# Patient Record
Sex: Male | Born: 1945 | Race: White | Hispanic: No | Marital: Married | Smoking: Former smoker
Health system: Southern US, Community
[De-identification: ages and names within clinical notes are randomized; demographics above are authoritative.]

## PROBLEM LIST (undated history)

## (undated) DIAGNOSIS — C61 Malignant neoplasm of prostate: Secondary | ICD-10-CM

## (undated) DIAGNOSIS — E119 Type 2 diabetes mellitus without complications: Secondary | ICD-10-CM

## (undated) DIAGNOSIS — L03115 Cellulitis of right lower limb: Secondary | ICD-10-CM

## (undated) DIAGNOSIS — N189 Chronic kidney disease, unspecified: Secondary | ICD-10-CM

## (undated) DIAGNOSIS — I10 Essential (primary) hypertension: Secondary | ICD-10-CM

## (undated) DIAGNOSIS — C801 Malignant (primary) neoplasm, unspecified: Secondary | ICD-10-CM

## (undated) DIAGNOSIS — I700 Atherosclerosis of aorta: Secondary | ICD-10-CM

## (undated) DIAGNOSIS — E785 Hyperlipidemia, unspecified: Secondary | ICD-10-CM

## (undated) DIAGNOSIS — I712 Thoracic aortic aneurysm, without rupture: Secondary | ICD-10-CM

## (undated) HISTORY — DX: Cellulitis of right lower limb: L03.115

## (undated) HISTORY — DX: Chronic kidney disease, unspecified: N18.9

## (undated) HISTORY — DX: Essential (primary) hypertension: I10

## (undated) HISTORY — DX: Hyperlipidemia, unspecified: E78.5

## (undated) HISTORY — DX: Type 2 diabetes mellitus without complications: E11.9

---

## 1998-03-02 ENCOUNTER — Emergency Department (HOSPITAL_COMMUNITY): Admission: EM | Admit: 1998-03-02 | Discharge: 1998-03-02 | Payer: Self-pay | Admitting: Emergency Medicine

## 2003-09-09 ENCOUNTER — Encounter: Admission: RE | Admit: 2003-09-09 | Discharge: 2003-09-09 | Payer: Self-pay | Admitting: Family Medicine

## 2003-10-11 ENCOUNTER — Ambulatory Visit (HOSPITAL_COMMUNITY): Admission: RE | Admit: 2003-10-11 | Discharge: 2003-10-11 | Payer: Self-pay | Admitting: Orthopedic Surgery

## 2003-10-11 ENCOUNTER — Ambulatory Visit (HOSPITAL_BASED_OUTPATIENT_CLINIC_OR_DEPARTMENT_OTHER): Admission: RE | Admit: 2003-10-11 | Discharge: 2003-10-11 | Payer: Self-pay | Admitting: Orthopedic Surgery

## 2005-08-30 ENCOUNTER — Encounter: Admission: RE | Admit: 2005-08-30 | Discharge: 2005-08-30 | Payer: Self-pay | Admitting: Family Medicine

## 2006-03-07 ENCOUNTER — Ambulatory Visit: Payer: Self-pay | Admitting: Family Medicine

## 2006-06-06 ENCOUNTER — Ambulatory Visit: Payer: Self-pay | Admitting: Family Medicine

## 2006-06-06 LAB — CONVERTED CEMR LAB
ALT: 29 units/L (ref 0–40)
AST: 29 units/L (ref 0–37)
BUN: 13 mg/dL (ref 6–23)
CO2: 27 meq/L (ref 19–32)
Calcium: 9.3 mg/dL (ref 8.4–10.5)
Chloride: 108 meq/L (ref 96–112)
Chol/HDL Ratio, serum: 4.9
Cholesterol: 184 mg/dL (ref 0–200)
Creatinine, Ser: 0.9 mg/dL (ref 0.4–1.5)
Creatinine,U: 148.2 mg/dL
GFR calc non Af Amer: 92 mL/min
Glomerular Filtration Rate, Af Am: 112 mL/min/{1.73_m2}
Glucose, Bld: 102 mg/dL — ABNORMAL HIGH (ref 70–99)
HDL: 37.9 mg/dL — ABNORMAL LOW (ref >39.0)
Hgb A1c MFr Bld: 7 % — ABNORMAL HIGH (ref 4.6–6.0)
LDL Cholesterol: 107 mg/dL — ABNORMAL HIGH (ref 0–99)
Microalb Creat Ratio: 18.2 mg/g (ref 0.0–30.0)
Microalb, Ur: 2.7 mg/dL — ABNORMAL HIGH (ref 0.0–1.9)
PSA: 1.61 ng/mL (ref 0.10–4.00)
Potassium: 3.9 meq/L (ref 3.5–5.1)
Sodium: 141 meq/L (ref 135–145)
Testosterone, total: 2.6969 ng/mL — ABNORMAL LOW
Triglyceride fasting, serum: 197 mg/dL — ABNORMAL HIGH (ref 0–149)
VLDL: 39 mg/dL (ref 0–40)

## 2006-06-27 ENCOUNTER — Ambulatory Visit: Payer: Self-pay | Admitting: Family Medicine

## 2006-07-08 ENCOUNTER — Ambulatory Visit: Payer: Self-pay | Admitting: Family Medicine

## 2006-10-06 DIAGNOSIS — E119 Type 2 diabetes mellitus without complications: Secondary | ICD-10-CM | POA: Insufficient documentation

## 2006-10-06 DIAGNOSIS — E785 Hyperlipidemia, unspecified: Secondary | ICD-10-CM | POA: Insufficient documentation

## 2006-10-06 DIAGNOSIS — I10 Essential (primary) hypertension: Secondary | ICD-10-CM | POA: Insufficient documentation

## 2006-10-06 DIAGNOSIS — D239 Other benign neoplasm of skin, unspecified: Secondary | ICD-10-CM | POA: Insufficient documentation

## 2006-10-06 DIAGNOSIS — Z9089 Acquired absence of other organs: Secondary | ICD-10-CM | POA: Insufficient documentation

## 2006-10-06 DIAGNOSIS — S83289A Other tear of lateral meniscus, current injury, unspecified knee, initial encounter: Secondary | ICD-10-CM | POA: Insufficient documentation

## 2006-12-16 ENCOUNTER — Encounter (INDEPENDENT_AMBULATORY_CARE_PROVIDER_SITE_OTHER): Payer: Self-pay | Admitting: Family Medicine

## 2006-12-20 ENCOUNTER — Telehealth (INDEPENDENT_AMBULATORY_CARE_PROVIDER_SITE_OTHER): Payer: Self-pay | Admitting: Family Medicine

## 2006-12-20 LAB — CONVERTED CEMR LAB
Sex Hormone Binding: 27 nmol/L (ref 13–71)
Testosterone Free: 58.2 pg/mL (ref 47.0–244.0)
Testosterone-% Free: 2.2 % (ref 1.6–2.9)
Testosterone: 267.16 ng/dL — ABNORMAL LOW (ref 350–890)

## 2008-09-10 ENCOUNTER — Ambulatory Visit: Admission: RE | Admit: 2008-09-10 | Discharge: 2008-10-24 | Payer: Self-pay | Admitting: Radiation Oncology

## 2008-10-28 ENCOUNTER — Encounter (INDEPENDENT_AMBULATORY_CARE_PROVIDER_SITE_OTHER): Payer: Self-pay | Admitting: Urology

## 2008-10-28 ENCOUNTER — Inpatient Hospital Stay (HOSPITAL_COMMUNITY): Admission: RE | Admit: 2008-10-28 | Discharge: 2008-10-29 | Payer: Self-pay | Admitting: Urology

## 2010-01-17 ENCOUNTER — Emergency Department (HOSPITAL_BASED_OUTPATIENT_CLINIC_OR_DEPARTMENT_OTHER): Admission: EM | Admit: 2010-01-17 | Discharge: 2010-01-17 | Payer: Self-pay | Admitting: Emergency Medicine

## 2010-01-17 ENCOUNTER — Ambulatory Visit: Payer: Self-pay | Admitting: Diagnostic Radiology

## 2010-08-11 NOTE — Progress Notes (Signed)
  Phone Note Outgoing Call   Call placed by: Carley Hammed,  December 20, 2006 8:53 AM Call placed to: Patient Summary of Call: [facility] PHONE,.  Follow-up for Phone Call        SPOKE WITH PT. AND IS AWARE.  ALSO, PT. KNOWS THAT DR. Lashala Laser IS OUT OF OFFICE AND WILL TAKE A LOOK AT THE LABS AND IF ANY THING ELSE NEEDS TO BE DONE I WILL GIVE HIM A CALL. Follow-up by: Carley Hammed,  December 20, 2006 8:56 AM  Additional Follow-up for Phone Call Additional follow up Details #1::        No change regarding testosterone labs Additional Follow-up by: Leone Haven MD,  December 25, 2006 6:42 PM   Additional Follow-up for Phone Call Additional follow up Details #2::    Pt. aware. ...................................................................Carley Hammed  December 26, 2006 9:02 AM  Follow-up by: Carley Hammed,  December 26, 2006 9:02 AM

## 2010-10-21 LAB — GLUCOSE, CAPILLARY
Glucose-Capillary: 125 mg/dL — ABNORMAL HIGH (ref 70–99)
Glucose-Capillary: 140 mg/dL — ABNORMAL HIGH (ref 70–99)
Glucose-Capillary: 141 mg/dL — ABNORMAL HIGH (ref 70–99)
Glucose-Capillary: 150 mg/dL — ABNORMAL HIGH (ref 70–99)
Glucose-Capillary: 157 mg/dL — ABNORMAL HIGH (ref 70–99)
Glucose-Capillary: 163 mg/dL — ABNORMAL HIGH (ref 70–99)
Glucose-Capillary: 164 mg/dL — ABNORMAL HIGH (ref 70–99)
Glucose-Capillary: 176 mg/dL — ABNORMAL HIGH (ref 70–99)
Glucose-Capillary: 183 mg/dL — ABNORMAL HIGH (ref 70–99)
Glucose-Capillary: 185 mg/dL — ABNORMAL HIGH (ref 70–99)
Glucose-Capillary: 191 mg/dL — ABNORMAL HIGH (ref 70–99)
Glucose-Capillary: 202 mg/dL — ABNORMAL HIGH (ref 70–99)
Glucose-Capillary: 258 mg/dL — ABNORMAL HIGH (ref 70–99)

## 2010-10-21 LAB — CBC
HCT: 43.8 % (ref 39.0–52.0)
Hemoglobin: 15.2 g/dL (ref 13.0–17.0)
MCHC: 34.6 g/dL (ref 30.0–36.0)
MCV: 96.3 fL (ref 78.0–100.0)
Platelets: 172 10*3/uL (ref 150–400)
RBC: 4.55 MIL/uL (ref 4.22–5.81)
RDW: 13.1 % (ref 11.5–15.5)
WBC: 6.2 10*3/uL (ref 4.0–10.5)

## 2010-10-21 LAB — HEMOGLOBIN AND HEMATOCRIT, BLOOD
HCT: 38.6 % — ABNORMAL LOW (ref 39.0–52.0)
HCT: 40.7 % (ref 39.0–52.0)
Hemoglobin: 13.5 g/dL (ref 13.0–17.0)
Hemoglobin: 14.1 g/dL (ref 13.0–17.0)

## 2010-10-21 LAB — BASIC METABOLIC PANEL
BUN: 19 mg/dL (ref 6–23)
CO2: 31 mEq/L (ref 19–32)
Calcium: 9.4 mg/dL (ref 8.4–10.5)
Chloride: 96 mEq/L (ref 96–112)
Creatinine, Ser: 0.85 mg/dL (ref 0.4–1.5)
GFR calc Af Amer: >60 mL/min (ref >60)
GFR calc non Af Amer: >60 mL/min (ref >60)
Glucose, Bld: 181 mg/dL — ABNORMAL HIGH (ref 70–99)
Potassium: 3.8 mEq/L (ref 3.5–5.1)
Sodium: 137 mEq/L (ref 135–145)

## 2010-10-21 LAB — TYPE AND SCREEN
ABO/RH(D): B POS
Antibody Screen: NEGATIVE

## 2010-10-21 LAB — ABO/RH: ABO/RH(D): B POS

## 2010-11-24 NOTE — Op Note (Signed)
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   PROCEDURE:  1. Robotic-assisted laparoscopic radical prostatectomy (left-nerve      sparing).  2. Bilateral laparoscopic pelvic lymphadenectomy.   SURGEON:  Raynelle Bring, MD.   ASSISTANT:  Franco Collet, NP.   ANESTHESIA:  General.   COMPLICATIONS:  None.   ESTIMATED BLOOD LOSS:  175 mL.   INTRAVENOUS FLUIDS:  2000 mL of lactated Ringer's.   SPECIMENS:  1. Prostate and seminal vesicles.  2. Right pelvic lymph nodes.  3. Left pelvic lymph nodes.   DISPOSITION OF SPECIMENS:  To pathology.   DRAINS:  1. A 20-French Coude catheter.  2. A #19-Blake pelvic drain.   INDICATION:  Darrell [name] is a 65 year old gentleman with clinically  localized adenocarcinoma of the prostate.  After discussion regarding  management options for treatment, he elected to proceed with surgical  therapy and the above procedures.  The potential risks, complications,  and alternative treatment options were discussed in detail and informed  consent was obtained.   DESCRIPTION OF PROCEDURE:  The patient was taken to the operating room  and a general anesthetic was administered.  He was given preoperative  antibiotics, placed in the dorsal lithotomy position, and prepped and  draped in the usual sterile fashion.  Next, a preoperative time-out was  performed.  A Foley catheter was then inserted into the bladder.  A site  was selected just to the left of  the umbilicus for placement of the  camera port.  This was placed using a standard open-Hassan technique  which allowed entry into the peritoneal cavity under direct vision and  without difficulty.  A 12-mm port was then placed and a pneumoperitoneum  established.  The 0-degree lens was used to inspect the abdomen and  there were noted to be some adhesions between the omentum and the right  abdominal wall.  The left-sided ports were then placed with two 8-mm  ports placed in the left lower quadrant.  A 5-mm port was then placed in  the right upper quadrant and the previously mentioned adhesions were  taken down with laparoscopic scissors.  This exposed the right abdominal  wall and an additional 8-mm robotic port, as well as a 12-mm port were  placed in the right lower quadrant.  All ports were placed under direct  vision and without difficulty.  The surgical cart was then docked.  With  the aid of the cautery scissors, the bladder was reflected posteriorly  allowing entry into the space of Retzius and identification of the  endopelvic fascia  and prostate.  The endopelvic fascia was then incised  from the apex back to the base of the prostate bilaterally and the  underlying levator muscle fibers were swept laterally off the prostate,  thereby isolating the dorsal venous complex.  The dorsal vein was then  stapled and divided with a 45-mm flex ETS stapler.  The bladder neck was  identified with the aid of Foley catheter manipulation and was divided  anteriorly.  This exposed the Foley catheter.  The catheter balloon was  deflated and the catheter was brought into the operative field and used  to retract the prostate anteriorly.  On inspection of the posterior  bladder neck, there was noted to be a small median lobe.  This was  excised and dissection proceeded between the bladder and prostate until  the vasa deferentia and seminal vesicles were identified.  The seminal  vesicles were  identified and dissected down to their tips with care to  control the seminal vesicle arterial blood supply.  The vasa deferentia  were also isolated and divided and all of the structures were then  lifted anteriorly.  The space between the Denonvilliers fascia and the  anterior rectum was then bluntly developed, thereby isolating the  vascular pedicles of the prostate.  The lateral prostatic fascia on the  left side of prostate was then incised allowing the neurovascular bundle  to be released.  A nerve-sparing procedure was, therefore, performed on  this side with Hem-o-lok clips used to ligate the vascular pedicles of  the prostate and the vascular pedicles were then divided with sharp cold  scissor dissection above the level of the neurovascular bundles.  The  neurovascular bundle was swept off the apex of the prostate and urethra  on the left side.  On the right side, a wide non-nerve-sparing procedure  was performed with Hem-o-lok clips placed widely on the vascular pedicle  of the prostate.  The urethra was then identified and sharply divided  allowing the specimen to be disarticulated.  The pelvis was copiously  irrigated and hemostasis was ensured.  There was no evidence of a rectal  injury.  Attention then turned to the right pelvic sidewall.  The  fibrofatty tissue between the external iliac vein, confluence of the  iliac vessels, hypogastric artery, and Cooper's ligament was dissected  free from the pelvic sidewall with care to preserve the obturator nerve.  Hem-o-lok clips were used for lymphostasis and hemostasis.  The specimen  was then passed off for permanent pathologic analysis.  An identical  procedure was then performed on the contralateral side.  Attention then  returned to the pelvis and the urethral anastomosis.  A 2-0 Vicryl  slipknot was placed at the 6 o'clock position between Denonvilliers  fascia, the posterior bladder neck, and the posterior urethra to   reapproximate these structures.  A double-armed 3-0 Monocryl suture was  then used to perform a 360-degree running tension-free anastomosis  between the bladder neck and urethra.  A new 20-French Coude catheter  was then inserted into the bladder and irrigated.  There were no blood  clots within the bladder and the anastomosis appeared to be watertight.  A #19-Blake drain was then brought through the left robotic port and  appropriately positioned in the pelvis.  It was secured to the skin with  a nylon suture.  The surgical cart was then undocked.  The right lateral  12-mm port site was closed with a 0-Vicryl suture, placed with the aid  of the suture passer device.  All remaining ports were removed under  direct vision.  The prostate specimen was removed intact within the  Endopouch retrieval bag.  The fascial opening at the periumbilical port  site was then  closed with a running 0-Vicryl suture.  All port sites were injected  with 0.25 % Marcaine and reapproximated at the skin level with staples.  Sterile dressings were applied.  The patient appeared to tolerate the  procedure well and without complications.  He was able to be extubated  and transferred to the recovery unit in satisfactory condition.      Raynelle Bring, MD  Electronically Signed     LB/MEDQ  D:  10/28/2008  T:  10/28/2008  Job:  [id number]

## 2010-11-27 NOTE — Op Note (Signed)
NAME:  Darrell [name], Darrell [name]                          ACCOUNT NO.:  [id number]   MEDICAL RECORD NO.:  [id number]                   PATIENT TYPE:  AMB   LOCATION:  DSC                                  FACILITY:  [facility]   PHYSICIAN:  Yvette Rack., M.D.             DATE OF BIRTH:  1945-11-23   DATE OF PROCEDURE:  10/11/2003  DATE OF DISCHARGE:                                 OPERATIVE REPORT   INDICATIONS FOR PROCEDURE:  65 year old male with MRI proven symptomatic  right medial meniscal tear thought to be amenable to outpatient surgery.   PREOPERATIVE DIAGNOSIS:  1. Grade 3 chondromalacia of the patella.  2. Medial meniscus tear.  3. Early grade 3 changes medial compartment.   POSTOPERATIVE DIAGNOSIS:  1. Grade 3 chondromalacia of the patella.  2. Medial meniscus tear.  3. Early grade 3 changes medial compartment.   OPERATION:  1. Partial medial meniscectomy (posterior 30-40%).  2. Debridement chondroplasty of patellofemoral joint, medial compartment.   SURGEON:  Lockie Pares, M.D.   ANESTHESIA:  MAC/general.   DESCRIPTION OF PROCEDURE:  Arthroscope through an inferomedial and  inferolateral portal.  The patient had mild to moderate grade 3 changes of  the patella which was debrided separate from the medial compartment, lateral  compartment, lateral meniscus, ACL and PCL normal.  Complex tear of the  posterior horn of the medial meniscus requiring resection of 30-40% of the  meniscus substance.  Mild degenerative change of the compartment, probably  early grade 3 changes on the tibia and femur.  These were very early grade 3  changes, these were debrided, as well.  A medial meniscectomy was carried  out, basket forceps, motorized shaving instruments. The knee drained clear  of fluid.  The portals were closed with nylon.  The knee was infiltrated  with Marcaine and morphine with addition of 40 mg Depo-Medrol.  Taken to the  recovery room in stable condition.                                 Yvette Rack., M.D.    WDC/MEDQ  D:  10/11/2003  T:  10/11/2003  Job:  [id number]

## 2016-12-01 ENCOUNTER — Ambulatory Visit: Payer: Self-pay | Admitting: Physical Therapy

## 2018-02-01 ENCOUNTER — Encounter: Payer: Self-pay | Admitting: *Deleted

## 2018-02-01 ENCOUNTER — Telehealth: Payer: Self-pay | Admitting: *Deleted

## 2018-02-01 DIAGNOSIS — R918 Other nonspecific abnormal finding of lung field: Secondary | ICD-10-CM

## 2018-02-01 NOTE — Telephone Encounter (Signed)
Oncology Nurse Navigator Documentation  Oncology Nurse Navigator Flowsheets 02/01/2018  Navigator Location [facility]  Navigator Encounter Type Telephone/I called Darrell [name].  I updated him on appt to see Dr. [name] on 7/30 labs at 1:00 and see doctor at 1:30.  He verbalized understanding of appt time and place.   Treatment Phase Abnormal Scans  Barriers/Navigation Needs Education;Coordination of Care  Education Other  Interventions Coordination of Care;Education  Coordination of Care Appts  Education Method Verbal  Acuity Level 2  Time Spent with Patient 30

## 2018-02-01 NOTE — Progress Notes (Signed)
Oncology Nurse Navigator Documentation  Oncology Nurse Navigator Flowsheets 02/01/2018  Navigator Location [facility]  Referral date to RadOnc/MedOnc 02/01/2018  Navigator Encounter Type Telephone/Dr. [name] received a referral on Darrell [name] today from [facility].  Dr. [name] would like to see patient on 02/07/18 at 1:30.  I called today but was unable to reach. I did leave vm message for him to call me with my name and phone number.   Treatment Phase Abnormal Scans  Barriers/Navigation Needs Coordination of Care  Interventions Coordination of Care  Coordination of Care Other  Acuity Level 2  Time Spent with Patient 30

## 2018-02-07 ENCOUNTER — Encounter: Payer: Self-pay | Admitting: Internal Medicine

## 2018-02-07 ENCOUNTER — Inpatient Hospital Stay (HOSPITAL_BASED_OUTPATIENT_CLINIC_OR_DEPARTMENT_OTHER): Payer: Medicare Other | Admitting: Internal Medicine

## 2018-02-07 ENCOUNTER — Inpatient Hospital Stay: Payer: Medicare Other | Attending: Internal Medicine

## 2018-02-07 ENCOUNTER — Telehealth: Payer: Self-pay | Admitting: Internal Medicine

## 2018-02-07 DIAGNOSIS — I129 Hypertensive chronic kidney disease with stage 1 through stage 4 chronic kidney disease, or unspecified chronic kidney disease: Secondary | ICD-10-CM | POA: Diagnosis not present

## 2018-02-07 DIAGNOSIS — E1122 Type 2 diabetes mellitus with diabetic chronic kidney disease: Secondary | ICD-10-CM | POA: Insufficient documentation

## 2018-02-07 DIAGNOSIS — C3411 Malignant neoplasm of upper lobe, right bronchus or lung: Secondary | ICD-10-CM | POA: Diagnosis not present

## 2018-02-07 DIAGNOSIS — N189 Chronic kidney disease, unspecified: Secondary | ICD-10-CM

## 2018-02-07 DIAGNOSIS — E785 Hyperlipidemia, unspecified: Secondary | ICD-10-CM | POA: Insufficient documentation

## 2018-02-07 DIAGNOSIS — Z87891 Personal history of nicotine dependence: Secondary | ICD-10-CM | POA: Insufficient documentation

## 2018-02-07 DIAGNOSIS — C3491 Malignant neoplasm of unspecified part of right bronchus or lung: Secondary | ICD-10-CM

## 2018-02-07 DIAGNOSIS — R918 Other nonspecific abnormal finding of lung field: Secondary | ICD-10-CM

## 2018-02-07 DIAGNOSIS — Z7189 Other specified counseling: Secondary | ICD-10-CM | POA: Insufficient documentation

## 2018-02-07 DIAGNOSIS — C3490 Malignant neoplasm of unspecified part of unspecified bronchus or lung: Secondary | ICD-10-CM

## 2018-02-07 LAB — CBC WITH DIFFERENTIAL (CANCER CENTER ONLY)
Basophils Absolute: 0.1 10*3/uL (ref 0.0–0.1)
Basophils Relative: 2 %
Eosinophils Absolute: 0.5 10*3/uL (ref 0.0–0.5)
Eosinophils Relative: 9 %
HCT: 32.1 % — ABNORMAL LOW (ref 38.4–49.9)
Hemoglobin: 10.6 g/dL — ABNORMAL LOW (ref 13.0–17.1)
Lymphocytes Relative: 22 %
Lymphs Abs: 1.3 10*3/uL (ref 0.9–3.3)
MCH: 30.6 pg (ref 27.2–33.4)
MCHC: 33 g/dL (ref 32.0–36.0)
MCV: 92.8 fL (ref 79.3–98.0)
Monocytes Absolute: 0.5 10*3/uL (ref 0.1–0.9)
Monocytes Relative: 8 %
Neutro Abs: 3.5 10*3/uL (ref 1.5–6.5)
Neutrophils Relative %: 59 %
Platelet Count: 249 10*3/uL (ref 140–400)
RBC: 3.46 MIL/uL — ABNORMAL LOW (ref 4.20–5.82)
RDW: 15.1 % — ABNORMAL HIGH (ref 11.0–14.6)
WBC Count: 6 10*3/uL (ref 4.0–10.3)

## 2018-02-07 LAB — CMP (CANCER CENTER ONLY)
ALT: 10 U/L (ref 0–44)
AST: 16 U/L (ref 15–41)
Albumin: 3.7 g/dL (ref 3.5–5.0)
Alkaline Phosphatase: 41 U/L (ref 38–126)
Anion gap: 9 (ref 5–15)
BUN: 32 mg/dL — ABNORMAL HIGH (ref 8–23)
CO2: 28 mmol/L (ref 22–32)
Calcium: 9.9 mg/dL (ref 8.9–10.3)
Chloride: 100 mmol/L (ref 98–111)
Creatinine: 1.57 mg/dL — ABNORMAL HIGH (ref 0.61–1.24)
GFR, Est AFR Am: 50 mL/min — ABNORMAL LOW (ref >60)
GFR, Estimated: 43 mL/min — ABNORMAL LOW (ref >60)
Glucose, Bld: 96 mg/dL (ref 70–99)
Potassium: 4.2 mmol/L (ref 3.5–5.1)
Sodium: 137 mmol/L (ref 135–145)
Total Bilirubin: 0.4 mg/dL (ref 0.3–1.2)
Total Protein: 7 g/dL (ref 6.5–8.1)

## 2018-02-07 NOTE — Progress Notes (Signed)
Darrell [name] Telephone:([phone]   Fax:([phone]  CONSULT NOTE  REFERRING PHYSICIAN: Dr. Stanford Breed, [place]:  72 years old white male recently diagnosed with lung cancer.  HPI Darrell [name] is a 72 y.o. male with past medical history significant for hypertension, diabetes mellitus, chronic kidney disease, dyslipidemia, right leg cellulitis as well as long history for smoking but quit in 1988.  The patient is originally from [place] but he was working in [place] when he started having several episodes of coughing blood.  He went to the emergency department and had chest x-ray performed on 01/31/2018 and that showed right hilar fullness and nodularity suspicious for adenopathy or lung mass.  This was followed by CT scan of the chest on the same day and it showed a large right hilar mass measuring at least 6.0 cm in size.  The mass appears to be occluding the right upper lobe bronchus with associated right upper lobe volume loss and atelectasis highly suspicious for malignant neoplasm.  There was minimal patchy airspace density in the left lower lobe which is nonspecific but some early infectious etiology was not excluded. The patient was seen by Dr. Lyman Speller and on January 31, 2018 he underwent bronchoscopy with biopsy of the right hilar mass. The final pathology [id number]) from [facility] pathology, [place], [place] showed nonkeratinizing squamous cell carcinoma, moderately differentiated.  The immunohistochemical stains showed the cells were positive for CK 5 and p63 but negative for P 40, TTF-1 and CK7. The patient requested to return back to [facility] for evaluation and recommendation regarding his condition. When seen today he is feeling better with no concerning complaints except for sore throat from the bronchoscopy.  He also has cough productive of clear sputum.  He has no chest  pain, shortness of breath or any current hemoptysis.  He denied having any nausea, vomiting, diarrhea or constipation.  He has no headache or visual changes.  He denied having any weight loss or night sweats. Family history significant for father with coronary artery disease, mother still alive and had breast cancer. The patient is married and has 2 biological children, one adopted and 1 step child.  The patient works in [employer], [employer].  He has a history for smoking up to 1.5 pack/day for around 38 years and quit in 1988.  He also drinks a couple of glasses of wine every day.  He has no history of drug abuse. HPI  Past Medical History:  Diagnosis Date  . Cellulitis of right lower extremity   . Chronic kidney disease   . Diabetes mellitus without complication ([place])   . Dyslipidemia   . Hypertension     History reviewed. No pertinent surgical history.  Family History  Problem Relation Age of Onset  . Breast cancer Mother   . CAD Father     Social History Social History   Tobacco Use  . Smoking status: Former Smoker    Packs/day: 1.50    Years: 20.00    Pack years: 30.00    Types: Cigarettes    Last attempt to quit: 1988    Years since quitting: 31.5  . Smokeless tobacco: Never Used  Substance Use Topics  . Alcohol use: Yes    Comment: occasionally  . Drug use: Never    Allergies  Allergen Reactions  . Lisinopril Cough    Cough     No current outpatient medications  on file.   No current facility-administered medications for this visit.     Review of Systems  Constitutional: negative Eyes: negative Ears, nose, mouth, throat, and face: negative Respiratory: positive for cough Cardiovascular: negative Gastrointestinal: negative Genitourinary:negative Integument/breast: negative Hematologic/lymphatic: negative Musculoskeletal:negative Neurological: negative Behavioral/Psych: negative Endocrine: negative Allergic/Immunologic:  negative  Physical Exam  FFM:BWGYK, healthy, no distress, well nourished and well developed SKIN: skin color, texture, turgor are normal, no rashes or significant lesions HEAD: Normocephalic, No masses, lesions, tenderness or abnormalities EYES: normal, PERRLA, Conjunctiva are pink and non-injected EARS: External ears normal, Canals clear OROPHARYNX:no exudate, no erythema and lips, buccal mucosa, and tongue normal  NECK: supple, no adenopathy, no JVD LYMPH:  no palpable lymphadenopathy, no hepatosplenomegaly LUNGS: clear to auscultation , and palpation HEART: regular rate & rhythm, no murmurs and no gallops ABDOMEN:abdomen soft, non-tender, normal bowel sounds and no masses or organomegaly BACK: Back symmetric, no curvature., No CVA tenderness EXTREMITIES:no joint deformities, effusion, or inflammation, no edema  NEURO: alert & oriented x 3 with fluent speech, no focal motor/sensory deficits  PERFORMANCE STATUS: ECOG 0  LABORATORY DATA: Lab Results  Component Value Date   WBC 6.0 02/07/2018   HGB 10.6 (L) 02/07/2018   HCT 32.1 (L) 02/07/2018   MCV 92.8 02/07/2018   PLT 249 02/07/2018      Chemistry      Component Value Date/Time   NA 137 10/21/2008 1325   K 3.8 10/21/2008 1325   CL 96 10/21/2008 1325   CO2 31 10/21/2008 1325   BUN 19 10/21/2008 1325   CREATININE 0.85 10/21/2008 1325      Component Value Date/Time   CALCIUM 9.4 10/21/2008 1325   AST 29 06/06/2006 0946   ALT 29 06/06/2006 0946       RADIOGRAPHIC STUDIES: No results found.  ASSESSMENT: This is a very pleasant 72 years old white male recently diagnosed with at least stage IIb/IIIa (T2b, N0/N2, M0), non-small cell lung cancer, squamous cell carcinoma diagnosed in July 2019 and presented with large right hilar mass with questionable mediastinal invasion, pending further staging work-up.   PLAN: I had a lengthy discussion with the patient and his wife today about his current disease stage, prognosis  and treatment options. I personally and independently reviewed the scan images. I recommended for the patient to complete the staging work-up by ordering a PET scan as well as MRI of the brain to rule out metastatic disease. I will also order pulmonary function test. His tumor centrally located and he may require right pneumonectomy if surgical resection is considered. I will arrange for the patient to come to the multidisciplinary thoracic oncology clinic next week for evaluation by cardiothoracic surgery, radiation oncology as well as myself after completing the staging work-up for further recommendation regarding treatment of his condition. He likely would benefit from a course of concurrent chemoradiation if the surgical resection would be a pneumonectomy. The patient is currently asymptomatic and he has no more hemoptysis but he was advised to go immediately to the emergency department or call my office if he has any concerning symptoms in the interval. The patient and his wife agreed to the current plan. The patient voices understanding of current disease status and treatment options and is in agreement with the current care plan.  All questions were answered. The patient knows to call the clinic with any problems, questions or concerns. We can certainly see the patient much sooner if necessary.  Thank you so much for allowing me  to participate in the care of Darrell [name]. I will continue to follow up the patient with you and assist in his care.  I spent 40 minutes counseling the patient face to face. The total time spent in the appointment was 60 minutes.  Disclaimer: This note was dictated with voice recognition software. Similar sounding words can inadvertently be transcribed and may not be corrected upon review.   Eilleen Kempf February 07, 2018, 1:36 PM

## 2018-02-07 NOTE — Telephone Encounter (Signed)
No appts scheduled- pt will be scheduled by [place] -

## 2018-02-08 ENCOUNTER — Telehealth: Payer: Self-pay | Admitting: *Deleted

## 2018-02-08 ENCOUNTER — Encounter: Payer: Self-pay | Admitting: *Deleted

## 2018-02-08 NOTE — Telephone Encounter (Signed)
Oncology Nurse Navigator Documentation  Oncology Nurse Navigator Flowsheets 02/08/2018  Navigator Location [facility]  Navigator Encounter Type Telephone;Other/I checked on insurance authorization for PET and MRI brain.  MRI Brain has been authorized but not PET.  I called central scheduling and was given an appt time for MRI Brain.  I then called resp care and was given an appt time for PFT's.  I called patient to update on appt.  I was unable to reach but did leave a vm message for him to call with my name and phone number.  I will check with authorization team regarding PET scan.   Telephone Outgoing Call  Treatment Phase Abnormal Scans  Barriers/Navigation Needs Education;Coordination of Care  Education Other  Interventions Coordination of Care;Education  Coordination of Care Appts;Other  Education Method Verbal  Acuity Level 3  Time Spent with Patient 71

## 2018-02-08 NOTE — Progress Notes (Signed)
Mr. Yackley called back and I updated him on appts and pre-procedure instructions.  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 I was told ok as long greater than 6 months.  Patient verified it was been longer than 6 months.

## 2018-02-09 ENCOUNTER — Other Ambulatory Visit: Payer: Self-pay | Admitting: Internal Medicine

## 2018-02-09 ENCOUNTER — Inpatient Hospital Stay
Admission: RE | Admit: 2018-02-09 | Discharge: 2018-02-09 | Disposition: A | Discharge status: Home / Self Care | Payer: Self-pay | Source: Ambulatory Visit | Attending: Internal Medicine | Admitting: Internal Medicine

## 2018-02-09 ENCOUNTER — Ambulatory Visit
Admission: RE | Admit: 2018-02-09 | Discharge: 2018-02-09 | Disposition: A | Discharge status: Home / Self Care | Payer: Self-pay | Source: Ambulatory Visit | Attending: Internal Medicine | Admitting: Internal Medicine

## 2018-02-09 DIAGNOSIS — C801 Malignant (primary) neoplasm, unspecified: Secondary | ICD-10-CM

## 2018-02-13 ENCOUNTER — Telehealth: Payer: Self-pay | Admitting: *Deleted

## 2018-02-13 ENCOUNTER — Ambulatory Visit (HOSPITAL_COMMUNITY)
Admission: RE | Admit: 2018-02-13 | Discharge: 2018-02-13 | Disposition: A | Discharge status: Home / Self Care | Payer: Medicare Other | Source: Ambulatory Visit | Attending: Internal Medicine | Admitting: Internal Medicine

## 2018-02-13 DIAGNOSIS — C3401 Malignant neoplasm of right main bronchus: Secondary | ICD-10-CM | POA: Diagnosis present

## 2018-02-13 DIAGNOSIS — C3490 Malignant neoplasm of unspecified part of unspecified bronchus or lung: Secondary | ICD-10-CM

## 2018-02-13 LAB — PULMONARY FUNCTION TEST
DL/VA % pred: 97 %
DL/VA: 4.43 ml/min/mmHg/L
DLCO cor % pred: 65 %
DLCO cor: 20.34 ml/min/mmHg
DLCO unc % pred: 56 %
DLCO unc: 17.61 ml/min/mmHg
FEF 25-75 Post: 2.12 L/sec
FEF 25-75 Pre: 2.17 L/sec
FEF2575-%Change-Post: -2 %
FEF2575-%Pred-Post: 88 %
FEF2575-%Pred-Pre: 90 %
FEV1-%Change-Post: -1 %
FEV1-%Pred-Post: 74 %
FEV1-%Pred-Pre: 75 %
FEV1-Post: 2.34 L
FEV1-Pre: 2.37 L
FEV1FVC-%Change-Post: 1 %
FEV1FVC-%Pred-Pre: 106 %
FEV6-%Change-Post: -2 %
FEV6-%Pred-Post: 72 %
FEV6-%Pred-Pre: 74 %
FEV6-Post: 2.91 L
FEV6-Pre: 2.98 L
FEV6FVC-%Change-Post: 0 %
FEV6FVC-%Pred-Post: 106 %
FEV6FVC-%Pred-Pre: 106 %
FVC-%Change-Post: -2 %
FVC-%Pred-Post: 68 %
FVC-%Pred-Pre: 70 %
FVC-Post: 2.91 L
FVC-Pre: 2.99 L
Post FEV1/FVC ratio: 80 %
Post FEV6/FVC ratio: 100 %
Pre FEV1/FVC ratio: 79 %
Pre FEV6/FVC Ratio: 100 %
RV % pred: 126 %
RV: 3.02 L
TLC % pred: 88 %
TLC: 6.02 L

## 2018-02-13 MED ORDER — ALBUTEROL SULFATE (2.5 MG/3ML) 0.083% IN NEBU
2.5000 mg | INHALATION_SOLUTION | Freq: Once | RESPIRATORY_TRACT | Status: AC
  Administered 2018-02-13: 2.5 mg via RESPIRATORY_TRACT

## 2018-02-13 NOTE — Telephone Encounter (Signed)
Oncology Nurse Navigator Documentation  Oncology Nurse Navigator Flowsheets 02/13/2018  Navigator Location [facility]  Navigator Encounter Type Telephone/I followed up on Darrell [name] PET scan authorization.  I called central scheduling and was given an appt time and pre-procedure instructions. I called patient and updated him on appt time, place and pre-procedure instructions. He verbalized understanding of appt   Telephone Incoming Call;Outgoing Call  Treatment Phase Abnormal Scans  Barriers/Navigation Needs Education;Coordination of Care  Education Other  Interventions Coordination of Care;Education  Coordination of Care Appts  Education Method Verbal  Acuity Level 2  Time Spent with Patient 30

## 2018-02-15 ENCOUNTER — Other Ambulatory Visit: Payer: Self-pay | Admitting: Medical Oncology

## 2018-02-15 ENCOUNTER — Ambulatory Visit (HOSPITAL_COMMUNITY)
Admission: RE | Admit: 2018-02-15 | Discharge: 2018-02-15 | Disposition: A | Discharge status: Home / Self Care | Payer: Medicare Other | Source: Ambulatory Visit | Attending: Internal Medicine | Admitting: Internal Medicine

## 2018-02-15 DIAGNOSIS — C3490 Malignant neoplasm of unspecified part of unspecified bronchus or lung: Secondary | ICD-10-CM

## 2018-02-15 MED ORDER — GADOBENATE DIMEGLUMINE 529 MG/ML IV SOLN
20.0000 mL | Freq: Once | INTRAVENOUS | Status: AC | PRN
  Administered 2018-02-15: 20 mL via INTRAVENOUS

## 2018-02-16 ENCOUNTER — Telehealth: Payer: Self-pay | Admitting: Internal Medicine

## 2018-02-16 ENCOUNTER — Encounter: Payer: Self-pay | Admitting: Rehabilitation

## 2018-02-16 ENCOUNTER — Ambulatory Visit: Payer: Medicare Other | Attending: Internal Medicine | Admitting: Rehabilitation

## 2018-02-16 ENCOUNTER — Other Ambulatory Visit: Payer: Self-pay | Admitting: Internal Medicine

## 2018-02-16 ENCOUNTER — Encounter
Admission: RE | Admit: 2018-02-16 | Discharge: 2018-02-16 | Disposition: A | Discharge status: Home / Self Care | Payer: Medicare Other | Source: Ambulatory Visit | Attending: Internal Medicine | Admitting: Internal Medicine

## 2018-02-16 ENCOUNTER — Other Ambulatory Visit: Payer: Self-pay | Admitting: *Deleted

## 2018-02-16 ENCOUNTER — Other Ambulatory Visit: Payer: Self-pay

## 2018-02-16 ENCOUNTER — Encounter: Payer: Self-pay | Admitting: *Deleted

## 2018-02-16 ENCOUNTER — Institutional Professional Consult (permissible substitution) (INDEPENDENT_AMBULATORY_CARE_PROVIDER_SITE_OTHER): Payer: Medicare Other | Admitting: Cardiothoracic Surgery

## 2018-02-16 ENCOUNTER — Inpatient Hospital Stay: Payer: Medicare Other | Attending: Internal Medicine

## 2018-02-16 ENCOUNTER — Encounter: Payer: Self-pay | Admitting: Internal Medicine

## 2018-02-16 ENCOUNTER — Ambulatory Visit
Admission: RE | Admit: 2018-02-16 | Discharge: 2018-02-16 | Disposition: A | Discharge status: Home / Self Care | Payer: Medicare Other | Source: Ambulatory Visit | Attending: Radiation Oncology | Admitting: Radiation Oncology

## 2018-02-16 ENCOUNTER — Inpatient Hospital Stay (HOSPITAL_BASED_OUTPATIENT_CLINIC_OR_DEPARTMENT_OTHER): Payer: Medicare Other | Admitting: Internal Medicine

## 2018-02-16 VITALS — BP 145/68 | HR 67 | Temp 97.7°F | Resp 18 | Wt 261.0 lb

## 2018-02-16 VITALS — BP 145/68 | HR 67 | Temp 97.7°F | Resp 18 | Ht 68.0 in | Wt 261.0 lb

## 2018-02-16 DIAGNOSIS — Z5111 Encounter for antineoplastic chemotherapy: Secondary | ICD-10-CM | POA: Insufficient documentation

## 2018-02-16 DIAGNOSIS — C3490 Malignant neoplasm of unspecified part of unspecified bronchus or lung: Secondary | ICD-10-CM | POA: Diagnosis present

## 2018-02-16 DIAGNOSIS — E1122 Type 2 diabetes mellitus with diabetic chronic kidney disease: Secondary | ICD-10-CM | POA: Diagnosis not present

## 2018-02-16 DIAGNOSIS — C3401 Malignant neoplasm of right main bronchus: Secondary | ICD-10-CM | POA: Diagnosis present

## 2018-02-16 DIAGNOSIS — Z7982 Long term (current) use of aspirin: Secondary | ICD-10-CM | POA: Insufficient documentation

## 2018-02-16 DIAGNOSIS — Z7409 Other reduced mobility: Secondary | ICD-10-CM | POA: Diagnosis present

## 2018-02-16 DIAGNOSIS — Z794 Long term (current) use of insulin: Secondary | ICD-10-CM

## 2018-02-16 DIAGNOSIS — K1379 Other lesions of oral mucosa: Secondary | ICD-10-CM | POA: Diagnosis not present

## 2018-02-16 DIAGNOSIS — E785 Hyperlipidemia, unspecified: Secondary | ICD-10-CM | POA: Diagnosis not present

## 2018-02-16 DIAGNOSIS — C3491 Malignant neoplasm of unspecified part of right bronchus or lung: Secondary | ICD-10-CM

## 2018-02-16 DIAGNOSIS — Z87891 Personal history of nicotine dependence: Secondary | ICD-10-CM | POA: Insufficient documentation

## 2018-02-16 DIAGNOSIS — Z79899 Other long term (current) drug therapy: Secondary | ICD-10-CM

## 2018-02-16 DIAGNOSIS — Z803 Family history of malignant neoplasm of breast: Secondary | ICD-10-CM | POA: Diagnosis not present

## 2018-02-16 DIAGNOSIS — N189 Chronic kidney disease, unspecified: Secondary | ICD-10-CM | POA: Diagnosis not present

## 2018-02-16 DIAGNOSIS — I700 Atherosclerosis of aorta: Secondary | ICD-10-CM | POA: Diagnosis not present

## 2018-02-16 DIAGNOSIS — Z7189 Other specified counseling: Secondary | ICD-10-CM

## 2018-02-16 DIAGNOSIS — C3411 Malignant neoplasm of upper lobe, right bronchus or lung: Secondary | ICD-10-CM

## 2018-02-16 DIAGNOSIS — I10 Essential (primary) hypertension: Secondary | ICD-10-CM

## 2018-02-16 DIAGNOSIS — I129 Hypertensive chronic kidney disease with stage 1 through stage 4 chronic kidney disease, or unspecified chronic kidney disease: Secondary | ICD-10-CM | POA: Diagnosis not present

## 2018-02-16 LAB — CBC WITH DIFFERENTIAL (CANCER CENTER ONLY)
Basophils Absolute: 0.1 10*3/uL (ref 0.0–0.1)
Basophils Relative: 2 %
Eosinophils Absolute: 0.5 10*3/uL (ref 0.0–0.5)
Eosinophils Relative: 8 %
HCT: 33.2 % — ABNORMAL LOW (ref 38.4–49.9)
Hemoglobin: 10.9 g/dL — ABNORMAL LOW (ref 13.0–17.1)
Lymphocytes Relative: 19 %
Lymphs Abs: 1.2 10*3/uL (ref 0.9–3.3)
MCH: 30.5 pg (ref 27.2–33.4)
MCHC: 32.8 g/dL (ref 32.0–36.0)
MCV: 93 fL (ref 79.3–98.0)
Monocytes Absolute: 0.2 10*3/uL (ref 0.1–0.9)
Monocytes Relative: 4 %
Neutro Abs: 4 10*3/uL (ref 1.5–6.5)
Neutrophils Relative %: 67 %
Platelet Count: 220 10*3/uL (ref 140–400)
RBC: 3.57 MIL/uL — ABNORMAL LOW (ref 4.20–5.82)
RDW: 15.2 % — ABNORMAL HIGH (ref 11.0–14.6)
WBC Count: 6 10*3/uL (ref 4.0–10.3)

## 2018-02-16 LAB — CMP (CANCER CENTER ONLY)
ALT: 9 U/L (ref 0–44)
AST: 15 U/L (ref 15–41)
Albumin: 3.6 g/dL (ref 3.5–5.0)
Alkaline Phosphatase: 53 U/L (ref 38–126)
Anion gap: 10 (ref 5–15)
BUN: 36 mg/dL — ABNORMAL HIGH (ref 8–23)
CO2: 25 mmol/L (ref 22–32)
Calcium: 9.6 mg/dL (ref 8.9–10.3)
Chloride: 103 mmol/L (ref 98–111)
Creatinine: 1.77 mg/dL — ABNORMAL HIGH (ref 0.61–1.24)
GFR, Est AFR Am: 43 mL/min — ABNORMAL LOW (ref >60)
GFR, Estimated: 37 mL/min — ABNORMAL LOW (ref >60)
Glucose, Bld: 149 mg/dL — ABNORMAL HIGH (ref 70–99)
Potassium: 3.5 mmol/L (ref 3.5–5.1)
Sodium: 138 mmol/L (ref 135–145)
Total Bilirubin: 0.5 mg/dL (ref 0.3–1.2)
Total Protein: 7 g/dL (ref 6.5–8.1)

## 2018-02-16 LAB — GLUCOSE, CAPILLARY: Glucose-Capillary: 121 mg/dL — ABNORMAL HIGH (ref 70–99)

## 2018-02-16 MED ORDER — LIDOCAINE-PRILOCAINE 2.5-2.5 % EX CREA: 1.0000 "application " | TOPICAL_CREAM | CUTANEOUS | 0 refills | Status: AC | PRN

## 2018-02-16 MED ORDER — FLUDEOXYGLUCOSE F - 18 (FDG) INJECTION
14.5000 | Freq: Once | INTRAVENOUS | Status: AC | PRN
  Administered 2018-02-16: 14.5 via INTRAVENOUS

## 2018-02-16 MED ORDER — PROCHLORPERAZINE MALEATE 10 MG PO TABS: 10.0000 mg | ORAL_TABLET | Freq: Four times a day (QID) | ORAL | 0 refills | Status: DC | PRN

## 2018-02-16 NOTE — Patient Instructions (Signed)
Handouts on pursed lip breathing, why exercise during treatment, activity modification, and to continue current walking program

## 2018-02-16 NOTE — Therapy (Signed)
[place], [place], [address] Phone: [phone]   Fax:  [phone]  Physical Therapy Evaluation  Patient Details  Name: Darrell [name] MRN: [id number] Date of Birth: Aug 15, 1945 Referring Provider: Earlie Server   Encounter Date: 02/16/2018  PT End of Session - 02/16/18 1333    Visit Number  1    Number of Visits  1    PT Start Time  8315    PT Stop Time  1323    PT Time Calculation (min)  18 min    Activity Tolerance  Patient tolerated treatment well    Behavior During Therapy  [facility] for tasks assessed/performed       Past Medical History:  Diagnosis Date  . Cellulitis of right lower extremity   . Chronic kidney disease   . Diabetes mellitus without complication ([place])   . Dyslipidemia   . Hypertension     History reviewed. No pertinent surgical history.  There were no vitals filed for this visit.   Subjective Assessment - 02/16/18 1326    Subjective  Pt presents to [place] to get information from all providers    Patient is accompained by:  Family member    Pertinent History  HTN, DM, kidney disease, smoker until 1988, stage IIb/IIIa squamous cell carcinoma NSCLC    Currently in Pain?  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   No    Is the patient reluctant to leave their home because of a fear of falling?   No      Home Film/video editor residence    Living Arrangements  Spouse/significant other     Available Help at Discharge  Family    Type of [place]      Prior Function   Level of [place] Requirements  works at [employer]  walks 1/31mile 2x per day      Cognition   Overall Cognitive Status  Within Functional Limits for tasks assessed      Coordination   Gross Motor Movements are Fluid and Coordinated  Yes      Functional Tests   Functional tests  Sit to Stand      Sit to Stand   Comments  x 10 in 30sec      Posture/Postural Control   Posture/Postural Control  Postural limitations    Postural Limitations  Rounded Shoulders;Forward head;Increased thoracic kyphosis      ROM / Strength   AROM / PROM / Strength  Strength      Strength   Overall Strength Comments  seated strength 4+/5 globally bilLEs                Objective measurements completed on  examination: See above findings.              PT Education - 02/16/18 1333    Education Details  Handouts on pursed lip breathing, why exercise during treatment, activity modification, and to continue current walking program     Person(s) Educated  Patient;Spouse;Parent(s)    Methods  Explanation;Demonstration;Verbal cues;Handout    Comprehension  Verbalized understanding;Returned demonstration            Lung Clinic Goals - 02/16/18 1339      Patient will be able to verbalize understanding of the benefit of exercise to decrease fatigue.   Status  Achieved      Patient will be able to verbalize the importance of posture.   Status  Achieved      Patient will be able to demonstrate diaphragmatic breathing for improved lung function.   Status  Achieved      Patient will be able to verbalize understanding of the role of physical therapy to prevent functional decline and who to contact if physical therapy is needed.   Status  Achieved           Plan - 02/16/18 1333    Clinical Impression Statement  Pt presents to [facility] today to learn about  treatment planning for his Rt sided squamous cell lung cancer.  He currently walks 2x per day for a total of 1/2 a mile with his wife. He has good to excellent strength in the LEs but did have a poor to fair sit to stand of 10x in 30 seconds with SOB of 3/10 rating. He was educated on proper pursed lip breathing, continuing his walking program, and how to utilize rehab services if needed    History and Personal Factors relevant to plan of care:  HTN, DM, kidney dHTN, DM, kidney disease, smoker until 1988, stage IIb/IIIa squamous cell carcinoma NSCLCisease, smoker until 1988, stage IIb/IIIa squamous cell carcinoma NSCLC    Clinical Presentation  Evolving    Clinical Presentation due to:  new cancer status    Clinical Decision Making  Low    PT Frequency  One time visit    PT Treatment/Interventions  Patient/family education    Consulted and Agree with Plan of Care  Patient       Patient will benefit from skilled therapeutic intervention in order to improve the following deficits and impairments:     Visit Diagnosis: Decreased functional mobility and endurance  Stage III squamous cell carcinoma of right lung [facility])     Problem List Patient Active Problem List   Diagnosis Date Noted  . Encounter for antineoplastic chemotherapy 02/16/2018  . Stage III squamous cell carcinoma of right lung ([place]) 02/07/2018  . Goals of care, counseling/discussion 02/07/2018  . ANGIOKERATOMA, BLEEDING 10/06/2006  . DM, UNCOMPLICATED, TYPE II, UNCONTROLLED 10/06/2006  . HYPERLIPIDEMIA 10/06/2006  . HYPERTENSION, BENIGN 10/06/2006  . LATERAL MENISCUS TEAR, RIGHT 10/06/2006  . CHOLECYSTECTOMY, HX OF 10/06/2006    Shan Levans, PT 02/16/2018, 1:40 PM  [place], [place], [address] Phone: [phone]   Fax:  [phone]  Name: DOMINGOS RIGGI MRN: [id number] Date of Birth: 08/12/1945

## 2018-02-16 NOTE — Progress Notes (Signed)
[address]       [place] [address]             [phone]                    Darrell [name] [place] Medical Record #[id number] Date of Birth: 12/14/1945  Referring: Curt Bears, MD Primary Care: Robyne Peers, MD Primary Cardiologist: No primary care provider on file.  Chief Complaint:   Carcinoma of the right lung with hemoptysis  History of Present Illness:    Darrell [name] 72 y.o. male is seen in the office  today for evaluation of squamous cell carcinoma lung involving the right hilum right mainstem bronchus and takeoff of the right upper lobe.  Patient presented with new onset of hemoptysis while working in [place].  While there he underwent bronchoscopy, repeat bronchoscopy and coring of obstructing mass in his right mainstem bronchus by Dr. Karalee Height in [place].  He now presents to the multidisciplinary thoracic oncology clinic to discuss treatment options    The patient was seen by Dr. Lyman Speller and on January 31, 2018 he underwent bronchoscopy with biopsy of the right hilar mass. The final pathology [id number]) from [facility], [place], [place] showed nonkeratinizing squamous cell carcinoma, moderately differentiated.  The immunohistochemical stains showed the cells were positive for CK 5 and p63 but negative for P 40, TTF-1 and CK7.    Current Activity/ Functional Status:  Patient is independent with mobility/ambulation, transfers, ADL's, IADL's.   Zubrod Score: At the time of surgery this patient's most appropriate activity status/level should be described as: []     0    Normal activity, no symptoms []     1    Restricted in physical strenuous activity but ambulatory, able to do out light work []     2    Ambulatory and capable of self care, unable to do work activities, up and about               >50 % of waking hours                              []     3    Only limited self care, in bed greater  than 50% of waking hours []     4    Completely disabled, no self care, confined to bed or chair []     5    Moribund   Past Medical History:  Diagnosis Date  . Cellulitis of right lower extremity   . Chronic kidney disease   . Diabetes mellitus without complication ([place])   . Dyslipidemia   . Hypertension     No past surgical history on file.  Family History  Problem Relation Age of Onset  . Breast cancer Mother   . CAD Father      Social History   Tobacco Use  Smoking Status Former Smoker  . Packs/day: 1.50  . Years: 20.00  . Pack years: 30.00  . Types: Cigarettes  . Last attempt to quit: 1988  . Years since quitting: 31.6  Smokeless Tobacco Never Used    Social History   Substance and Sexual Activity  Alcohol Use Yes   Comment: occasionally     Allergies  Allergen Reactions  . Lisinopril Cough    Cough     Current Outpatient Medications  Medication Sig Dispense Refill  .  aspirin EC 325 MG tablet Take by mouth.    . ASTAXANTHIN PO Take by mouth.    . Blood Glucose Monitoring Suppl (ONE TOUCH ULTRA MINI) w/Device KIT 1 each by Other route once. USE AS DIRECTED    . carvedilol (COREG) 12.5 MG tablet Take by mouth.    Verneita Griffes Bark POWD Take by mouth.    . cloNIDine (CATAPRES) 0.1 MG tablet Take by mouth.    . escitalopram (LEXAPRO) 10 MG tablet     . fenofibrate 160 MG tablet     . Glucosamine Sulfate 1000 MG TABS Take by mouth.    . hydrochlorothiazide (HYDRODIURIL) 25 MG tablet TAKE 1 TABLET BY MOUTH  DAILY    . Insulin Detemir (LEVEMIR FLEXTOUCH) 100 UNIT/ML Pen Inject into the skin.    Marland Kitchen insulin glargine (LANTUS) 100 UNIT/ML injection Inject into the skin.    . Insulin Pen Needle (FIFTY50 PEN NEEDLES) 31G X 8 MM MISC USE AS DIRECTED    . Lancets MISC by Does not apply route.    . lidocaine-prilocaine (EMLA) cream Apply 1 application topically as needed. 30 g 0  . losartan (COZAAR) 50 MG tablet     . metFORMIN (GLUCOPHAGE) 1000 MG tablet     .  Multiple Vitamin (MULTI-VITAMINS) TABS Take by mouth.    . Omega-3 1000 MG CAPS Take by mouth.    . pioglitazone (ACTOS) 45 MG tablet TAKE 1 TABLET BY MOUTH  DAILY    . pravastatin (PRAVACHOL) 40 MG tablet     . prochlorperazine (COMPAZINE) 10 MG tablet Take 1 tablet (10 mg total) by mouth every 6 (six) hours as needed for nausea or vomiting. 30 tablet 0  . traMADol (ULTRAM) 50 MG tablet Take by mouth.     No current facility-administered medications for this visit.     Pertinent items are noted in HPI.   Review of Systems:     Cardiac Review of Systems: [Y] = yes  or   [ N ] = no   Chest Pain [    ]  Resting SOB [   ] Exertional SOB  [  ]  Orthopnea [  ]   Pedal Edema [   ]    Palpitations [  ] Syncope  [  ]   Presyncope [   ]   General Review of Systems: [Y] = yes [  ]=no Constitional: recent weight change [  ];  Wt loss over the last 3 months [   ] anorexia [  ]; fatigue [  ]; nausea [  ]; night sweats [  ]; fever [  ]; or chills [  ];           Eye : blurred vision [  ]; diplopia [   ]; vision changes [  ];  Amaurosis fugax[  ]; Resp: cough [  ];  wheezing[  ];  hemoptysis[  ]; shortness of breath[  ]; paroxysmal nocturnal dyspnea[  ]; dyspnea on exertion[  ]; or orthopnea[  ];  GI:  gallstones[  ], vomiting[  ];  dysphagia[  ]; melena[  ];  hematochezia [  ]; heartburn[  ];   Hx of  Colonoscopy[  ]; GU: kidney stones [  ]; hematuria[  ];   dysuria [  ];  nocturia[  ];  history of     obstruction [  ]; urinary frequency [  ]  Skin: rash, swelling[  ];, hair loss[  ];  peripheral edema[  ];  or itching[  ]; Musculosketetal: myalgias[  ];  joint swelling[  ];  joint erythema[  ];  joint pain[  ];  back pain[  ];  Heme/Lymph: bruising[  ];  bleeding[  ];  anemia[  ];  Neuro: TIA[  ];  headaches[  ];  stroke[  ];  vertigo[  ];  seizures[  ];   paresthesias[  ];  difficulty walking[  ];  Psych:depression[  ]; anxiety[  ];  Endocrine: diabetes[  ];  thyroid dysfunction[   ];  Immunizations: Flu up to date [  ]; Pneumococcal up to date [  ];  Other:    PHYSICAL EXAMINATION: BP (!) 145/68   Pulse 67   Temp 97.7 F (36.5 C)   Resp 18   Wt 261 lb (118.4 kg)   SpO2 98%   BMI 39.68 kg/m  General appearance: alert, cooperative and no distress Head: Normocephalic, without obvious abnormality, atraumatic Neck: no adenopathy, no carotid bruit, no JVD, supple, symmetrical, trachea midline and thyroid not enlarged, symmetric, no tenderness/mass/nodules Lymph nodes: Cervical, supraclavicular, and axillary nodes normal. Resp: clear to auscultation bilaterally Back: symmetric, no curvature. ROM normal. No CVA tenderness. Cardio: regular rate and rhythm, S1, S2 normal, no murmur, click, rub or gallop GI: soft, non-tender; bowel sounds normal; no masses,  no organomegaly Extremities: extremities normal, atraumatic, no cyanosis or edema and Homans sign is negative, no sign of DVT Neurologic: Grossly normal  Diagnostic Studies & Laboratory data:     Recent Radiology Findings:   Mr Jeri Cos Wo Contrast  Result Date: 02/16/2018 CLINICAL DATA:  Non-small cell lung cancer. Malignant neoplasm of unspecified part of unspecified bronchus or lung. EXAM: MRI HEAD WITHOUT AND WITH CONTRAST TECHNIQUE: Multiplanar, multiecho pulse sequences of the brain and surrounding structures were obtained without and with intravenous contrast. CONTRAST:  20m MULTIHANCE GADOBENATE DIMEGLUMINE 529 MG/ML IV SOLN COMPARISON:  None. FINDINGS: Brain: Atrophy and white matter changes are moderately advanced for age. No acute infarct, hemorrhage, or mass lesion is present. Postcontrast images demonstrate no pathologic enhancement. The internal auditory canals are within normal limits bilaterally. The brainstem and cerebellum are normal. Vascular: Flow is present in the major intracranial arteries. Skull and upper cervical spine: The skull base is within normal limits. The craniocervical junction is  normal. The upper cervical spine is unremarkable. Sinuses/Orbits: Mild mucosal thickening is present in the anterior paranasal sinuses. No fluid levels are present. There is some mucosal thickening in the sphenoid sinus is well. Minimal fluid is present in the inferior right mastoid air cells. No obstructing nasopharyngeal lesion is present. IMPRESSION: 1. No evidence for metastatic disease to the brain. 2. Atrophy and white matter changes are moderately advanced for age. This is nonspecific, likely reflects the sequela of chronic microvascular ischemia. 3. Mild diffuse paranasal sinus disease. 4. Minimal fluid in the right mastoid air cells. This is likely incidental. No obstructing nasopharyngeal lesion is present. Electronically Signed   By: CSan MorelleM.D.   On: 02/16/2018 08:37   Nm Pet Image Initial (pi) Skull Base To Thigh  Result Date: 02/16/2018 CLINICAL DATA:  Initial treatment strategy for lung cancer. EXAM: NUCLEAR MEDICINE PET SKULL BASE TO THIGH TECHNIQUE: 14.5 mCi F-18 FDG was injected intravenously. Full-ring PET imaging was performed from the skull base to thigh after the radiotracer. CT data was obtained and used for attenuation correction and anatomic localization. Fasting blood glucose:  121 mg/dl COMPARISON:  Chest CT 01/31/2017 FINDINGS: Mediastinal blood pool activity: SUV max 4.19 NECK: No hypermetabolic lymph nodes in the neck. Incidental CT findings: none CHEST: Right hilar lung mass measures 5.6 cm and has an SUV max of 15.45. This completely occludes the right upper lobe bronchus resulting in mild postobstructive pneumonitis. Right paratracheal lymph node measures 1.1 cm within SUV max of 3.48. No abnormal uptake within the subcarinal, contralateral mediastinum or hilum. No hypermetabolic supraclavicular or axillary lymph nodes. No pleural effusions. Incidental CT findings: Aortic atherosclerosis. Calcifications identified within the LAD and RCA and left circumflex coronary  arteries. ABDOMEN/PELVIS: No abnormal hypermetabolic activity within the liver, pancreas, adrenal glands, or spleen. No hypermetabolic lymph nodes in the abdomen or pelvis. Incidental CT findings: Aortic atherosclerosis.  No aneurysm. SKELETON: No focal hypermetabolic activity to suggest skeletal metastasis. Incidental CT findings: Lumbar spondylosis noted including bilateral L5 pars defects with anterolisthesis of L5 on S1. IMPRESSION: 1. There is intense FDG uptake associated with the right hilar lung mass which obstructs the right upper lobe bronchus. This results in mild postobstructive pneumonitis in the right upper lobe. 2. Small right paratracheal lymph node demonstrates mild FDG uptake with an SUV max of 3.48, equivocal for metastatic disease. No evidence for distant metastatic disease. 3.  Aortic Atherosclerosis (ICD10-I70.0). 4. Multi vessel coronary artery atherosclerotic calcifications. Electronically Signed   By: Kerby Moors M.D.   On: 02/16/2018 14:01   Ct Outside Films Chest  Result Date: 02/09/2018 This examination belongs to an outside facility and is stored here for comparison purposes only.  Contact the originating outside institution for any associated report or interpretation.  Dg Outside Films Chest  Result Date: 02/09/2018 This examination belongs to an outside facility and is stored here for comparison purposes only.  Contact the originating outside institution for any associated report or interpretation.    I have independently reviewed the above radiology studies  and reviewed the findings with the patient.   Recent Lab Findings: Lab Results  Component Value Date   WBC 6.0 02/16/2018   HGB 10.9 (L) 02/16/2018   HCT 33.2 (L) 02/16/2018   PLT 220 02/16/2018   GLUCOSE 149 (H) 02/16/2018   CHOL 184 06/06/2006   TRIG 197 (H) 06/06/2006   HDL 37.9 (L) 06/06/2006   LDLCALC 107 (H) 06/06/2006   ALT 9 02/16/2018   AST 15 02/16/2018   NA 138 02/16/2018   K 3.5 02/16/2018    CL 103 02/16/2018   CREATININE 1.77 (H) 02/16/2018   BUN 36 (H) 02/16/2018   CO2 25 02/16/2018   HGBA1C 7.0 (H) 06/06/2006    Chronic Kidney Disease   Stage I     GFR >90  Stage II    GFR 60-89  Stage IIIA GFR 45-59  Stage IIIB GFR 30-44  Stage IV   GFR 15-29  Stage V    GFR  <15  Lab Results  Component Value Date   CREATININE 1.77 (H) 02/16/2018   Estimated Creatinine Clearance: 49.2 mL/min (A) (by C-G formula based on SCr of 1.77 mg/dL (H)).   Assessment / Plan:   Stage IIIA  nonsmall cell cancer of the lung involving the right mainstem bronchus and takeoff of the right upper lobe bronchus and invading the mediastinum, T4 stage IIIa carcinoma of the lung Stage IIIA GFR 45-59  I reviewed the patient's photographs of his bronchoscopy CT and PET scan, with the current stage of disease would be most appropriate to treat the patient  with combined radiation and chemotherapy.  With the extent of the disease involving the mediastinum even pneumonectomy may not result in negative margins.  I have explained this to the patient and his wife.  Also discussed with them and Dr. Worthy Flank request the placement of a Port-A-Cath for chemotherapy, risks and options of use of a port were explained and the patient is agreeable with proceeding.  We will make arrangements to place the port next week  I  spent 60 minutes with  the patient face to face and greater then 50% of the time was spent in counseling and coordination of care.    Grace Isaac MD      [address] ,[place] [address] Office [phone]   Beeper [phone]  02/16/2018 4:57 PM

## 2018-02-16 NOTE — Progress Notes (Signed)
Radiation Oncology         [phone] ________________________________  Name: Darrell [name]        MRN: [id number]  Date of Service: 02/16/2018 DOB: Dec 01, 1945  XB:[id number], Darrell Musty, MD  Curt Bears, MD     REFERRING PHYSICIAN: Curt Bears, MD   DIAGNOSIS: The encounter diagnosis was Stage III squamous cell carcinoma of right lung ([place]).   HISTORY OF PRESENT ILLNESS: Darrell [name] is a 72 y.o. male seen at the request of Dr. [name] for a new diagnosis of lung cancer.  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  While there, CT imaging was performed revealing a large right hilar mass with small mediastinal lymph nodes.  The lesion did involve the right mainstem bronchus, and, he underwent bronchoscopy the same day.  Final pathology revealed an invasive squamous cell carcinoma consistent with a lung primary.  He returned home and yesterday had an MRI of the brain which was negative for metastatic disease.  This morning he had a PET scan which revealed a 5.6 cm right hilar lung mass with an SUV of 15.45 completely occluding the right upper lobe bronchus and resulting in mild postobstructive pneumonitis.  A right paratracheal node measured 1.1 cm with an SUV of 3.48.  Aortic atherosclerosis, with calcifications in the LAD and RCA as well as left circumflex flex coronary arteries were noted.  No additional evidence of metastatic disease was noted.  His case was presented this morning and thoracic oncology conference and it was recommended that he be offered chemo RT with possible surgical resection following.  He comes today to discuss treatment recommendations for his cancer.    PREVIOUS RADIATION THERAPY: No   PAST MEDICAL HISTORY:  Past Medical History:  Diagnosis Date  . Cellulitis of right lower extremity   . Chronic kidney disease   . Diabetes  mellitus without complication ([place])   . Dyslipidemia   . Hypertension        PAST SURGICAL HISTORY:No past surgical history on file.   FAMILY HISTORY:  Family History  Problem Relation Age of Onset  . Breast cancer Mother   . CAD Father      SOCIAL HISTORY:  reports that he quit smoking about 31 years ago. His smoking use included cigarettes. He has a 30.00 pack-year smoking history. He has never used smokeless tobacco. He reports that he drinks alcohol. He reports that he does not use drugs.  The patient is married and resides in [place].  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  He enjoys singing.  He is self-employed.  He has 4 children that are adults and 9 grandchildren.   ALLERGIES: Lisinopril   MEDICATIONS:  Current Outpatient Medications  Medication Sig Dispense Refill  . aspirin EC 325 MG tablet Take by mouth.    . ASTAXANTHIN PO Take by mouth.    . Blood Glucose Monitoring Suppl (ONE TOUCH ULTRA MINI) w/Device KIT 1 each by Other route once. USE AS DIRECTED    . carvedilol (COREG) 12.5 MG tablet Take by mouth.    Verneita Griffes Bark POWD Take by mouth.    . cloNIDine (CATAPRES) 0.1 MG tablet Take by mouth.    . escitalopram (LEXAPRO) 10 MG tablet     . fenofibrate 160 MG tablet     . Glucosamine Sulfate  1000 MG TABS Take by mouth.    . hydrochlorothiazide (HYDRODIURIL) 25 MG tablet TAKE 1 TABLET BY MOUTH  DAILY    . Insulin Detemir (LEVEMIR FLEXTOUCH) 100 UNIT/ML Pen Inject into the skin.    Marland Kitchen insulin glargine (LANTUS) 100 UNIT/ML injection Inject into the skin.    . Insulin Pen Needle (FIFTY50 PEN NEEDLES) 31G X 8 MM MISC USE AS DIRECTED    . Lancets MISC by Does not apply route.    . lidocaine-prilocaine (EMLA) cream Apply 1 application topically as needed. 30 g 0  . losartan (COZAAR) 50 MG tablet     . metFORMIN (GLUCOPHAGE) 1000 MG tablet     . Multiple Vitamin (MULTI-VITAMINS) TABS Take by mouth.    . Omega-3 1000 MG CAPS Take by mouth.      . pioglitazone (ACTOS) 45 MG tablet TAKE 1 TABLET BY MOUTH  DAILY    . pravastatin (PRAVACHOL) 40 MG tablet     . prochlorperazine (COMPAZINE) 10 MG tablet Take 1 tablet (10 mg total) by mouth every 6 (six) hours as needed for nausea or vomiting. 30 tablet 0  . traMADol (ULTRAM) 50 MG tablet Take by mouth.     No current facility-administered medications for this encounter.      REVIEW OF SYSTEMS: On review of systems, the patient reports that he is doing well overall. He denies any additional episodes of hemoptysis. denies any chest pain, shortness of breath, cough, fevers, chills, night sweats, unintended weight changes. He lost 15 pounds intentionally in the last 2 months. He denies any bowel or bladder disturbances, and denies abdominal pain, nausea or vomiting. He denies any new musculoskeletal or joint aches or pains. A complete review of systems is obtained and is otherwise negative.     PHYSICAL EXAM:  Wt Readings from Last 3 Encounters:  02/16/18 261 lb (118.4 kg)  02/07/18 261 lb 11.2 oz (118.7 kg)   Temp Readings from Last 3 Encounters:  02/16/18 97.7 F (36.5 C) (Oral)  02/07/18 97.7 F (36.5 C) (Oral)   BP Readings from Last 3 Encounters:  02/16/18 (!) 145/68  02/07/18 134/64   Pulse Readings from Last 3 Encounters:  02/16/18 67  02/07/18 (!) 57     In general this is a well appearing caucasian male in no acute distress. He is alert and oriented x4 and appropriate throughout the examination. HEENT reveals that the patient is normocephalic, atraumatic. EOMs are intact. PERRLA. Skin is intact without any evidence of gross lesions. Cardiovascular exam reveals a regular rate and rhythm, no clicks rubs or murmurs are auscultated. Chest is clear to auscultation in the left fields but decreased breath sounds are noted at the right apex. Lymphatic assessment is performed and does not reveal any adenopathy in the cervical, supraclavicular, axillary, or inguinal chains. Abdomen  has active bowel sounds in all quadrants and is intact. The abdomen is soft, non tender, non distended. Lower extremities are negative for pretibial pitting edema, deep calf tenderness, cyanosis or clubbing.   ECOG = 1  0 - Asymptomatic (Fully active, able to carry on all predisease activities without restriction)  1 - Symptomatic but completely ambulatory (Restricted in physically strenuous activity but ambulatory and able to carry out work of a light or sedentary nature. For example, light housework, office work)  2 - Symptomatic, <50% in bed during the day (Ambulatory and capable of all self care but unable to carry out any work activities. Up and about more than 50%  of waking hours)  3 - Symptomatic, >50% in bed, but not bedbound (Capable of only limited self-care, confined to bed or chair 50% or more of waking hours)  4 - Bedbound (Completely disabled. Cannot carry on any self-care. Totally confined to bed or chair)  5 - Death   Eustace Pen MM, Creech RH, Tormey DC, et al. [phone]). "Toxicity and response criteria of the [facility]". [place] Oncol. 5 (6): 649-55    LABORATORY DATA:  Lab Results  Component Value Date   WBC 6.0 02/16/2018   HGB 10.9 (L) 02/16/2018   HCT 33.2 (L) 02/16/2018   MCV 93.0 02/16/2018   PLT 220 02/16/2018   Lab Results  Component Value Date   NA 138 02/16/2018   K 3.5 02/16/2018   CL 103 02/16/2018   CO2 25 02/16/2018   Lab Results  Component Value Date   ALT 9 02/16/2018   AST 15 02/16/2018   ALKPHOS 53 02/16/2018   BILITOT 0.5 02/16/2018      RADIOGRAPHY: Mr Jeri Cos RU Contrast  Result Date: 02/16/2018 CLINICAL DATA:  Non-small cell lung cancer. Malignant neoplasm of unspecified part of unspecified bronchus or lung. EXAM: MRI HEAD WITHOUT AND WITH CONTRAST TECHNIQUE: Multiplanar, multiecho pulse sequences of the brain and surrounding structures were obtained without and with intravenous contrast. CONTRAST:  49m  MULTIHANCE GADOBENATE DIMEGLUMINE 529 MG/ML IV SOLN COMPARISON:  None. FINDINGS: Brain: Atrophy and white matter changes are moderately advanced for age. No acute infarct, hemorrhage, or mass lesion is present. Postcontrast images demonstrate no pathologic enhancement. The internal auditory canals are within normal limits bilaterally. The brainstem and cerebellum are normal. Vascular: Flow is present in the major intracranial arteries. Skull and upper cervical spine: The skull base is within normal limits. The craniocervical junction is normal. The upper cervical spine is unremarkable. Sinuses/Orbits: Mild mucosal thickening is present in the anterior paranasal sinuses. No fluid levels are present. There is some mucosal thickening in the sphenoid sinus is well. Minimal fluid is present in the inferior right mastoid air cells. No obstructing nasopharyngeal lesion is present. IMPRESSION: 1. No evidence for metastatic disease to the brain. 2. Atrophy and white matter changes are moderately advanced for age. This is nonspecific, likely reflects the sequela of chronic microvascular ischemia. 3. Mild diffuse paranasal sinus disease. 4. Minimal fluid in the right mastoid air cells. This is likely incidental. No obstructing nasopharyngeal lesion is present. Electronically Signed   By: CSan MorelleM.D.   On: 02/16/2018 08:37   Nm Pet Image Initial (pi) Skull Base To Thigh  Result Date: 02/16/2018 CLINICAL DATA:  Initial treatment strategy for lung cancer. EXAM: NUCLEAR MEDICINE PET SKULL BASE TO THIGH TECHNIQUE: 14.5 mCi F-18 FDG was injected intravenously. Full-ring PET imaging was performed from the skull base to thigh after the radiotracer. CT data was obtained and used for attenuation correction and anatomic localization. Fasting blood glucose: 121 mg/dl COMPARISON:  Chest CT 01/31/2017 FINDINGS: Mediastinal blood pool activity: SUV max 4.19 NECK: No hypermetabolic lymph nodes in the neck. Incidental CT  findings: none CHEST: Right hilar lung mass measures 5.6 cm and has an SUV max of 15.45. This completely occludes the right upper lobe bronchus resulting in mild postobstructive pneumonitis. Right paratracheal lymph node measures 1.1 cm within SUV max of 3.48. No abnormal uptake within the subcarinal, contralateral mediastinum or hilum. No hypermetabolic supraclavicular or axillary lymph nodes. No pleural effusions. Incidental CT findings: Aortic atherosclerosis. Calcifications identified within the LAD  and RCA and left circumflex coronary arteries. ABDOMEN/PELVIS: No abnormal hypermetabolic activity within the liver, pancreas, adrenal glands, or spleen. No hypermetabolic lymph nodes in the abdomen or pelvis. Incidental CT findings: Aortic atherosclerosis.  No aneurysm. SKELETON: No focal hypermetabolic activity to suggest skeletal metastasis. Incidental CT findings: Lumbar spondylosis noted including bilateral L5 pars defects with anterolisthesis of L5 on S1. IMPRESSION: 1. There is intense FDG uptake associated with the right hilar lung mass which obstructs the right upper lobe bronchus. This results in mild postobstructive pneumonitis in the right upper lobe. 2. Small right paratracheal lymph node demonstrates mild FDG uptake with an SUV max of 3.48, equivocal for metastatic disease. No evidence for distant metastatic disease. 3.  Aortic Atherosclerosis (ICD10-I70.0). 4. Multi vessel coronary artery atherosclerotic calcifications. Electronically Signed   By: Kerby Moors M.D.   On: 02/16/2018 14:01   Ct Outside Films Chest  Result Date: 02/09/2018 This examination belongs to an outside facility and is stored here for comparison purposes only.  Contact the originating outside institution for any associated report or interpretation.  Dg Outside Films Chest  Result Date: 02/09/2018 This examination belongs to an outside facility and is stored here for comparison purposes only.  Contact the originating  outside institution for any associated report or interpretation.      IMPRESSION/PLAN: 1. Stage IIB/IIIA, cT2bN0-2M0 NSCLC, Squamous Cell Carcinoma of the right hilum. Dr. Lisbeth Renshaw discusses the pathology findings and reviews the nature of non small cell lung cancer. He discusses the utility of chemoRT and he will also meet with Dr. Servando Snare to discuss options of surgery following this treatment. We discussed the risks, benefits, short, and long term effects of radiotherapy, and the patient is interested in proceeding. Dr. Lisbeth Renshaw discusses the delivery and logistics of radiotherapy and anticipates a course of 6 1/2 weeks of radiotherapy. Our staff will coordinate simulation and nurse eval and we anticipate starting treatment on 02/27/18. Written consent is obtained and placed in the chart, a copy was provided to the patient.    The above documentation reflects my direct findings during this shared patient visit. Please see the separate note by Dr. Lisbeth Renshaw on this date for the remainder of the patient's plan of care.    Carola Rhine, PAC

## 2018-02-16 NOTE — Progress Notes (Signed)
START ON PATHWAY REGIMEN - Non-Small Cell Lung     Administer weekly:     Paclitaxel      Carboplatin   **Always confirm dose/schedule in your pharmacy ordering system**  Patient Characteristics: Stage III - Unresectable, PS = 0, 1 AJCC T Category: T2b Current Disease Status: No Distant Mets or Local Recurrence AJCC N Category: N2 AJCC M Category: M0 AJCC 8 Stage Grouping: IIIA Performance Status: PS = 0, 1 Intent of Therapy: Curative Intent, Discussed with Patient

## 2018-02-16 NOTE — Progress Notes (Signed)
[facility] Telephone:(336) [phone]   Fax:(336) [phone]  OFFICE PROGRESS NOTE  Robyne Peers, MD [address] [place] [address]  DIAGNOSIS: Stage IIb/IIIa (T2b, N0/N2, M0), non-small cell lung cancer, squamous cell carcinoma diagnosed in July 2019 and presented with large right hilar mass with questionable mediastinal invasion  PRIOR THERAPY: None  CURRENT THERAPY: Concurrent chemoradiation with weekly carboplatin for AUC of 2 and paclitaxel 45 mg/M2.  First dose 02/27/2018.  INTERVAL HISTORY: Darrell [name] 72 y.o. male returns to the clinic today for follow-up visit accompanied by his wife and mother.  The patient is feeling fine today with no concerning complaints.  He denied having any hemoptysis since his last visit.  He denied having any chest pain but has mild cough with shortness of breath with exertion.  He has no significant weight loss or night sweats.  He has no nausea, vomiting, diarrhea or constipation.  The patient has no headache or visual changes.  He has no fever or chills.  He had several studies performed recently including MRI of the brain that was reported to be negative.  He also had a PET scan performed earlier today and the final report is a still pending.  The patient has also pulmonary function test recently.  MEDICAL HISTORY: Past Medical History:  Diagnosis Date  . Cellulitis of right lower extremity   . Chronic kidney disease   . Diabetes mellitus without complication ([place])   . Dyslipidemia   . Hypertension     ALLERGIES:  is allergic to lisinopril.  MEDICATIONS:  Current Outpatient Medications  Medication Sig Dispense Refill  . aspirin EC 325 MG tablet Take by mouth.    . ASTAXANTHIN PO Take by mouth.    . Blood Glucose Monitoring Suppl (ONE TOUCH ULTRA MINI) w/Device KIT 1 each by Other route once. USE AS DIRECTED    . carvedilol (COREG) 12.5 MG tablet Take by mouth.    Verneita Griffes Bark POWD Take by mouth.      . cloNIDine (CATAPRES) 0.1 MG tablet Take by mouth.    . escitalopram (LEXAPRO) 10 MG tablet     . fenofibrate 160 MG tablet     . Glucosamine Sulfate 1000 MG TABS Take by mouth.    . hydrochlorothiazide (HYDRODIURIL) 25 MG tablet TAKE 1 TABLET BY MOUTH  DAILY    . Insulin Detemir (LEVEMIR FLEXTOUCH) 100 UNIT/ML Pen Inject into the skin.    Marland Kitchen insulin glargine (LANTUS) 100 UNIT/ML injection Inject into the skin.    . Insulin Pen Needle (FIFTY50 PEN NEEDLES) 31G X 8 MM MISC USE AS DIRECTED    . Lancets MISC by Does not apply route.    Marland Kitchen losartan (COZAAR) 50 MG tablet     . metFORMIN (GLUCOPHAGE) 1000 MG tablet     . Multiple Vitamin (MULTI-VITAMINS) TABS Take by mouth.    . Omega-3 1000 MG CAPS Take by mouth.    . pioglitazone (ACTOS) 45 MG tablet TAKE 1 TABLET BY MOUTH  DAILY    . pravastatin (PRAVACHOL) 40 MG tablet     . traMADol (ULTRAM) 50 MG tablet Take by mouth.     No current facility-administered medications for this visit.     SURGICAL HISTORY: History reviewed. No pertinent surgical history.  REVIEW OF SYSTEMS:  Constitutional: negative Eyes: negative Ears, nose, mouth, throat, and face: negative Respiratory: positive for cough and dyspnea on exertion Cardiovascular: negative Gastrointestinal: negative Genitourinary:negative  Integument/breast: negative Hematologic/lymphatic: negative Musculoskeletal:negative Neurological: negative Behavioral/Psych: negative Endocrine: negative Allergic/Immunologic: negative   PHYSICAL EXAMINATION: General appearance: alert, cooperative and no distress Head: Normocephalic, without obvious abnormality, atraumatic Neck: no adenopathy, no JVD, supple, symmetrical, trachea midline and thyroid not enlarged, symmetric, no tenderness/mass/nodules Lymph nodes: Cervical, supraclavicular, and axillary nodes normal. Resp: clear to auscultation bilaterally Back: symmetric, no curvature. ROM normal. No CVA tenderness. Cardio: regular rate and  rhythm, S1, S2 normal, no murmur, click, rub or gallop GI: soft, non-tender; bowel sounds normal; no masses,  no organomegaly Extremities: extremities normal, atraumatic, no cyanosis or edema Neurologic: Alert and oriented X 3, normal strength and tone. Normal symmetric reflexes. Normal coordination and gait  ECOG PERFORMANCE STATUS: 1 - Symptomatic but completely ambulatory  Blood pressure (!) 145/68, pulse 67, temperature 97.7 F (36.5 C), temperature source Oral, resp. rate 18, height _0  (1.727 m), weight 261 lb (118.4 kg), SpO2 98 %.  LABORATORY DATA: Lab Results  Component Value Date   WBC 6.0 02/16/2018   HGB 10.9 (L) 02/16/2018   HCT 33.2 (L) 02/16/2018   MCV 93.0 02/16/2018   PLT 220 02/16/2018      Chemistry      Component Value Date/Time   NA 137 02/07/2018 1315   K 4.2 02/07/2018 1315   CL 100 02/07/2018 1315   CO2 28 02/07/2018 1315   BUN 32 (H) 02/07/2018 1315   CREATININE 1.57 (H) 02/07/2018 1315      Component Value Date/Time   CALCIUM 9.9 02/07/2018 1315   ALKPHOS 41 02/07/2018 1315   AST 16 02/07/2018 1315   ALT 10 02/07/2018 1315   BILITOT 0.4 02/07/2018 1315       RADIOGRAPHIC STUDIES: Mr Jeri Cos IE Contrast  Result Date: 02/16/2018 CLINICAL DATA:  Non-small cell lung cancer. Malignant neoplasm of unspecified part of unspecified bronchus or lung. EXAM: MRI HEAD WITHOUT AND WITH CONTRAST TECHNIQUE: Multiplanar, multiecho pulse sequences of the brain and surrounding structures were obtained without and with intravenous contrast. CONTRAST:  56m MULTIHANCE GADOBENATE DIMEGLUMINE 529 MG/ML IV SOLN COMPARISON:  None. FINDINGS: Brain: Atrophy and white matter changes are moderately advanced for age. No acute infarct, hemorrhage, or mass lesion is present. Postcontrast images demonstrate no pathologic enhancement. The internal auditory canals are within normal limits bilaterally. The brainstem and cerebellum are normal. Vascular: Flow is present in the major  intracranial arteries. Skull and upper cervical spine: The skull base is within normal limits. The craniocervical junction is normal. The upper cervical spine is unremarkable. Sinuses/Orbits: Mild mucosal thickening is present in the anterior paranasal sinuses. No fluid levels are present. There is some mucosal thickening in the sphenoid sinus is well. Minimal fluid is present in the inferior right mastoid air cells. No obstructing nasopharyngeal lesion is present. IMPRESSION: 1. No evidence for metastatic disease to the brain. 2. Atrophy and white matter changes are moderately advanced for age. This is nonspecific, likely reflects the sequela of chronic microvascular ischemia. 3. Mild diffuse paranasal sinus disease. 4. Minimal fluid in the right mastoid air cells. This is likely incidental. No obstructing nasopharyngeal lesion is present. Electronically Signed   By: CSan MorelleM.D.   On: 02/16/2018 08:37   Ct Outside Films Chest  Result Date: 02/09/2018 This examination belongs to an outside facility and is stored here for comparison purposes only.  Contact the originating outside institution for any associated report or interpretation.  Dg Outside Films Chest  Result Date: 02/09/2018 This examination belongs to an outside  facility and is stored here for comparison purposes only.  Contact the originating outside institution for any associated report or interpretation.   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 I had a lengthy discussion with the patient and his family today about his current disease stage, prognosis and treatment options.  I personally and independently reviewed the imaging studies and discussed the result and showed the images to the patient today.  His PET scan report is still pending but I did not see any evidence of metastatic disease other than the  main right upper lobe central lesion. His case was discussed at the weekly thoracic conference earlier today and it was felt that the patient may not be a candidate for surgical resection at least at this point. I recommended for the patient on a course of concurrent chemoradiation with weekly carboplatin for AUC of 2 and paclitaxel 45 mg/M2.  This will be followed by consolidation immunotherapy if he is not a surgical candidate after the induction chemoradiation. I discussed with the patient the adverse effects of this treatment including but not limited to alopecia, myelosuppression, nausea and vomiting, peripheral neuropathy, liver or renal dysfunction. He is expected to start the first dose of this concurrent chemoradiation on February 27, 2018. The patient will see radiation oncology later today in addition to cardiothoracic surgery, physical therapy and thoracic navigator. I will ask Dr. Servando Snare to place a Port-A-Cath for IV chemo access. I will send prescription for Compazine 10 mg p.o. every 6 hours as needed for nausea in addition to Emla Cream to his pharmacy. The patient will have a chemotherapy education class before the first dose of his treatment. He will come back for follow-up visit 1 week after his concurrent chemoradiation for evaluation and management of any adverse effect of his treatment. The patient was advised to call immediately if he has any concerning symptoms in the interval. The patient voices understanding of current disease status and treatment options and is in agreement with the current care plan.  All questions were answered. The patient knows to call the clinic with any problems, questions or concerns. We can certainly see the patient much sooner if necessary.  I spent 20 minutes counseling the patient face to face. The total time spent in the appointment was 30 minutes.  Disclaimer: This note was dictated with voice recognition software. Similar sounding words can  inadvertently be transcribed and may not be corrected upon review.

## 2018-02-16 NOTE — Telephone Encounter (Signed)
Spoke with patient to confirm appointment for today with arrival time of 12

## 2018-02-17 ENCOUNTER — Telehealth: Payer: Self-pay | Admitting: *Deleted

## 2018-02-17 ENCOUNTER — Other Ambulatory Visit: Payer: Self-pay | Admitting: *Deleted

## 2018-02-17 ENCOUNTER — Encounter: Payer: Self-pay | Admitting: *Deleted

## 2018-02-17 DIAGNOSIS — R918 Other nonspecific abnormal finding of lung field: Secondary | ICD-10-CM

## 2018-02-17 DIAGNOSIS — C3490 Malignant neoplasm of unspecified part of unspecified bronchus or lung: Secondary | ICD-10-CM

## 2018-02-17 NOTE — Telephone Encounter (Signed)
Oncology Nurse Navigator Documentation  Oncology Nurse Navigator Flowsheets 02/17/2018  Navigator Location [facility]  Navigator Encounter Type Telephone/Mr. Lejeune called me back today and left me a vm message. He had a question about.  I called and left a message and then he called back.  He was confused about his anti-nausea meds.  I will follow up with Dr. [name] on clarification.    Telephone Incoming Call;Outgoing Call  Treatment Phase Pre-Tx/Tx Discussion  Barriers/Navigation Needs Education  Education Other  Interventions Education  Education Method Verbal  Acuity Level 2  Time Spent with Patient 15

## 2018-02-17 NOTE — Progress Notes (Signed)
Oncology Nurse Navigator Documentation  Oncology Nurse Navigator Flowsheets 02/17/2018  Navigator Location [facility]  Navigator Encounter Type Clinic/MDC;Telephone/I spoke with patient and his family yesterday at thoracic clinic.  I gave and explained information on lung cancer and his treatment plan.  I looked at his schedule today and changed his chemo ed to another day due to him getting his port on 8/13.  I called and spoke with him and updated him on his upcoming schedule.  He verbalized understanding and was thankful for the help.    Telephone Outgoing Call  Abnormal Finding Date 01/31/2018  Confirmed Diagnosis Date 01/31/2018  Multidisiplinary Clinic Date 02/16/2018  Treatment Initiated Date 02/22/2018  Patient Visit Type MedOnc  Treatment Phase Pre-Tx/Tx Discussion  Barriers/Navigation Needs Education;Coordination of Care  Education Understanding Cancer/ Treatment Options;Newly Diagnosed Cancer Education  Interventions Coordination of Care;Education  Coordination of Care Appts;Other  Education Method Verbal;Written  Acuity Level 2  Time Spent with Patient 35

## 2018-02-18 ENCOUNTER — Other Ambulatory Visit: Payer: Self-pay | Admitting: Internal Medicine

## 2018-02-18 MED ORDER — PROCHLORPERAZINE MALEATE 10 MG PO TABS: 10.0000 mg | ORAL_TABLET | Freq: Four times a day (QID) | ORAL | 0 refills | Status: DC | PRN

## 2018-02-20 ENCOUNTER — Other Ambulatory Visit: Payer: Self-pay | Admitting: Internal Medicine

## 2018-02-20 ENCOUNTER — Telehealth: Payer: Self-pay | Admitting: *Deleted

## 2018-02-20 ENCOUNTER — Telehealth: Payer: Self-pay | Admitting: Internal Medicine

## 2018-02-20 MED ORDER — DEXTROSE 5 % IV SOLN
3.0000 g | INTRAVENOUS | Status: AC
  Administered 2018-02-21: 3 g via INTRAVENOUS
  Filled 2018-02-20: qty 3

## 2018-02-20 NOTE — Progress Notes (Signed)
Several unsuccessful attempts have been made to reach pt;lvm with pre-op instructions on pt voice mailbox. Pt made aware to stop taking vitamins, fish oil, ASTAXANTHIN, Cinnamon Bark, Glucosamine Sulfate, and herbal medications. Do not take any NSAIDs ie: Ibuprofen, Advil, Naproxen (Aleve), Motrin, BC and Goody Powder. Pt made aware to take Half of Levemir Insulin (5 units) tonight only if Insulin is prescribed to be taken at night.  Pt made aware to not take Metformin and Actos on DOS. Pt made aware to check BG every 2 hours prior to arrival to hospital on DOS.  If Levemir insulin is usually taken in the morning, take half (5 units) if BG > 70. If  BG < 70 do not take regularly scheduled morning insulin but instead, treat BG<70  with 4 glucose tabs or glucose gel or 4 ounces of apple or cranberry juice, wait 15 minutes after intervention to recheck BG, if BG remains < 70, call Short Stay unit to speak with a nurse.

## 2018-02-20 NOTE — Telephone Encounter (Signed)
Scheduled appt per 8/8 los - left message for patient that appts have been added per los - pt to get an updated schedule at Chemo edu class.

## 2018-02-20 NOTE — Telephone Encounter (Signed)
Oncology Nurse Navigator Documentation  Oncology Nurse Navigator Flowsheets 02/20/2018  Navigator Location [facility]  Navigator Encounter Type Telephone;Other/I followed up with Dr. [name] regarding medication question patient had contacted me about on Friday.  I then called [name] regarding issue.  I clarified order for medication.  They will call his insurance with update.  I then called patient and left vm message with an update.   Telephone Outgoing Call  Treatment Phase Pre-Tx/Tx Discussion  Barriers/Navigation Needs Education;Coordination of Care  Education Other  Interventions Coordination of Care;Education  Coordination of Care Other  Education Method Verbal  Acuity Level 2  Time Spent with Patient 30

## 2018-02-21 ENCOUNTER — Encounter (HOSPITAL_COMMUNITY)
Admission: RE | Disposition: A | Discharge status: Home / Self Care | Payer: Self-pay | Source: Ambulatory Visit | Attending: Cardiothoracic Surgery

## 2018-02-21 ENCOUNTER — Other Ambulatory Visit: Payer: Medicare Other

## 2018-02-21 ENCOUNTER — Encounter (HOSPITAL_COMMUNITY): Payer: Self-pay | Admitting: Anesthesiology

## 2018-02-21 ENCOUNTER — Ambulatory Visit (HOSPITAL_COMMUNITY)
Admission: RE | Admit: 2018-02-21 | Discharge: 2018-02-21 | Disposition: A | Discharge status: Home / Self Care | Payer: Medicare Other | Source: Ambulatory Visit | Attending: Cardiothoracic Surgery | Admitting: Cardiothoracic Surgery

## 2018-02-21 ENCOUNTER — Ambulatory Visit (HOSPITAL_COMMUNITY): Payer: Medicare Other | Admitting: Anesthesiology

## 2018-02-21 ENCOUNTER — Ambulatory Visit (HOSPITAL_COMMUNITY): Payer: Medicare Other

## 2018-02-21 DIAGNOSIS — Z87891 Personal history of nicotine dependence: Secondary | ICD-10-CM | POA: Diagnosis not present

## 2018-02-21 DIAGNOSIS — I129 Hypertensive chronic kidney disease with stage 1 through stage 4 chronic kidney disease, or unspecified chronic kidney disease: Secondary | ICD-10-CM | POA: Diagnosis not present

## 2018-02-21 DIAGNOSIS — R918 Other nonspecific abnormal finding of lung field: Secondary | ICD-10-CM

## 2018-02-21 DIAGNOSIS — E1122 Type 2 diabetes mellitus with diabetic chronic kidney disease: Secondary | ICD-10-CM | POA: Insufficient documentation

## 2018-02-21 DIAGNOSIS — Z79899 Other long term (current) drug therapy: Secondary | ICD-10-CM | POA: Diagnosis not present

## 2018-02-21 DIAGNOSIS — C3401 Malignant neoplasm of right main bronchus: Secondary | ICD-10-CM | POA: Diagnosis not present

## 2018-02-21 DIAGNOSIS — N189 Chronic kidney disease, unspecified: Secondary | ICD-10-CM | POA: Insufficient documentation

## 2018-02-21 DIAGNOSIS — C3491 Malignant neoplasm of unspecified part of right bronchus or lung: Secondary | ICD-10-CM | POA: Diagnosis present

## 2018-02-21 DIAGNOSIS — C3490 Malignant neoplasm of unspecified part of unspecified bronchus or lung: Secondary | ICD-10-CM

## 2018-02-21 DIAGNOSIS — Z09 Encounter for follow-up examination after completed treatment for conditions other than malignant neoplasm: Secondary | ICD-10-CM

## 2018-02-21 DIAGNOSIS — Z794 Long term (current) use of insulin: Secondary | ICD-10-CM | POA: Diagnosis not present

## 2018-02-21 HISTORY — PX: PORTACATH PLACEMENT: SHX2246

## 2018-02-21 LAB — CBC
HCT: 33.5 % — ABNORMAL LOW (ref 39.0–52.0)
Hemoglobin: 10.6 g/dL — ABNORMAL LOW (ref 13.0–17.0)
MCH: 30 pg (ref 26.0–34.0)
MCHC: 31.6 g/dL (ref 30.0–36.0)
MCV: 94.9 fL (ref 78.0–100.0)
Platelets: 190 10*3/uL (ref 150–400)
RBC: 3.53 MIL/uL — ABNORMAL LOW (ref 4.22–5.81)
RDW: 14.7 % (ref 11.5–15.5)
WBC: 4.2 10*3/uL (ref 4.0–10.5)

## 2018-02-21 LAB — COMPREHENSIVE METABOLIC PANEL
ALT: 12 U/L (ref 0–44)
AST: 18 U/L (ref 15–41)
Albumin: 3.6 g/dL (ref 3.5–5.0)
Alkaline Phosphatase: 36 U/L — ABNORMAL LOW (ref 38–126)
Anion gap: 11 (ref 5–15)
BUN: 32 mg/dL — ABNORMAL HIGH (ref 8–23)
CO2: 27 mmol/L (ref 22–32)
Calcium: 9.3 mg/dL (ref 8.9–10.3)
Chloride: 100 mmol/L (ref 98–111)
Creatinine, Ser: 1.62 mg/dL — ABNORMAL HIGH (ref 0.61–1.24)
GFR calc Af Amer: 48 mL/min — ABNORMAL LOW (ref >60)
GFR calc non Af Amer: 42 mL/min — ABNORMAL LOW (ref >60)
Glucose, Bld: 106 mg/dL — ABNORMAL HIGH (ref 70–99)
Potassium: 3.7 mmol/L (ref 3.5–5.1)
Sodium: 138 mmol/L (ref 135–145)
Total Bilirubin: 0.9 mg/dL (ref 0.3–1.2)
Total Protein: 6.6 g/dL (ref 6.5–8.1)

## 2018-02-21 LAB — PROTIME-INR
INR: 1.12
Prothrombin Time: 14.4 s (ref 11.4–15.2)

## 2018-02-21 LAB — GLUCOSE, CAPILLARY: Glucose-Capillary: 102 mg/dL — ABNORMAL HIGH (ref 70–99)

## 2018-02-21 LAB — APTT: aPTT: 34 s (ref 24–36)

## 2018-02-21 SURGERY — INSERTION, TUNNELED CENTRAL VENOUS DEVICE, WITH PORT
Anesthesia: Monitor Anesthesia Care | Site: Chest | Laterality: Left

## 2018-02-21 MED ORDER — ONDANSETRON HCL 4 MG/2ML IJ SOLN
INTRAMUSCULAR | Status: DC | PRN
  Administered 2018-02-21: 4 mg via INTRAVENOUS

## 2018-02-21 MED ORDER — MIDAZOLAM HCL 2 MG/2ML IJ SOLN
INTRAMUSCULAR | Status: AC
  Filled 2018-02-21: qty 2

## 2018-02-21 MED ORDER — SODIUM CHLORIDE 0.9 % IV SOLN
INTRAVENOUS | Status: DC
  Administered 2018-02-21: 12:00:00 via INTRAVENOUS

## 2018-02-21 MED ORDER — SODIUM CHLORIDE 0.9 % IV SOLN
INTRAVENOUS | Status: DC | PRN
  Administered 2018-02-21: 500 mL

## 2018-02-21 MED ORDER — MIDAZOLAM HCL 2 MG/2ML IJ SOLN
INTRAMUSCULAR | Status: DC | PRN
  Administered 2018-02-21: 2 mg via INTRAVENOUS

## 2018-02-21 MED ORDER — DEXAMETHASONE SODIUM PHOSPHATE 10 MG/ML IJ SOLN
INTRAMUSCULAR | Status: DC | PRN
  Administered 2018-02-21: 5 mg via INTRAVENOUS

## 2018-02-21 MED ORDER — PROPOFOL 10 MG/ML IV BOLUS
INTRAVENOUS | Status: DC | PRN
  Administered 2018-02-21 (×2): 20 mg via INTRAVENOUS

## 2018-02-21 MED ORDER — HEPARIN SODIUM (PORCINE) 1000 UNIT/ML IJ SOLN
INTRAMUSCULAR | Status: AC
  Filled 2018-02-21: qty 1

## 2018-02-21 MED ORDER — HEPARIN SOD (PORK) LOCK FLUSH 100 UNIT/ML IV SOLN
INTRAVENOUS | Status: DC | PRN
  Administered 2018-02-21: 250 [IU] via INTRAVENOUS

## 2018-02-21 MED ORDER — FENTANYL CITRATE (PF) 250 MCG/5ML IJ SOLN
INTRAMUSCULAR | Status: DC | PRN
  Administered 2018-02-21 (×2): 25 ug via INTRAVENOUS

## 2018-02-21 MED ORDER — PROPOFOL 500 MG/50ML IV EMUL
INTRAVENOUS | Status: DC | PRN
  Administered 2018-02-21: 100 ug/kg/min via INTRAVENOUS

## 2018-02-21 MED ORDER — LIDOCAINE HCL (PF) 1 % IJ SOLN
INTRAMUSCULAR | Status: DC | PRN
  Administered 2018-02-21: 28 mL

## 2018-02-21 MED ORDER — LIDOCAINE HCL (PF) 1 % IJ SOLN
INTRAMUSCULAR | Status: AC
  Filled 2018-02-21: qty 30

## 2018-02-21 MED ORDER — SODIUM CHLORIDE 0.9 % IR SOLN
Status: DC | PRN
  Administered 2018-02-21: 1000 mL

## 2018-02-21 MED ORDER — SODIUM CHLORIDE 0.9 % IV SOLN
INTRAVENOUS | Status: AC
  Filled 2018-02-21: qty 1.2

## 2018-02-21 MED ORDER — FENTANYL CITRATE (PF) 250 MCG/5ML IJ SOLN
INTRAMUSCULAR | Status: AC
  Filled 2018-02-21: qty 5

## 2018-02-21 MED ORDER — HEPARIN SOD (PORK) LOCK FLUSH 100 UNIT/ML IV SOLN
INTRAVENOUS | Status: AC
  Filled 2018-02-21: qty 5

## 2018-02-21 MED ORDER — FENTANYL CITRATE (PF) 100 MCG/2ML IJ SOLN: 25.0000 ug | INTRAMUSCULAR | Status: DC | PRN

## 2018-02-21 SURGICAL SUPPLY — 36 items
ADH SKN CLS APL DERMABOND .7 (GAUZE/BANDAGES/DRESSINGS) ×1
BAG DECANTER FOR FLEXI CONT (MISCELLANEOUS) ×2 IMPLANT
CANISTER SUCT 3000ML PPV (MISCELLANEOUS) ×2 IMPLANT
COVER PROBE W GEL 5X96 (DRAPES) ×2 IMPLANT
COVER SURGICAL LIGHT HANDLE (MISCELLANEOUS) ×2 IMPLANT
DERMABOND ADVANCED (GAUZE/BANDAGES/DRESSINGS) ×1
DERMABOND ADVANCED .7 DNX12 (GAUZE/BANDAGES/DRESSINGS) ×1 IMPLANT
DRAPE C-ARM 42X72 X-RAY (DRAPES) ×2 IMPLANT
DRAPE CHEST BREAST 15X10 FENES (DRAPES) ×2 IMPLANT
ELECT CAUTERY BLADE 6.4 (BLADE) ×2 IMPLANT
ELECT REM PT RETURN 9FT ADLT (ELECTROSURGICAL) ×2
ELECTRODE REM PT RTRN 9FT ADLT (ELECTROSURGICAL) ×1 IMPLANT
GAUZE SPONGE 4X4 12PLY STRL (GAUZE/BANDAGES/DRESSINGS) ×2 IMPLANT
GLOVE BIO SURGEON STRL SZ 6.5 (GLOVE) ×4 IMPLANT
GOWN STRL REUS W/ TWL LRG LVL3 (GOWN DISPOSABLE) ×2 IMPLANT
GOWN STRL REUS W/TWL LRG LVL3 (GOWN DISPOSABLE) ×4
GUIDEWIRE UNCOATED ST S 7038 (WIRE) IMPLANT
INTRODUCER 13FR (INTRODUCER) IMPLANT
INTRODUCER COOK 11FR (CATHETERS) IMPLANT
KIT BASIN OR (CUSTOM PROCEDURE TRAY) ×2 IMPLANT
KIT PORT POWER 9.6FR MRI PREA (Stent) ×1 IMPLANT
KIT TURNOVER KIT B (KITS) ×2 IMPLANT
NEEDLE 22X1 1/2 (OR ONLY) (NEEDLE) ×1 IMPLANT
NS IRRIG 1000ML POUR BTL (IV SOLUTION) ×2 IMPLANT
PACK GENERAL/GYN (CUSTOM PROCEDURE TRAY) ×2 IMPLANT
PAD ARMBOARD 7.5X6 YLW CONV (MISCELLANEOUS) ×4 IMPLANT
SET SHEATH INTRODUCER 10FR (MISCELLANEOUS) IMPLANT
SUT SILK 2 0 SH (SUTURE) ×2 IMPLANT
SUT VIC AB 3-0 SH 18 (SUTURE) ×2 IMPLANT
SUT VICRYL 4-0 PS2 18IN ABS (SUTURE) ×2 IMPLANT
SYR 20CC LL (SYRINGE) ×2 IMPLANT
SYR 5ML LUER SLIP (SYRINGE) IMPLANT
SYR CONTROL 10ML LL (SYRINGE) ×1 IMPLANT
TOWEL GREEN STERILE (TOWEL DISPOSABLE) ×2 IMPLANT
TOWEL GREEN STERILE FF (TOWEL DISPOSABLE) ×2 IMPLANT
WATER STERILE IRR 1000ML POUR (IV SOLUTION) ×2 IMPLANT

## 2018-02-21 NOTE — Brief Op Note (Signed)
      [address]       [place] [address]             [phone]      02/21/2018  2:35 PM  PATIENT:  [name]  72 y.o. male  PRE-OPERATIVE DIAGNOSIS:  LUNG CANCER RIGHT HILAR MASS  POST-OPERATIVE DIAGNOSIS:  LUNG CANCER RIGHT HILAR MASS  PROCEDURE:  Procedure(s): INSERTION PORT-A-CATH (Left)  SURGEON:  Surgeon(s) and Role:    * Grace Isaac, MD - Primary  PHYSICIAN ASSISTANT:   ASSISTANTS: none   ANESTHESIA:   MAC  EBL: none   BLOOD ADMINISTERED:none  DRAINS: none   LOCAL MEDICATIONS USED:  LIDOCAINE  and Amount: 28 ml  SPECIMEN:  No Specimen  DISPOSITION OF SPECIMEN:  N/A  COUNTS:  YES   DICTATION: .Dragon Dictation  PLAN OF CARE: Discharge to home after PACU  PATIENT DISPOSITION:  PACU - hemodynamically stable.   Delay start of Pharmacological VTE agent (>24hrs) due to surgical blood loss or risk of bleeding: yes

## 2018-02-21 NOTE — Transfer of Care (Signed)
Immediate Anesthesia Transfer of Care Note  Patient: [name]  Procedure(s) Performed: INSERTION PORT-A-CATH (Left Chest)  Patient Location: PACU  Anesthesia Type:MAC  Level of Consciousness: awake, alert  and oriented  Airway & Oxygen Therapy: Patient Spontanous Breathing  Post-op Assessment: Report given to RN and Post -op Vital signs reviewed and stable  Post vital signs: Reviewed and stable  Last Vitals:  Vitals Value Taken Time  BP    Temp    Pulse    Resp    SpO2      Last Pain:  Vitals:   02/21/18 1158  TempSrc:   PainSc: 0-No pain         Complications: No apparent anesthesia complications

## 2018-02-21 NOTE — H&P (Signed)
[address]       [place] [address]             [phone]                    Quintel E Bellerose Charco Medical Record #[id number] Date of Birth: [birth year]  Referring: No ref. provider found Primary Care: Robyne Peers, MD Primary Cardiologist: No primary care provider on file.  Chief Complaint:   Carcinoma of the right lung with hemoptysis  History of Present Illness:    [name] 72 y.o. 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  Patient presented with new onset of hemoptysis while working in [place].  While there he underwent bronchoscopy, repeat bronchoscopy and coring of obstructing mass in his right mainstem bronchus by Dr. Karalee Height in [place].  He now presents to the multidisciplinary thoracic oncology clinic to discuss treatment options    The patient was seen by Dr. Lyman Speller and on January 31, 2018 he underwent bronchoscopy with biopsy of the right hilar mass. The final pathology [id number]) from [facility], [place], [place] showed nonkeratinizing squamous cell carcinoma, moderately differentiated.  The immunohistochemical stains showed the cells were positive for CK 5 and p63 but negative for P 40, TTF-1 and CK7.  Now here for port placement   Current Activity/ Functional Status:  Patient is independent with mobility/ambulation, transfers, ADL's, IADL's.   Zubrod Score: At the time of surgery this patient's most appropriate activity status/level should be described as: []     0    Normal activity, no symptoms [x]     1    Restricted in physical strenuous activity but ambulatory, able to do out light work []     2    Ambulatory and capable of self care, unable to do work activities, up and about               >50 % of waking hours                              []     3    Only limited  self care, in bed greater than 50% of waking hours []     4    Completely disabled, no self care, confined to bed or chair []     5    Moribund   Past Medical History:  Diagnosis Date  . Cellulitis of right lower extremity   . Chronic kidney disease   . Diabetes mellitus without complication ([place])   . Dyslipidemia   . Hypertension     History reviewed. No pertinent surgical history.  Family History  Problem Relation Age of Onset  . Breast cancer Mother   . CAD Father      Social History   Tobacco Use  Smoking Status Former Smoker  . Packs/day: 1.50  . Years: 20.00  . Pack years: 30.00  . Types: Cigarettes  . Last attempt to quit: 1988  . Years since quitting: 31.6  Smokeless Tobacco Never Used    Social History   Substance and Sexual Activity  Alcohol Use Yes   Comment: occasionally     Allergies  Allergen Reactions  . Lisinopril Cough         Current Facility-Administered Medications  Medication Dose Route Frequency Provider Last Rate Last Dose  . 0.9 %  sodium chloride infusion   Intravenous Continuous Suzette Battiest, MD 10 mL/hr at 02/21/18 1151    . ceFAZolin (ANCEF) 3 g in dextrose 5 % 50 mL IVPB  3 g Intravenous To SS-Surg Grace Isaac, MD        Pertinent items are noted in HPI.   Review of Systems:     Cardiac Review of Systems: [Y] = yes  or   [ N ] = no   Chest Pain [  n  ]  Resting SOB [ n  ] Exertional SOB  Blue.Reese  ]  Orthopnea Florencio.Farrier  ]   Pedal Edema Florencio.Farrier   ]    Palpitations Florencio.Farrier  ] Syncope  [ n ]   Presyncope [ n  ]   General Review of Systems: [Y] = yes [  ]=no Constitional: recent weight change [  ];  Wt loss over the last 3 months [   ] anorexia [  ]; fatigue [  ]; nausea [  ]; night sweats [  ]; fever [  ]; or chills [  ];           Eye : blurred vision [  ]; diplopia [   ]; vision changes [  ];  Amaurosis fugax[  ]; Resp: cough [ y ];  wheezing[  ];  hemoptysis[  y]; shortness of breath[y ]; paroxysmal nocturnal dyspnea[  ]; dyspnea on  exertion[  ]; or orthopnea[  ];  GI:  gallstones[  ], vomiting[  ];  dysphagia[  ]; melena[  ];  hematochezia [  ]; heartburn[  ];   Hx of  Colonoscopy[  ]; GU: kidney stones [  ]; hematuria[  ];   dysuria [  ];  nocturia[  ];  history of     obstruction [  ]; urinary frequency [  ]             Skin: rash, swelling[  ];, hair loss[  ];  peripheral edema[  ];  or itching[  ]; Musculosketetal: myalgias[  ];  joint swelling[  ];  joint erythema[  ];  joint pain[  ];  back pain[  ];  Heme/Lymph: bruising[  ];  bleeding[  ];  anemia[  ];  Neuro: TIA[  ];  headaches[  ];  stroke[  ];  vertigo[  ];  seizures[  ];   paresthesias[  ];  difficulty walking[  ];  Psych:depression[  ]; anxiety[  ];  Endocrine: diabetes[  ];  thyroid dysfunction[  ];  Immunizations: Flu up to date [  ]; Pneumococcal up to date [  ];  Other:    PHYSICAL EXAMINATION: BP (!) 142/69   Pulse 62   Temp 98.4 F (36.9 C) (Oral)   Resp 18   Wt 117 kg   SpO2 98%   BMI 39.23 kg/m  General appearance: alert, cooperative and no distress Head: Normocephalic, without obvious abnormality, atraumatic Neck: no adenopathy, no carotid bruit, no JVD, supple, symmetrical, trachea midline and thyroid not enlarged, symmetric, no tenderness/mass/nodules Lymph nodes: Cervical, supraclavicular, and axillary nodes normal. Resp: clear to auscultation bilaterally Back: symmetric, no curvature. ROM normal. No CVA tenderness. Cardio: regular rate and rhythm, S1, S2 normal, no murmur, click, rub or gallop GI: soft, non-tender; bowel sounds normal; no masses,  no organomegaly Extremities: extremities normal, atraumatic, no cyanosis or edema and Homans sign is negative, no  sign of DVT Neurologic: Grossly normal  Diagnostic Studies & Laboratory data:     Recent Radiology Findings:   Mr Jeri Cos Wo Contrast  Result Date: 02/16/2018 CLINICAL DATA:  Non-small cell lung cancer. Malignant neoplasm of unspecified part of unspecified bronchus or  lung. EXAM: MRI HEAD WITHOUT AND WITH CONTRAST TECHNIQUE: Multiplanar, multiecho pulse sequences of the brain and surrounding structures were obtained without and with intravenous contrast. CONTRAST:  43mL MULTIHANCE GADOBENATE DIMEGLUMINE 529 MG/ML IV SOLN COMPARISON:  None. FINDINGS: Brain: Atrophy and white matter changes are moderately advanced for age. No acute infarct, hemorrhage, or mass lesion is present. Postcontrast images demonstrate no pathologic enhancement. The internal auditory canals are within normal limits bilaterally. The brainstem and cerebellum are normal. Vascular: Flow is present in the major intracranial arteries. Skull and upper cervical spine: The skull base is within normal limits. The craniocervical junction is normal. The upper cervical spine is unremarkable. Sinuses/Orbits: Mild mucosal thickening is present in the anterior paranasal sinuses. No fluid levels are present. There is some mucosal thickening in the sphenoid sinus is well. Minimal fluid is present in the inferior right mastoid air cells. No obstructing nasopharyngeal lesion is present. IMPRESSION: 1. No evidence for metastatic disease to the brain. 2. Atrophy and white matter changes are moderately advanced for age. This is nonspecific, likely reflects the sequela of chronic microvascular ischemia. 3. Mild diffuse paranasal sinus disease. 4. Minimal fluid in the right mastoid air cells. This is likely incidental. No obstructing nasopharyngeal lesion is present. Electronically Signed   By: San Morelle M.D.   On: 02/16/2018 08:37   Nm Pet Image Initial (pi) Skull Base To Thigh  Result Date: 02/16/2018 CLINICAL DATA:  Initial treatment strategy for lung cancer. EXAM: NUCLEAR MEDICINE PET SKULL BASE TO THIGH TECHNIQUE: 14.5 mCi F-18 FDG was injected intravenously. Full-ring PET imaging was performed from the skull base to thigh after the radiotracer. CT data was obtained and used for attenuation correction and  anatomic localization. Fasting blood glucose: 121 mg/dl COMPARISON:  Chest CT 01/31/2017 FINDINGS: Mediastinal blood pool activity: SUV max 4.19 NECK: No hypermetabolic lymph nodes in the neck. Incidental CT findings: none CHEST: Right hilar lung mass measures 5.6 cm and has an SUV max of 15.45. This completely occludes the right upper lobe bronchus resulting in mild postobstructive pneumonitis. Right paratracheal lymph node measures 1.1 cm within SUV max of 3.48. No abnormal uptake within the subcarinal, contralateral mediastinum or hilum. No hypermetabolic supraclavicular or axillary lymph nodes. No pleural effusions. Incidental CT findings: Aortic atherosclerosis. Calcifications identified within the LAD and RCA and left circumflex coronary arteries. ABDOMEN/PELVIS: No abnormal hypermetabolic activity within the liver, pancreas, adrenal glands, or spleen. No hypermetabolic lymph nodes in the abdomen or pelvis. Incidental CT findings: Aortic atherosclerosis.  No aneurysm. SKELETON: No focal hypermetabolic activity to suggest skeletal metastasis. Incidental CT findings: Lumbar spondylosis noted including bilateral L5 pars defects with anterolisthesis of L5 on S1. IMPRESSION: 1. There is intense FDG uptake associated with the right hilar lung mass which obstructs the right upper lobe bronchus. This results in mild postobstructive pneumonitis in the right upper lobe. 2. Small right paratracheal lymph node demonstrates mild FDG uptake with an SUV max of 3.48, equivocal for metastatic disease. No evidence for distant metastatic disease. 3.  Aortic Atherosclerosis (ICD10-I70.0). 4. Multi vessel coronary artery atherosclerotic calcifications. Electronically Signed   By: Kerby Moors M.D.   On: 02/16/2018 14:01   Ct Outside Films Chest  Result Date: 02/09/2018 This  examination belongs to an outside facility and is stored here for comparison purposes only.  Contact the originating outside institution for any  associated report or interpretation.  Dg Outside Films Chest  Result Date: 02/09/2018 This examination belongs to an outside facility and is stored here for comparison purposes only.  Contact the originating outside institution for any associated report or interpretation.    I have independently reviewed the above radiology studies  and reviewed the findings with the patient.   Recent Lab Findings: Lab Results  Component Value Date   WBC 4.2 02/21/2018   HGB 10.6 (L) 02/21/2018   HCT 33.5 (L) 02/21/2018   PLT 190 02/21/2018   GLUCOSE 106 (H) 02/21/2018   CHOL 184 06/06/2006   TRIG 197 (H) 06/06/2006   HDL 37.9 (L) 06/06/2006   LDLCALC 107 (H) 06/06/2006   ALT 12 02/21/2018   AST 18 02/21/2018   NA 138 02/21/2018   K 3.7 02/21/2018   CL 100 02/21/2018   CREATININE 1.62 (H) 02/21/2018   BUN 32 (H) 02/21/2018   CO2 27 02/21/2018   INR 1.12 02/21/2018   HGBA1C 7.0 (H) 06/06/2006    Chronic Kidney Disease   Stage I     GFR >90  Stage II    GFR 60-89  Stage IIIA GFR 45-59  Stage IIIB GFR 30-44  Stage IV   GFR 15-29  Stage V    GFR  <15  Lab Results  Component Value Date   CREATININE 1.62 (H) 02/21/2018   Estimated Creatinine Clearance: 53.4 mL/min (A) (by C-G formula based on SCr of 1.62 mg/dL (H)).   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  With the extent of the disease involving the mediastinum even pneumonectomy may not result in negative margins.  I have explained this to the patient and his wife.  Also discussed with them and Dr. Worthy Flank request the placement of a Port-A-Cath  for chemotherapy, risks and options of use of a port were explained and the patient is agreeable with proceeding.  The goals risks and alternatives of the planned surgical procedure Procedure(s): INSERTION PORT-A-CATH  have been discussed with the patient in detail. The risks of the procedure including death, infection, stroke, myocardial infarction, pneumothorax , bleeding, blood transfusion have all been discussed specifically.  I have quoted [name] a 1 % of perioperative mortality and a complication rate as high as 10 %. The patient's questions have been answered.JAEVEN WANZER is willing  to proceed with the planned procedure.     Grace Isaac MD      [address] [place] [address] Office [phone]   Beeper [phone]  02/21/2018 1:17 PM

## 2018-02-21 NOTE — Anesthesia Procedure Notes (Signed)
Procedure Name: MAC Date/Time: 02/21/2018 1:49 PM Performed by: Imagene Riches, CRNA Pre-anesthesia Checklist: Patient identified, Suction available, Emergency Drugs available and Patient being monitored Patient Re-evaluated:Patient Re-evaluated prior to induction Oxygen Delivery Method: Simple face mask Preoxygenation: Pre-oxygenation with 100% oxygen

## 2018-02-21 NOTE — Discharge Instructions (Signed)
Tissue Adhesive Wound Care Some cuts, wounds, lacerations, and incisions can be repaired by using tissue adhesive, also called skin glue. It holds the skin together so healing can happen faster. It forms a strong bond on the skin in about 1 minute, and it reaches its full strength in about 2-3 minutes. The adhesive disappears naturally while the wound is healing. It is important to take proper care of your wound at home while it heals. Follow these instructions at home: Wound care  Showers are allowed after the first 24 hours. Do not soak the area where the tissue adhesive was placed. Do not take baths, swim, or use hot tubs. Do not use any soaps, petroleum jelly products, or ointments on the wound. Certain ointments can weaken the glue.  If a bandage (dressing) has been applied, keep it dry.  Follow instructions from your health care provider about how often to change the dressing. ? Wash your hands with soap and water before you change your dressing. If soap and water are not available, use hand sanitizer. ? Change your dressing as told by your health care provider. ? Leave tissue adhesive in place. It will fall off on its own after 7-10 days.  Do not scratch, rub, or pick at the adhesive.  Do not place tape over the adhesive. The adhesive could come off of the wound when you pull the tape off.  Protect the wound from further injury until it is healed.  Protect the wound from sun and tanning bed exposure while it is healing and for several weeks after healing. General instructions  Take over-the-counter and prescription medicines only as told by your health care provider.  Keep all follow-up visits as told by your health care provider. This is important. Get help right away if:  Your wound reopens and is draining.  Your wound becomes red, swollen, hot, or tender.  You develop a rash after the glue is applied.  You have increasing pain in the wound.  You have a red streak going  away from the wound.  You have pus coming from the wound.  You have increased bleeding.  You have a fever.  You have shaking chills.  You notice a bad smell coming from the wound.  Your wound or the adhesive breaks open. This information is not intended to replace advice given to you by your health care provider. Make sure you discuss any questions you have with your health care provider. Document Released: 12/22/2000 Document Revised: 05/21/2016 Document Reviewed: 05/21/2016 Elsevier Interactive Patient Education  2017 [place] Insertion, Care After This sheet gives you information about how to care for yourself after your procedure. Your health care provider may also give you more specific instructions. If you have problems or questions, contact your health care provider. What can I expect after the procedure? After your procedure, it is common to have:  Discomfort at the port insertion site.  Bruising on the skin over the port. This should improve over 3-4 days.  Follow these instructions at home: [facility] care  After your port is placed, you will get a manufacturer's information card. The card has information about your port. Keep this card with you at all times.  Take care of the port as told by your health care provider. Ask your health care provider if you or a family member can get training for taking care of the port at home. A home health care nurse may also take care of the port.  Make sure to remember what type of port you have. Incision care  Follow instructions from your health care provider about how to take care of your port insertion site. Make sure you: ? Wash your hands with soap and water before you change your bandage (dressing). If soap and water are not available, use hand sanitizer. ? Change your dressing as told by your health care provider. ? Leave stitches (sutures), skin glue, or adhesive strips in place. These skin closures may need  to stay in place for 2 weeks or longer. If adhesive strip edges start to loosen and curl up, you may trim the loose edges. Do not remove adhesive strips completely unless your health care provider tells you to do that.  Check your port insertion site every day for signs of infection. Check for: ? More redness, swelling, or pain. ? More fluid or blood. ? Warmth. ? Pus or a bad smell. General instructions  Do not take baths, swim, or use a hot tub until your health care provider approves.  Do not lift anything that is heavier than 10 lb (4.5 kg) for a week, or as told by your health care provider.  Ask your health care provider when it is okay to: ? Return to work or school. ? Resume usual physical activities or sports.  Do not drive for 24 hours if you were given a medicine to help you relax (sedative).  Take over-the-counter and prescription medicines only as told by your health care provider.  Wear a medical alert bracelet in case of an emergency. This will tell any health care providers that you have a port.  Keep all follow-up visits as told by your health care provider. This is important. Contact a health care provider if:  You cannot flush your port with saline as directed, or you cannot draw blood from the port.  You have a fever or chills.  You have more redness, swelling, or pain around your port insertion site.  You have more fluid or blood coming from your port insertion site.  Your port insertion site feels warm to the touch.  You have pus or a bad smell coming from the port insertion site. Get help right away if:  You have chest pain or shortness of breath.  You have bleeding from your port that you cannot control. Summary  Take care of the port as told by your health care provider.  Change your dressing as told by your health care provider.  Keep all follow-up visits as told by your health care provider. This information is not intended to replace advice  given to you by your health care provider. Make sure you discuss any questions you have with your health care provider. Document Released: 04/18/2013 Document Revised: 05/19/2016 Document Reviewed: 05/19/2016 Elsevier Interactive Patient Education  2017 [place] An implanted port is a type of central line that is placed under the skin. Central lines are used to provide IV access when treatment or nutrition needs to be given through a persons veins. Implanted ports are used for long-term IV access. An implanted port may be placed because:  You need IV medicine that would be irritating to the small veins in your hands or arms.  You need long-term IV medicines, such as antibiotics.  You need IV nutrition for a long period.  You need frequent blood draws for lab tests.  You need dialysis.  Implanted ports are usually placed in the chest area, but  they can also be placed in the upper arm, the abdomen, or the leg. An implanted port has two main parts:  Reservoir. The reservoir is round and will appear as a small, raised area under your skin. The reservoir is the part where a needle is inserted to give medicines or draw blood.  Catheter. The catheter is a thin, flexible tube that extends from the reservoir. The catheter is placed into a large vein. Medicine that is inserted into the reservoir goes into the catheter and then into the vein.  How will I care for my incision site? Do not get the incision site wet. Bathe or shower as directed by your health care provider. How is my port accessed? Special steps must be taken to access the port:  Before the port is accessed, a numbing cream can be placed on the skin. This helps numb the skin over the port site.  Your health care provider uses a sterile technique to access the port. ? Your health care provider must put on a mask and sterile gloves. ? The skin over your port is cleaned carefully with an antiseptic and  allowed to dry. ? The port is gently pinched between sterile gloves, and a needle is inserted into the port.  Only "non-coring" port needles should be used to access the port. Once the port is accessed, a blood return should be checked. This helps ensure that the port is in the vein and is not clogged.  If your port needs to remain accessed for a constant infusion, a clear (transparent) bandage will be placed over the needle site. The bandage and needle will need to be changed every week, or as directed by your health care provider.  Keep the bandage covering the needle clean and dry. Do not get it wet. Follow your health care providers instructions on how to take a shower or bath while the port is accessed.  If your port does not need to stay accessed, no bandage is needed over the port.  What is flushing? Flushing helps keep the port from getting clogged. Follow your health care providers instructions on how and when to flush the port. Ports are usually flushed with saline solution or a medicine called heparin. The need for flushing will depend on how the port is used.  If the port is used for intermittent medicines or blood draws, the port will need to be flushed: ? After medicines have been given. ? After blood has been drawn. ? As part of routine maintenance.  If a constant infusion is running, the port may not need to be flushed.  How long will my port stay implanted? The port can stay in for as long as your health care provider thinks it is needed. When it is time for the port to come out, surgery will be done to remove it. The procedure is similar to the one performed when the port was put in. When should I seek immediate medical care? When you have an implanted port, you should seek immediate medical care if:  You notice a bad smell coming from the incision site.  You have swelling, redness, or drainage at the incision site.  You have more swelling or pain at the port site or  the surrounding area.  You have a fever that is not controlled with medicine.  This information is not intended to replace advice given to you by your health care provider. Make sure you discuss any questions you have with your  health care provider. Document Released: 06/28/2005 Document Revised: 12/04/2015 Document Reviewed: 03/05/2013 Elsevier Interactive Patient Education  2017 [place] Anesthesia Home Care Instructions  Activity: Get plenty of rest for the remainder of the day. A responsible individual must stay with you for 24 hours following the procedure.  For the next 24 hours, DO NOT: -Drive a car -Paediatric nurse -Drink alcoholic beverages -Take any medication unless instructed by your physician -Make any legal decisions or sign important papers.  Meals: Start with liquid foods such as gelatin or soup. Progress to regular foods as tolerated. Avoid greasy, spicy, heavy foods. If nausea and/or vomiting occur, drink only clear liquids until the nausea and/or vomiting subsides. Call your physician if vomiting continues.  Special Instructions/Symptoms: Your throat may feel dry or sore from the anesthesia or the breathing tube placed in your throat during surgery. If this causes discomfort, gargle with warm salt water. The discomfort should disappear within 24 hours.  If you had a scopolamine patch placed behind your ear for the management of post- operative nausea and/or vomiting:  1. The medication in the patch is effective for 72 hours, after which it should be removed.  Wrap patch in a tissue and discard in the trash. Wash hands thoroughly with soap and water. 2. You may remove the patch earlier than 72 hours if you experience unpleasant side effects which may include dry mouth, dizziness or visual disturbances. 3. Avoid touching the patch. Wash your hands with soap and water after contact with the patch.

## 2018-02-21 NOTE — Anesthesia Preprocedure Evaluation (Addendum)
Anesthesia Evaluation  Patient identified by MRN, date of birth, ID band Patient awake    Reviewed: Allergy & Precautions, NPO status , Patient's Chart, lab work & pertinent test results  Airway Mallampati: II  TM Distance: >3 FB Neck ROM: Full    Dental  (+) Dental Advisory Given   Pulmonary former smoker,  Lung CA   breath sounds clear to auscultation       Cardiovascular hypertension, Pt. on medications and Pt. on home beta blockers  Rhythm:Regular Rate:Normal     Neuro/Psych negative neurological ROS     GI/Hepatic negative GI ROS, Neg liver ROS,   Endo/Other  diabetes, Type 2, Insulin Dependent  Renal/GU CRFRenal disease     Musculoskeletal   Abdominal   Peds  Hematology  (+) anemia ,   Anesthesia Other Findings   Reproductive/Obstetrics                             Lab Results  Component Value Date   WBC 4.2 02/21/2018   HGB 10.6 (L) 02/21/2018   HCT 33.5 (L) 02/21/2018   MCV 94.9 02/21/2018   PLT 190 02/21/2018   Lab Results  Component Value Date   CREATININE 1.77 (H) 02/16/2018   BUN 36 (H) 02/16/2018   NA 138 02/16/2018   K 3.5 02/16/2018   CL 103 02/16/2018   CO2 25 02/16/2018    Anesthesia Physical Anesthesia Plan  ASA: III  Anesthesia Plan: MAC   Post-op Pain Management:    Induction: Intravenous  PONV Risk Score and Plan: 2 and Ondansetron, Dexamethasone and Treatment may vary due to age or medical condition  Airway Management Planned: Natural Airway and Simple Face Mask  Additional Equipment:   Intra-op Plan:   Post-operative Plan:   Informed Consent: I have reviewed the patients History and Physical, chart, labs and discussed the procedure including the risks, benefits and alternatives for the proposed anesthesia with the patient or authorized representative who has indicated his/her understanding and acceptance.   Dental advisory given  Plan  Discussed with: CRNA  Anesthesia Plan Comments:        Anesthesia Quick Evaluation

## 2018-02-22 ENCOUNTER — Encounter (HOSPITAL_COMMUNITY): Payer: Self-pay | Admitting: Cardiothoracic Surgery

## 2018-02-22 ENCOUNTER — Ambulatory Visit
Admission: RE | Admit: 2018-02-22 | Discharge: 2018-02-22 | Disposition: A | Discharge status: Home / Self Care | Payer: Medicare Other | Source: Ambulatory Visit | Attending: Radiation Oncology | Admitting: Radiation Oncology

## 2018-02-22 ENCOUNTER — Inpatient Hospital Stay: Payer: Medicare Other

## 2018-02-22 DIAGNOSIS — C3411 Malignant neoplasm of upper lobe, right bronchus or lung: Secondary | ICD-10-CM

## 2018-02-22 DIAGNOSIS — Z510 Encounter for antineoplastic radiation therapy: Secondary | ICD-10-CM | POA: Diagnosis not present

## 2018-02-22 NOTE — Op Note (Signed)
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  SURGICAL PROCEDURE:  Placement of left subclavian vein Port-A-Cath with fluoroscopic and ultrasound guidance.  SURGEON:  Lanelle Bal, MD  BRIEF HISTORY:  The patient is a 72 year old male who developed hemoptysis while working in [place].  Evaluation and biopsies confirmed stage III squamous cell carcinoma of the right upper lobe bronchus intermedius involving the mediastinum.   After workup through the Multidisciplinary Thoracic Oncology Clinic, it was decided to proceed with radiation and chemotherapy.  The patient now presents for port placement to start chemotherapy next week.  Risks and options were discussed with the  patient who agreed and signed informed consent.  DESCRIPTION OF PROCEDURE:  With adequate IV's in place, the patient was taken to the operating room and under MAC anesthesia, was sedated.  The neck and upper chest was prepped with Betadine and draped in the usual sterile manner.  We elected to place  the Port-A-Cath in the left subclavian vein, as the patient was to receive radiation over the right chest.  Lidocaine 1% was infiltrated in the left infraclavicular area using the SonoSite.  The subclavian vein was identified.  Using a single stick,  needle was easily placed into the vein with good blood return.  A J-wire was placed under fluoroscopic guidance into the superior vena cava.  We then infiltrated additional lidocaine over the anterior chest wall.  An incision was made and a subcutaneous  pocket  created for the Port-A-Cath.  The 9.5 [place] purple PowerPort Port-A-Cath was then placed in the pocket, irrigated with heparinized saline, tunneled to the needle insertion site.  Under fluoroscopic guidance, a dilator with peel-away  sheath was then passed over the guidewire into the superior vena cava.  The Port-A-Cath, which had been trimmed to the appropriate length was then placed through the sheath and positioned in the superior vena cava without difficulty.  There was easy  blood return.  The port was flushed with heparinized saline and then flushed with 100 units per mL of heparin solution 2.5 mL to fill the port.  The port was secured with silk sutures to the pectoral fascia.  The incisions were then closed with  interrupted 3-0 Vicryl, 4-0 subcuticular stitch and Dermabond.    Fluoroscopy at the completion of the procedure showed the port in good position without evidence of pneumothorax.  The patient tolerated the procedure without obvious complication.  Sponge and needle count was reported as correct at completion of the  procedure.    The patient was then transferred to the recovery room for postoperative observation and to obtain a PA and lateral chest x-ray prior to discharge home.  AN/NUANCE  D:02/22/2018 T:02/22/2018 JOB:[id number]

## 2018-02-22 NOTE — Anesthesia Postprocedure Evaluation (Signed)
Anesthesia Post Note  Patient: Darrell [name]  Procedure(s) Performed: INSERTION PORT-A-CATH (Left Chest)     Patient location during evaluation: PACU Anesthesia Type: MAC Level of consciousness: awake and alert Pain management: pain level controlled Vital Signs Assessment: post-procedure vital signs reviewed and stable Respiratory status: spontaneous breathing, nonlabored ventilation, respiratory function stable and patient connected to nasal cannula oxygen Cardiovascular status: stable and blood pressure returned to baseline Postop Assessment: no apparent nausea or vomiting Anesthetic complications: no    Last Vitals:  Vitals:   02/21/18 1454 02/21/18 1531  BP: 124/60 137/76  Pulse: (!) 57   Resp: 15 14  Temp:    SpO2: 98% 98%    Last Pain:  Vitals:   02/21/18 1531  TempSrc:   PainSc: Tyler Deis

## 2018-02-23 ENCOUNTER — Encounter: Payer: Self-pay | Admitting: Internal Medicine

## 2018-02-23 NOTE — Progress Notes (Signed)
Called pt to introduce myself as his Arboriculturist.  Unfortunately there aren't any foundations offering copay assistance for his Dx and the type of ins he has.  I informed him of the [place] and gave him the income requirement.  Pt would like to apply so he will bring his proof of income on 02/27/18.  Once approved I will give him an expense sheet.

## 2018-02-24 ENCOUNTER — Encounter: Payer: Self-pay | Admitting: *Deleted

## 2018-02-24 DIAGNOSIS — C3411 Malignant neoplasm of upper lobe, right bronchus or lung: Secondary | ICD-10-CM | POA: Diagnosis not present

## 2018-02-24 NOTE — Progress Notes (Signed)
[place] Psychosocial Distress Screening Clinical Social Work  Clinical Social Work was referred by distress screening protocol.  The patient scored a 7 on the Psychosocial Distress Thermometer which indicates moderate distress. Clinical Social Worker contacted patient by phone to assess for distress and other psychosocial needs. Darrell [name] reported no concerns at this time and anticipates everything will "go well" with treatment.  CSW encouraged patient to contact CSW as needs arise.  ONCBCN DISTRESS SCREENING 02/16/2018  Screening Type Other (comment)  Distress experienced in past week (1-10) 7  Emotional problem type Adjusting to illness;Isolation/feeling alone  Spiritual/Religous concerns type Facing my mortality  Information Concerns Type Lack of info about diagnosis;Lack of info about treatment  Referral to clinical social work Yes     Gwinda Maine, LCSW

## 2018-02-26 ENCOUNTER — Ambulatory Visit: Admission: RE | Admit: 2018-02-26 | Payer: Medicare Other | Source: Ambulatory Visit | Admitting: Radiation Oncology

## 2018-02-27 ENCOUNTER — Inpatient Hospital Stay: Payer: Medicare Other

## 2018-02-27 ENCOUNTER — Inpatient Hospital Stay (HOSPITAL_BASED_OUTPATIENT_CLINIC_OR_DEPARTMENT_OTHER): Payer: Medicare Other | Admitting: Medical

## 2018-02-27 ENCOUNTER — Telehealth: Payer: Self-pay | Admitting: Medical

## 2018-02-27 ENCOUNTER — Ambulatory Visit
Admission: RE | Admit: 2018-02-27 | Discharge: 2018-02-27 | Disposition: A | Discharge status: Home / Self Care | Payer: Medicare Other | Source: Ambulatory Visit | Attending: Radiation Oncology | Admitting: Radiation Oncology

## 2018-02-27 VITALS — BP 173/73 | HR 52 | Temp 97.9°F | Resp 18

## 2018-02-27 DIAGNOSIS — C3401 Malignant neoplasm of right main bronchus: Secondary | ICD-10-CM | POA: Diagnosis not present

## 2018-02-27 DIAGNOSIS — C3411 Malignant neoplasm of upper lobe, right bronchus or lung: Secondary | ICD-10-CM

## 2018-02-27 DIAGNOSIS — C3491 Malignant neoplasm of unspecified part of right bronchus or lung: Secondary | ICD-10-CM

## 2018-02-27 DIAGNOSIS — Z5111 Encounter for antineoplastic chemotherapy: Secondary | ICD-10-CM | POA: Diagnosis not present

## 2018-02-27 DIAGNOSIS — I10 Essential (primary) hypertension: Secondary | ICD-10-CM

## 2018-02-27 DIAGNOSIS — Z79899 Other long term (current) drug therapy: Secondary | ICD-10-CM

## 2018-02-27 DIAGNOSIS — I129 Hypertensive chronic kidney disease with stage 1 through stage 4 chronic kidney disease, or unspecified chronic kidney disease: Secondary | ICD-10-CM | POA: Diagnosis not present

## 2018-02-27 DIAGNOSIS — Z803 Family history of malignant neoplasm of breast: Secondary | ICD-10-CM

## 2018-02-27 DIAGNOSIS — Z87891 Personal history of nicotine dependence: Secondary | ICD-10-CM

## 2018-02-27 DIAGNOSIS — Z794 Long term (current) use of insulin: Secondary | ICD-10-CM

## 2018-02-27 DIAGNOSIS — N189 Chronic kidney disease, unspecified: Secondary | ICD-10-CM

## 2018-02-27 DIAGNOSIS — E1122 Type 2 diabetes mellitus with diabetic chronic kidney disease: Secondary | ICD-10-CM

## 2018-02-27 DIAGNOSIS — I700 Atherosclerosis of aorta: Secondary | ICD-10-CM

## 2018-02-27 DIAGNOSIS — Z7982 Long term (current) use of aspirin: Secondary | ICD-10-CM

## 2018-02-27 DIAGNOSIS — E785 Hyperlipidemia, unspecified: Secondary | ICD-10-CM

## 2018-02-27 LAB — CMP (CANCER CENTER ONLY)
ALT: 10 U/L (ref 0–44)
AST: 16 U/L (ref 15–41)
Albumin: 3.6 g/dL (ref 3.5–5.0)
Alkaline Phosphatase: 46 U/L (ref 38–126)
Anion gap: 12 (ref 5–15)
BUN: 39 mg/dL — ABNORMAL HIGH (ref 8–23)
CO2: 27 mmol/L (ref 22–32)
Calcium: 9.8 mg/dL (ref 8.9–10.3)
Chloride: 100 mmol/L (ref 98–111)
Creatinine: 1.82 mg/dL — ABNORMAL HIGH (ref 0.61–1.24)
GFR, Est AFR Am: 42 mL/min — ABNORMAL LOW (ref >60)
GFR, Estimated: 36 mL/min — ABNORMAL LOW (ref >60)
Glucose, Bld: 110 mg/dL — ABNORMAL HIGH (ref 70–99)
Potassium: 3.7 mmol/L (ref 3.5–5.1)
Sodium: 139 mmol/L (ref 135–145)
Total Bilirubin: 0.4 mg/dL (ref 0.3–1.2)
Total Protein: 7 g/dL (ref 6.5–8.1)

## 2018-02-27 LAB — CBC WITH DIFFERENTIAL (CANCER CENTER ONLY)
Basophils Absolute: 0.1 10*3/uL (ref 0.0–0.1)
Basophils Relative: 2 %
Eosinophils Absolute: 0.4 10*3/uL (ref 0.0–0.5)
Eosinophils Relative: 10 %
HCT: 34.1 % — ABNORMAL LOW (ref 38.4–49.9)
Hemoglobin: 11.1 g/dL — ABNORMAL LOW (ref 13.0–17.1)
Lymphocytes Relative: 27 %
Lymphs Abs: 1.1 10*3/uL (ref 0.9–3.3)
MCH: 30.6 pg (ref 27.2–33.4)
MCHC: 32.6 g/dL (ref 32.0–36.0)
MCV: 93.9 fL (ref 79.3–98.0)
Monocytes Absolute: 0.3 10*3/uL (ref 0.1–0.9)
Monocytes Relative: 8 %
Neutro Abs: 2.1 10*3/uL (ref 1.5–6.5)
Neutrophils Relative %: 53 %
Platelet Count: 202 10*3/uL (ref 140–400)
RBC: 3.63 MIL/uL — ABNORMAL LOW (ref 4.20–5.82)
RDW: 14.9 % — ABNORMAL HIGH (ref 11.0–14.6)
WBC Count: 3.9 10*3/uL — ABNORMAL LOW (ref 4.0–10.3)

## 2018-02-27 MED ORDER — SODIUM CHLORIDE 0.9 % IV SOLN
45.0000 mg/m2 | Freq: Once | INTRAVENOUS | Status: AC
  Administered 2018-02-27: 108 mg via INTRAVENOUS
  Filled 2018-02-27: qty 18

## 2018-02-27 MED ORDER — SODIUM CHLORIDE 0.9 % IV SOLN
Freq: Once | INTRAVENOUS | Status: AC
  Administered 2018-02-27: 09:00:00 via INTRAVENOUS
  Filled 2018-02-27: qty 250

## 2018-02-27 MED ORDER — PALONOSETRON HCL INJECTION 0.25 MG/5ML
0.2500 mg | Freq: Once | INTRAVENOUS | Status: AC
  Administered 2018-02-27: 0.25 mg via INTRAVENOUS

## 2018-02-27 MED ORDER — SODIUM CHLORIDE 0.9% FLUSH
10.0000 mL | INTRAVENOUS | Status: DC | PRN
  Administered 2018-02-27: 10 mL
  Filled 2018-02-27: qty 10

## 2018-02-27 MED ORDER — FAMOTIDINE IN NACL 20-0.9 MG/50ML-% IV SOLN
20.0000 mg | Freq: Once | INTRAVENOUS | Status: AC
  Administered 2018-02-27: 20 mg via INTRAVENOUS

## 2018-02-27 MED ORDER — SODIUM CHLORIDE 0.9 % IV SOLN
182.0000 mg | Freq: Once | INTRAVENOUS | Status: AC
  Administered 2018-02-27: 180 mg via INTRAVENOUS
  Filled 2018-02-27: qty 18

## 2018-02-27 MED ORDER — HEPARIN SOD (PORK) LOCK FLUSH 100 UNIT/ML IV SOLN
500.0000 [IU] | Freq: Once | INTRAVENOUS | Status: AC | PRN
  Administered 2018-02-27: 500 [IU]
  Filled 2018-02-27: qty 5

## 2018-02-27 MED ORDER — CLONIDINE HCL 0.1 MG PO TABS
0.1000 mg | ORAL_TABLET | Freq: Once | ORAL | Status: AC
  Administered 2018-02-27: 0.1 mg via ORAL

## 2018-02-27 MED ORDER — DIPHENHYDRAMINE HCL 50 MG/ML IJ SOLN
INTRAMUSCULAR | Status: AC
  Filled 2018-02-27: qty 1

## 2018-02-27 MED ORDER — CLONIDINE HCL 0.1 MG PO TABS
ORAL_TABLET | ORAL | Status: AC
  Filled 2018-02-27: qty 1

## 2018-02-27 MED ORDER — PALONOSETRON HCL INJECTION 0.25 MG/5ML
INTRAVENOUS | Status: AC
  Filled 2018-02-27: qty 5

## 2018-02-27 MED ORDER — SONAFINE EX EMUL
1.0000 "application " | Freq: Once | CUTANEOUS | Status: AC
  Administered 2018-02-27: 1 via TOPICAL

## 2018-02-27 MED ORDER — DIPHENHYDRAMINE HCL 50 MG/ML IJ SOLN
50.0000 mg | Freq: Once | INTRAMUSCULAR | Status: AC
  Administered 2018-02-27: 50 mg via INTRAVENOUS

## 2018-02-27 MED ORDER — SODIUM CHLORIDE 0.9 % IV SOLN
20.0000 mg | Freq: Once | INTRAVENOUS | Status: AC
  Administered 2018-02-27: 20 mg via INTRAVENOUS
  Filled 2018-02-27: qty 2

## 2018-02-27 MED ORDER — FAMOTIDINE IN NACL 20-0.9 MG/50ML-% IV SOLN
INTRAVENOUS | Status: AC
  Filled 2018-02-27: qty 50

## 2018-02-27 NOTE — Patient Instructions (Signed)
[facility] Discharge Instructions for Patients Receiving Chemotherapy  Today you received the following chemotherapy agents paclitaxel (Taxol) and carboplatin (Paraplatin).   To help prevent nausea and vomiting after your treatment, we encourage you to take your nausea medication as directed by your physician.    If you develop nausea and vomiting that is not controlled by your nausea medication, call the clinic.   BELOW ARE SYMPTOMS THAT SHOULD BE REPORTED IMMEDIATELY:  *FEVER GREATER THAN 100.5 F  *CHILLS WITH OR WITHOUT FEVER  NAUSEA AND VOMITING THAT IS NOT CONTROLLED WITH YOUR NAUSEA MEDICATION  *UNUSUAL SHORTNESS OF BREATH  *UNUSUAL BRUISING OR BLEEDING  TENDERNESS IN MOUTH AND THROAT WITH OR WITHOUT PRESENCE OF ULCERS  *URINARY PROBLEMS  *BOWEL PROBLEMS  UNUSUAL RASH Items with * indicate a potential emergency and should be followed up as soon as possible.  Feel free to call the clinic should you have any questions or concerns. The clinic phone number is [phone].  Please show the [facility] at check-in to the Emergency Department and triage nurse.  Paclitaxel injection What is this medicine? PACLITAXEL (PAK li TAX el) is a chemotherapy drug. It targets fast dividing cells, like cancer cells, and causes these cells to die. This medicine is used to treat ovarian cancer, breast cancer, and other cancers. This medicine may be used for other purposes; ask your health care provider or pharmacist if you have questions. COMMON BRAND NAME(S): Onxol, Taxol What should I tell my health care provider before I take this medicine? They need to know if you have any of these conditions: -blood disorders -irregular heartbeat -infection (especially a virus infection such as chickenpox, cold sores, or herpes) -liver disease -previous or ongoing radiation therapy -an unusual or allergic reaction to paclitaxel, alcohol, polyoxyethylated castor oil, other  chemotherapy agents, other medicines, foods, dyes, or preservatives -pregnant or trying to get pregnant -breast-feeding How should I use this medicine? This drug is given as an infusion into a vein. It is administered in a hospital or clinic by a specially trained health care professional. Talk to your pediatrician regarding the use of this medicine in children. Special care may be needed. Overdosage: If you think you have taken too much of this medicine contact a poison control center or emergency room at once. NOTE: This medicine is only for you. Do not share this medicine with others. What if I miss a dose? It is important not to miss your dose. Call your doctor or health care professional if you are unable to keep an appointment. What may interact with this medicine? Do not take this medicine with any of the following medications: -disulfiram -metronidazole This medicine may also interact with the following medications: -cyclosporine -diazepam -ketoconazole -medicines to increase blood counts like filgrastim, pegfilgrastim, sargramostim -other chemotherapy drugs like cisplatin, doxorubicin, epirubicin, etoposide, teniposide, vincristine -quinidine -testosterone -vaccines -verapamil Talk to your doctor or health care professional before taking any of these medicines: -acetaminophen -aspirin -ibuprofen -ketoprofen -naproxen This list may not describe all possible interactions. Give your health care provider a list of all the medicines, herbs, non-prescription drugs, or dietary supplements you use. Also tell them if you smoke, drink alcohol, or use illegal drugs. Some items may interact with your medicine. What should I watch for while using this medicine? Your condition will be monitored carefully while you are receiving this medicine. You will need important blood work done while you are taking this medicine. This medicine can cause serious allergic reactions.  To reduce your risk  you will need to take other medicine(s) before treatment with this medicine. If you experience allergic reactions like skin rash, itching or hives, swelling of the face, lips, or tongue, tell your doctor or health care professional right away. In some cases, you may be given additional medicines to help with side effects. Follow all directions for their use. This drug may make you feel generally unwell. This is not uncommon, as chemotherapy can affect healthy cells as well as cancer cells. Report any side effects. Continue your course of treatment even though you feel ill unless your doctor tells you to stop. Call your doctor or health care professional for advice if you get a fever, chills or sore throat, or other symptoms of a cold or flu. Do not treat yourself. This drug decreases your body's ability to fight infections. Try to avoid being around people who are sick. This medicine may increase your risk to bruise or bleed. Call your doctor or health care professional if you notice any unusual bleeding. Be careful brushing and flossing your teeth or using a toothpick because you may get an infection or bleed more easily. If you have any dental work done, tell your dentist you are receiving this medicine. Avoid taking products that contain aspirin, acetaminophen, ibuprofen, naproxen, or ketoprofen unless instructed by your doctor. These medicines may hide a fever. Do not become pregnant while taking this medicine. Women should inform their doctor if they wish to become pregnant or think they might be pregnant. There is a potential for serious side effects to an unborn child. Talk to your health care professional or pharmacist for more information. Do not breast-feed an infant while taking this medicine. Men are advised not to father a child while receiving this medicine. This product may contain alcohol. Ask your pharmacist or healthcare provider if this medicine contains alcohol. Be sure to tell all  healthcare providers you are taking this medicine. Certain medicines, like metronidazole and disulfiram, can cause an unpleasant reaction when taken with alcohol. The reaction includes flushing, headache, nausea, vomiting, sweating, and increased thirst. The reaction can last from 30 minutes to several hours. What side effects may I notice from receiving this medicine? Side effects that you should report to your doctor or health care professional as soon as possible: -allergic reactions like skin rash, itching or hives, swelling of the face, lips, or tongue -low blood counts - This drug may decrease the number of white blood cells, red blood cells and platelets. You may be at increased risk for infections and bleeding. -signs of infection - fever or chills, cough, sore throat, pain or difficulty passing urine -signs of decreased platelets or bleeding - bruising, pinpoint red spots on the skin, black, tarry stools, nosebleeds -signs of decreased red blood cells - unusually weak or tired, fainting spells, lightheadedness -breathing problems -chest pain -high or low blood pressure -mouth sores -nausea and vomiting -pain, swelling, redness or irritation at the injection site -pain, tingling, numbness in the hands or feet -slow or irregular heartbeat -swelling of the ankle, feet, hands Side effects that usually do not require medical attention (report to your doctor or health care professional if they continue or are bothersome): -bone pain -complete hair loss including hair on your head, underarms, pubic hair, eyebrows, and eyelashes -changes in the color of fingernails -diarrhea -loosening of the fingernails -loss of appetite -muscle or joint pain -red flush to skin -sweating This list may not describe all possible side  effects. Call your doctor for medical advice about side effects. You may report side effects to FDA at 1-800-FDA-1088. Where should I keep my medicine? This drug is given in  a hospital or clinic and will not be stored at home. NOTE: This sheet is a summary. It may not cover all possible information. If you have questions about this medicine, talk to your doctor, pharmacist, or health care provider.  2018 [employer]/Gold Standard (2015-04-29 19:58:00)  Carboplatin injection What is this medicine? CARBOPLATIN (KAR boe pla tin) is a chemotherapy drug. It targets fast dividing cells, like cancer cells, and causes these cells to die. This medicine is used to treat ovarian cancer and many other cancers. This medicine may be used for other purposes; ask your health care provider or pharmacist if you have questions. COMMON BRAND NAME(S): Paraplatin What should I tell my health care provider before I take this medicine? They need to know if you have any of these conditions: -blood disorders -hearing problems -kidney disease -recent or ongoing radiation therapy -an unusual or allergic reaction to carboplatin, cisplatin, other chemotherapy, other medicines, foods, dyes, or preservatives -pregnant or trying to get pregnant -breast-feeding How should I use this medicine? This drug is usually given as an infusion into a vein. It is administered in a hospital or clinic by a specially trained health care professional. Talk to your pediatrician regarding the use of this medicine in children. Special care may be needed. Overdosage: If you think you have taken too much of this medicine contact a poison control center or emergency room at once. NOTE: This medicine is only for you. Do not share this medicine with others. What if I miss a dose? It is important not to miss a dose. Call your doctor or health care professional if you are unable to keep an appointment. What may interact with this medicine? -medicines for seizures -medicines to increase blood counts like filgrastim, pegfilgrastim, sargramostim -some antibiotics like amikacin, gentamicin, neomycin, streptomycin,  tobramycin -vaccines Talk to your doctor or health care professional before taking any of these medicines: -acetaminophen -aspirin -ibuprofen -ketoprofen -naproxen This list may not describe all possible interactions. Give your health care provider a list of all the medicines, herbs, non-prescription drugs, or dietary supplements you use. Also tell them if you smoke, drink alcohol, or use illegal drugs. Some items may interact with your medicine. What should I watch for while using this medicine? Your condition will be monitored carefully while you are receiving this medicine. You will need important blood work done while you are taking this medicine. This drug may make you feel generally unwell. This is not uncommon, as chemotherapy can affect healthy cells as well as cancer cells. Report any side effects. Continue your course of treatment even though you feel ill unless your doctor tells you to stop. In some cases, you may be given additional medicines to help with side effects. Follow all directions for their use. Call your doctor or health care professional for advice if you get a fever, chills or sore throat, or other symptoms of a cold or flu. Do not treat yourself. This drug decreases your body's ability to fight infections. Try to avoid being around people who are sick. This medicine may increase your risk to bruise or bleed. Call your doctor or health care professional if you notice any unusual bleeding. Be careful brushing and flossing your teeth or using a toothpick because you may get an infection or bleed more easily. If you have  any dental work done, tell your dentist you are receiving this medicine. Avoid taking products that contain aspirin, acetaminophen, ibuprofen, naproxen, or ketoprofen unless instructed by your doctor. These medicines may hide a fever. Do not become pregnant while taking this medicine. Women should inform their doctor if they wish to become pregnant or think  they might be pregnant. There is a potential for serious side effects to an unborn child. Talk to your health care professional or pharmacist for more information. Do not breast-feed an infant while taking this medicine. What side effects may I notice from receiving this medicine? Side effects that you should report to your doctor or health care professional as soon as possible: -allergic reactions like skin rash, itching or hives, swelling of the face, lips, or tongue -signs of infection - fever or chills, cough, sore throat, pain or difficulty passing urine -signs of decreased platelets or bleeding - bruising, pinpoint red spots on the skin, black, tarry stools, nosebleeds -signs of decreased red blood cells - unusually weak or tired, fainting spells, lightheadedness -breathing problems -changes in hearing -changes in vision -chest pain -high blood pressure -low blood counts - This drug may decrease the number of white blood cells, red blood cells and platelets. You may be at increased risk for infections and bleeding. -nausea and vomiting -pain, swelling, redness or irritation at the injection site -pain, tingling, numbness in the hands or feet -problems with balance, talking, walking -trouble passing urine or change in the amount of urine Side effects that usually do not require medical attention (report to your doctor or health care professional if they continue or are bothersome): -hair loss -loss of appetite -metallic taste in the mouth or changes in taste This list may not describe all possible side effects. Call your doctor for medical advice about side effects. You may report side effects to FDA at 1-800-FDA-1088. Where should I keep my medicine? This drug is given in a hospital or clinic and will not be stored at home. NOTE: This sheet is a summary. It may not cover all possible information. If you have questions about this medicine, talk to your doctor, pharmacist, or health care  provider.  2018 Elsevier/Gold Standard (2007-10-03 14:38:05)

## 2018-02-27 NOTE — Telephone Encounter (Signed)
Patient scheduled per 8/19 sch message.

## 2018-02-27 NOTE — Progress Notes (Signed)
Per Dr [name] it is okay to treat pt today with Carboplatin and taxol and todays  Creatinine.

## 2018-02-27 NOTE — Progress Notes (Signed)
Pt here for patient teaching.  Pt given Radiation and You booklet and Sonafine.  Reviewed areas of pertinence such as fatigue, hair loss, skin changes and throat changes . Pt able to give teach back of to pat skin and use unscented/gentle soap,apply Sonafine bid and avoid applying anything to skin within 4 hours of treatment. Pt demonstrated understanding and verbalizes understanding of information given and will contact nursing with any questions or concerns.     Gloriajean Dell. Leonie Green, BSN

## 2018-02-27 NOTE — Progress Notes (Signed)
Dr. [name] ok to tx with ctn 1.82.

## 2018-02-27 NOTE — Progress Notes (Signed)
Pt BP elevated to 162/76 from 137/73 after 5 minutes of taxol at initial rate (44mL/hr). Pt denied any changes in symptoms. Sandi Mealy, PA notified of BP elevation to 172/76 at 1116. Taxol paused 1118 and Tanner, PA chairside at 1120. Ok to continue taxol at 1132. Order to administer 0.1mg  clonidine if SBP > 180. BP recheck at 1137 was 183/80. Clonidine 0.1mg  given orally at 1141. BP rechecked 1142 with BP of 196/83. Taxol paused. Per Sandi Mealy, PA, additional clonidine 0.1mg  given at 1200. BP recheck 1215 of 189/79 and taxol restarted. Pt finished taxol 1327. Pt remained asymptomatic through duration of treatment.

## 2018-02-28 ENCOUNTER — Ambulatory Visit
Admission: RE | Admit: 2018-02-28 | Discharge: 2018-02-28 | Disposition: A | Discharge status: Home / Self Care | Payer: Medicare Other | Source: Ambulatory Visit | Attending: Radiation Oncology | Admitting: Radiation Oncology

## 2018-02-28 DIAGNOSIS — C3411 Malignant neoplasm of upper lobe, right bronchus or lung: Secondary | ICD-10-CM | POA: Diagnosis not present

## 2018-03-01 ENCOUNTER — Ambulatory Visit
Admission: RE | Admit: 2018-03-01 | Discharge: 2018-03-01 | Disposition: A | Discharge status: Home / Self Care | Payer: Medicare Other | Source: Ambulatory Visit | Attending: Radiation Oncology | Admitting: Radiation Oncology

## 2018-03-01 DIAGNOSIS — C3411 Malignant neoplasm of upper lobe, right bronchus or lung: Secondary | ICD-10-CM | POA: Diagnosis not present

## 2018-03-01 NOTE — Progress Notes (Signed)
[facility] Progress Note   Darrell [name] [id number] 12-11-1945 72 y.o.  Darrell [name] is managed by Dr. Fanny Bien. Darrell [name]  Actively treated with chemotherapy/immunotherapy: yes  Current Therapy: Carboplatin and paclitaxel  Last Treated: 02/27/2018 (cycle 1, day 1)  Assessment: Plan:    HYPERTENSION, BENIGN  Stage III squamous cell cancer   Hypertension: The patient was seen in infusion when it was noted that his blood pressure was 172/76.  Nursing staff was instructed that it was okay to continue the patient's treatment of Taxol.  He instructions were given to give 0.1 mg of clonidine if his blood pressures greater than 180.  Clonidine was given.  Patient's blood pressure was rechecked and returned at 196/83.  He was ultimately given an additional 0.1 mg of clonidine.  The patient's blood pressure was stable at 189/79.  Taxol was restarted with the patient completing his infusion without any issues of concern.  He is on Coreg 6.25 mg p.o. twice daily, Catapres 0.1 mg p.o. twice daily, hydrochlorothiazide 25 mg p.o. once daily, and Cozaar 50 mg once daily.  Please see After Visit Summary for patient specific instructions.  Future Appointments  Date Time Provider [place]  03/01/2018  4:30 PM [facility] LINAC 3 [facility] None  03/02/2018 12:45 PM [facility] LINAC 3 [facility] None  03/03/2018 12:45 PM [facility] LINAC 3 [facility] None  03/06/2018  8:00 AM [facility] LAB 2 [facility] None  03/06/2018  8:30 AM Cira Rue K, NP [facility] None  03/06/2018  9:30 AM [facility] INFUSION [facility] None  03/06/2018  2:30 PM [facility] LINAC 3 [facility] None  03/07/2018  2:30 PM [facility] LINAC 3 [facility] None  03/08/2018 11:45 AM [facility] LINAC 3 [facility] None  03/09/2018 11:45 AM [facility] LINAC 3 [facility] None  03/10/2018  2:15 PM [facility] LINAC 3 [facility] None  03/14/2018  9:45 AM [facility] LINAC 3 [facility] None    03/14/2018 12:15 PM [facility] LAB 4 [facility] None  03/14/2018  1:00 PM [facility] INFUSION [facility] None  03/15/2018  9:45 AM [facility] LINAC 3 [facility] None  03/16/2018  9:45 AM [facility] LINAC 3 [facility] None  03/17/2018  9:45 AM [facility] LINAC 3 [facility] None  03/20/2018  8:00 AM [facility] LAB 1 [facility] None  03/20/2018  9:00 AM Curcio, Kristin R, NP [facility] None  03/20/2018 10:00 AM [facility] INFUSION [facility] None  03/20/2018  2:15 PM [facility] LINAC 3 [facility] None  03/21/2018  9:45 AM [facility] LINAC 3 [facility] None  03/22/2018  9:45 AM [facility] LINAC 3 [facility] None  03/23/2018  9:45 AM [facility] LINAC 3 [facility] None  03/24/2018  9:45 AM [facility] LINAC 3 [facility] None  03/27/2018  9:45 AM [facility] LINAC 3 [facility] None  03/27/2018 12:00 PM [facility] LAB 1 [facility] None  03/27/2018  1:00 PM [facility] INFUSION [facility] None  03/28/2018  9:45 AM [facility] LINAC 3 [facility] None  03/29/2018  9:30 AM [facility] LINAC 3 [facility] None  03/30/2018  9:30 AM [facility] LINAC 3 [facility] None  03/31/2018  9:30 AM [facility] LINAC 3 [facility] None  04/03/2018  9:30 AM [facility] LINAC 3 [facility] None  04/03/2018 10:30 AM [facility] LAB 1 [facility] None  04/03/2018 11:00 AM Curt Bears, MD [facility] None  04/03/2018 12:00 PM [facility] INFUSION [facility] None  04/04/2018  9:30 AM [facility] LINAC 3 [facility] None  04/05/2018  9:30 AM [facility] LINAC 3 [facility] None  04/06/2018  9:30 AM [facility] LINAC 3 [facility] None  04/07/2018  9:30 AM [facility] LINAC 3 [facility] None  04/10/2018  9:00 AM [facility] LINAC 3 [facility] None  04/10/2018  9:15 AM [facility] LAB 1 [facility] None  04/10/2018 10:15 AM [facility] INFUSION [facility] None  04/11/2018  9:30 AM [facility] LINAC 3 [facility] None  04/12/2018  9:30 AM [facility] LINAC 3 [facility] None  04/13/2018  9:30 AM [facility] LINAC 3  [facility] None    No orders of the defined types were placed in this encounter.      Subjective:   Patient ID:  Darrell [name] is a 72 y.o. (DOB March 22, 1946) male.  Chief Complaint: No chief complaint on file.   HPI Darrell [name] is a 72 year old male with a diagnosis of a  stage IIb/IIIa (T2b, N0/N2,M0),non-small cell lung cancer, squamous cell carcinoma which was diagnosed in July 2019 when he presented with a large right hilar mass with questionable mediastinal invasion.  Mr. Darrell [name] was seen in infusion today while he was receiving cycle 1, day 1 of carboplatin and paclitaxel.  He was receiving Taxol at the time that he was seen in infusion. It was noted that his blood pressure was 172/76.  Mr. Darrell [name] was completely asymptomatic.  He denied chest discomfort, chest tightness, or shortness of breath. The nursing staff was instructed that it was okay to continue the patient's treatment of Taxol.  He instructions were given to give 0.1 mg of clonidine if his blood pressures greater than 180.  Clonidine was given.  Patient's blood pressure was rechecked and returned at 196/83.  He was ultimately given an additional 0.1 mg of clonidine.  The patient's blood pressure was stable at 189/79.  Taxol was restarted with the patient completing his infusion without any issues of concern.  He is on Coreg 6.25 mg p.o. twice daily, Catapres 0.1 mg p.o. twice daily, hydrochlorothiazide 25 mg p.o. once daily, and Cozaar 50 mg once daily.  Medications: I have reviewed the patient's current medications.  Allergies:  Allergies  Allergen Reactions  . Lisinopril Cough         Past Medical History:  Diagnosis Date  . Cellulitis of right lower extremity   . Chronic kidney disease   . Diabetes mellitus without complication ([place])   . Dyslipidemia   . Hypertension     Past Surgical History:  Procedure Laterality Date  . PORTACATH PLACEMENT Left 02/21/2018   Procedure: INSERTION PORT-A-CATH;  Surgeon:  Grace Isaac, MD;  Location: [facility] OR;  Service: Thoracic;  Laterality: Left;    Family History  Problem Relation Age of Onset  . Breast cancer Mother   . CAD Father     Social History   Socioeconomic History  . Marital status: Married    Spouse name: Not on file  . Number of children: Not on file  . Years of education: Not on file  . Highest education level: Not on file  Occupational History  . Not on file  Social Needs  . Financial resource strain: Not on file  . Food insecurity:    Worry: Not on file    Inability: Not on file  . Transportation needs:    Medical: Not on file    Non-medical: Not on file  Tobacco Use  . Smoking status: Former Smoker    Packs/day: 1.50    Years: 20.00    Pack years: 30.00    Types: Cigarettes    Last attempt to quit: 1988    Years since quitting: 31.6  . Smokeless tobacco: Never Used  Substance and Sexual Activity  . Alcohol use:  Yes    Comment: occasionally  . Drug use: Never  . Sexual activity: Not Currently  Lifestyle  . Physical activity:    Days per week: Not on file    Minutes per session: Not on file  . Stress: Not on file  Relationships  . Social connections:    Talks on phone: Not on file    Gets together: Not on file    Attends religious service: Not on file    Active member of club or organization: Not on file    Attends meetings of clubs or organizations: Not on file    Relationship status: Not on file  . Intimate partner violence:    Fear of current or ex partner: Not on file    Emotionally abused: Not on file    Physically abused: Not on file    Forced sexual activity: Not on file  Other Topics Concern  . Not on file  Social History Narrative  . Not on file    Past Medical History, Surgical history, Social history, and Family history were reviewed and updated as appropriate.   Please see review of systems for further details on the patient's review from today.   Review of Systems:  Review of  Systems  Constitutional: Negative for diaphoresis.  Respiratory: Negative for cough, chest tightness, shortness of breath and wheezing.   Cardiovascular: Negative for chest pain and palpitations.  Gastrointestinal: Negative for nausea and vomiting.  Neurological: Negative for dizziness, light-headedness and headaches.    Objective:   Physical Exam:  There were no vitals taken for this visit. ECOG: 0  Physical Exam  Constitutional: No distress.  HENT:  Head: Normocephalic and atraumatic.  Cardiovascular: Normal rate, regular rhythm and normal heart sounds. Exam reveals no gallop and no friction rub.  No murmur heard. Pulmonary/Chest: Effort normal and breath sounds normal. No respiratory distress. He has no wheezes. He has no rales.  Neurological: He is alert.  Skin: Skin is warm and dry. No rash noted. He is not diaphoretic. No erythema.    Lab Review:     Component Value Date/Time   NA 139 02/27/2018 0759   K 3.7 02/27/2018 0759   CL 100 02/27/2018 0759   CO2 27 02/27/2018 0759   GLUCOSE 110 (H) 02/27/2018 0759   GLUCOSE 102 (H) 06/06/2006 0946   BUN 39 (H) 02/27/2018 0759   CREATININE 1.82 (H) 02/27/2018 0759   CALCIUM 9.8 02/27/2018 0759   PROT 7.0 02/27/2018 0759   ALBUMIN 3.6 02/27/2018 0759   AST 16 02/27/2018 0759   ALT 10 02/27/2018 0759   ALKPHOS 46 02/27/2018 0759   BILITOT 0.4 02/27/2018 0759   GFRNONAA 36 (L) 02/27/2018 0759   GFRAA 42 (L) 02/27/2018 0759       Component Value Date/Time   WBC 3.9 (L) 02/27/2018 0759   WBC 4.2 02/21/2018 1120   RBC 3.63 (L) 02/27/2018 0759   HGB 11.1 (L) 02/27/2018 0759   HCT 34.1 (L) 02/27/2018 0759   PLT 202 02/27/2018 0759   MCV 93.9 02/27/2018 0759   MCH 30.6 02/27/2018 0759   MCHC 32.6 02/27/2018 0759   RDW 14.9 (H) 02/27/2018 0759   LYMPHSABS 1.1 02/27/2018 0759   MONOABS 0.3 02/27/2018 0759   EOSABS 0.4 02/27/2018 0759   BASOSABS 0.1 02/27/2018 0759   -------------------------------  Imaging from  last 24 hours (if applicable):  Radiology interpretation: Dg Chest 2 View  Result Date: 02/21/2018 CLINICAL DATA:  Port-A-Cath placement EXAM: CHEST -  2 VIEW COMPARISON:  February 21, 2018 study obtained earlier in the day. PET-CT February 16, 2018 FINDINGS: Port-A-Cath tip is in the superior vena cava. No pneumothorax. There is soft tissue prominence in the right hilar region consistent with adenopathy. There is right perihilar atelectatic change. There is no new opacity. Heart size and pulmonary vascularity are within normal limits. There is degenerative change in the thoracic spine. IMPRESSION: Port-A-Cath tip in superior vena cava. No pneumothorax. Right hilar adenopathy with right perihilar atelectasis, stable. No new opacity. Stable cardiac silhouette. Electronically Signed   By: Lowella Grip III M.D.   On: 02/21/2018 15:17   Dg Chest 2 View  Result Date: 02/21/2018 CLINICAL DATA:  Port-A-Cath placement. EXAM: CHEST - 2 VIEW COMPARISON:  PET-CT 02/16/2018.  Chest x-ray 01/31/2018. FINDINGS: Unchanged mediastinal right hilar fullness noted. Reference is made to PET-CT report of 02/16/2018. Heart size stable. Lungs are clear of acute infiltrates. No pleural effusion or pneumothorax. IMPRESSION: Unchanged mediastinal/right paratracheal fullness as previously described on PET-CT of 02/16/2018. No acute pulmonary disease identified. Electronically Signed   By: Marcello Moores  Register   On: 02/21/2018 12:49   Mr Brain W Wo Contrast  Result Date: 02/16/2018 CLINICAL DATA:  Non-small cell lung cancer. Malignant neoplasm of unspecified part of unspecified bronchus or lung. EXAM: MRI HEAD WITHOUT AND WITH CONTRAST TECHNIQUE: Multiplanar, multiecho pulse sequences of the brain and surrounding structures were obtained without and with intravenous contrast. CONTRAST:  57mL MULTIHANCE GADOBENATE DIMEGLUMINE 529 MG/ML IV SOLN COMPARISON:  None. FINDINGS: Brain: Atrophy and white matter changes are moderately advanced  for age. No acute infarct, hemorrhage, or mass lesion is present. Postcontrast images demonstrate no pathologic enhancement. The internal auditory canals are within normal limits bilaterally. The brainstem and cerebellum are normal. Vascular: Flow is present in the major intracranial arteries. Skull and upper cervical spine: The skull base is within normal limits. The craniocervical junction is normal. The upper cervical spine is unremarkable. Sinuses/Orbits: Mild mucosal thickening is present in the anterior paranasal sinuses. No fluid levels are present. There is some mucosal thickening in the sphenoid sinus is well. Minimal fluid is present in the inferior right mastoid air cells. No obstructing nasopharyngeal lesion is present. IMPRESSION: 1. No evidence for metastatic disease to the brain. 2. Atrophy and white matter changes are moderately advanced for age. This is nonspecific, likely reflects the sequela of chronic microvascular ischemia. 3. Mild diffuse paranasal sinus disease. 4. Minimal fluid in the right mastoid air cells. This is likely incidental. No obstructing nasopharyngeal lesion is present. Electronically Signed   By: San Morelle M.D.   On: 02/16/2018 08:37   Nm Pet Image Initial (pi) Skull Base To Thigh  Result Date: 02/16/2018 CLINICAL DATA:  Initial treatment strategy for lung cancer. EXAM: NUCLEAR MEDICINE PET SKULL BASE TO THIGH TECHNIQUE: 14.5 mCi F-18 FDG was injected intravenously. Full-ring PET imaging was performed from the skull base to thigh after the radiotracer. CT data was obtained and used for attenuation correction and anatomic localization. Fasting blood glucose: 121 mg/dl COMPARISON:  Chest CT 01/31/2017 FINDINGS: Mediastinal blood pool activity: SUV max 4.19 NECK: No hypermetabolic lymph nodes in the neck. Incidental CT findings: none CHEST: Right hilar lung mass measures 5.6 cm and has an SUV max of 15.45. This completely occludes the right upper lobe bronchus  resulting in mild postobstructive pneumonitis. Right paratracheal lymph node measures 1.1 cm within SUV max of 3.48. No abnormal uptake within the subcarinal, contralateral mediastinum or hilum. No hypermetabolic supraclavicular  or axillary lymph nodes. No pleural effusions. Incidental CT findings: Aortic atherosclerosis. Calcifications identified within the LAD and RCA and left circumflex coronary arteries. ABDOMEN/PELVIS: No abnormal hypermetabolic activity within the liver, pancreas, adrenal glands, or spleen. No hypermetabolic lymph nodes in the abdomen or pelvis. Incidental CT findings: Aortic atherosclerosis.  No aneurysm. SKELETON: No focal hypermetabolic activity to suggest skeletal metastasis. Incidental CT findings: Lumbar spondylosis noted including bilateral L5 pars defects with anterolisthesis of L5 on S1. IMPRESSION: 1. There is intense FDG uptake associated with the right hilar lung mass which obstructs the right upper lobe bronchus. This results in mild postobstructive pneumonitis in the right upper lobe. 2. Small right paratracheal lymph node demonstrates mild FDG uptake with an SUV max of 3.48, equivocal for metastatic disease. No evidence for distant metastatic disease. 3.  Aortic Atherosclerosis (ICD10-I70.0). 4. Multi vessel coronary artery atherosclerotic calcifications. Electronically Signed   By: Kerby Moors M.D.   On: 02/16/2018 14:01   Dg Fluoro Guide Cv Line-no Report  Result Date: 02/21/2018 Fluoroscopy was utilized by the requesting physician.  No radiographic interpretation.   Ct Outside Films Chest  Result Date: 02/09/2018 This examination belongs to an outside facility and is stored here for comparison purposes only.  Contact the originating outside institution for any associated report or interpretation.  Dg Outside Films Chest  Result Date: 02/09/2018 This examination belongs to an outside facility and is stored here for comparison purposes only.  Contact the  originating outside institution for any associated report or interpretation.

## 2018-03-02 ENCOUNTER — Ambulatory Visit
Admission: RE | Admit: 2018-03-02 | Discharge: 2018-03-02 | Disposition: A | Discharge status: Home / Self Care | Payer: Medicare Other | Source: Ambulatory Visit | Attending: Radiation Oncology | Admitting: Radiation Oncology

## 2018-03-02 DIAGNOSIS — C3411 Malignant neoplasm of upper lobe, right bronchus or lung: Secondary | ICD-10-CM | POA: Diagnosis not present

## 2018-03-03 ENCOUNTER — Ambulatory Visit
Admission: RE | Admit: 2018-03-03 | Discharge: 2018-03-03 | Disposition: A | Discharge status: Home / Self Care | Payer: Medicare Other | Source: Ambulatory Visit | Attending: Radiation Oncology | Admitting: Radiation Oncology

## 2018-03-03 DIAGNOSIS — C3411 Malignant neoplasm of upper lobe, right bronchus or lung: Secondary | ICD-10-CM | POA: Diagnosis not present

## 2018-03-05 NOTE — Progress Notes (Signed)
[facility]  Telephone:([phone] [phone]  Clinic Follow up Note   Patient Care Team: Robyne Peers, MD as PCP - General (Family Medicine) 03/06/2018   DIAGNOSIS: Stage IIb/IIIa (T2b, N0/N2,M0),non-small cell lung cancer, squamous cell carcinoma diagnosed in July 2019 and presented with large right hilar mass with questionable mediastinal invasion  PRIOR THERAPY: None  CURRENT THERAPY: Concurrent chemoradiation with weekly carboplatin for AUC of 2 and paclitaxel 45 mg/M2.  First dose 02/27/2018.  INTERVAL HISTORY: Mr. Pirro returns for follow up and treatment as scheduled. He began chemoRT with carboplatin and taxol on 8/19. He tolerated the infusion without difficulty. Energy level and appetite remain normal. He had a "bout" of constipation he managed with fiber. Intermittent dry cough is unchanged; denies dyspnea, chest pain, hemoptysis, or wheezing. He developed small mouth sore after chemo, not limiting po intake. He denies pain otherwise.    REVIEW OF SYSTEMS:   Constitutional: Denies fevers, chills, fatigue, or abnormal weight loss Ears, nose, mouth, throat, and face: Denies or sore throat (+) mucositis  Respiratory: Denies hemoptysis, dyspnea or wheezes (+) intermittent dry cough, at baseline  Cardiovascular: Denies palpitation, chest discomfort or lower extremity swelling Gastrointestinal:  Denies nausea, vomiting, diarrhea, dysphagia, heartburn or change in bowel habits (+) constipation  Skin: Denies abnormal skin rashes Lymphatics: Denies new lymphadenopathy or easy bruising Neurological:Denies numbness, tingling or new weaknesses All other systems were reviewed with the patient and are negative.  MEDICAL HISTORY:  Past Medical History:  Diagnosis Date  . Cellulitis of right lower extremity   . Chronic kidney disease   . Diabetes mellitus without complication ([place])   . Dyslipidemia   . Hypertension     SURGICAL HISTORY: Past  Surgical History:  Procedure Laterality Date  . PORTACATH PLACEMENT Left 02/21/2018   Procedure: INSERTION PORT-A-CATH;  Surgeon: Grace Isaac, MD;  Location: [facility] OR;  Service: Thoracic;  Laterality: Left;    I have reviewed the social history and family history with the patient and they are unchanged from previous note.  ALLERGIES:  is allergic to lisinopril.  MEDICATIONS:  Current Outpatient Medications  Medication Sig Dispense Refill  . ASTAXANTHIN PO Take 1 capsule by mouth daily.     . Blood Glucose Monitoring Suppl (ONE TOUCH ULTRA MINI) w/Device KIT 1 kit by Other route once.     . carvedilol (COREG) 6.25 MG tablet Take 6.25 mg by mouth 2 (two) times daily with a meal.     . Cinnamon Bark POWD Take 1,000 mg by mouth 2 (two) times daily.     . cloNIDine (CATAPRES) 0.1 MG tablet Take 0.1 mg by mouth 2 (two) times daily.     Marland Kitchen escitalopram (LEXAPRO) 10 MG tablet Take 10 mg by mouth daily.     . fenofibrate 160 MG tablet Take 160 mg by mouth daily.     . Glucosamine Sulfate 1000 MG TABS Take 2,000 mg by mouth daily.     . hydrochlorothiazide (HYDRODIURIL) 25 MG tablet Take 25 mg by mouth daily.     . Insulin Detemir (LEVEMIR FLEXTOUCH) 100 UNIT/ML Pen Inject 10 Units into the skin daily.     . Insulin Pen Needle (FIFTY50 PEN NEEDLES) 31G X 8 MM MISC USE AS DIRECTED    . Lancets MISC by Does not apply route.    . lidocaine-prilocaine (EMLA) cream Apply 1 application topically as needed. 30 g 0  . losartan (COZAAR) 50 MG tablet Take 50 mg by mouth daily.     Marland Kitchen  magnesium oxide (MAG-OX) 400 MG tablet Take 400 mg by mouth daily.    . metFORMIN (GLUCOPHAGE) 1000 MG tablet Take 1,000 mg by mouth 2 (two) times daily with a meal.     . Multiple Vitamin (MULTI-VITAMINS) TABS Take 1 tablet by mouth daily.     . Omega-3 1000 MG CAPS Take 1,200 mg by mouth 2 (two) times daily.     . pioglitazone (ACTOS) 45 MG tablet Take 45 mg by mouth daily.     . pravastatin (PRAVACHOL) 40 MG tablet Take  40 mg by mouth daily.     . prochlorperazine (COMPAZINE) 10 MG tablet TAKE 1 TABLET BY MOUTH EVERY 6 HOURS AS NEEDED FOR NAUSEA OR VOMITING 385 tablet 0  . traMADol (ULTRAM) 50 MG tablet Take 50 mg by mouth every 6 (six) hours as needed for severe pain.      No current facility-administered medications for this visit.     PHYSICAL EXAMINATION: ECOG PERFORMANCE STATUS: 1 - Symptomatic but completely ambulatory  Vitals:   03/06/18 0817  BP: (!) 152/69  Pulse: 71  Resp: 16  Temp: 98.3 F (36.8 C)  SpO2: 98%   Filed Weights   03/06/18 0817  Weight: 256 lb 11.2 oz (116.4 kg)    GENERAL:alert, no distress and comfortable SKIN: no rashes or significant lesions EYES: sclera clear OROPHARYNX: no thrush. Small ulceration to left lower buccal surface LYMPH:  no palpable cervical or supraclavicular lymphadenopathy  LUNGS: clear to auscultation with normal breathing effort HEART: regular rate & rhythm, no murmur, and no lower extremity edema ABDOMEN:abdomen soft, non-tender and normal bowel sounds Musculoskeletal:no cyanosis of digits and no clubbing  NEURO: alert & oriented x 3 with fluent speech, no focal motor deficits PAC without erythema, healing well    LABORATORY DATA:  I have reviewed the data as listed CBC Latest Ref Rng & Units 03/06/2018 02/27/2018 02/21/2018  WBC 4.0 - 10.3 K/uL 3.0(L) 3.9(L) 4.2  Hemoglobin 13.0 - 17.1 g/dL 10.8(L) 11.1(L) 10.6(L)  Hematocrit 38.4 - 49.9 % 33.1(L) 34.1(L) 33.5(L)  Platelets 140 - 400 K/uL 232 202 190     CMP Latest Ref Rng & Units 03/06/2018 02/27/2018 02/21/2018  Glucose 70 - 99 mg/dL 124(H) 110(H) 106(H)  BUN 8 - 23 mg/dL 38(H) 39(H) 32(H)  Creatinine 0.61 - 1.24 mg/dL 1.56(H) 1.82(H) 1.62(H)  Sodium 135 - 145 mmol/L 140 139 138  Potassium 3.5 - 5.1 mmol/L 4.1 3.7 3.7  Chloride 98 - 111 mmol/L 102 100 100  CO2 22 - 32 mmol/L '27 27 27  '$ Calcium 8.9 - 10.3 mg/dL 10.2 9.8 9.3  Total Protein 6.5 - 8.1 g/dL 7.0 7.0 6.6  Total Bilirubin  0.3 - 1.2 mg/dL 0.4 0.4 0.9  Alkaline Phos 38 - 126 U/L 39 46 36(L)  AST 15 - 41 U/L '15 16 18  '$ ALT 0 - 44 U/L '11 10 12      '$ RADIOGRAPHIC STUDIES: I have personally reviewed the radiological images as listed and agreed with the findings in the report. No results found.   ASSESSMENT & PLAN:  Non-small cell lung cancer or right lung, squamous cell carcinoma  Mr. Claud is doing well. He completed his first week of chemoRT with carboplatin AUC 2 and taxol 45 mg/m2. He developed mild mucositis, not painful and not affecting po intake. We reviewed various treatment, I recommend salt/baking soda rinse TID. If he develops worsening symptoms that limit po intake, he will call for prescription magic mouthwash. Labs reviewed, CBC  and CMP are stable. Cr slightly improved. I recommend he proceed with cycle 2 taxol and carboplatin and continue daily RT. He will return in 1 week for lab and cycle 3, will f/u in 2 weeks with cycle 4. The plan was reviewed with Dr. [name].   All questions were answered. The patient knows to call the clinic with any problems, questions or concerns. No barriers to learning was detected.    Alla Feeling, NP 03/06/18

## 2018-03-06 ENCOUNTER — Ambulatory Visit
Admission: RE | Admit: 2018-03-06 | Discharge: 2018-03-06 | Disposition: A | Discharge status: Home / Self Care | Payer: Medicare Other | Source: Ambulatory Visit | Attending: Radiation Oncology | Admitting: Radiation Oncology

## 2018-03-06 ENCOUNTER — Inpatient Hospital Stay: Payer: Medicare Other | Admitting: Nurse Practitioner

## 2018-03-06 ENCOUNTER — Inpatient Hospital Stay: Payer: Medicare Other

## 2018-03-06 ENCOUNTER — Encounter: Payer: Self-pay | Admitting: Nurse Practitioner

## 2018-03-06 VITALS — BP 175/80 | HR 60

## 2018-03-06 VITALS — BP 152/69 | HR 71 | Temp 98.3°F | Resp 16 | Ht 68.0 in | Wt 256.7 lb

## 2018-03-06 DIAGNOSIS — N189 Chronic kidney disease, unspecified: Secondary | ICD-10-CM

## 2018-03-06 DIAGNOSIS — Z5111 Encounter for antineoplastic chemotherapy: Secondary | ICD-10-CM

## 2018-03-06 DIAGNOSIS — C3491 Malignant neoplasm of unspecified part of right bronchus or lung: Secondary | ICD-10-CM

## 2018-03-06 DIAGNOSIS — I129 Hypertensive chronic kidney disease with stage 1 through stage 4 chronic kidney disease, or unspecified chronic kidney disease: Secondary | ICD-10-CM

## 2018-03-06 DIAGNOSIS — C3401 Malignant neoplasm of right main bronchus: Secondary | ICD-10-CM | POA: Diagnosis not present

## 2018-03-06 DIAGNOSIS — Z79899 Other long term (current) drug therapy: Secondary | ICD-10-CM

## 2018-03-06 DIAGNOSIS — C3411 Malignant neoplasm of upper lobe, right bronchus or lung: Secondary | ICD-10-CM

## 2018-03-06 DIAGNOSIS — Z794 Long term (current) use of insulin: Secondary | ICD-10-CM

## 2018-03-06 DIAGNOSIS — I700 Atherosclerosis of aorta: Secondary | ICD-10-CM

## 2018-03-06 DIAGNOSIS — Z803 Family history of malignant neoplasm of breast: Secondary | ICD-10-CM

## 2018-03-06 DIAGNOSIS — E785 Hyperlipidemia, unspecified: Secondary | ICD-10-CM

## 2018-03-06 DIAGNOSIS — Z7982 Long term (current) use of aspirin: Secondary | ICD-10-CM

## 2018-03-06 DIAGNOSIS — E1122 Type 2 diabetes mellitus with diabetic chronic kidney disease: Secondary | ICD-10-CM

## 2018-03-06 DIAGNOSIS — Z87891 Personal history of nicotine dependence: Secondary | ICD-10-CM

## 2018-03-06 LAB — CBC WITH DIFFERENTIAL (CANCER CENTER ONLY)
Basophils Absolute: 0.1 10*3/uL (ref 0.0–0.1)
Basophils Relative: 2 %
Eosinophils Absolute: 0.3 10*3/uL (ref 0.0–0.5)
Eosinophils Relative: 11 %
HCT: 33.1 % — ABNORMAL LOW (ref 38.4–49.9)
Hemoglobin: 10.8 g/dL — ABNORMAL LOW (ref 13.0–17.1)
Lymphocytes Relative: 21 %
Lymphs Abs: 0.6 10*3/uL — ABNORMAL LOW (ref 0.9–3.3)
MCH: 30.4 pg (ref 27.2–33.4)
MCHC: 32.6 g/dL (ref 32.0–36.0)
MCV: 93.2 fL (ref 79.3–98.0)
Monocytes Absolute: 0.2 10*3/uL (ref 0.1–0.9)
Monocytes Relative: 7 %
Neutro Abs: 1.8 10*3/uL (ref 1.5–6.5)
Neutrophils Relative %: 59 %
Platelet Count: 232 10*3/uL (ref 140–400)
RBC: 3.55 MIL/uL — ABNORMAL LOW (ref 4.20–5.82)
RDW: 14.5 % (ref 11.0–14.6)
WBC Count: 3 10*3/uL — ABNORMAL LOW (ref 4.0–10.3)

## 2018-03-06 LAB — CMP (CANCER CENTER ONLY)
ALT: 11 U/L (ref 0–44)
AST: 15 U/L (ref 15–41)
Albumin: 3.7 g/dL (ref 3.5–5.0)
Alkaline Phosphatase: 39 U/L (ref 38–126)
Anion gap: 11 (ref 5–15)
BUN: 38 mg/dL — ABNORMAL HIGH (ref 8–23)
CO2: 27 mmol/L (ref 22–32)
Calcium: 10.2 mg/dL (ref 8.9–10.3)
Chloride: 102 mmol/L (ref 98–111)
Creatinine: 1.56 mg/dL — ABNORMAL HIGH (ref 0.61–1.24)
GFR, Est AFR Am: 51 mL/min — ABNORMAL LOW (ref >60)
GFR, Estimated: 44 mL/min — ABNORMAL LOW (ref >60)
Glucose, Bld: 124 mg/dL — ABNORMAL HIGH (ref 70–99)
Potassium: 4.1 mmol/L (ref 3.5–5.1)
Sodium: 140 mmol/L (ref 135–145)
Total Bilirubin: 0.4 mg/dL (ref 0.3–1.2)
Total Protein: 7 g/dL (ref 6.5–8.1)

## 2018-03-06 MED ORDER — HEPARIN SOD (PORK) LOCK FLUSH 100 UNIT/ML IV SOLN
500.0000 [IU] | Freq: Once | INTRAVENOUS | Status: AC | PRN
  Administered 2018-03-06: 500 [IU]
  Filled 2018-03-06: qty 5

## 2018-03-06 MED ORDER — SODIUM CHLORIDE 0.9 % IV SOLN
Freq: Once | INTRAVENOUS | Status: AC
  Administered 2018-03-06: 09:00:00 via INTRAVENOUS
  Filled 2018-03-06: qty 250

## 2018-03-06 MED ORDER — DIPHENHYDRAMINE HCL 50 MG/ML IJ SOLN
INTRAMUSCULAR | Status: AC
  Filled 2018-03-06: qty 1

## 2018-03-06 MED ORDER — SODIUM CHLORIDE 0.9% FLUSH
10.0000 mL | INTRAVENOUS | Status: DC | PRN
  Administered 2018-03-06: 10 mL
  Filled 2018-03-06: qty 10

## 2018-03-06 MED ORDER — PALONOSETRON HCL INJECTION 0.25 MG/5ML
0.2500 mg | Freq: Once | INTRAVENOUS | Status: AC
  Administered 2018-03-06: 0.25 mg via INTRAVENOUS

## 2018-03-06 MED ORDER — SODIUM CHLORIDE 0.9 % IV SOLN
182.0000 mg | Freq: Once | INTRAVENOUS | Status: AC
  Administered 2018-03-06: 180 mg via INTRAVENOUS
  Filled 2018-03-06: qty 18

## 2018-03-06 MED ORDER — FAMOTIDINE IN NACL 20-0.9 MG/50ML-% IV SOLN
INTRAVENOUS | Status: AC
  Filled 2018-03-06: qty 50

## 2018-03-06 MED ORDER — SODIUM CHLORIDE 0.9 % IV SOLN
45.0000 mg/m2 | Freq: Once | INTRAVENOUS | Status: AC
  Administered 2018-03-06: 108 mg via INTRAVENOUS
  Filled 2018-03-06: qty 18

## 2018-03-06 MED ORDER — SODIUM CHLORIDE 0.9 % IV SOLN
20.0000 mg | Freq: Once | INTRAVENOUS | Status: AC
  Administered 2018-03-06: 20 mg via INTRAVENOUS
  Filled 2018-03-06: qty 2

## 2018-03-06 MED ORDER — FAMOTIDINE IN NACL 20-0.9 MG/50ML-% IV SOLN
20.0000 mg | Freq: Once | INTRAVENOUS | Status: AC
  Administered 2018-03-06: 20 mg via INTRAVENOUS

## 2018-03-06 MED ORDER — DIPHENHYDRAMINE HCL 50 MG/ML IJ SOLN
50.0000 mg | Freq: Once | INTRAMUSCULAR | Status: AC
  Administered 2018-03-06: 50 mg via INTRAVENOUS

## 2018-03-06 MED ORDER — DEXAMETHASONE SODIUM PHOSPHATE 10 MG/ML IJ SOLN
INTRAMUSCULAR | Status: AC
  Filled 2018-03-06: qty 1

## 2018-03-06 MED ORDER — PALONOSETRON HCL INJECTION 0.25 MG/5ML
INTRAVENOUS | Status: AC
  Filled 2018-03-06: qty 5

## 2018-03-06 NOTE — Progress Notes (Signed)
Per Cira Rue, NP, ok to treat with Scr of 1.56.

## 2018-03-06 NOTE — Patient Instructions (Signed)
[facility] Discharge Instructions for Patients Receiving Chemotherapy  Today you received the following chemotherapy agents paclitaxel (Taxol) and carboplatin (Paraplatin).   To help prevent nausea and vomiting after your treatment, we encourage you to take your nausea medication as directed by your physician.    If you develop nausea and vomiting that is not controlled by your nausea medication, call the clinic.   BELOW ARE SYMPTOMS THAT SHOULD BE REPORTED IMMEDIATELY:  *FEVER GREATER THAN 100.5 F  *CHILLS WITH OR WITHOUT FEVER  NAUSEA AND VOMITING THAT IS NOT CONTROLLED WITH YOUR NAUSEA MEDICATION  *UNUSUAL SHORTNESS OF BREATH  *UNUSUAL BRUISING OR BLEEDING  TENDERNESS IN MOUTH AND THROAT WITH OR WITHOUT PRESENCE OF ULCERS  *URINARY PROBLEMS  *BOWEL PROBLEMS  UNUSUAL RASH Items with * indicate a potential emergency and should be followed up as soon as possible.  Feel free to call the clinic should you have any questions or concerns. The clinic phone number is [phone].  Please show the [facility] at check-in to the Emergency Department and triage nurse.

## 2018-03-06 NOTE — Progress Notes (Signed)
Dr. [name] made aware of elevated BP during taxol transfusion (pre transfusion 142/72, 30 minutes into transfusion 171/84, post transfusion 175/80). Pt asymptomatic. Will continue to follow.

## 2018-03-06 NOTE — Progress Notes (Signed)
  Radiation Oncology         [phone]) [phone] ________________________________  Name: Darrell [name] MRN: [id number]  Date: 02/22/2018  DOB: 08/09/1945  SIMULATION AND TREATMENT PLANNING NOTE  DIAGNOSIS:     ICD-10-CM   1. 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  Identity was confirmed.  All relevant records and images related to the planned course of therapy were reviewed.   Written consent to proceed with treatment was confirmed which was freely given after reviewing the details related to the planned course of therapy had been reviewed with the patient.  Then, the patient was set-up in a stable reproducible  supine position for radiation therapy.  CT images were obtained.  Surface markings were placed.    Medically necessary complex treatment device(s) for immobilization:  Vac-lock bag.   The CT images were loaded into the planning software.  Then the target and avoidance structures were contoured.  Treatment planning then occurred.  The radiation prescription was entered and confirmed.  A total of 4 complex treatment devices were fabricated which relate to the designed radiation treatment fields. Additional reduced fields will be used as necessary to improve the dose homogeneity of the plan. Each of these customized fields/ complex treatment devices will be used on a daily basis during the radiation course. I have requested : 3D Simulation  I have requested a DVH of the following structures: target volume, spinal cord, lungs, heart.   The patient will undergo daily image guidance to ensure accurate localization of the target, and adequate minimize dose to the normal surrounding structures in close proximity to the target.  PLAN:  The patient will receive 60 Gy in 30 fractions initially. The patient will then receive a 6 Gy boost for a final dose of 66 Gy.  Special treatment procedure The patient will  also receive concurrent chemotherapy during the treatment. The patient may therefore experience increased toxicity or side effects and the patient will be monitored for such problems. This may require extra lab work as necessary. This therefore constitutes a special treatment procedure.   ________________________________   Jodelle Gross, MD, PhD

## 2018-03-06 NOTE — Progress Notes (Signed)
  Radiation Oncology         [phone]) [phone] ________________________________  Name: [name] MRN: [id number]  Date: 02/22/2018  DOB: 1945/07/19  RESPIRATORY MOTION MANAGEMENT SIMULATION  NARRATIVE:  In order to account for effect of respiratory motion on target structures and other organs in the planning and delivery of radiotherapy, this patient underwent respiratory motion management simulation.  To accomplish this, when the patient was brought to the CT simulation planning suite, 4D respiratoy motion management CT images were obtained.  The CT images were loaded into the planning software.  Then, using a variety of tools including Cine, MIP, and standard views, the target volume and planning target volumes (PTV) were delineated.  Avoidance structures were contoured.  Treatment planning then occurred.  Dose volume histograms were generated and reviewed for each of the requested structure.  The resulting plan was carefully reviewed and approved today.   ------------------------------------------------  Jodelle Gross, MD, PhD

## 2018-03-07 ENCOUNTER — Telehealth: Payer: Self-pay

## 2018-03-07 ENCOUNTER — Ambulatory Visit
Admission: RE | Admit: 2018-03-07 | Discharge: 2018-03-07 | Disposition: A | Discharge status: Home / Self Care | Payer: Medicare Other | Source: Ambulatory Visit | Attending: Radiation Oncology | Admitting: Radiation Oncology

## 2018-03-07 DIAGNOSIS — C3411 Malignant neoplasm of upper lobe, right bronchus or lung: Secondary | ICD-10-CM | POA: Diagnosis not present

## 2018-03-07 NOTE — Telephone Encounter (Signed)
Per 8/26 no los

## 2018-03-08 ENCOUNTER — Ambulatory Visit
Admission: RE | Admit: 2018-03-08 | Discharge: 2018-03-08 | Disposition: A | Discharge status: Home / Self Care | Payer: Medicare Other | Source: Ambulatory Visit | Attending: Radiation Oncology | Admitting: Radiation Oncology

## 2018-03-08 DIAGNOSIS — C3411 Malignant neoplasm of upper lobe, right bronchus or lung: Secondary | ICD-10-CM | POA: Diagnosis not present

## 2018-03-09 ENCOUNTER — Ambulatory Visit
Admission: RE | Admit: 2018-03-09 | Discharge: 2018-03-09 | Disposition: A | Discharge status: Home / Self Care | Payer: Medicare Other | Source: Ambulatory Visit | Attending: Radiation Oncology | Admitting: Radiation Oncology

## 2018-03-09 DIAGNOSIS — C3411 Malignant neoplasm of upper lobe, right bronchus or lung: Secondary | ICD-10-CM | POA: Diagnosis not present

## 2018-03-10 ENCOUNTER — Ambulatory Visit
Admission: RE | Admit: 2018-03-10 | Discharge: 2018-03-10 | Disposition: A | Discharge status: Home / Self Care | Payer: Medicare Other | Source: Ambulatory Visit | Attending: Radiation Oncology | Admitting: Radiation Oncology

## 2018-03-10 DIAGNOSIS — C3411 Malignant neoplasm of upper lobe, right bronchus or lung: Secondary | ICD-10-CM | POA: Diagnosis not present

## 2018-03-14 ENCOUNTER — Inpatient Hospital Stay: Payer: Medicare Other | Attending: Internal Medicine

## 2018-03-14 ENCOUNTER — Inpatient Hospital Stay: Payer: Medicare Other

## 2018-03-14 ENCOUNTER — Ambulatory Visit
Admission: RE | Admit: 2018-03-14 | Discharge: 2018-03-14 | Disposition: A | Discharge status: Home / Self Care | Payer: Medicare Other | Source: Ambulatory Visit | Attending: Radiation Oncology | Admitting: Radiation Oncology

## 2018-03-14 VITALS — BP 152/80 | HR 67 | Temp 97.8°F | Resp 18 | Ht 68.0 in | Wt 257.8 lb

## 2018-03-14 DIAGNOSIS — Z5111 Encounter for antineoplastic chemotherapy: Secondary | ICD-10-CM | POA: Diagnosis not present

## 2018-03-14 DIAGNOSIS — E119 Type 2 diabetes mellitus without complications: Secondary | ICD-10-CM | POA: Diagnosis not present

## 2018-03-14 DIAGNOSIS — E785 Hyperlipidemia, unspecified: Secondary | ICD-10-CM | POA: Insufficient documentation

## 2018-03-14 DIAGNOSIS — C3401 Malignant neoplasm of right main bronchus: Secondary | ICD-10-CM | POA: Insufficient documentation

## 2018-03-14 DIAGNOSIS — Z794 Long term (current) use of insulin: Secondary | ICD-10-CM | POA: Diagnosis not present

## 2018-03-14 DIAGNOSIS — L03115 Cellulitis of right lower limb: Secondary | ICD-10-CM | POA: Diagnosis not present

## 2018-03-14 DIAGNOSIS — N189 Chronic kidney disease, unspecified: Secondary | ICD-10-CM | POA: Diagnosis not present

## 2018-03-14 DIAGNOSIS — Z510 Encounter for antineoplastic radiation therapy: Secondary | ICD-10-CM | POA: Insufficient documentation

## 2018-03-14 DIAGNOSIS — C3411 Malignant neoplasm of upper lobe, right bronchus or lung: Secondary | ICD-10-CM | POA: Diagnosis not present

## 2018-03-14 DIAGNOSIS — Z79899 Other long term (current) drug therapy: Secondary | ICD-10-CM | POA: Diagnosis not present

## 2018-03-14 DIAGNOSIS — C3491 Malignant neoplasm of unspecified part of right bronchus or lung: Secondary | ICD-10-CM

## 2018-03-14 DIAGNOSIS — I129 Hypertensive chronic kidney disease with stage 1 through stage 4 chronic kidney disease, or unspecified chronic kidney disease: Secondary | ICD-10-CM | POA: Insufficient documentation

## 2018-03-14 DIAGNOSIS — R1310 Dysphagia, unspecified: Secondary | ICD-10-CM | POA: Diagnosis not present

## 2018-03-14 LAB — CMP (CANCER CENTER ONLY)
ALT: 9 U/L (ref 0–44)
AST: 13 U/L — ABNORMAL LOW (ref 15–41)
Albumin: 3.3 g/dL — ABNORMAL LOW (ref 3.5–5.0)
Alkaline Phosphatase: 38 U/L (ref 38–126)
Anion gap: 9 (ref 5–15)
BUN: 32 mg/dL — ABNORMAL HIGH (ref 8–23)
CO2: 27 mmol/L (ref 22–32)
Calcium: 9.8 mg/dL (ref 8.9–10.3)
Chloride: 101 mmol/L (ref 98–111)
Creatinine: 1.54 mg/dL — ABNORMAL HIGH (ref 0.61–1.24)
GFR, Est AFR Am: 51 mL/min — ABNORMAL LOW (ref >60)
GFR, Estimated: 44 mL/min — ABNORMAL LOW (ref >60)
Glucose, Bld: 98 mg/dL (ref 70–99)
Potassium: 4 mmol/L (ref 3.5–5.1)
Sodium: 137 mmol/L (ref 135–145)
Total Bilirubin: 0.4 mg/dL (ref 0.3–1.2)
Total Protein: 6.7 g/dL (ref 6.5–8.1)

## 2018-03-14 LAB — CBC WITH DIFFERENTIAL (CANCER CENTER ONLY)
Basophils Absolute: 0.1 10*3/uL (ref 0.0–0.1)
Basophils Relative: 3 %
Eosinophils Absolute: 0.1 10*3/uL (ref 0.0–0.5)
Eosinophils Relative: 5 %
HCT: 29.8 % — ABNORMAL LOW (ref 38.4–49.9)
Hemoglobin: 9.8 g/dL — ABNORMAL LOW (ref 13.0–17.1)
Lymphocytes Relative: 27 %
Lymphs Abs: 0.5 10*3/uL — ABNORMAL LOW (ref 0.9–3.3)
MCH: 30.5 pg (ref 27.2–33.4)
MCHC: 32.9 g/dL (ref 32.0–36.0)
MCV: 92.8 fL (ref 79.3–98.0)
Monocytes Absolute: 0.3 10*3/uL (ref 0.1–0.9)
Monocytes Relative: 14 %
Neutro Abs: 1 10*3/uL — ABNORMAL LOW (ref 1.5–6.5)
Neutrophils Relative %: 51 %
Platelet Count: 196 10*3/uL (ref 140–400)
RBC: 3.21 MIL/uL — ABNORMAL LOW (ref 4.20–5.82)
RDW: 14.5 % (ref 11.0–14.6)
WBC Count: 2 10*3/uL — ABNORMAL LOW (ref 4.0–10.3)

## 2018-03-14 MED ORDER — SODIUM CHLORIDE 0.9 % IV SOLN
182.0000 mg | Freq: Once | INTRAVENOUS | Status: AC
  Administered 2018-03-14: 180 mg via INTRAVENOUS
  Filled 2018-03-14: qty 18

## 2018-03-14 MED ORDER — SODIUM CHLORIDE 0.9 % IV SOLN
20.0000 mg | Freq: Once | INTRAVENOUS | Status: AC
  Administered 2018-03-14: 20 mg via INTRAVENOUS
  Filled 2018-03-14: qty 2

## 2018-03-14 MED ORDER — FAMOTIDINE IN NACL 20-0.9 MG/50ML-% IV SOLN
INTRAVENOUS | Status: AC
  Filled 2018-03-14: qty 50

## 2018-03-14 MED ORDER — DIPHENHYDRAMINE HCL 50 MG/ML IJ SOLN
INTRAMUSCULAR | Status: AC
  Filled 2018-03-14: qty 1

## 2018-03-14 MED ORDER — FAMOTIDINE IN NACL 20-0.9 MG/50ML-% IV SOLN
20.0000 mg | Freq: Once | INTRAVENOUS | Status: AC
  Administered 2018-03-14: 20 mg via INTRAVENOUS

## 2018-03-14 MED ORDER — HEPARIN SOD (PORK) LOCK FLUSH 100 UNIT/ML IV SOLN
500.0000 [IU] | Freq: Once | INTRAVENOUS | Status: AC | PRN
  Administered 2018-03-14: 500 [IU]
  Filled 2018-03-14: qty 5

## 2018-03-14 MED ORDER — SODIUM CHLORIDE 0.9 % IV SOLN
45.0000 mg/m2 | Freq: Once | INTRAVENOUS | Status: AC
  Administered 2018-03-14: 108 mg via INTRAVENOUS
  Filled 2018-03-14: qty 18

## 2018-03-14 MED ORDER — DIPHENHYDRAMINE HCL 50 MG/ML IJ SOLN
50.0000 mg | Freq: Once | INTRAMUSCULAR | Status: AC
  Administered 2018-03-14: 50 mg via INTRAVENOUS

## 2018-03-14 MED ORDER — SODIUM CHLORIDE 0.9% FLUSH
10.0000 mL | INTRAVENOUS | Status: DC | PRN
  Administered 2018-03-14: 10 mL
  Filled 2018-03-14: qty 10

## 2018-03-14 MED ORDER — SODIUM CHLORIDE 0.9 % IV SOLN
Freq: Once | INTRAVENOUS | Status: AC
  Administered 2018-03-14: 14:00:00 via INTRAVENOUS
  Filled 2018-03-14: qty 250

## 2018-03-14 MED ORDER — PALONOSETRON HCL INJECTION 0.25 MG/5ML
INTRAVENOUS | Status: AC
  Filled 2018-03-14: qty 5

## 2018-03-14 MED ORDER — PALONOSETRON HCL INJECTION 0.25 MG/5ML
0.2500 mg | Freq: Once | INTRAVENOUS | Status: AC
  Administered 2018-03-14: 0.25 mg via INTRAVENOUS

## 2018-03-14 NOTE — Patient Instructions (Addendum)
[facility] Discharge Instructions for Patients Receiving Chemotherapy  Today you received the following chemotherapy agents: Paclitaxel (Taxol) and Carboplatin (Paraplatin)  To help prevent nausea and vomiting after your treatment, we encourage you to take your nausea medication as directed.    If you develop nausea and vomiting that is not controlled by your nausea medication, call the clinic.   BELOW ARE SYMPTOMS THAT SHOULD BE REPORTED IMMEDIATELY:  *FEVER GREATER THAN 100.5 F  *CHILLS WITH OR WITHOUT FEVER  NAUSEA AND VOMITING THAT IS NOT CONTROLLED WITH YOUR NAUSEA MEDICATION  *UNUSUAL SHORTNESS OF BREATH  *UNUSUAL BRUISING OR BLEEDING  TENDERNESS IN MOUTH AND THROAT WITH OR WITHOUT PRESENCE OF ULCERS  *URINARY PROBLEMS  *BOWEL PROBLEMS  UNUSUAL RASH Items with * indicate a potential emergency and should be followed up as soon as possible.  Feel free to call the clinic should you have any questions or concerns. The clinic phone number is [phone].  Please show the [facility] at check-in to the Emergency Department and triage nurse.   Managing Low Blood Counts During Cancer Treatment Cancer treatments such as chemotherapy and radiation can sometimes cause a drop in the supply of blood cells in the body, including red blood cells, white blood cells, and platelets. These blood cells are produced in the body and are released into the blood to perform specific functions:  Red blood cells carry gases such as oxygen and carbon dioxide to and from your lungs.  White blood cells help protect you from infection.  Platelets help your body to form blood clots to prevent and control bleeding.  When cancer treatments cause a drop in blood cell counts, your body may not have enough cells to keep up its normal functions. Symptoms or problems that may result will vary depending on which type of blood cells the treatment is affecting. If your blood counts are  low, you can take steps to help manage any problems. How can low blood counts affect me? Low blood counts have various effects depending on the type of blood cells involved:  If you have a low number of red blood cells, you have a condition called anemia. This can cause symptoms such as: ? Feeling tired and weak. ? Feeling light-headed. ? Being short of breath.  If you have a low number of white blood cells, you may be at higher risk for infections.  If you have a low number of platelets, you may bleed more easily, or your body may have trouble stopping any bleeding. You may also have more bruising.  How to manage symptoms or prevent problems from a low blood count If you have a low blood count, you can take steps to manage symptoms or prevent problems that may develop. The steps to take will depend on which type of blood cell is low. Low red blood cells Take these steps to help manage the symptoms of anemia:  Go for a walk or do some light exercise each day.  Take short naps during the day.  Eat foods that contain a lot of iron and protein. These include leafy vegetables, meat and fish, beans, sweet potatoes, and dried fruit such as prunes, raisins, and apricots.  Ask for help with errands and with work that needs to be done around the house. It is important to save your energy.  Take vitamins or supplements-such as iron, vitamin V37, or folic acid-as told by your health care provider.  Practice relaxation techniques, such as  yoga or meditation.  Low white blood cells Take these steps to help prevent infections:  Wash your hands often with warm, soapy water.  Avoid crowds of people and any person who has the flu or a fever.  Take care when cleaning yourself after using the bathroom. Tell your health care provider if you have any rectal sores or bleeding.  Avoid dental work. Check your mouth each day for sores or signs of infection.  Do not share utensils.  Avoid contact  with pet waste. Wash your hands after handling pets.  If you get a scrape or cut, clean it thoroughly right away.  Avoid fresh plants or dried flowers.  Do not swim or wade in lakes, ponds, rivers, water parks, or hot tubs.  Follow food safety guidelines. Cook meat thoroughly and wash all raw fruits and vegetables.  You may be instructed to wear a mask when around others to protect yourself.  Low platelets Take these steps to help prevent or control bleeding and bruising:  Use an electric razor for shaving instead of a blade.  Use a soft toothbrush and be careful during oral care. Talk with your cancer care team about whether you should avoid flossing. If your mouth is bleeding, rinse it with ice water.  Avoid activities that could cause injury, such as contact sports.  Talk with your health care provider about using laxatives or stool softeners to avoid constipation.  Do not use medicines such as ibuprofen, aspirin, or naproxen unless your health care provider tells you to.  Limit alcohol use.  Monitor any bleeding closely. If you start bleeding, hold pressure on the area for 5 minutes to stop the bleeding. Bleeding that does not stop is considered an emergency.  What treatments can help increase a low blood count? If needed, your health care provider may recommend treatment for a low blood count. Treatment will depend on the type of blood cell that is low and the severity of your condition. Treatment options may include:  Taking medicines to help stimulate the growth of blood cells. This is an option for treating a low red blood cell count. Your health care provider may also recommend that you take iron, folic acid, or vitamin B12 supplements.  Making dietary changes. Including more iron and protein in your diet can help stimulate the growth of red blood cells.  Adjusting your current medicines to help raise blood counts.  Making changes to your treatment plan.  Having a  blood transfusion. This may be done if your blood count is very low.  Contact a health care provider if:  You feel extremely tired and weak.  You have more bruising or bleeding.  You feel ill or you develop a cough.  You have swelling or redness.  You have mouth sores or a sore throat.  You have painful urination or you have blood in your urine or stool.  You are thinking of taking any new supplements or vitamins or making dietary changes. Get help right away if:  You are short of breath, have chest pain, or feel dizzy.  You have a fever or chills.  You have abdominal pain or diarrhea.  You have bleeding that will not stop. Summary  Cancer treatments such as chemotherapy and radiation can sometimes cause a drop in the supply of blood cells in the body, including red blood cells, white blood cells, and platelets.  If you have a low blood count, you can take steps to manage symptoms or  prevent problems that may develop.  Depending on which type of blood cell is low, you may need to take steps to prevent infection, prevent bleeding, or manage symptoms that may develop.  If needed, your health care provider may recommend treatment for a low blood count. This information is not intended to replace advice given to you by your health care provider. Make sure you discuss any questions you have with your health care provider. Document Released: 02/21/2016 Document Revised: 02/21/2016 Document Reviewed: 02/21/2016 Elsevier Interactive Patient Education  [employer].

## 2018-03-14 NOTE — Progress Notes (Signed)
Per Erasmo Downer Curcio-NP: OK to treat with lab results from today

## 2018-03-15 ENCOUNTER — Ambulatory Visit
Admission: RE | Admit: 2018-03-15 | Discharge: 2018-03-15 | Disposition: A | Discharge status: Home / Self Care | Payer: Medicare Other | Source: Ambulatory Visit | Attending: Radiation Oncology | Admitting: Radiation Oncology

## 2018-03-15 DIAGNOSIS — C3411 Malignant neoplasm of upper lobe, right bronchus or lung: Secondary | ICD-10-CM | POA: Diagnosis not present

## 2018-03-16 ENCOUNTER — Ambulatory Visit
Admission: RE | Admit: 2018-03-16 | Discharge: 2018-03-16 | Disposition: A | Discharge status: Home / Self Care | Payer: Medicare Other | Source: Ambulatory Visit | Attending: Radiation Oncology | Admitting: Radiation Oncology

## 2018-03-16 DIAGNOSIS — C3411 Malignant neoplasm of upper lobe, right bronchus or lung: Secondary | ICD-10-CM | POA: Diagnosis not present

## 2018-03-17 ENCOUNTER — Encounter: Payer: Self-pay | Admitting: *Deleted

## 2018-03-17 ENCOUNTER — Ambulatory Visit
Admission: RE | Admit: 2018-03-17 | Discharge: 2018-03-17 | Disposition: A | Discharge status: Home / Self Care | Payer: Medicare Other | Source: Ambulatory Visit | Attending: Radiation Oncology | Admitting: Radiation Oncology

## 2018-03-17 DIAGNOSIS — C3411 Malignant neoplasm of upper lobe, right bronchus or lung: Secondary | ICD-10-CM | POA: Diagnosis not present

## 2018-03-17 NOTE — Assessment & Plan Note (Addendum)
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 He is currently on a course of concurrent chemoradiation with weekly carboplatin for AUC of 2 and paclitaxel 45 mg/M2.  Status post 3 cycles which he is tolerating fairly well with the exception of mild odynophagia.  Labs have been reviewed.  ANC is 1.3.  This is adequate to proceed with treatment today as scheduled.   The patient will follow-up next week for labs and chemotherapy and will have a follow-up visit in 2 weeks for evaluation prior to cycle #6.  I have recommended that he talk with radiation oncology regarding the odynophagia.  The patient was advised to call immediately if he has any concerning symptoms in the interval. The patient voices understanding of current disease status and treatment options and is in agreement with the current care plan.  All questions were answered. The patient knows to call the clinic with any problems, questions or concerns. We can certainly see the patient much sooner if necessary.

## 2018-03-17 NOTE — Progress Notes (Signed)
[facility] OFFICE PROGRESS NOTE  Robyne Peers, MD [address] [place] [address]  DIAGNOSIS:Stage IIb/IIIa (T2b, N0/N2,M0),non-small cell lung cancer, squamous cell carcinoma diagnosed in July 2019 and presented with large right hilar mass with questionable mediastinal invasion  PRIOR THERAPY:None  CURRENT THERAPY:Concurrent chemoradiation with weekly carboplatin for AUC of 2 and paclitaxel 45 mg/M2. First dose 02/27/2018.  Status post 3 cycles.  INTERVAL HISTORY: Darrell [name] 71 y.o. male returns for routine follow-up visit accompanied by his wife.  The patient is feeling fine today and has no specific complaints except for intermittent indigestion.  He denies fevers and chills.  Denies chest pain, shortness of breath, cough, hemoptysis.  Denies nausea, vomiting, constipation, diarrhea.  Denies recent weight loss or night sweats.  The patient has been tolerating his chemotherapy well overall.  He is here for evaluation prior to starting cycle #4 of his treatment.  MEDICAL HISTORY: Past Medical History:  Diagnosis Date  . Cellulitis of right lower extremity   . Chronic kidney disease   . Diabetes mellitus without complication ([place])   . Dyslipidemia   . Hypertension     ALLERGIES:  is allergic to lisinopril.  MEDICATIONS:  Current Outpatient Medications  Medication Sig Dispense Refill  . ASTAXANTHIN PO Take 1 capsule by mouth daily.     . Blood Glucose Monitoring Suppl (ONE TOUCH ULTRA MINI) w/Device KIT 1 kit by Other route once.     . carvedilol (COREG) 6.25 MG tablet Take 6.25 mg by mouth 2 (two) times daily with a meal.     . Cinnamon Bark POWD Take 1,000 mg by mouth 2 (two) times daily.     . cloNIDine (CATAPRES) 0.1 MG tablet Take 0.1 mg by mouth 2 (two) times daily.     Marland Kitchen escitalopram (LEXAPRO) 10 MG tablet Take 10 mg by mouth daily.     . fenofibrate 160 MG tablet Take 160 mg by mouth daily.     . Glucosamine Sulfate 1000 MG  TABS Take 2,000 mg by mouth daily.     . hydrochlorothiazide (HYDRODIURIL) 25 MG tablet Take 25 mg by mouth daily.     . Insulin Detemir (LEVEMIR FLEXTOUCH) 100 UNIT/ML Pen Inject 10 Units into the skin daily.     . Insulin Pen Needle (FIFTY50 PEN NEEDLES) 31G X 8 MM MISC USE AS DIRECTED    . Lancets MISC by Does not apply route.    . lidocaine-prilocaine (EMLA) cream Apply 1 application topically as needed. 30 g 0  . losartan (COZAAR) 50 MG tablet Take 50 mg by mouth daily.     . magnesium oxide (MAG-OX) 400 MG tablet Take 400 mg by mouth daily.    . metFORMIN (GLUCOPHAGE) 1000 MG tablet Take 1,000 mg by mouth 2 (two) times daily with a meal.     . Multiple Vitamin (MULTI-VITAMINS) TABS Take 1 tablet by mouth daily.     . Omega-3 1000 MG CAPS Take 1,200 mg by mouth 2 (two) times daily.     . pioglitazone (ACTOS) 45 MG tablet Take 45 mg by mouth daily.     . pravastatin (PRAVACHOL) 40 MG tablet Take 40 mg by mouth daily.     . prochlorperazine (COMPAZINE) 10 MG tablet TAKE 1 TABLET BY MOUTH EVERY 6 HOURS AS NEEDED FOR NAUSEA OR VOMITING 385 tablet 0  . traMADol (ULTRAM) 50 MG tablet Take 50 mg by mouth every 6 (six) hours as needed for severe  pain.      No current facility-administered medications for this visit.    Facility-Administered Medications Ordered in Other Visits  Medication Dose Route Frequency Provider Last Rate Last Dose  . CARBOplatin (PARAPLATIN) 180 mg in sodium chloride 0.9 % 250 mL chemo infusion  180 mg Intravenous Once Curt Bears, MD      . dexamethasone (DECADRON) 20 mg in sodium chloride 0.9 % 50 mL IVPB  20 mg Intravenous Once Curt Bears, MD 220 mL/hr at 03/20/18 1049 20 mg at 03/20/18 1049  . heparin lock flush 100 unit/mL  500 Units Intracatheter Once PRN Curt Bears, MD      . PACLitaxel (TAXOL) 108 mg in sodium chloride 0.9 % 250 mL chemo infusion (</= '80mg'$ /m2)  45 mg/m2 (Treatment Plan Recorded) Intravenous Once Curt Bears, MD      . sodium  chloride flush (NS) 0.9 % injection 10 mL  10 mL Intracatheter PRN Curt Bears, MD        SURGICAL HISTORY:  Past Surgical History:  Procedure Laterality Date  . PORTACATH PLACEMENT Left 02/21/2018   Procedure: INSERTION PORT-A-CATH;  Surgeon: Grace Isaac, MD;  Location: [facility] OR;  Service: Thoracic;  Laterality: Left;    REVIEW OF SYSTEMS:   Review of Systems  Constitutional: Negative for appetite change, chills, fatigue, fever and unexpected weight change.  HENT:   Negative for mouth sores, nosebleeds, sore throat and trouble swallowing.   Eyes: Negative for eye problems and icterus.  Respiratory: Negative for cough, hemoptysis, shortness of breath and wheezing.   Cardiovascular: Negative for chest pain and leg swelling.  Gastrointestinal: Negative for abdominal pain, constipation, diarrhea, nausea and vomiting.  Positive for intermittent heartburn. Genitourinary: Negative for bladder incontinence, difficulty urinating, dysuria, frequency and hematuria.   Musculoskeletal: Negative for back pain, gait problem, neck pain and neck stiffness.  Skin: Negative for itching and rash.  Neurological: Negative for dizziness, extremity weakness, gait problem, headaches, light-headedness and seizures.  Hematological: Negative for adenopathy. Does not bruise/bleed easily.  Psychiatric/Behavioral: Negative for confusion, depression and sleep disturbance. The patient is not nervous/anxious.     PHYSICAL EXAMINATION:  Blood pressure (!) 149/72, pulse 77, temperature 97.7 F (36.5 C), temperature source Oral, resp. rate 12, height '5\' 8"'$  (1.727 m), weight 256 lb 14.4 oz (116.5 kg), SpO2 98 %.  ECOG PERFORMANCE STATUS: 1 - Symptomatic but completely ambulatory  Physical Exam  Constitutional: Oriented to person, place, and time and well-developed, well-nourished, and in no distress. No distress.  HENT:  Head: Normocephalic and atraumatic.  Mouth/Throat: Oropharynx is clear and moist. No  oropharyngeal exudate.  Eyes: Conjunctivae are normal. Right eye exhibits no discharge. Left eye exhibits no discharge. No scleral icterus.  Neck: Normal range of motion. Neck supple.  Cardiovascular: Normal rate, regular rhythm, normal heart sounds and intact distal pulses.   Pulmonary/Chest: Effort normal and breath sounds normal. No respiratory distress. No wheezes. No rales.  Abdominal: Soft. Bowel sounds are normal. Exhibits no distension and no mass. There is no tenderness.  Musculoskeletal: Normal range of motion. Exhibits no edema.  Lymphadenopathy:    No cervical adenopathy.  Neurological: Alert and oriented to person, place, and time. Exhibits normal muscle tone. Gait normal. Coordination normal.  Skin: Skin is warm and dry. No rash noted. Not diaphoretic. No erythema. No pallor.  Psychiatric: Mood, memory and judgment normal.  Vitals reviewed.  LABORATORY DATA: Lab Results  Component Value Date   WBC 1.8 (L) 03/20/2018   HGB 9.8 (L) 03/20/2018  HCT 29.3 (L) 03/20/2018   MCV 92.2 03/20/2018   PLT 182 03/20/2018      Chemistry      Component Value Date/Time   NA 137 03/20/2018 0844   K 3.7 03/20/2018 0844   CL 100 03/20/2018 0844   CO2 27 03/20/2018 0844   BUN 37 (H) 03/20/2018 0844   CREATININE 1.46 (H) 03/20/2018 0844      Component Value Date/Time   CALCIUM 9.8 03/20/2018 0844   ALKPHOS 39 03/20/2018 0844   AST 13 (L) 03/20/2018 0844   ALT 9 03/20/2018 0844   BILITOT 0.6 03/20/2018 0844       RADIOGRAPHIC STUDIES:  Dg Chest 2 View  Result Date: 02/21/2018 CLINICAL DATA:  Port-A-Cath placement EXAM: CHEST - 2 VIEW COMPARISON:  February 21, 2018 study obtained earlier in the day. PET-CT February 16, 2018 FINDINGS: Port-A-Cath tip is in the superior vena cava. No pneumothorax. There is soft tissue prominence in the right hilar region consistent with adenopathy. There is right perihilar atelectatic change. There is no new opacity. Heart size and pulmonary  vascularity are within normal limits. There is degenerative change in the thoracic spine. IMPRESSION: Port-A-Cath tip in superior vena cava. No pneumothorax. Right hilar adenopathy with right perihilar atelectasis, stable. No new opacity. Stable cardiac silhouette. Electronically Signed   By: Lowella Grip III M.D.   On: 02/21/2018 15:17   Dg Chest 2 View  Result Date: 02/21/2018 CLINICAL DATA:  Port-A-Cath placement. EXAM: CHEST - 2 VIEW COMPARISON:  PET-CT 02/16/2018.  Chest x-ray 01/31/2018. FINDINGS: Unchanged mediastinal right hilar fullness noted. Reference is made to PET-CT report of 02/16/2018. Heart size stable. Lungs are clear of acute infiltrates. No pleural effusion or pneumothorax. IMPRESSION: Unchanged mediastinal/right paratracheal fullness as previously described on PET-CT of 02/16/2018. No acute pulmonary disease identified. Electronically Signed   By: Marcello Moores  Register   On: 02/21/2018 12:49   Dg Cyndy Freeze Guide Cv Line-no Report  Result Date: 02/21/2018 Fluoroscopy was utilized by the requesting physician.  No radiographic interpretation.     ASSESSMENT/PLAN:  Stage III squamous cell cancer This is a very pleasant 72 year old white male with a stage IIb/IIIa non-small cell lung cancer, squamous cell carcinoma diagnosed in July 2019. He is currently on a course of concurrent chemoradiation with weekly carboplatin for AUC of 2 and paclitaxel 45 mg/M2.  Status post 3 cycles which he is tolerating fairly well with the exception of mild odynophagia.  Labs have been reviewed.  ANC is 1.3.  This is adequate to proceed with treatment today as scheduled.   The patient will follow-up next week for labs and chemotherapy and will have a follow-up visit in 2 weeks for evaluation prior to cycle #6.  I have recommended that he talk with radiation oncology regarding the odynophagia.  The patient was advised to call immediately if he has any concerning symptoms in the interval. The patient  voices understanding of current disease status and treatment options and is in agreement with the current care plan.  All questions were answered. The patient knows to call the clinic with any problems, questions or concerns. We can certainly see the patient much sooner if necessary.   No orders of the defined types were placed in this encounter.    Darrell Bussing, DNP, AGPCNP-BC, AOCNP 03/20/18

## 2018-03-20 ENCOUNTER — Inpatient Hospital Stay: Payer: Medicare Other

## 2018-03-20 ENCOUNTER — Inpatient Hospital Stay (HOSPITAL_BASED_OUTPATIENT_CLINIC_OR_DEPARTMENT_OTHER): Payer: Medicare Other | Admitting: Oncology

## 2018-03-20 ENCOUNTER — Ambulatory Visit
Admission: RE | Admit: 2018-03-20 | Discharge: 2018-03-20 | Disposition: A | Discharge status: Home / Self Care | Payer: Medicare Other | Source: Ambulatory Visit | Attending: Radiation Oncology | Admitting: Radiation Oncology

## 2018-03-20 ENCOUNTER — Telehealth: Payer: Self-pay | Admitting: Oncology

## 2018-03-20 ENCOUNTER — Other Ambulatory Visit: Payer: Self-pay

## 2018-03-20 ENCOUNTER — Encounter: Payer: Self-pay | Admitting: Oncology

## 2018-03-20 VITALS — BP 149/72 | HR 77 | Temp 97.7°F | Resp 12 | Ht 68.0 in | Wt 256.9 lb

## 2018-03-20 DIAGNOSIS — I129 Hypertensive chronic kidney disease with stage 1 through stage 4 chronic kidney disease, or unspecified chronic kidney disease: Secondary | ICD-10-CM | POA: Diagnosis not present

## 2018-03-20 DIAGNOSIS — C3411 Malignant neoplasm of upper lobe, right bronchus or lung: Secondary | ICD-10-CM

## 2018-03-20 DIAGNOSIS — Z794 Long term (current) use of insulin: Secondary | ICD-10-CM

## 2018-03-20 DIAGNOSIS — C3401 Malignant neoplasm of right main bronchus: Secondary | ICD-10-CM

## 2018-03-20 DIAGNOSIS — Z5111 Encounter for antineoplastic chemotherapy: Secondary | ICD-10-CM | POA: Diagnosis not present

## 2018-03-20 DIAGNOSIS — C3491 Malignant neoplasm of unspecified part of right bronchus or lung: Secondary | ICD-10-CM

## 2018-03-20 DIAGNOSIS — Z79899 Other long term (current) drug therapy: Secondary | ICD-10-CM

## 2018-03-20 DIAGNOSIS — R1310 Dysphagia, unspecified: Secondary | ICD-10-CM

## 2018-03-20 DIAGNOSIS — N189 Chronic kidney disease, unspecified: Secondary | ICD-10-CM

## 2018-03-20 DIAGNOSIS — L03115 Cellulitis of right lower limb: Secondary | ICD-10-CM

## 2018-03-20 DIAGNOSIS — E785 Hyperlipidemia, unspecified: Secondary | ICD-10-CM

## 2018-03-20 DIAGNOSIS — E119 Type 2 diabetes mellitus without complications: Secondary | ICD-10-CM

## 2018-03-20 LAB — CBC WITH DIFFERENTIAL (CANCER CENTER ONLY)
Basophils Absolute: 0.1 10*3/uL (ref 0.0–0.1)
Basophils Relative: 4 %
Eosinophils Absolute: 0.1 10*3/uL (ref 0.0–0.5)
Eosinophils Relative: 3 %
HCT: 29.3 % — ABNORMAL LOW (ref 38.4–49.9)
Hemoglobin: 9.8 g/dL — ABNORMAL LOW (ref 13.0–17.1)
Lymphocytes Relative: 15 %
Lymphs Abs: 0.3 10*3/uL — ABNORMAL LOW (ref 0.9–3.3)
MCH: 30.8 pg (ref 27.2–33.4)
MCHC: 33.3 g/dL (ref 32.0–36.0)
MCV: 92.2 fL (ref 79.3–98.0)
Monocytes Absolute: 0.1 10*3/uL (ref 0.1–0.9)
Monocytes Relative: 7 %
Neutro Abs: 1.3 10*3/uL — ABNORMAL LOW (ref 1.5–6.5)
Neutrophils Relative %: 71 %
Platelet Count: 182 10*3/uL (ref 140–400)
RBC: 3.18 MIL/uL — ABNORMAL LOW (ref 4.20–5.82)
RDW: 14.5 % (ref 11.0–14.6)
WBC Count: 1.8 10*3/uL — ABNORMAL LOW (ref 4.0–10.3)

## 2018-03-20 LAB — CMP (CANCER CENTER ONLY)
ALT: 9 U/L (ref 0–44)
AST: 13 U/L — ABNORMAL LOW (ref 15–41)
Albumin: 3.3 g/dL — ABNORMAL LOW (ref 3.5–5.0)
Alkaline Phosphatase: 39 U/L (ref 38–126)
Anion gap: 10 (ref 5–15)
BUN: 37 mg/dL — ABNORMAL HIGH (ref 8–23)
CO2: 27 mmol/L (ref 22–32)
Calcium: 9.8 mg/dL (ref 8.9–10.3)
Chloride: 100 mmol/L (ref 98–111)
Creatinine: 1.46 mg/dL — ABNORMAL HIGH (ref 0.61–1.24)
GFR, Est AFR Am: 55 mL/min — ABNORMAL LOW (ref >60)
GFR, Estimated: 47 mL/min — ABNORMAL LOW (ref >60)
Glucose, Bld: 135 mg/dL — ABNORMAL HIGH (ref 70–99)
Potassium: 3.7 mmol/L (ref 3.5–5.1)
Sodium: 137 mmol/L (ref 135–145)
Total Bilirubin: 0.6 mg/dL (ref 0.3–1.2)
Total Protein: 6.8 g/dL (ref 6.5–8.1)

## 2018-03-20 MED ORDER — FAMOTIDINE IN NACL 20-0.9 MG/50ML-% IV SOLN
20.0000 mg | Freq: Once | INTRAVENOUS | Status: AC
  Administered 2018-03-20: 20 mg via INTRAVENOUS

## 2018-03-20 MED ORDER — PALONOSETRON HCL INJECTION 0.25 MG/5ML
INTRAVENOUS | Status: AC
  Filled 2018-03-20: qty 5

## 2018-03-20 MED ORDER — SODIUM CHLORIDE 0.9 % IV SOLN
Freq: Once | INTRAVENOUS | Status: AC
  Administered 2018-03-20: 10:00:00 via INTRAVENOUS
  Filled 2018-03-20: qty 250

## 2018-03-20 MED ORDER — PALONOSETRON HCL INJECTION 0.25 MG/5ML
0.2500 mg | Freq: Once | INTRAVENOUS | Status: AC
  Administered 2018-03-20: 0.25 mg via INTRAVENOUS

## 2018-03-20 MED ORDER — FAMOTIDINE IN NACL 20-0.9 MG/50ML-% IV SOLN
INTRAVENOUS | Status: AC
  Filled 2018-03-20: qty 50

## 2018-03-20 MED ORDER — SODIUM CHLORIDE 0.9 % IV SOLN
182.0000 mg | Freq: Once | INTRAVENOUS | Status: AC
  Administered 2018-03-20: 180 mg via INTRAVENOUS
  Filled 2018-03-20: qty 18

## 2018-03-20 MED ORDER — DIPHENHYDRAMINE HCL 50 MG/ML IJ SOLN
INTRAMUSCULAR | Status: AC
  Filled 2018-03-20: qty 1

## 2018-03-20 MED ORDER — HEPARIN SOD (PORK) LOCK FLUSH 100 UNIT/ML IV SOLN
500.0000 [IU] | Freq: Once | INTRAVENOUS | Status: AC | PRN
  Administered 2018-03-20: 500 [IU]
  Filled 2018-03-20: qty 5

## 2018-03-20 MED ORDER — SODIUM CHLORIDE 0.9 % IV SOLN
45.0000 mg/m2 | Freq: Once | INTRAVENOUS | Status: AC
  Administered 2018-03-20: 108 mg via INTRAVENOUS
  Filled 2018-03-20: qty 18

## 2018-03-20 MED ORDER — SODIUM CHLORIDE 0.9% FLUSH
10.0000 mL | INTRAVENOUS | Status: DC | PRN
  Administered 2018-03-20: 10 mL
  Filled 2018-03-20: qty 10

## 2018-03-20 MED ORDER — SODIUM CHLORIDE 0.9 % IV SOLN
20.0000 mg | Freq: Once | INTRAVENOUS | Status: AC
  Administered 2018-03-20: 20 mg via INTRAVENOUS
  Filled 2018-03-20: qty 2

## 2018-03-20 MED ORDER — DIPHENHYDRAMINE HCL 50 MG/ML IJ SOLN
50.0000 mg | Freq: Once | INTRAMUSCULAR | Status: AC
  Administered 2018-03-20: 50 mg via INTRAVENOUS

## 2018-03-20 NOTE — Patient Instructions (Signed)
[facility] Discharge Instructions for Patients Receiving Chemotherapy  Today you received the following chemotherapy agents: Paclitaxel (Taxol) and Carboplatin (Paraplatin)  To help prevent nausea and vomiting after your treatment, we encourage you to take your nausea medication as directed.    If you develop nausea and vomiting that is not controlled by your nausea medication, call the clinic.   BELOW ARE SYMPTOMS THAT SHOULD BE REPORTED IMMEDIATELY:  *FEVER GREATER THAN 100.5 F  *CHILLS WITH OR WITHOUT FEVER  NAUSEA AND VOMITING THAT IS NOT CONTROLLED WITH YOUR NAUSEA MEDICATION  *UNUSUAL SHORTNESS OF BREATH  *UNUSUAL BRUISING OR BLEEDING  TENDERNESS IN MOUTH AND THROAT WITH OR WITHOUT PRESENCE OF ULCERS  *URINARY PROBLEMS  *BOWEL PROBLEMS  UNUSUAL RASH Items with * indicate a potential emergency and should be followed up as soon as possible.  Feel free to call the clinic should you have any questions or concerns. The clinic phone number is [phone].  Please show the [place] at check-in to the Emergency Department and triage nurse.   Managing Low Blood Counts During Cancer Treatment Cancer treatments such as chemotherapy and radiation can sometimes cause a drop in the supply of blood cells in the body, including red blood cells, white blood cells, and platelets. These blood cells are produced in the body and are released into the blood to perform specific functions:  Red blood cells carry gases such as oxygen and carbon dioxide to and from your lungs.  White blood cells help protect you from infection.  Platelets help your body to form blood clots to prevent and control bleeding.  When cancer treatments cause a drop in blood cell counts, your body may not have enough cells to keep up its normal functions. Symptoms or problems that may result will vary depending on which type of blood cells the treatment is affecting. If your blood counts are  low, you can take steps to help manage any problems. How can low blood counts affect me? Low blood counts have various effects depending on the type of blood cells involved:  If you have a low number of red blood cells, you have a condition called anemia. This can cause symptoms such as: ? Feeling tired and weak. ? Feeling light-headed. ? Being short of breath.  If you have a low number of white blood cells, you may be at higher risk for infections.  If you have a low number of platelets, you may bleed more easily, or your body may have trouble stopping any bleeding. You may also have more bruising.  How to manage symptoms or prevent problems from a low blood count If you have a low blood count, you can take steps to manage symptoms or prevent problems that may develop. The steps to take will depend on which type of blood cell is low. Low red blood cells Take these steps to help manage the symptoms of anemia:  Go for a walk or do some light exercise each day.  Take short naps during the day.  Eat foods that contain a lot of iron and protein. These include leafy vegetables, meat and fish, beans, sweet potatoes, and dried fruit such as prunes, raisins, and apricots.  Ask for help with errands and with work that needs to be done around the house. It is important to save your energy.  Take vitamins or supplements-such as iron, vitamin H88, or folic acid-as told by your health care provider.  Practice relaxation techniques, such as  yoga or meditation.  Low white blood cells Take these steps to help prevent infections:  Wash your hands often with warm, soapy water.  Avoid crowds of people and any person who has the flu or a fever.  Take care when cleaning yourself after using the bathroom. Tell your health care provider if you have any rectal sores or bleeding.  Avoid dental work. Check your mouth each day for sores or signs of infection.  Do not share utensils.  Avoid contact  with pet waste. Wash your hands after handling pets.  If you get a scrape or cut, clean it thoroughly right away.  Avoid fresh plants or dried flowers.  Do not swim or wade in lakes, ponds, rivers, water parks, or hot tubs.  Follow food safety guidelines. Cook meat thoroughly and wash all raw fruits and vegetables.  You may be instructed to wear a mask when around others to protect yourself.  Low platelets Take these steps to help prevent or control bleeding and bruising:  Use an electric razor for shaving instead of a blade.  Use a soft toothbrush and be careful during oral care. Talk with your cancer care team about whether you should avoid flossing. If your mouth is bleeding, rinse it with ice water.  Avoid activities that could cause injury, such as contact sports.  Talk with your health care provider about using laxatives or stool softeners to avoid constipation.  Do not use medicines such as ibuprofen, aspirin, or naproxen unless your health care provider tells you to.  Limit alcohol use.  Monitor any bleeding closely. If you start bleeding, hold pressure on the area for 5 minutes to stop the bleeding. Bleeding that does not stop is considered an emergency.  What treatments can help increase a low blood count? If needed, your health care provider may recommend treatment for a low blood count. Treatment will depend on the type of blood cell that is low and the severity of your condition. Treatment options may include:  Taking medicines to help stimulate the growth of blood cells. This is an option for treating a low red blood cell count. Your health care provider may also recommend that you take iron, folic acid, or vitamin B12 supplements.  Making dietary changes. Including more iron and protein in your diet can help stimulate the growth of red blood cells.  Adjusting your current medicines to help raise blood counts.  Making changes to your treatment plan.  Having a  blood transfusion. This may be done if your blood count is very low.  Contact a health care provider if:  You feel extremely tired and weak.  You have more bruising or bleeding.  You feel ill or you develop a cough.  You have swelling or redness.  You have mouth sores or a sore throat.  You have painful urination or you have blood in your urine or stool.  You are thinking of taking any new supplements or vitamins or making dietary changes. Get help right away if:  You are short of breath, have chest pain, or feel dizzy.  You have a fever or chills.  You have abdominal pain or diarrhea.  You have bleeding that will not stop. Summary  Cancer treatments such as chemotherapy and radiation can sometimes cause a drop in the supply of blood cells in the body, including red blood cells, white blood cells, and platelets.  If you have a low blood count, you can take steps to manage symptoms or  prevent problems that may develop.  Depending on which type of blood cell is low, you may need to take steps to prevent infection, prevent bleeding, or manage symptoms that may develop.  If needed, your health care provider may recommend treatment for a low blood count. This information is not intended to replace advice given to you by your health care provider. Make sure you discuss any questions you have with your health care provider. Document Released: 02/21/2016 Document Revised: 02/21/2016 Document Reviewed: 02/21/2016 Elsevier Interactive Patient Education  [employer].

## 2018-03-20 NOTE — Telephone Encounter (Signed)
Appts already scheduled per 9/9 - just added flush appts w/ labs .

## 2018-03-21 ENCOUNTER — Ambulatory Visit
Admission: RE | Admit: 2018-03-21 | Discharge: 2018-03-21 | Disposition: A | Discharge status: Home / Self Care | Payer: Medicare Other | Source: Ambulatory Visit | Attending: Radiation Oncology | Admitting: Radiation Oncology

## 2018-03-21 DIAGNOSIS — C3411 Malignant neoplasm of upper lobe, right bronchus or lung: Secondary | ICD-10-CM | POA: Diagnosis not present

## 2018-03-22 ENCOUNTER — Ambulatory Visit
Admission: RE | Admit: 2018-03-22 | Discharge: 2018-03-22 | Disposition: A | Discharge status: Home / Self Care | Payer: Medicare Other | Source: Ambulatory Visit | Attending: Radiation Oncology | Admitting: Radiation Oncology

## 2018-03-22 DIAGNOSIS — C3411 Malignant neoplasm of upper lobe, right bronchus or lung: Secondary | ICD-10-CM | POA: Diagnosis not present

## 2018-03-23 ENCOUNTER — Ambulatory Visit
Admission: RE | Admit: 2018-03-23 | Discharge: 2018-03-23 | Disposition: A | Discharge status: Home / Self Care | Payer: Medicare Other | Source: Ambulatory Visit | Attending: Radiation Oncology | Admitting: Radiation Oncology

## 2018-03-23 DIAGNOSIS — C3411 Malignant neoplasm of upper lobe, right bronchus or lung: Secondary | ICD-10-CM | POA: Diagnosis not present

## 2018-03-24 ENCOUNTER — Ambulatory Visit
Admission: RE | Admit: 2018-03-24 | Discharge: 2018-03-24 | Disposition: A | Discharge status: Home / Self Care | Payer: Medicare Other | Source: Ambulatory Visit | Attending: Radiation Oncology | Admitting: Radiation Oncology

## 2018-03-24 DIAGNOSIS — C3411 Malignant neoplasm of upper lobe, right bronchus or lung: Secondary | ICD-10-CM | POA: Diagnosis not present

## 2018-03-27 ENCOUNTER — Ambulatory Visit
Admission: RE | Admit: 2018-03-27 | Discharge: 2018-03-27 | Disposition: A | Discharge status: Home / Self Care | Payer: Medicare Other | Source: Ambulatory Visit | Attending: Radiation Oncology | Admitting: Radiation Oncology

## 2018-03-27 ENCOUNTER — Inpatient Hospital Stay: Payer: Medicare Other

## 2018-03-27 VITALS — BP 140/83 | HR 73 | Temp 97.7°F | Resp 17 | Ht 68.0 in | Wt 258.2 lb

## 2018-03-27 DIAGNOSIS — C3401 Malignant neoplasm of right main bronchus: Secondary | ICD-10-CM | POA: Diagnosis not present

## 2018-03-27 DIAGNOSIS — C3411 Malignant neoplasm of upper lobe, right bronchus or lung: Secondary | ICD-10-CM | POA: Diagnosis not present

## 2018-03-27 DIAGNOSIS — Z95828 Presence of other vascular implants and grafts: Secondary | ICD-10-CM | POA: Insufficient documentation

## 2018-03-27 DIAGNOSIS — C3491 Malignant neoplasm of unspecified part of right bronchus or lung: Secondary | ICD-10-CM

## 2018-03-27 LAB — CMP (CANCER CENTER ONLY)
ALT: 8 U/L (ref 0–44)
AST: 13 U/L — ABNORMAL LOW (ref 15–41)
Albumin: 3.2 g/dL — ABNORMAL LOW (ref 3.5–5.0)
Alkaline Phosphatase: 39 U/L (ref 38–126)
Anion gap: 9 (ref 5–15)
BUN: 34 mg/dL — ABNORMAL HIGH (ref 8–23)
CO2: 27 mmol/L (ref 22–32)
Calcium: 9.7 mg/dL (ref 8.9–10.3)
Chloride: 100 mmol/L (ref 98–111)
Creatinine: 1.46 mg/dL — ABNORMAL HIGH (ref 0.61–1.24)
GFR, Est AFR Am: 55 mL/min — ABNORMAL LOW (ref >60)
GFR, Estimated: 47 mL/min — ABNORMAL LOW (ref >60)
Glucose, Bld: 127 mg/dL — ABNORMAL HIGH (ref 70–99)
Potassium: 3.9 mmol/L (ref 3.5–5.1)
Sodium: 136 mmol/L (ref 135–145)
Total Bilirubin: 0.3 mg/dL (ref 0.3–1.2)
Total Protein: 6.8 g/dL (ref 6.5–8.1)

## 2018-03-27 LAB — CBC WITH DIFFERENTIAL (CANCER CENTER ONLY)
Basophils Absolute: 0 10*3/uL (ref 0.0–0.1)
Basophils Relative: 2 %
Eosinophils Absolute: 0.1 10*3/uL (ref 0.0–0.5)
Eosinophils Relative: 3 %
HCT: 28.6 % — ABNORMAL LOW (ref 38.4–49.9)
Hemoglobin: 9.4 g/dL — ABNORMAL LOW (ref 13.0–17.1)
Lymphocytes Relative: 18 %
Lymphs Abs: 0.3 10*3/uL — ABNORMAL LOW (ref 0.9–3.3)
MCH: 30 pg (ref 27.2–33.4)
MCHC: 32.9 g/dL (ref 32.0–36.0)
MCV: 91.4 fL (ref 79.3–98.0)
Monocytes Absolute: 0.1 10*3/uL (ref 0.1–0.9)
Monocytes Relative: 7 %
Neutro Abs: 1.3 10*3/uL — ABNORMAL LOW (ref 1.5–6.5)
Neutrophils Relative %: 70 %
Platelet Count: 125 10*3/uL — ABNORMAL LOW (ref 140–400)
RBC: 3.13 MIL/uL — ABNORMAL LOW (ref 4.20–5.82)
RDW: 14.4 % (ref 11.0–14.6)
WBC Count: 1.9 10*3/uL — ABNORMAL LOW (ref 4.0–10.3)

## 2018-03-27 MED ORDER — DIPHENHYDRAMINE HCL 50 MG/ML IJ SOLN
50.0000 mg | Freq: Once | INTRAMUSCULAR | Status: AC
  Administered 2018-03-27: 50 mg via INTRAVENOUS

## 2018-03-27 MED ORDER — SODIUM CHLORIDE 0.9 % IV SOLN
180.0000 mg | Freq: Once | INTRAVENOUS | Status: AC
  Administered 2018-03-27: 180 mg via INTRAVENOUS
  Filled 2018-03-27: qty 18

## 2018-03-27 MED ORDER — SODIUM CHLORIDE 0.9% FLUSH
10.0000 mL | INTRAVENOUS | Status: DC | PRN
  Filled 2018-03-27: qty 10

## 2018-03-27 MED ORDER — PALONOSETRON HCL INJECTION 0.25 MG/5ML
0.2500 mg | Freq: Once | INTRAVENOUS | Status: AC
  Administered 2018-03-27: 0.25 mg via INTRAVENOUS

## 2018-03-27 MED ORDER — DIPHENHYDRAMINE HCL 50 MG/ML IJ SOLN
INTRAMUSCULAR | Status: AC
  Filled 2018-03-27: qty 1

## 2018-03-27 MED ORDER — SODIUM CHLORIDE 0.9 % IV SOLN
Freq: Once | INTRAVENOUS | Status: AC
  Administered 2018-03-27: 14:00:00 via INTRAVENOUS
  Filled 2018-03-27: qty 250

## 2018-03-27 MED ORDER — PALONOSETRON HCL INJECTION 0.25 MG/5ML
INTRAVENOUS | Status: AC
  Filled 2018-03-27: qty 5

## 2018-03-27 MED ORDER — HEPARIN SOD (PORK) LOCK FLUSH 100 UNIT/ML IV SOLN
500.0000 [IU] | Freq: Once | INTRAVENOUS | Status: DC | PRN
  Filled 2018-03-27: qty 5

## 2018-03-27 MED ORDER — SODIUM CHLORIDE 0.9 % IV SOLN
45.0000 mg/m2 | Freq: Once | INTRAVENOUS | Status: AC
  Administered 2018-03-27: 108 mg via INTRAVENOUS
  Filled 2018-03-27: qty 18

## 2018-03-27 MED ORDER — SODIUM CHLORIDE 0.9% FLUSH
10.0000 mL | INTRAVENOUS | Status: DC | PRN
  Administered 2018-03-27: 10 mL
  Filled 2018-03-27: qty 10

## 2018-03-27 MED ORDER — SODIUM CHLORIDE 0.9 % IV SOLN
20.0000 mg | Freq: Once | INTRAVENOUS | Status: AC
  Administered 2018-03-27: 20 mg via INTRAVENOUS
  Filled 2018-03-27: qty 2

## 2018-03-27 MED ORDER — FAMOTIDINE IN NACL 20-0.9 MG/50ML-% IV SOLN
20.0000 mg | Freq: Once | INTRAVENOUS | Status: AC
  Administered 2018-03-27: 20 mg via INTRAVENOUS

## 2018-03-27 MED ORDER — FAMOTIDINE IN NACL 20-0.9 MG/50ML-% IV SOLN
INTRAVENOUS | Status: AC
  Filled 2018-03-27: qty 50

## 2018-03-27 NOTE — Progress Notes (Signed)
Per Dr. Earlie Server, okay to treat patient with ANC of 1.3

## 2018-03-27 NOTE — Patient Instructions (Signed)
[facility] Discharge Instructions for Patients Receiving Chemotherapy  Today you received the following chemotherapy agents: Paclitaxel (Taxol) and Carboplatin (Paraplatin)  To help prevent nausea and vomiting after your treatment, we encourage you to take your nausea medication as directed.    If you develop nausea and vomiting that is not controlled by your nausea medication, call the clinic.   BELOW ARE SYMPTOMS THAT SHOULD BE REPORTED IMMEDIATELY:  *FEVER GREATER THAN 100.5 F  *CHILLS WITH OR WITHOUT FEVER  NAUSEA AND VOMITING THAT IS NOT CONTROLLED WITH YOUR NAUSEA MEDICATION  *UNUSUAL SHORTNESS OF BREATH  *UNUSUAL BRUISING OR BLEEDING  TENDERNESS IN MOUTH AND THROAT WITH OR WITHOUT PRESENCE OF ULCERS  *URINARY PROBLEMS  *BOWEL PROBLEMS  UNUSUAL RASH Items with * indicate a potential emergency and should be followed up as soon as possible.  Feel free to call the clinic should you have any questions or concerns. The clinic phone number is [phone].  Please show the [place] at check-in to the Emergency Department and triage nurse.   Managing Low Blood Counts During Cancer Treatment Cancer treatments such as chemotherapy and radiation can sometimes cause a drop in the supply of blood cells in the body, including red blood cells, white blood cells, and platelets. These blood cells are produced in the body and are released into the blood to perform specific functions:  Red blood cells carry gases such as oxygen and carbon dioxide to and from your lungs.  White blood cells help protect you from infection.  Platelets help your body to form blood clots to prevent and control bleeding.  When cancer treatments cause a drop in blood cell counts, your body may not have enough cells to keep up its normal functions. Symptoms or problems that may result will vary depending on which type of blood cells the treatment is affecting. If your blood counts are  low, you can take steps to help manage any problems. How can low blood counts affect me? Low blood counts have various effects depending on the type of blood cells involved:  If you have a low number of red blood cells, you have a condition called anemia. This can cause symptoms such as: ? Feeling tired and weak. ? Feeling light-headed. ? Being short of breath.  If you have a low number of white blood cells, you may be at higher risk for infections.  If you have a low number of platelets, you may bleed more easily, or your body may have trouble stopping any bleeding. You may also have more bruising.  How to manage symptoms or prevent problems from a low blood count If you have a low blood count, you can take steps to manage symptoms or prevent problems that may develop. The steps to take will depend on which type of blood cell is low. Low red blood cells Take these steps to help manage the symptoms of anemia:  Go for a walk or do some light exercise each day.  Take short naps during the day.  Eat foods that contain a lot of iron and protein. These include leafy vegetables, meat and fish, beans, sweet potatoes, and dried fruit such as prunes, raisins, and apricots.  Ask for help with errands and with work that needs to be done around the house. It is important to save your energy.  Take vitamins or supplements-such as iron, vitamin Y18, or folic acid-as told by your health care provider.  Practice relaxation techniques, such as  yoga or meditation.  Low white blood cells Take these steps to help prevent infections:  Wash your hands often with warm, soapy water.  Avoid crowds of people and any person who has the flu or a fever.  Take care when cleaning yourself after using the bathroom. Tell your health care provider if you have any rectal sores or bleeding.  Avoid dental work. Check your mouth each day for sores or signs of infection.  Do not share utensils.  Avoid contact  with pet waste. Wash your hands after handling pets.  If you get a scrape or cut, clean it thoroughly right away.  Avoid fresh plants or dried flowers.  Do not swim or wade in lakes, ponds, rivers, water parks, or hot tubs.  Follow food safety guidelines. Cook meat thoroughly and wash all raw fruits and vegetables.  You may be instructed to wear a mask when around others to protect yourself.  Low platelets Take these steps to help prevent or control bleeding and bruising:  Use an electric razor for shaving instead of a blade.  Use a soft toothbrush and be careful during oral care. Talk with your cancer care team about whether you should avoid flossing. If your mouth is bleeding, rinse it with ice water.  Avoid activities that could cause injury, such as contact sports.  Talk with your health care provider about using laxatives or stool softeners to avoid constipation.  Do not use medicines such as ibuprofen, aspirin, or naproxen unless your health care provider tells you to.  Limit alcohol use.  Monitor any bleeding closely. If you start bleeding, hold pressure on the area for 5 minutes to stop the bleeding. Bleeding that does not stop is considered an emergency.  What treatments can help increase a low blood count? If needed, your health care provider may recommend treatment for a low blood count. Treatment will depend on the type of blood cell that is low and the severity of your condition. Treatment options may include:  Taking medicines to help stimulate the growth of blood cells. This is an option for treating a low red blood cell count. Your health care provider may also recommend that you take iron, folic acid, or vitamin B12 supplements.  Making dietary changes. Including more iron and protein in your diet can help stimulate the growth of red blood cells.  Adjusting your current medicines to help raise blood counts.  Making changes to your treatment plan.  Having a  blood transfusion. This may be done if your blood count is very low.  Contact a health care provider if:  You feel extremely tired and weak.  You have more bruising or bleeding.  You feel ill or you develop a cough.  You have swelling or redness.  You have mouth sores or a sore throat.  You have painful urination or you have blood in your urine or stool.  You are thinking of taking any new supplements or vitamins or making dietary changes. Get help right away if:  You are short of breath, have chest pain, or feel dizzy.  You have a fever or chills.  You have abdominal pain or diarrhea.  You have bleeding that will not stop. Summary  Cancer treatments such as chemotherapy and radiation can sometimes cause a drop in the supply of blood cells in the body, including red blood cells, white blood cells, and platelets.  If you have a low blood count, you can take steps to manage symptoms or  prevent problems that may develop.  Depending on which type of blood cell is low, you may need to take steps to prevent infection, prevent bleeding, or manage symptoms that may develop.  If needed, your health care provider may recommend treatment for a low blood count. This information is not intended to replace advice given to you by your health care provider. Make sure you discuss any questions you have with your health care provider. Document Released: 02/21/2016 Document Revised: 02/21/2016 Document Reviewed: 02/21/2016 Elsevier Interactive Patient Education  [employer].

## 2018-03-28 ENCOUNTER — Ambulatory Visit
Admission: RE | Admit: 2018-03-28 | Discharge: 2018-03-28 | Disposition: A | Discharge status: Home / Self Care | Payer: Medicare Other | Source: Ambulatory Visit | Attending: Radiation Oncology | Admitting: Radiation Oncology

## 2018-03-28 DIAGNOSIS — C3411 Malignant neoplasm of upper lobe, right bronchus or lung: Secondary | ICD-10-CM | POA: Diagnosis not present

## 2018-03-29 ENCOUNTER — Ambulatory Visit
Admission: RE | Admit: 2018-03-29 | Discharge: 2018-03-29 | Disposition: A | Discharge status: Home / Self Care | Payer: Medicare Other | Source: Ambulatory Visit | Attending: Radiation Oncology | Admitting: Radiation Oncology

## 2018-03-29 DIAGNOSIS — C3411 Malignant neoplasm of upper lobe, right bronchus or lung: Secondary | ICD-10-CM | POA: Diagnosis not present

## 2018-03-30 ENCOUNTER — Ambulatory Visit
Admission: RE | Admit: 2018-03-30 | Discharge: 2018-03-30 | Disposition: A | Discharge status: Home / Self Care | Payer: Medicare Other | Source: Ambulatory Visit | Attending: Radiation Oncology | Admitting: Radiation Oncology

## 2018-03-30 DIAGNOSIS — C3411 Malignant neoplasm of upper lobe, right bronchus or lung: Secondary | ICD-10-CM | POA: Diagnosis not present

## 2018-03-31 ENCOUNTER — Ambulatory Visit
Admission: RE | Admit: 2018-03-31 | Discharge: 2018-03-31 | Disposition: A | Discharge status: Home / Self Care | Payer: Medicare Other | Source: Ambulatory Visit | Attending: Radiation Oncology | Admitting: Radiation Oncology

## 2018-03-31 DIAGNOSIS — C3411 Malignant neoplasm of upper lobe, right bronchus or lung: Secondary | ICD-10-CM | POA: Diagnosis not present

## 2018-04-03 ENCOUNTER — Inpatient Hospital Stay: Payer: Medicare Other

## 2018-04-03 ENCOUNTER — Inpatient Hospital Stay (HOSPITAL_BASED_OUTPATIENT_CLINIC_OR_DEPARTMENT_OTHER): Payer: Medicare Other | Admitting: Internal Medicine

## 2018-04-03 ENCOUNTER — Ambulatory Visit
Admission: RE | Admit: 2018-04-03 | Discharge: 2018-04-03 | Disposition: A | Discharge status: Home / Self Care | Payer: Medicare Other | Source: Ambulatory Visit | Attending: Radiation Oncology | Admitting: Radiation Oncology

## 2018-04-03 ENCOUNTER — Encounter: Payer: Self-pay | Admitting: Internal Medicine

## 2018-04-03 VITALS — BP 124/69 | HR 78 | Temp 97.7°F | Resp 18 | Ht 68.0 in | Wt 252.1 lb

## 2018-04-03 DIAGNOSIS — Z794 Long term (current) use of insulin: Secondary | ICD-10-CM

## 2018-04-03 DIAGNOSIS — C3411 Malignant neoplasm of upper lobe, right bronchus or lung: Secondary | ICD-10-CM

## 2018-04-03 DIAGNOSIS — L03115 Cellulitis of right lower limb: Secondary | ICD-10-CM

## 2018-04-03 DIAGNOSIS — Z79899 Other long term (current) drug therapy: Secondary | ICD-10-CM

## 2018-04-03 DIAGNOSIS — R1310 Dysphagia, unspecified: Secondary | ICD-10-CM | POA: Diagnosis not present

## 2018-04-03 DIAGNOSIS — C3490 Malignant neoplasm of unspecified part of unspecified bronchus or lung: Secondary | ICD-10-CM

## 2018-04-03 DIAGNOSIS — C3491 Malignant neoplasm of unspecified part of right bronchus or lung: Secondary | ICD-10-CM

## 2018-04-03 DIAGNOSIS — C3401 Malignant neoplasm of right main bronchus: Secondary | ICD-10-CM

## 2018-04-03 DIAGNOSIS — N189 Chronic kidney disease, unspecified: Secondary | ICD-10-CM

## 2018-04-03 DIAGNOSIS — E119 Type 2 diabetes mellitus without complications: Secondary | ICD-10-CM

## 2018-04-03 DIAGNOSIS — Z5111 Encounter for antineoplastic chemotherapy: Secondary | ICD-10-CM

## 2018-04-03 DIAGNOSIS — E785 Hyperlipidemia, unspecified: Secondary | ICD-10-CM

## 2018-04-03 DIAGNOSIS — I129 Hypertensive chronic kidney disease with stage 1 through stage 4 chronic kidney disease, or unspecified chronic kidney disease: Secondary | ICD-10-CM

## 2018-04-03 LAB — CMP (CANCER CENTER ONLY)
ALT: 11 U/L (ref 0–44)
AST: 14 U/L — ABNORMAL LOW (ref 15–41)
Albumin: 3.3 g/dL — ABNORMAL LOW (ref 3.5–5.0)
Alkaline Phosphatase: 41 U/L (ref 38–126)
Anion gap: 9 (ref 5–15)
BUN: 33 mg/dL — ABNORMAL HIGH (ref 8–23)
CO2: 26 mmol/L (ref 22–32)
Calcium: 9.4 mg/dL (ref 8.9–10.3)
Chloride: 103 mmol/L (ref 98–111)
Creatinine: 1.77 mg/dL — ABNORMAL HIGH (ref 0.61–1.24)
GFR, Est AFR Am: 43 mL/min — ABNORMAL LOW (ref >60)
GFR, Estimated: 37 mL/min — ABNORMAL LOW (ref >60)
Glucose, Bld: 109 mg/dL — ABNORMAL HIGH (ref 70–99)
Potassium: 4.3 mmol/L (ref 3.5–5.1)
Sodium: 138 mmol/L (ref 135–145)
Total Bilirubin: 0.4 mg/dL (ref 0.3–1.2)
Total Protein: 6.8 g/dL (ref 6.5–8.1)

## 2018-04-03 LAB — CBC WITH DIFFERENTIAL (CANCER CENTER ONLY)
Basophils Absolute: 0 10*3/uL (ref 0.0–0.1)
Basophils Relative: 3 %
Eosinophils Absolute: 0 10*3/uL (ref 0.0–0.5)
Eosinophils Relative: 3 %
HCT: 28.3 % — ABNORMAL LOW (ref 38.4–49.9)
Hemoglobin: 9.4 g/dL — ABNORMAL LOW (ref 13.0–17.1)
Lymphocytes Relative: 20 %
Lymphs Abs: 0.2 10*3/uL — ABNORMAL LOW (ref 0.9–3.3)
MCH: 30.5 pg (ref 27.2–33.4)
MCHC: 33.2 g/dL (ref 32.0–36.0)
MCV: 91.9 fL (ref 79.3–98.0)
Monocytes Absolute: 0.1 10*3/uL (ref 0.1–0.9)
Monocytes Relative: 8 %
Neutro Abs: 0.8 10*3/uL — ABNORMAL LOW (ref 1.5–6.5)
Neutrophils Relative %: 66 %
Platelet Count: 132 10*3/uL — ABNORMAL LOW (ref 140–400)
RBC: 3.08 MIL/uL — ABNORMAL LOW (ref 4.20–5.82)
RDW: 14.7 % — ABNORMAL HIGH (ref 11.0–14.6)
WBC Count: 1.2 10*3/uL — ABNORMAL LOW (ref 4.0–10.3)

## 2018-04-03 NOTE — Progress Notes (Signed)
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  First dose 02/27/2018.  Status post 5 cycles.  INTERVAL HISTORY: Darrell [name] 72 y.o. male returns to the clinic today for follow-up visit accompanied by his wife.  The patient is feeling fine today with no specific complaints except for mild odynophagia started recently.  He denied having any chest pain, shortness of breath, cough or hemoptysis.  He denied having any fever or chills.  He has no nausea, vomiting, diarrhea or constipation.  He has no significant weight loss or night sweats.  He has no headache or visual changes.  He is here today for evaluation before starting cycle #6 of his treatment.   MEDICAL HISTORY: Past Medical History:  Diagnosis Date  . Cellulitis of right lower extremity   . Chronic kidney disease   . Diabetes mellitus without complication ([place])   . Dyslipidemia   . Hypertension     ALLERGIES:  is allergic to lisinopril.  MEDICATIONS:  Current Outpatient Medications  Medication Sig Dispense Refill  . ASTAXANTHIN PO Take 1 capsule by mouth daily.     . Blood Glucose Monitoring Suppl (ONE TOUCH ULTRA MINI) w/Device KIT 1 kit by Other route once.     . carvedilol (COREG) 6.25 MG tablet Take 6.25 mg by mouth 2 (two) times daily with a meal.     . Cinnamon Bark POWD Take 1,000 mg by mouth 2 (two) times daily.     . cloNIDine (CATAPRES) 0.1 MG tablet Take 0.1 mg by mouth 2 (two) times daily.     Marland Kitchen escitalopram (LEXAPRO) 10 MG tablet Take 10 mg by mouth daily.     .  fenofibrate 160 MG tablet Take 160 mg by mouth daily.     . Fish Oil-Cholecalciferol (OMEGA-3 FISH OIL-VITAMIN D3) 1200-1000 MG-UNIT CAPS Take by mouth.    . Glucosamine Sulfate 1000 MG TABS Take 2,000 mg by mouth daily.     Marland Kitchen glucose blood (PRECISION QID TEST) test strip by Misc.(Non-Drug; Combo Route) route.    . hydrochlorothiazide (HYDRODIURIL) 25 MG tablet Take 25 mg by mouth daily.     . Insulin Detemir (LEVEMIR FLEXTOUCH) 100 UNIT/ML Pen Inject 10 Units into the skin daily.     . Insulin Pen Needle (FIFTY50 PEN NEEDLES) 31G X 8 MM MISC USE AS DIRECTED    . Lancets MISC by Misc.(Non-Drug; Combo Route) route.    . lidocaine-prilocaine (EMLA) cream Apply 1 application topically as needed. 30 g 0  . losartan (COZAAR) 50 MG tablet Take 50 mg by mouth daily.     . magnesium oxide (MAG-OX) 400 MG tablet Take 400 mg by mouth daily.    . metFORMIN (GLUCOPHAGE) 1000 MG tablet Take 1,000 mg by mouth 2 (two) times daily with a meal.     . Multiple Vitamin (MULTI-VITAMINS) TABS Take 1 tablet by mouth daily.     . Omega-3 1000 MG CAPS Take 1,200 mg by mouth 2 (two) times daily.     . pioglitazone (ACTOS) 45 MG tablet Take 45 mg by mouth daily.     Marland Kitchen  pravastatin (PRAVACHOL) 40 MG tablet Take 40 mg by mouth daily.     . prochlorperazine (COMPAZINE) 10 MG tablet TAKE 1 TABLET BY MOUTH EVERY 6 HOURS AS NEEDED FOR NAUSEA OR VOMITING 385 tablet 0  . traMADol (ULTRAM) 50 MG tablet Take 50 mg by mouth every 6 (six) hours as needed for severe pain.      No current facility-administered medications for this visit.     SURGICAL HISTORY:  Past Surgical History:  Procedure Laterality Date  . PORTACATH PLACEMENT Left 02/21/2018   Procedure: INSERTION PORT-A-CATH;  Surgeon: Grace Isaac, MD;  [facility];  Service: Thoracic;  Laterality: Left;    REVIEW OF SYSTEMS:  A comprehensive review of systems was negative except for: Gastrointestinal: positive for odynophagia   PHYSICAL EXAMINATION: General  appearance: alert, cooperative and no distress Head: Normocephalic, without obvious abnormality, atraumatic Neck: no adenopathy, no JVD, supple, symmetrical, trachea midline and thyroid not enlarged, symmetric, no tenderness/mass/nodules Lymph nodes: Cervical, supraclavicular, and axillary nodes normal. Resp: clear to auscultation bilaterally Back: symmetric, no curvature. ROM normal. No CVA tenderness. Cardio: regular rate and rhythm, S1, S2 normal, no murmur, click, rub or gallop GI: soft, non-tender; bowel sounds normal; no masses,  no organomegaly Extremities: extremities normal, atraumatic, no cyanosis or edema  ECOG PERFORMANCE STATUS: 1 - Symptomatic but completely ambulatory  Blood pressure 124/69, pulse 78, temperature 97.7 F (36.5 C), temperature source Oral, resp. rate 18, height 5' 8"  (1.727 m), weight 252 lb 1.6 oz (114.4 kg), SpO2 99 %.  LABORATORY DATA: Lab Results  Component Value Date   WBC 1.2 (L) 04/03/2018   HGB 9.4 (L) 04/03/2018   HCT 28.3 (L) 04/03/2018   MCV 91.9 04/03/2018   PLT 132 (L) 04/03/2018      Chemistry      Component Value Date/Time   NA 136 03/27/2018 1158   K 3.9 03/27/2018 1158   CL 100 03/27/2018 1158   CO2 27 03/27/2018 1158   BUN 34 (H) 03/27/2018 1158   CREATININE 1.46 (H) 03/27/2018 1158      Component Value Date/Time   CALCIUM 9.7 03/27/2018 1158   ALKPHOS 39 03/27/2018 1158   AST 13 (L) 03/27/2018 1158   ALT 8 03/27/2018 1158   BILITOT 0.3 03/27/2018 1158       RADIOGRAPHIC STUDIES: No results found.  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 The patient is currently undergoing a course of concurrent chemoradiation with weekly carboplatin and paclitaxel status post 5 cycles.  He is tolerating the treatment well with no concerning complaints.  He is expected to complete this course of concurrent  chemoradiation next week. His absolute neutrophil count is low today.  I recommended for the patient to skip cycle #6 today. He will come back for treatment next week if his count is better. I will see him back for follow-up visit in 1 month for evaluation after repeating CT scan of the chest for restaging of his disease. For the odynophagia, I will start the patient on Carafate 1 g p.o. every 6 hours as needed. He was advised to call immediately if he has any concerning symptoms in the interval. The patient voices understanding of current disease status and treatment options and is in agreement with the current care plan.  All questions were answered. The patient knows to call the clinic with any problems,  questions or concerns. We can certainly see the patient much sooner if necessary.  I spent 10 minutes counseling the patient face to face. The total time spent in the appointment was 15 minutes.  Disclaimer: This note was dictated with voice recognition software. Similar sounding words can inadvertently be transcribed and may not be corrected upon review.

## 2018-04-03 NOTE — Progress Notes (Signed)
No treatment per MD 

## 2018-04-04 ENCOUNTER — Other Ambulatory Visit: Payer: Self-pay | Admitting: *Deleted

## 2018-04-04 ENCOUNTER — Ambulatory Visit
Admission: RE | Admit: 2018-04-04 | Discharge: 2018-04-04 | Disposition: A | Discharge status: Home / Self Care | Payer: Medicare Other | Source: Ambulatory Visit | Attending: Radiation Oncology | Admitting: Radiation Oncology

## 2018-04-04 DIAGNOSIS — C3411 Malignant neoplasm of upper lobe, right bronchus or lung: Secondary | ICD-10-CM | POA: Diagnosis not present

## 2018-04-04 MED ORDER — SUCRALFATE 1 GM/10ML PO SUSP: 1.0000 g | Freq: Four times a day (QID) | ORAL | 1 refills | Status: DC | PRN

## 2018-04-05 ENCOUNTER — Ambulatory Visit
Admission: RE | Admit: 2018-04-05 | Discharge: 2018-04-05 | Disposition: A | Discharge status: Home / Self Care | Payer: Medicare Other | Source: Ambulatory Visit | Attending: Radiation Oncology | Admitting: Radiation Oncology

## 2018-04-05 DIAGNOSIS — C3411 Malignant neoplasm of upper lobe, right bronchus or lung: Secondary | ICD-10-CM | POA: Diagnosis not present

## 2018-04-06 ENCOUNTER — Ambulatory Visit
Admission: RE | Admit: 2018-04-06 | Discharge: 2018-04-06 | Disposition: A | Discharge status: Home / Self Care | Payer: Medicare Other | Source: Ambulatory Visit | Attending: Radiation Oncology | Admitting: Radiation Oncology

## 2018-04-06 DIAGNOSIS — C3411 Malignant neoplasm of upper lobe, right bronchus or lung: Secondary | ICD-10-CM | POA: Diagnosis not present

## 2018-04-07 ENCOUNTER — Ambulatory Visit
Admission: RE | Admit: 2018-04-07 | Discharge: 2018-04-07 | Disposition: A | Discharge status: Home / Self Care | Payer: Medicare Other | Source: Ambulatory Visit | Attending: Radiation Oncology | Admitting: Radiation Oncology

## 2018-04-07 DIAGNOSIS — C3411 Malignant neoplasm of upper lobe, right bronchus or lung: Secondary | ICD-10-CM | POA: Diagnosis not present

## 2018-04-10 ENCOUNTER — Inpatient Hospital Stay: Payer: Medicare Other

## 2018-04-10 ENCOUNTER — Ambulatory Visit
Admission: RE | Admit: 2018-04-10 | Discharge: 2018-04-10 | Disposition: A | Discharge status: Home / Self Care | Payer: Medicare Other | Source: Ambulatory Visit | Attending: Radiation Oncology | Admitting: Radiation Oncology

## 2018-04-10 VITALS — BP 118/66 | HR 67 | Temp 97.7°F | Resp 18

## 2018-04-10 DIAGNOSIS — Z95828 Presence of other vascular implants and grafts: Secondary | ICD-10-CM

## 2018-04-10 DIAGNOSIS — C3411 Malignant neoplasm of upper lobe, right bronchus or lung: Secondary | ICD-10-CM | POA: Diagnosis not present

## 2018-04-10 DIAGNOSIS — C3401 Malignant neoplasm of right main bronchus: Secondary | ICD-10-CM | POA: Diagnosis not present

## 2018-04-10 DIAGNOSIS — C3491 Malignant neoplasm of unspecified part of right bronchus or lung: Secondary | ICD-10-CM

## 2018-04-10 LAB — CBC WITH DIFFERENTIAL (CANCER CENTER ONLY)
Basophils Absolute: 0 10*3/uL (ref 0.0–0.1)
Basophils Relative: 3 %
Eosinophils Absolute: 0 10*3/uL (ref 0.0–0.5)
Eosinophils Relative: 3 %
HCT: 28.6 % — ABNORMAL LOW (ref 38.4–49.9)
Hemoglobin: 9.6 g/dL — ABNORMAL LOW (ref 13.0–17.1)
Lymphocytes Relative: 20 %
Lymphs Abs: 0.3 10*3/uL — ABNORMAL LOW (ref 0.9–3.3)
MCH: 31 pg (ref 27.2–33.4)
MCHC: 33.5 g/dL (ref 32.0–36.0)
MCV: 92.7 fL (ref 79.3–98.0)
Monocytes Absolute: 0.2 10*3/uL (ref 0.1–0.9)
Monocytes Relative: 18 %
Neutro Abs: 0.8 10*3/uL — ABNORMAL LOW (ref 1.5–6.5)
Neutrophils Relative %: 56 %
Platelet Count: 190 10*3/uL (ref 140–400)
RBC: 3.09 MIL/uL — ABNORMAL LOW (ref 4.20–5.82)
RDW: 15.5 % — ABNORMAL HIGH (ref 11.0–14.6)
WBC Count: 1.3 10*3/uL — ABNORMAL LOW (ref 4.0–10.3)

## 2018-04-10 LAB — CMP (CANCER CENTER ONLY)
ALT: 13 U/L (ref 0–44)
AST: 18 U/L (ref 15–41)
Albumin: 3.4 g/dL — ABNORMAL LOW (ref 3.5–5.0)
Alkaline Phosphatase: 42 U/L (ref 38–126)
Anion gap: 8 (ref 5–15)
BUN: 35 mg/dL — ABNORMAL HIGH (ref 8–23)
CO2: 29 mmol/L (ref 22–32)
Calcium: 9.7 mg/dL (ref 8.9–10.3)
Chloride: 102 mmol/L (ref 98–111)
Creatinine: 1.68 mg/dL — ABNORMAL HIGH (ref 0.61–1.24)
GFR, Est AFR Am: 46 mL/min — ABNORMAL LOW (ref >60)
GFR, Estimated: 40 mL/min — ABNORMAL LOW (ref >60)
Glucose, Bld: 100 mg/dL — ABNORMAL HIGH (ref 70–99)
Potassium: 3.8 mmol/L (ref 3.5–5.1)
Sodium: 139 mmol/L (ref 135–145)
Total Bilirubin: 0.3 mg/dL (ref 0.3–1.2)
Total Protein: 6.8 g/dL (ref 6.5–8.1)

## 2018-04-10 MED ORDER — DIPHENHYDRAMINE HCL 50 MG/ML IJ SOLN: 50.0000 mg | Freq: Once | INTRAMUSCULAR | Status: DC

## 2018-04-10 MED ORDER — SODIUM CHLORIDE 0.9% FLUSH
10.0000 mL | INTRAVENOUS | Status: DC | PRN
  Administered 2018-04-10: 10 mL
  Filled 2018-04-10: qty 10

## 2018-04-10 MED ORDER — SODIUM CHLORIDE 0.9 % IV SOLN
Freq: Once | INTRAVENOUS | Status: AC
  Administered 2018-04-10: 11:00:00 via INTRAVENOUS
  Filled 2018-04-10: qty 250

## 2018-04-10 MED ORDER — PALONOSETRON HCL INJECTION 0.25 MG/5ML: 0.2500 mg | Freq: Once | INTRAVENOUS | Status: DC

## 2018-04-10 MED ORDER — HEPARIN SOD (PORK) LOCK FLUSH 100 UNIT/ML IV SOLN
500.0000 [IU] | Freq: Once | INTRAVENOUS | Status: AC | PRN
  Administered 2018-04-10: 500 [IU]
  Filled 2018-04-10: qty 5

## 2018-04-10 MED ORDER — SODIUM CHLORIDE 0.9 % IV SOLN: 210.0000 mg | Freq: Once | INTRAVENOUS | Status: DC

## 2018-04-10 MED ORDER — FAMOTIDINE IN NACL 20-0.9 MG/50ML-% IV SOLN: 20.0000 mg | Freq: Once | INTRAVENOUS | Status: DC

## 2018-04-10 MED ORDER — SODIUM CHLORIDE 0.9 % IV SOLN: 45.0000 mg/m2 | Freq: Once | INTRAVENOUS | Status: DC

## 2018-04-10 MED ORDER — SODIUM CHLORIDE 0.9 % IV SOLN: 20.0000 mg | Freq: Once | INTRAVENOUS | Status: DC

## 2018-04-10 NOTE — Patient Instructions (Addendum)
Implanted Port Home Guide An implanted port is a type of central line that is placed under the skin. Central lines are used to provide IV access when treatment or nutrition needs to be given through a person's veins. Implanted ports are used for long-term IV access. An implanted port may be placed because:  You need IV medicine that would be irritating to the small veins in your hands or arms.  You need long-term IV medicines, such as antibiotics.  You need IV nutrition for a long period.  You need frequent blood draws for lab tests.  You need dialysis.  Implanted ports are usually placed in the chest area, but they can also be placed in the upper arm, the abdomen, or the leg. An implanted port has two main parts:  Reservoir. The reservoir is round and will appear as a small, raised area under your skin. The reservoir is the part where a needle is inserted to give medicines or draw blood.  Catheter. The catheter is a thin, flexible tube that extends from the reservoir. The catheter is placed into a large vein. Medicine that is inserted into the reservoir goes into the catheter and then into the vein.  How will I care for my incision site? Do not get the incision site wet. Bathe or shower as directed by your health care provider. How is my port accessed? Special steps must be taken to access the port:  Before the port is accessed, a numbing cream can be placed on the skin. This helps numb the skin over the port site.  Your health care provider uses a sterile technique to access the port. ? Your health care provider must put on a mask and sterile gloves. ? The skin over your port is cleaned carefully with an antiseptic and allowed to dry. ? The port is gently pinched between sterile gloves, and a needle is inserted into the port.  Only "non-coring" port needles should be used to access the port. Once the port is accessed, a blood return should be checked. This helps ensure that the port  is in the vein and is not clogged.  If your port needs to remain accessed for a constant infusion, a clear (transparent) bandage will be placed over the needle site. The bandage and needle will need to be changed every week, or as directed by your health care provider.  Keep the bandage covering the needle clean and dry. Do not get it wet. Follow your health care provider's instructions on how to take a shower or bath while the port is accessed.  If your port does not need to stay accessed, no bandage is needed over the port.  What is flushing? Flushing helps keep the port from getting clogged. Follow your health care provider's instructions on how and when to flush the port. Ports are usually flushed with saline solution or a medicine called heparin. The need for flushing will depend on how the port is used.  If the port is used for intermittent medicines or blood draws, the port will need to be flushed: ? After medicines have been given. ? After blood has been drawn. ? As part of routine maintenance.  If a constant infusion is running, the port may not need to be flushed.  How long will my port stay implanted? The port can stay in for as long as your health care provider thinks it is needed. When it is time for the port to come out, surgery will be   done to remove it. The procedure is similar to the one performed when the port was put in. When should I seek immediate medical care? When you have an implanted port, you should seek immediate medical care if:  You notice a bad smell coming from the incision site.  You have swelling, redness, or drainage at the incision site.  You have more swelling or pain at the port site or the surrounding area.  You have a fever that is not controlled with medicine.  This information is not intended to replace advice given to you by your health care provider. Make sure you discuss any questions you have with your health care provider. Document  Released: 06/28/2005 Document Revised: 12/04/2015 Document Reviewed: 03/05/2013 Elsevier Interactive Patient Education  2017 Elsevier Inc.  

## 2018-04-10 NOTE — Progress Notes (Signed)
Dr. [name] ok to tx with ctn 1.68.

## 2018-04-10 NOTE — Progress Notes (Signed)
ANC 0.8. Dr. [name] cancelled tx today.

## 2018-04-11 ENCOUNTER — Ambulatory Visit
Admission: RE | Admit: 2018-04-11 | Discharge: 2018-04-11 | Disposition: A | Discharge status: Home / Self Care | Payer: Medicare Other | Source: Ambulatory Visit | Attending: Radiation Oncology | Admitting: Radiation Oncology

## 2018-04-11 DIAGNOSIS — Z510 Encounter for antineoplastic radiation therapy: Secondary | ICD-10-CM | POA: Insufficient documentation

## 2018-04-11 DIAGNOSIS — C3411 Malignant neoplasm of upper lobe, right bronchus or lung: Secondary | ICD-10-CM | POA: Insufficient documentation

## 2018-04-12 ENCOUNTER — Ambulatory Visit
Admission: RE | Admit: 2018-04-12 | Discharge: 2018-04-12 | Disposition: A | Discharge status: Home / Self Care | Payer: Medicare Other | Source: Ambulatory Visit | Attending: Radiation Oncology | Admitting: Radiation Oncology

## 2018-04-12 DIAGNOSIS — C3411 Malignant neoplasm of upper lobe, right bronchus or lung: Secondary | ICD-10-CM | POA: Diagnosis not present

## 2018-04-13 ENCOUNTER — Ambulatory Visit
Admission: RE | Admit: 2018-04-13 | Discharge: 2018-04-13 | Disposition: A | Discharge status: Home / Self Care | Payer: Medicare Other | Source: Ambulatory Visit | Attending: Radiation Oncology | Admitting: Radiation Oncology

## 2018-04-13 DIAGNOSIS — C3411 Malignant neoplasm of upper lobe, right bronchus or lung: Secondary | ICD-10-CM | POA: Diagnosis not present

## 2018-05-01 ENCOUNTER — Encounter (HOSPITAL_COMMUNITY): Payer: Self-pay

## 2018-05-01 ENCOUNTER — Inpatient Hospital Stay: Payer: Medicare Other | Attending: Internal Medicine

## 2018-05-01 ENCOUNTER — Ambulatory Visit (HOSPITAL_COMMUNITY)
Admission: RE | Admit: 2018-05-01 | Discharge: 2018-05-01 | Disposition: A | Discharge status: Home / Self Care | Payer: Medicare Other | Source: Ambulatory Visit | Attending: Internal Medicine | Admitting: Internal Medicine

## 2018-05-01 DIAGNOSIS — E041 Nontoxic single thyroid nodule: Secondary | ICD-10-CM | POA: Insufficient documentation

## 2018-05-01 DIAGNOSIS — C3490 Malignant neoplasm of unspecified part of unspecified bronchus or lung: Secondary | ICD-10-CM

## 2018-05-01 DIAGNOSIS — C3411 Malignant neoplasm of upper lobe, right bronchus or lung: Secondary | ICD-10-CM | POA: Insufficient documentation

## 2018-05-01 DIAGNOSIS — I700 Atherosclerosis of aorta: Secondary | ICD-10-CM | POA: Insufficient documentation

## 2018-05-01 DIAGNOSIS — Z79899 Other long term (current) drug therapy: Secondary | ICD-10-CM | POA: Diagnosis not present

## 2018-05-01 DIAGNOSIS — I129 Hypertensive chronic kidney disease with stage 1 through stage 4 chronic kidney disease, or unspecified chronic kidney disease: Secondary | ICD-10-CM | POA: Diagnosis not present

## 2018-05-01 DIAGNOSIS — E119 Type 2 diabetes mellitus without complications: Secondary | ICD-10-CM | POA: Diagnosis not present

## 2018-05-01 DIAGNOSIS — E785 Hyperlipidemia, unspecified: Secondary | ICD-10-CM | POA: Diagnosis not present

## 2018-05-01 DIAGNOSIS — R1310 Dysphagia, unspecified: Secondary | ICD-10-CM | POA: Insufficient documentation

## 2018-05-01 DIAGNOSIS — Z923 Personal history of irradiation: Secondary | ICD-10-CM | POA: Diagnosis not present

## 2018-05-01 DIAGNOSIS — Z794 Long term (current) use of insulin: Secondary | ICD-10-CM | POA: Insufficient documentation

## 2018-05-01 DIAGNOSIS — Z9221 Personal history of antineoplastic chemotherapy: Secondary | ICD-10-CM | POA: Insufficient documentation

## 2018-05-01 DIAGNOSIS — N189 Chronic kidney disease, unspecified: Secondary | ICD-10-CM | POA: Diagnosis not present

## 2018-05-01 LAB — CBC WITH DIFFERENTIAL (CANCER CENTER ONLY)
Abs Immature Granulocytes: 0.01 10*3/uL (ref 0.00–0.07)
Basophils Absolute: 0.1 10*3/uL (ref 0.0–0.1)
Basophils Relative: 2 %
Eosinophils Absolute: 0.2 10*3/uL (ref 0.0–0.5)
Eosinophils Relative: 7 %
HCT: 33.7 % — ABNORMAL LOW (ref 39.0–52.0)
Hemoglobin: 10.9 g/dL — ABNORMAL LOW (ref 13.0–17.0)
Immature Granulocytes: 0 %
Lymphocytes Relative: 10 %
Lymphs Abs: 0.3 10*3/uL — ABNORMAL LOW (ref 0.7–4.0)
MCH: 31.4 pg (ref 26.0–34.0)
MCHC: 32.3 g/dL (ref 30.0–36.0)
MCV: 97.1 fL (ref 80.0–100.0)
Monocytes Absolute: 0.5 10*3/uL (ref 0.1–1.0)
Monocytes Relative: 15 %
Neutro Abs: 2.1 10*3/uL (ref 1.7–7.7)
Neutrophils Relative %: 66 %
Platelet Count: 157 10*3/uL (ref 150–400)
RBC: 3.47 MIL/uL — ABNORMAL LOW (ref 4.22–5.81)
RDW: 19.8 % — ABNORMAL HIGH (ref 11.5–15.5)
WBC Count: 3.2 10*3/uL — ABNORMAL LOW (ref 4.0–10.5)
nRBC: 0 % (ref 0.0–0.2)

## 2018-05-01 LAB — CMP (CANCER CENTER ONLY)
ALT: 13 U/L (ref 0–44)
AST: 19 U/L (ref 15–41)
Albumin: 3.6 g/dL (ref 3.5–5.0)
Alkaline Phosphatase: 48 U/L (ref 38–126)
Anion gap: 9 (ref 5–15)
BUN: 29 mg/dL — ABNORMAL HIGH (ref 8–23)
CO2: 26 mmol/L (ref 22–32)
Calcium: 9.6 mg/dL (ref 8.9–10.3)
Chloride: 103 mmol/L (ref 98–111)
Creatinine: 1.65 mg/dL — ABNORMAL HIGH (ref 0.61–1.24)
GFR, Est AFR Am: 47 mL/min — ABNORMAL LOW (ref >60)
GFR, Estimated: 41 mL/min — ABNORMAL LOW (ref >60)
Glucose, Bld: 142 mg/dL — ABNORMAL HIGH (ref 70–99)
Potassium: 3.7 mmol/L (ref 3.5–5.1)
Sodium: 138 mmol/L (ref 135–145)
Total Bilirubin: 0.4 mg/dL (ref 0.3–1.2)
Total Protein: 6.8 g/dL (ref 6.5–8.1)

## 2018-05-01 MED ORDER — SODIUM CHLORIDE 0.9 % IJ SOLN
INTRAMUSCULAR | Status: AC
  Filled 2018-05-01: qty 50

## 2018-05-01 MED ORDER — IOHEXOL 300 MG/ML  SOLN
60.0000 mL | Freq: Once | INTRAMUSCULAR | Status: AC | PRN
  Administered 2018-05-01: 60 mL via INTRAVENOUS

## 2018-05-03 ENCOUNTER — Inpatient Hospital Stay (HOSPITAL_BASED_OUTPATIENT_CLINIC_OR_DEPARTMENT_OTHER): Payer: Medicare Other | Admitting: Oncology

## 2018-05-03 ENCOUNTER — Encounter: Payer: Self-pay | Admitting: Oncology

## 2018-05-03 VITALS — BP 157/82 | HR 83 | Temp 98.3°F | Resp 18 | Ht 68.0 in | Wt 249.2 lb

## 2018-05-03 DIAGNOSIS — N189 Chronic kidney disease, unspecified: Secondary | ICD-10-CM | POA: Diagnosis not present

## 2018-05-03 DIAGNOSIS — E041 Nontoxic single thyroid nodule: Secondary | ICD-10-CM

## 2018-05-03 DIAGNOSIS — Z9221 Personal history of antineoplastic chemotherapy: Secondary | ICD-10-CM

## 2018-05-03 DIAGNOSIS — E785 Hyperlipidemia, unspecified: Secondary | ICD-10-CM

## 2018-05-03 DIAGNOSIS — C3411 Malignant neoplasm of upper lobe, right bronchus or lung: Secondary | ICD-10-CM

## 2018-05-03 DIAGNOSIS — I129 Hypertensive chronic kidney disease with stage 1 through stage 4 chronic kidney disease, or unspecified chronic kidney disease: Secondary | ICD-10-CM | POA: Diagnosis not present

## 2018-05-03 DIAGNOSIS — R1310 Dysphagia, unspecified: Secondary | ICD-10-CM

## 2018-05-03 DIAGNOSIS — E119 Type 2 diabetes mellitus without complications: Secondary | ICD-10-CM | POA: Diagnosis not present

## 2018-05-03 DIAGNOSIS — Z794 Long term (current) use of insulin: Secondary | ICD-10-CM

## 2018-05-03 DIAGNOSIS — Z79899 Other long term (current) drug therapy: Secondary | ICD-10-CM

## 2018-05-03 DIAGNOSIS — Z923 Personal history of irradiation: Secondary | ICD-10-CM

## 2018-05-03 DIAGNOSIS — Z7189 Other specified counseling: Secondary | ICD-10-CM

## 2018-05-03 DIAGNOSIS — I700 Atherosclerosis of aorta: Secondary | ICD-10-CM

## 2018-05-03 NOTE — Progress Notes (Signed)
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  First dose 02/27/2018.  Status post 5 cycles.  CURRENT THERAPY: Under consideration for possible surgery versus beginning immunotherapy with consolidation Imfinzi 10 mg/kg every 2 weeks.  INTERVAL HISTORY: Darrell [name] 71 y.o. male returns for routine follow-up visit accompanied by his wife.  The patient is feeling fine today and has no specific complaints.  He denies fevers and chills.  Denies chest pain, shortness of breath, cough, hemoptysis.  Denies nausea, vomiting, constipation, diarrhea.  He is trying to cut back on calories so that he can lose weight.  He has lost 3 pounds since his last visit.  Denies night sweats.  The patient had a restaging CT scan of the chest and is here to discuss the results.  MEDICAL HISTORY: Past Medical History:  Diagnosis Date  . Cellulitis of right lower extremity   . Chronic kidney disease   . Diabetes mellitus without complication ([place])   . Dyslipidemia   . Hypertension     ALLERGIES:  is allergic to lisinopril.  MEDICATIONS:  Current Outpatient Medications  Medication Sig Dispense Refill  . ASTAXANTHIN PO Take 1 capsule by mouth daily.     . Blood Glucose Monitoring Suppl (ONE TOUCH ULTRA MINI) w/Device KIT 1 kit by Other route once.     . carvedilol (COREG) 6.25 MG tablet Take 6.25 mg by mouth 2 (two) times daily with a meal.     . Cinnamon Bark POWD Take 1,000 mg by mouth 2 (two) times daily.     . cloNIDine (CATAPRES) 0.1 MG tablet Take 0.1 mg by mouth 2 (two) times daily.     Marland Kitchen escitalopram (LEXAPRO) 10 MG tablet Take  10 mg by mouth daily.     . fenofibrate 160 MG tablet Take 160 mg by mouth daily.     . Fish Oil-Cholecalciferol (OMEGA-3 FISH OIL-VITAMIN D3) 1200-1000 MG-UNIT CAPS Take by mouth.    . Glucosamine Sulfate 1000 MG TABS Take 2,000 mg by mouth daily.     Marland Kitchen glucose blood (PRECISION QID TEST) test strip by Misc.(Non-Drug; Combo Route) route.    . hydrochlorothiazide (HYDRODIURIL) 25 MG tablet Take 25 mg by mouth daily.     . Insulin Detemir (LEVEMIR FLEXTOUCH) 100 UNIT/ML Pen Inject 10 Units into the skin daily.     . Insulin Pen Needle (FIFTY50 PEN NEEDLES) 31G X 8 MM MISC USE AS DIRECTED    . Lancets MISC by Misc.(Non-Drug; Combo Route) route.    . lidocaine-prilocaine (EMLA) cream Apply 1 application topically as needed. 30 g 0  . losartan (COZAAR) 50 MG tablet Take 50 mg by mouth daily.     . magnesium oxide (MAG-OX) 400 MG tablet Take 400 mg by mouth daily.    . metFORMIN (GLUCOPHAGE) 1000 MG tablet Take 1,000 mg by mouth 2 (two) times daily with a meal.     . Multiple Vitamin (MULTI-VITAMINS) TABS Take 1 tablet by mouth daily.     . Omega-3 1000 MG CAPS Take 1,200 mg by mouth 2 (two) times daily.     . pioglitazone (ACTOS) 45 MG tablet Take 45 mg by mouth daily.     Marland Kitchen  pravastatin (PRAVACHOL) 40 MG tablet Take 40 mg by mouth daily.     . prochlorperazine (COMPAZINE) 10 MG tablet TAKE 1 TABLET BY MOUTH EVERY 6 HOURS AS NEEDED FOR NAUSEA OR VOMITING 385 tablet 0  . sucralfate (CARAFATE) 1 GM/10ML suspension Take 10 mLs (1 g total) by mouth every 6 (six) hours as needed. 420 mL 1  . traMADol (ULTRAM) 50 MG tablet Take 50 mg by mouth every 6 (six) hours as needed for severe pain.      No current facility-administered medications for this visit.     SURGICAL HISTORY:  Past Surgical History:  Procedure Laterality Date  . PORTACATH PLACEMENT Left 02/21/2018   Procedure: INSERTION PORT-A-CATH;  Surgeon: Grace Isaac, MD;  Location: [facility] OR;  Service: Thoracic;  Laterality: Left;    REVIEW  OF SYSTEMS:   Review of Systems  Constitutional: Negative for appetite change, chills, fatigue, fever and unexpected weight change.  HENT:   Negative for mouth sores, nosebleeds, sore throat and trouble swallowing.   Eyes: Negative for eye problems and icterus.  Respiratory: Negative for cough, hemoptysis, shortness of breath and wheezing.   Cardiovascular: Negative for chest pain and leg swelling.  Gastrointestinal: Negative for abdominal pain, constipation, diarrhea, nausea and vomiting.  Genitourinary: Negative for bladder incontinence, difficulty urinating, dysuria, frequency and hematuria.   Musculoskeletal: Negative for back pain, gait problem, neck pain and neck stiffness.  Skin: Negative for itching and rash.  Neurological: Negative for dizziness, extremity weakness, gait problem, headaches, light-headedness and seizures.  Hematological: Negative for adenopathy. Does not bruise/bleed easily.  Psychiatric/Behavioral: Negative for confusion, depression and sleep disturbance. The patient is not nervous/anxious.     PHYSICAL EXAMINATION:  Blood pressure (!) 157/82, pulse 83, temperature 98.3 F (36.8 C), temperature source Oral, resp. rate 18, height '5\' 8"'$  (1.727 m), weight 249 lb 3.2 oz (113 kg), SpO2 100 %.  ECOG PERFORMANCE STATUS: 1 - Symptomatic but completely ambulatory  Physical Exam  Constitutional: Oriented to person, place, and time and well-developed, well-nourished, and in no distress. No distress.  HENT:  Head: Normocephalic and atraumatic.  Mouth/Throat: Oropharynx is clear and moist. No oropharyngeal exudate.  Eyes: Conjunctivae are normal. Right eye exhibits no discharge. Left eye exhibits no discharge. No scleral icterus.  Neck: Normal range of motion. Neck supple.  Cardiovascular: Normal rate, regular rhythm, normal heart sounds and intact distal pulses.   Pulmonary/Chest: Effort normal and breath sounds normal. No respiratory distress. No wheezes. No rales.   Abdominal: Soft. Bowel sounds are normal. Exhibits no distension and no mass. There is no tenderness.  Musculoskeletal: Normal range of motion. Exhibits no edema.  Lymphadenopathy:    No cervical adenopathy.  Neurological: Alert and oriented to person, place, and time. Exhibits normal muscle tone. Gait normal. Coordination normal.  Skin: Skin is warm and dry. No rash noted. Not diaphoretic. No erythema. No pallor.  Psychiatric: Mood, memory and judgment normal.  Vitals reviewed.  LABORATORY DATA: Lab Results  Component Value Date   WBC 3.2 (L) 05/01/2018   HGB 10.9 (L) 05/01/2018   HCT 33.7 (L) 05/01/2018   MCV 97.1 05/01/2018   PLT 157 05/01/2018      Chemistry      Component Value Date/Time   NA 138 05/01/2018 1115   K 3.7 05/01/2018 1115   CL 103 05/01/2018 1115   CO2 26 05/01/2018 1115   BUN 29 (H) 05/01/2018 1115   CREATININE 1.65 (H) 05/01/2018 1115      Component  Value Date/Time   CALCIUM 9.6 05/01/2018 1115   ALKPHOS 48 05/01/2018 1115   AST 19 05/01/2018 1115   ALT 13 05/01/2018 1115   BILITOT 0.4 05/01/2018 1115       RADIOGRAPHIC STUDIES:  Ct Chest W Contrast  Result Date: 05/01/2018 CLINICAL DATA:  Followup non-small cell lung cancer. EXAM: CT CHEST WITH CONTRAST TECHNIQUE: Multidetector CT imaging of the chest was performed during intravenous contrast administration. CONTRAST:  69m OMNIPAQUE IOHEXOL 300 MG/ML  SOLN COMPARISON:  CT chest from 01/31/2018 FINDINGS: Cardiovascular: Normal heart size. No pericardial effusion identified. Aortic atherosclerosis. Multi vessel coronary artery atherosclerotic calcifications. Mediastinum/Nodes: 1.5 cm low-density nodule arises from the inferior pole of right lobe of thyroid gland. The trachea appears patent and is midline. Normal appearance of the esophagus. No mediastinal adenopathy. Lungs/Pleura: The right upper lobe perihilar lung mass measures 4.3 x 1.9 by 2.9 cm, image 98/5. On the previous examination this  measured 6.5 x 4.0 by 4.5 cm. Persistent subsegmental atelectasis within the medial right upper lobe. There has been resolution of previous in the luminal component involving the distal right bronchus. Upper Abdomen: Unchanged hypodensity along the anterior dome of liver which is too small to characterize measuring 8 mm, image 97/2. No acute abnormality within the visualized portions of the upper abdomen. Musculoskeletal: Spondylosis within the thoracic spine. No aggressive lytic or sclerotic bone lesions. IMPRESSION: 1. Interval decrease in size of right hilar lung mass. 2. No new or progressive findings identified. 3.  Aortic Atherosclerosis (ICD10-I70.0). 4. Coronary artery atherosclerotic calcifications. Electronically Signed   By: TKerby MoorsM.D.   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 The patient is currently undergoing a course of concurrent chemoradiation with weekly carboplatin and paclitaxel status post 5 cycles.  He tolerated the treatment well overall with the exception of mild odynophagia and neutropenia.  He had a restaging CT scan of the chest and is here to discuss the results.  The patient was seen with Dr. MJulien Nordmann  CT scan results and images are reviewed with the patient is wife.  We discussed that he has had a good response to his prior concurrent chemoradiation.  We discussed that it may be difficult to perform surgery since the lesion is centrally located.  We will go ahead and refer him to Dr. GServando Snarefor discussion regarding surgical options.  We also discussed with the patient is wife that if surgery is not an option, we discussed with the patient options for management of his condition including close observation and monitoring versus consolidation treatment with immunotherapy with Imfinzi  (Durvalumab) 10 MG/KG every 2 weeks for a total of one year unless the patient developed toxicity or disease progression. Adverse effect of this treatment including but not limited to immunotherapy mediated skin rash, diarrhea, or inflammation of the lung, kidney, liver, thyroid or other endocrine dysfunction including type 1 diabetes mellitus.  We will have a more detailed discussion regarding immunotherapy once he has had a surgical consult.  The patient [place] once he has seen Dr. GServando Snare  He was advised to call immediately if he has any concerning symptoms in the interval. The patient voices understanding of current disease status and treatment options and is in agreement with the current care plan.  All questions were  answered. The patient knows to call the clinic with any problems, questions or concerns. We can certainly see the patient much sooner if necessary.   Orders Placed This Encounter  Procedures  . Ambulatory referral to Cardiothoracic Surgery    Referral Priority:   Routine    Referral Type:   Surgical    Referral Reason:   Specialty Services Required    Referred to Provider:   Grace Isaac, MD    Requested Specialty:   Cardiothoracic Surgery    Number of Visits Requested:   1     Mikey Bussing, [place], AGPCNP-BC, AOCNP 05/03/18   ADDENDUM: Hematology/Oncology Attending: I had a face-to-face encounter with the patient today.  I recommended his care plan.  This is a very pleasant 73 years old white male with a stage IIIa non-small cell lung cancer, squamous cell carcinoma status post a course of concurrent chemoradiation with weekly carboplatin and paclitaxel.  The patient tolerated his treatment well with no concerning complaints except for mild fatigue. He had repeat CT scan of the chest performed recently.  I personally and independently reviewed the scan images and discussed the results and showed the images to the patient and  his wife today.  His a scan showed market improvement in his disease but unfortunately he continues to have centrally located lesion. He is still interested in discussing the surgical option with Dr. Servando Snare before consideration of consolidation treatment. I will refer the patient to Dr. Servando Snare for evaluation of this option but I have a concern that he may require right pneumonectomy as a surgical option but will wait for the final evaluation of Dr. Servando Snare. If the patient is not a good surgical candidate, we would consider him for consolidation treatment with immunotherapy with Imfinzi (Durvalumab) 10 mg/KG every 2 weeks for a total of 1 year unless the patient has disease progression or unacceptable toxicity. We will arrange for the patient a follow-up appointment after his evaluation by Dr. Servando Snare for more detailed discussion of this option. He was advised to call immediately if he has any concerning symptoms in the interval.  Disclaimer: This note was dictated with voice recognition software. Similar sounding words can inadvertently be transcribed and may be missed upon review. Eilleen Kempf, MD 05/03/18

## 2018-05-03 NOTE — Patient Instructions (Signed)
Durvalumab injection  What is this medicine?  DURVALUMAB (dur VAL ue mab) is a monoclonal antibody. It is used to treat urothelial cancer.  This medicine may be used for other purposes; ask your health care provider or pharmacist if you have questions.  COMMON BRAND NAME(S): IMFINZI  What should I tell my health care provider before I take this medicine?  They need to know if you have any of these conditions:  -diabetes  -immune system problems  -infection  -inflammatory bowel disease  -kidney disease  -liver disease  -lung or breathing disease  -lupus  -organ transplant  -stomach or intestine problems  -thyroid disease  -an unusual or allergic reaction to durvalumab, other medicines, foods, dyes, or preservatives  -pregnant or trying to get pregnant  -breast-feeding  How should I use this medicine?  This medicine is for infusion into a vein. It is given by a health care professional in a hospital or clinic setting.  A special MedGuide will be given to you before each treatment. Be sure to read this information carefully each time.  Talk to your pediatrician regarding the use of this medicine in children. Special care may be needed.  Overdosage: If you think you have taken too much of this medicine contact a poison control center or emergency room at once.  NOTE: This medicine is only for you. Do not share this medicine with others.  What if I miss a dose?  It is important not to miss your dose. Call your doctor or health care professional if you are unable to keep an appointment.  What may interact with this medicine?  Interactions have not been studied.  This list may not describe all possible interactions. Give your health care provider a list of all the medicines, herbs, non-prescription drugs, or dietary supplements you use. Also tell them if you smoke, drink alcohol, or use illegal drugs. Some items may interact with your medicine.  What should I watch for while using this medicine?  This drug may make you  feel generally unwell. Continue your course of treatment even though you feel ill unless your doctor tells you to stop.  You may need blood work done while you are taking this medicine.  Do not become pregnant while taking this medicine or for 3 months after stopping it. Women should inform their doctor if they wish to become pregnant or think they might be pregnant. There is a potential for serious side effects to an unborn child. Talk to your health care professional or pharmacist for more information. Do not breast-feed an infant while taking this medicine or for 3 months after stopping it.  What side effects may I notice from receiving this medicine?  Side effects that you should report to your doctor or health care professional as soon as possible:  -allergic reactions like skin rash, itching or hives, swelling of the face, lips, or tongue  -black, tarry stools  -bloody or watery diarrhea  -breathing problems  -change in emotions or moods  -change in sex drive  -changes in vision  -chest pain or chest tightness  -chills  -confusion  -cough  -facial flushing  -fever  -headache  -signs and symptoms of high blood sugar such as dizziness; dry mouth; dry skin; fruity breath; nausea; stomach pain; increased hunger or thirst; increased urination  -signs and symptoms of liver injury like dark yellow or brown urine; general ill feeling or flu-like symptoms; light-colored stools; loss of appetite; nausea; right upper belly pain;   unusually weak or tired; yellowing of the eyes or skin  -stomach pain  -trouble passing urine or change in the amount of urine  -weight gain or weight loss  Side effects that usually do not require medical attention (report these to your doctor or health care professional if they continue or are bothersome):  -bone pain  -constipation  -loss of appetite  -muscle pain  -nausea  -swelling of the ankles, feet, hands  -tiredness  This list may not describe all possible side effects. Call your doctor  for medical advice about side effects. You may report side effects to FDA at 1-800-FDA-1088.  Where should I keep my medicine?  This drug is given in a hospital or clinic and will not be stored at home.  NOTE: This sheet is a summary. It may not cover all possible information. If you have questions about this medicine, talk to your doctor, pharmacist, or health care provider.  © 2018 Elsevier/Gold Standard (2016-01-30 15:50:36)

## 2018-05-03 NOTE — Assessment & Plan Note (Addendum)
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 The patient is currently undergoing a course of concurrent chemoradiation with weekly carboplatin and paclitaxel status post 5 cycles.  He tolerated the treatment well overall with the exception of mild odynophagia and neutropenia.  He had a restaging CT scan of the chest and is here to discuss the results.  The patient was seen with Dr. [name].  CT scan results and images are reviewed with the patient is wife.  We discussed that he has had a good response to his prior concurrent chemoradiation.  We discussed that it may be difficult to perform surgery since the lesion is centrally located.  We will go ahead and refer him to Dr. Servando Snare for discussion regarding surgical options.  We also discussed with the patient is wife that if surgery is not an option, we discussed with the patient options for management of his condition including close observation and monitoring versus consolidation treatment with immunotherapy with Imfinzi (Durvalumab) 10 MG/KG every 2 weeks for a total of one year unless the patient developed toxicity or disease progression. Adverse effect of this treatment including but not limited to immunotherapy mediated skin rash, diarrhea, or inflammation of the lung, kidney, liver, thyroid or other endocrine dysfunction including type 1 diabetes mellitus.  We will have a more detailed discussion regarding immunotherapy once he has had a surgical consult.  The patient will contact us for a follow-up appointment once he has seen Dr. Servando Snare.  He was advised to call immediately if he has any concerning symptoms in the interval. The patient voices understanding of current disease status and treatment options and is in agreement with the current care plan.  All questions were answered. The patient knows to call the clinic with any problems, questions  or concerns. We can certainly see the patient much sooner if necessary.

## 2018-05-08 ENCOUNTER — Ambulatory Visit: Payer: Medicare Other | Admitting: Cardiothoracic Surgery

## 2018-05-08 ENCOUNTER — Ambulatory Visit: Payer: Medicare Other | Admitting: Oncology

## 2018-05-08 VITALS — BP 163/97 | HR 78 | Resp 20 | Ht 68.0 in | Wt 249.0 lb

## 2018-05-08 DIAGNOSIS — R918 Other nonspecific abnormal finding of lung field: Secondary | ICD-10-CM | POA: Diagnosis not present

## 2018-05-08 DIAGNOSIS — C3490 Malignant neoplasm of unspecified part of unspecified bronchus or lung: Secondary | ICD-10-CM | POA: Diagnosis not present

## 2018-05-08 NOTE — Progress Notes (Signed)
[address]       [place] [address]             [phone]                    Helder E Jenson [facility] Medical Record #[id number] Date of Birth: Dec 07, 1945  Referring: Maryanna Shape, NP Primary Care: [name], MD Primary Cardiologist: No primary care provider on file.  Chief Complaint:    Chief Complaint  Patient presents with  . Lung Cancer    Surgical eval,Chest CT 05/01/18, stage IIIa non-small cell lung cancer, completed chemoradiation    History of Present Illness:    Darrell [name] 72 y.o. 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  Patient presented with new onset of hemoptysis while working in [place].  While there he underwent bronchoscopy, repeat bronchoscopy and coring of obstructing mass in his right mainstem bronchus by Dr. Karalee Height in [place].    On initial presentation reviewing his CT scan and PET scan with evidence of mediastinal involvement stage as clinical IIIA.  He was started on radiation and chemotherapy which he tolerated well, is known renal insufficiency stayed stable.   The patient was seen by Dr. Lyman Speller and on January 31, 2018 he underwent bronchoscopy with biopsy of the right hilar mass. The final pathology [id number]) from [place] [facility], [facility] showed nonkeratinizing squamous cell carcinoma, moderately differentiated. The immunohistochemical stains showed the cells were positive for CK 5 and p63 but negative for P 40, TTF-1 and CK7.  DIAGNOSIS: Stage IIb/IIIa (T2b, N0/N2,M0),non-small cell lung cancer, squamous cell carcinoma diagnosed in July 2019 and presented with large right hilar mass with questionable mediastinal invasion CURRENT THERAPY: Concurrent chemoradiation with weekly carboplatin for  AUC of 2 and paclitaxel 45 mg/M2.  First dose 02/27/2018.  Status post 5 cycles.   Current Activity/ Functional Status:  Patient is independent with mobility/ambulation, transfers, ADL's, IADL's.   Zubrod Score: At the time of surgery this patient's most appropriate activity status/level should be described as: []     0    Normal activity, no symptoms [x]     1    Restricted in physical strenuous activity but ambulatory, able to do out light work []     2    Ambulatory and capable of self care, unable to do work activities, up and about               >50 % of waking hours                              []     3    Only limited self care, in bed greater than 50% of waking hours []     4    Completely disabled, no self care, confined to bed or chair []     5    Moribund   Past Medical History:  Diagnosis Date  . Cellulitis of right lower extremity   . Chronic kidney disease   . Diabetes mellitus without complication ([place])   . Dyslipidemia   . Hypertension     Past Surgical History:  Procedure Laterality Date  . PORTACATH PLACEMENT Left 02/21/2018   Procedure: INSERTION PORT-A-CATH;  Surgeon: Lanelle Bal  B, MD;  Location: [facility];  Service: Thoracic;  Laterality: Left;    Family History  Problem Relation Age of Onset  . Breast cancer Mother   . CAD Father      Social History   Tobacco Use  Smoking Status Former Smoker  . Packs/day: 1.50  . Years: 20.00  . Pack years: 30.00  . Types: Cigarettes  . Last attempt to quit: 1988  . Years since quitting: 31.8  Smokeless Tobacco Never Used    Social History   Substance and Sexual Activity  Alcohol Use Yes   Comment: occasionally     Allergies  Allergen Reactions  . Lisinopril Cough         Current Outpatient Medications  Medication Sig Dispense Refill  . ASTAXANTHIN PO Take 1 capsule by mouth daily.     . Blood Glucose Monitoring Suppl (ONE TOUCH ULTRA MINI) w/Device KIT 1 kit by Other route once.     . carvedilol  (COREG) 6.25 MG tablet Take 6.25 mg by mouth 2 (two) times daily with a meal.     . Cinnamon Bark POWD Take 1,000 mg by mouth 2 (two) times daily.     . cloNIDine (CATAPRES) 0.1 MG tablet Take 0.1 mg by mouth 2 (two) times daily.     Marland Kitchen escitalopram (LEXAPRO) 10 MG tablet Take 10 mg by mouth daily.     . fenofibrate 160 MG tablet Take 160 mg by mouth daily.     . Fish Oil-Cholecalciferol (OMEGA-3 FISH OIL-VITAMIN D3) 1200-1000 MG-UNIT CAPS Take by mouth.    . Glucosamine Sulfate 1000 MG TABS Take 2,000 mg by mouth daily.     Marland Kitchen glucose blood (PRECISION QID TEST) test strip by Misc.(Non-Drug; Combo Route) route.    . hydrochlorothiazide (HYDRODIURIL) 25 MG tablet Take 25 mg by mouth daily.     . Insulin Detemir (LEVEMIR FLEXTOUCH) 100 UNIT/ML Pen Inject 10 Units into the skin daily.     . Insulin Pen Needle (FIFTY50 PEN NEEDLES) 31G X 8 MM MISC USE AS DIRECTED    . Lancets MISC by Misc.(Non-Drug; Combo Route) route.    . lidocaine-prilocaine (EMLA) cream Apply 1 application topically as needed. 30 g 0  . losartan (COZAAR) 50 MG tablet Take 50 mg by mouth daily.     . magnesium oxide (MAG-OX) 400 MG tablet Take 400 mg by mouth daily.    . metFORMIN (GLUCOPHAGE) 1000 MG tablet Take 1,000 mg by mouth 2 (two) times daily with a meal.     . Multiple Vitamin (MULTI-VITAMINS) TABS Take 1 tablet by mouth daily.     . Omega-3 1000 MG CAPS Take 1,200 mg by mouth 2 (two) times daily.     . pioglitazone (ACTOS) 45 MG tablet Take 45 mg by mouth daily.     . pravastatin (PRAVACHOL) 40 MG tablet Take 40 mg by mouth daily.     . prochlorperazine (COMPAZINE) 10 MG tablet TAKE 1 TABLET BY MOUTH EVERY 6 HOURS AS NEEDED FOR NAUSEA OR VOMITING 385 tablet 0  . sucralfate (CARAFATE) 1 GM/10ML suspension Take 10 mLs (1 g total) by mouth every 6 (six) hours as needed. 420 mL 1  . traMADol (ULTRAM) 50 MG tablet Take 50 mg by mouth every 6 (six) hours as needed for severe pain.      No current facility-administered  medications for this visit.     Pertinent items are noted in HPI.   Review of Systems:  Cardiac Review of Systems: [Y] = yes  or   [ N ] = no   Chest Pain [  n  ]  Resting SOB [ n  ] Exertional SOB  Blue.Reese  ]  Orthopnea Florencio.Farrier  ]   Pedal Edema Florencio.Farrier   ]    Palpitations Florencio.Farrier  ] Syncope  [  n]   Presyncope [ n  ]   General Review of Systems: [Y] = yes [  ]=no Constitional: recent weight change [  ];  Wt loss over the last 3 months [   ] anorexia [  ]; fatigue [  ]; nausea [  ]; night sweats [  ]; fever [  ]; or chills [  ];           Eye : blurred vision [  ]; diplopia [   ]; vision changes [  ];  Amaurosis fugax[  ]; Resp: cough [  ];  wheezing[  ];  hemoptysis[  ]; shortness of breath[  ]; paroxysmal nocturnal dyspnea[  ]; dyspnea on exertion[  ]; or orthopnea[  ];  GI:  gallstones[  ], vomiting[  ];  dysphagia[  ]; melena[  ];  hematochezia [  ]; heartburn[  ];   Hx of  Colonoscopy[  ]; GU: kidney stones [  ]; hematuria[  ];   dysuria [  ];  nocturia[  ];  history of     obstruction [  ]; urinary frequency [  ]             Skin: rash, swelling[  ];, hair loss[  ];  peripheral edema[  ];  or itching[  ]; Musculosketetal: myalgias[  ];  joint swelling[  ];  joint erythema[  ];  joint pain[  ];  back pain[  ];  Heme/Lymph: bruising[  ];  bleeding[  ];  anemia[  ];  Neuro: TIA[  ];  headaches[  ];  stroke[  ];  vertigo[  ];  seizures[  ];   paresthesias[  ];  difficulty walking[  ];  Psych:depression[  ]; anxiety[  ];  Endocrine: diabetes[  ];  thyroid dysfunction[  ];  Immunizations: Flu up to date [  ]; Pneumococcal up to date [  ];  Other:     PHYSICAL EXAMINATION: BP (!) 163/97   Pulse 78   Resp 20   Ht 5' 8"  (1.727 m)   Wt 249 lb (112.9 kg)   SpO2 97% Comment: RA  BMI 37.86 kg/m  General appearance: alert, cooperative and no distress Head: Normocephalic, without obvious abnormality, atraumatic Neck: no adenopathy, no carotid bruit, no JVD, supple, symmetrical, trachea midline and  thyroid not enlarged, symmetric, no tenderness/mass/nodules Lymph nodes: Cervical, supraclavicular, and axillary nodes normal. Resp: clear to auscultation bilaterally Cardio: regular rate and rhythm, S1, S2 normal, no murmur, click, rub or gallop GI: soft, non-tender; bowel sounds normal; no masses,  no organomegaly Extremities: extremities normal, atraumatic, no cyanosis or edema and Homans sign is negative, no sign of DVT Neurologic: Grossly normal  Diagnostic Studies & Laboratory data:     Recent Radiology Findings:   Ct Chest W Contrast  Result Date: 05/01/2018 CLINICAL DATA:  Followup non-small cell lung cancer. EXAM: CT CHEST WITH CONTRAST TECHNIQUE: Multidetector CT imaging of the chest was performed during intravenous contrast administration. CONTRAST:  54m OMNIPAQUE IOHEXOL 300 MG/ML  SOLN COMPARISON:  CT chest from 01/31/2018 FINDINGS: Cardiovascular: Normal heart size. No pericardial effusion identified. Aortic atherosclerosis.  Multi vessel coronary artery atherosclerotic calcifications. Mediastinum/Nodes: 1.5 cm low-density nodule arises from the inferior pole of right lobe of thyroid gland. The trachea appears patent and is midline. Normal appearance of the esophagus. No mediastinal adenopathy. Lungs/Pleura: The right upper lobe perihilar lung mass measures 4.3 x 1.9 by 2.9 cm, image 98/5. On the previous examination this measured 6.5 x 4.0 by 4.5 cm. Persistent subsegmental atelectasis within the medial right upper lobe. There has been resolution of previous in the luminal component involving the distal right bronchus. Upper Abdomen: Unchanged hypodensity along the anterior dome of liver which is too small to characterize measuring 8 mm, image 97/2. No acute abnormality within the visualized portions of the upper abdomen. Musculoskeletal: Spondylosis within the thoracic spine. No aggressive lytic or sclerotic bone lesions. IMPRESSION: 1. Interval decrease in size of right hilar lung  mass. 2. No new or progressive findings identified. 3.  Aortic Atherosclerosis (ICD10-I70.0). 4. Coronary artery atherosclerotic calcifications. Electronically Signed   By: Kerby Moors M.D.   On: 05/01/2018 14:35   CLINICAL DATA:  Initial treatment strategy for lung cancer.  EXAM: NUCLEAR MEDICINE PET SKULL BASE TO THIGH  TECHNIQUE: 14.5 mCi F-18 FDG was injected intravenously. Full-ring PET imaging was performed from the skull base to thigh after the radiotracer. CT data was obtained and used for attenuation correction and anatomic localization.  Fasting blood glucose: 121 mg/dl  COMPARISON:  Chest CT 01/31/2017  FINDINGS: Mediastinal blood pool activity: SUV max 4.19  NECK: No hypermetabolic lymph nodes in the neck.  Incidental CT findings: none  CHEST: Right hilar lung mass measures 5.6 cm and has an SUV max of 15.45. This completely occludes the right upper lobe bronchus resulting in mild postobstructive pneumonitis.  Right paratracheal lymph node measures 1.1 cm within SUV max of 3.48. No abnormal uptake within the subcarinal, contralateral mediastinum or hilum. No hypermetabolic supraclavicular or axillary lymph nodes.  No pleural effusions.  Incidental CT findings: Aortic atherosclerosis. Calcifications identified within the LAD and RCA and left circumflex coronary arteries.  ABDOMEN/PELVIS: No abnormal hypermetabolic activity within the liver, pancreas, adrenal glands, or spleen. No hypermetabolic lymph nodes in the abdomen or pelvis.  Incidental CT findings: Aortic atherosclerosis.  No aneurysm.  SKELETON: No focal hypermetabolic activity to suggest skeletal metastasis.  Incidental CT findings: Lumbar spondylosis noted including bilateral L5 pars defects with anterolisthesis of L5 on S1.  IMPRESSION: 1. There is intense FDG uptake associated with the right hilar lung mass which obstructs the right upper lobe bronchus. This results  in mild postobstructive pneumonitis in the right upper lobe. 2. Small right paratracheal lymph node demonstrates mild FDG uptake with an SUV max of 3.48, equivocal for metastatic disease. No evidence for distant metastatic disease. 3.  Aortic Atherosclerosis (ICD10-I70.0). 4. Multi vessel coronary artery atherosclerotic calcifications.   Electronically Signed   By: Kerby Moors M.D.   On: 02/16/2018 14:01   CLINICAL DATA:  Non-small cell lung cancer. Malignant neoplasm of unspecified part of unspecified bronchus or lung.  EXAM: MRI HEAD WITHOUT AND WITH CONTRAST  TECHNIQUE: Multiplanar, multiecho pulse sequences of the brain and surrounding structures were obtained without and with intravenous contrast.  CONTRAST:  90m MULTIHANCE GADOBENATE DIMEGLUMINE 529 MG/ML IV SOLN  COMPARISON:  None.  FINDINGS: Brain: Atrophy and white matter changes are moderately advanced for age.  No acute infarct, hemorrhage, or mass lesion is present.  Postcontrast images demonstrate no pathologic enhancement.  The internal auditory canals are within normal limits bilaterally. The brainstem  and cerebellum are normal.  Vascular: Flow is present in the major intracranial arteries.  Skull and upper cervical spine: The skull base is within normal limits. The craniocervical junction is normal. The upper cervical spine is unremarkable.  Sinuses/Orbits: Mild mucosal thickening is present in the anterior paranasal sinuses. No fluid levels are present. There is some mucosal thickening in the sphenoid sinus is well. Minimal fluid is present in the inferior right mastoid air cells. No obstructing nasopharyngeal lesion is present.  IMPRESSION: 1. No evidence for metastatic disease to the brain. 2. Atrophy and white matter changes are moderately advanced for age. This is nonspecific, likely reflects the sequela of chronic microvascular ischemia. 3. Mild diffuse paranasal sinus  disease. 4. Minimal fluid in the right mastoid air cells. This is likely incidental. No obstructing nasopharyngeal lesion is present.   Electronically Signed   By: San Morelle M.D.   On: 02/16/2018 08:37    I have independently reviewed the above radiology studies  and reviewed the findings with the patient.   [facility] SURGICAL PATHOLOGY REPORT --------------------------------------------------------------------- PATIENT:  CALLAHAN, WILD         ACCESSION NO: [id number] MRN/SEX:  [id number] Cleda Clarks:  [id number] DOB:    46 DEC 1947          COLLECTED:   01 Feb 2018 LOCATION:  SURGERY/RECOVERY        RECEIVED:   01 Feb 2018 PHYSICIAN: FELDMAN, GREGORY        SIGNED OUT:  02 Feb 2018 ---------------------------------------------------------------------   DIAGNOSIS A. LUNG, RIGHT UPPER LOBE AND RIGHT MAIN BRONCHUS, BIOPSY: - SQUAMOUS CARCINOMA.  B. LUNG, RIGHT UPPER LOBE AND RIGHT MAIN BRONCHUS, BIOPSY: - BLOOD ADMIXED WITH RESPIRATORY MUCOSA AND SCANT ATYPICAL CELLS.  Electronically Signed 02/02/2018 2:13 PM Chancy Milroy MD  --------------------------------------------------------------------- Clinical history Pre-operative diagnosis: Suspected lung cancer Post-operative diagnosis: Same History of malignancy: No history of malignancy  Specimen(s) A. Lung, right, upper lobe, and right main bronchus lung tumor, biopsy  B. Lung, right, upper lobe, and right main bronchus lung tumor, biopsy   Gross description A. Received in a formalin-filled container labeled with the patient's  name, DOB and "right upper lobe and right main bronchus lung tumor"  are two tan-red tissues with adherent clotted blood, overall  aggregating to 2 x 1.5 x 0.3 cm. The entire specimen is submitted in  one cassette labeled A1.  Tissue handling and fixation: Cold ischemic  time is less than or equal to 1 hour Time in 10% phosphate buffered neutral formalin is between 6 and 72  hours  B. Received in a formalin-filled container labeled with the patient's  name, DOB and "right upper lobe and right main bronchus lung tumor" is a 2 x 1.5 x 0.5 cm tan-red, clotted, and friable tissue. The  specimen is sectioned and entirely submitted in one cassette labeled  B1.  Tissue handling and fixation: Cold ischemic time is less than or equal to 1 hour Time in 10% phosphate buffered neutral formalin is between 6 and 72  hours  Some tests may have been performed that were developed with  performance characteristics determined by [facility]. These tests have not been cleared or approved by the [place]  Food and Drug Administration. The FDA does not require these tests to  go through premarket FDA review. The testing is used for clinical  purposes and should not be regarded as investigational  or for  research. This laboratory is certified under the Clinical Laboratory  Improvement Amendments (CLIA) as qualified to perform high complexity  clinical laboratory testing. Reference laboratories performing these  tests are approved by [facility] and are  certified as CLIA laboratories, with test development in their  laboratory.  Point of Service [address], [place], [place] [address] Gardenia Phlegm, MD - 02/01/2018 12:50 AM EDT Rigid Bronchoscopy with Extirpation of the RUL/R main bronchus tumor Blood Loss 40 cc Anaestesia -Propofol general  Indication Bleeding tumor occluding RUL and extending into Right Main bronchus  Patient was intubated with size 16 Dumon rigid bronchoscope-tracheal tube without difficulty Airways were cleared of blood and blood clots with FOB  Tumor-right main bronchus was extirpated using Dumon bronchial rigid tube size 14 in steps  Meticolous lavage, no bleeding at the time of the procedure  conclusion  Plan Would need RT to the RUL soon  Patient tolerated procedure well No apparent complications with exception of the transitory bleeding At the time of this dictation patient was extubated and under care of anaestesia team   Recent Lab Findings: Lab Results  Component Value Date   WBC 3.2 (L) 05/01/2018   HGB 10.9 (L) 05/01/2018   HCT 33.7 (L) 05/01/2018   PLT 157 05/01/2018   GLUCOSE 142 (H) 05/01/2018   CHOL 184 06/06/2006   TRIG 197 (H) 06/06/2006   HDL 37.9 (L) 06/06/2006   LDLCALC 107 (H) 06/06/2006   ALT 13 05/01/2018   AST 19 05/01/2018   NA 138 05/01/2018   K 3.7 05/01/2018   CL 103 05/01/2018   CREATININE 1.65 (H) 05/01/2018   BUN 29 (H) 05/01/2018   CO2 26 05/01/2018   INR 1.12 02/21/2018   HGBA1C 7.0 (H) 06/06/2006   Chronic Kidney Disease   Stage I     GFR >90  Stage II    GFR 60-89  Stage IIIA GFR 45-59  Stage IIIB GFR 30-44  Stage IV   GFR 15-29  Stage V    GFR  <15  Lab Results  Component Value Date   CREATININE 1.65 (H) 05/01/2018   Estimated Creatinine Clearance: 51.5 mL/min (A) (by C-G formula based on SCr of 1.65 mg/dL (H)).  PFT's: FEV1 2.37  75% 17.61 56% Interpretation: Although the FEV1 and FVC are reduced, the FEV1/FVC ratio is increased. The vital capacity is low, the residual volume is high indicating incomplete exhalation. Following administration of bronchodilators, there is no significant response. The reduced diffusing capacity indicates a mild loss of functional alveolar capillary surface. 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  On review of his follow-up scans it still appears that the  patient would require pneumonectomy at least.  Risks and options and expectations from surgery were discussed with the patient and his wife in detail.  He does not appear that pneumonectomy at this point would improve his overall survival.  Patient will follow up with medical oncology recommendation to continue withconsolidation treatment with immunotherapy with Imfinzi (Durvalumab) 10 MG/KG every 2 weeks for a total of one year    #2 stage IIIA GFR 45-59 chronic kidney disease   #3 history of Prostate Ca   Grace Isaac MD  [address] [place] [address] Office [phone]   Beeper [phone]  05/08/2018 7:38 PM

## 2018-05-09 ENCOUNTER — Other Ambulatory Visit: Payer: Self-pay | Admitting: Internal Medicine

## 2018-05-09 ENCOUNTER — Telehealth: Payer: Self-pay | Admitting: Medical Oncology

## 2018-05-09 DIAGNOSIS — I10 Essential (primary) hypertension: Secondary | ICD-10-CM

## 2018-05-09 DIAGNOSIS — C3411 Malignant neoplasm of upper lobe, right bronchus or lung: Secondary | ICD-10-CM

## 2018-05-09 DIAGNOSIS — R5382 Chronic fatigue, unspecified: Secondary | ICD-10-CM

## 2018-05-09 NOTE — Progress Notes (Signed)
DISCONTINUE ON PATHWAY REGIMEN - Non-Small Cell Lung     Administer weekly:     Paclitaxel      Carboplatin   **Always confirm dose/schedule in your pharmacy ordering system**  REASON: Continuation Of Treatment PRIOR TREATMENT: DEY814: Carboplatin AUC=2 + Paclitaxel 45 mg/m2 Weekly During Radiation TREATMENT RESPONSE: Partial Response (PR)  START ON PATHWAY REGIMEN - Non-Small Cell Lung     A cycle is every 14 days:     Durvalumab   **Always confirm dose/schedule in your pharmacy ordering system**  Patient Characteristics: Stage III - Unresectable, PS = 0, 1 AJCC T Category: T2b Current Disease Status: No Distant Mets or Local Recurrence AJCC N Category: N2 AJCC M Category: M0 AJCC 8 Stage Grouping: IIIA Performance Status: PS = 0, 1 Intent of Therapy: Curative Intent, Discussed with Patient

## 2018-05-09 NOTE — Telephone Encounter (Signed)
Chemo orders done.  He can start May 17, 2018.  Thank you

## 2018-05-09 NOTE — Telephone Encounter (Signed)
Per Malachy Mood , Pt is not a surgical candidate. He wants to get a "schedule for immunotherapy".

## 2018-05-10 ENCOUNTER — Telehealth: Payer: Self-pay | Admitting: Oncology

## 2018-05-10 NOTE — Telephone Encounter (Signed)
Error

## 2018-05-10 NOTE — Telephone Encounter (Signed)
Scheduled appt per 10/29 sch message - pt is aware or appt./

## 2018-05-11 ENCOUNTER — Encounter: Payer: Self-pay | Admitting: Radiation Oncology

## 2018-05-11 NOTE — Progress Notes (Signed)
  Radiation Oncology         [phone] ________________________________  Name: Darrell [name] MRN: [id number]  Date: 05/11/2018  DOB: Feb 19, 1946  End of Treatment Note  Diagnosis:  Lung cancer     Indication for treatment::  curative       Radiation treatment dates:   02/27/2018 - 04/13/2018  Site/dose:   The patient was treated to the disease within the right lung initially to a dose of 60 Gy using a 4 field, 3-D conformal technique. The patient then received a cone down boost treatment for an additional 6 Gy. This yielded a final total dose of 66 Gy.   Narrative: The patient tolerated radiation treatment relatively well. The patient did experience esophagitis during the course of treatment which required management. He was given Carafate, which provided relief. He denied shortness of breath throughout treatment. He noted productive cough with clear sputum that resolved by the end of treatment.   Plan: The patient has completed radiation treatment. The patient will return to radiation oncology clinic for routine followup in one month. I advised the patient to call or return sooner if they have any questions or concerns related to their recovery or treatment. ________________________________  Jodelle Gross, M.D., Ph.D.  This document serves as a record of services personally performed by Kyung Rudd, MD. It was created on his behalf by Wilburn Mylar, a trained medical scribe. The creation of this record is based on the scribe's personal observations and the provider's statements to them. This document has been checked and approved by the attending provider.

## 2018-05-15 ENCOUNTER — Encounter: Payer: Self-pay | Admitting: Radiation Oncology

## 2018-05-15 ENCOUNTER — Ambulatory Visit
Admission: RE | Admit: 2018-05-15 | Discharge: 2018-05-15 | Disposition: A | Discharge status: Home / Self Care | Payer: Medicare Other | Source: Ambulatory Visit | Attending: Radiation Oncology | Admitting: Radiation Oncology

## 2018-05-15 ENCOUNTER — Other Ambulatory Visit: Payer: Self-pay

## 2018-05-15 VITALS — BP 135/77 | HR 85 | Temp 98.5°F | Resp 20 | Ht 68.0 in | Wt 254.0 lb

## 2018-05-15 DIAGNOSIS — C3411 Malignant neoplasm of upper lobe, right bronchus or lung: Secondary | ICD-10-CM

## 2018-05-15 DIAGNOSIS — Z79899 Other long term (current) drug therapy: Secondary | ICD-10-CM | POA: Diagnosis not present

## 2018-05-15 DIAGNOSIS — C3490 Malignant neoplasm of unspecified part of unspecified bronchus or lung: Secondary | ICD-10-CM | POA: Insufficient documentation

## 2018-05-15 NOTE — Addendum Note (Signed)
Encounter addended by: Malena Edman, RN on: 05/15/2018 10:41 AM  Actions taken: Charge Capture section accepted

## 2018-05-15 NOTE — Progress Notes (Signed)
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  Interval Since Last Radiation: 5 weeks   02/27/2018 - 04/13/2018:  right hilum initially to a dose of 60 Gy using a 4 field, 3-D conformal technique. The patient then received a cone down boost treatment for an additional 6 Gy. This yielded a final total dose of 66 Gy.   Narrative:  The patient returns today for routine follow-up.  He reports he is eating and drinking well. He denies any skin changes and reports he is not having shortness of breath or chest pain.                               ALLERGIES:  is allergic to lisinopril.  Meds: Current Outpatient Medications  Medication Sig Dispense Refill  . ASTAXANTHIN PO Take 1 capsule by mouth daily.     . Blood Glucose Monitoring Suppl (ONE TOUCH ULTRA MINI) w/Device KIT 1 kit by Other route once.     . carvedilol (COREG) 6.25 MG tablet Take 6.25 mg by mouth 2 (two) times daily with a meal.     . Cinnamon Bark POWD Take 1,000 mg by mouth 2 (two) times daily.     . cloNIDine (CATAPRES) 0.1 MG tablet Take 0.1 mg by mouth 2 (two) times daily.     Marland Kitchen escitalopram (LEXAPRO) 10 MG tablet Take 10 mg by mouth daily.     . fenofibrate 160 MG tablet Take 160 mg by mouth daily.     . Fish Oil-Cholecalciferol (OMEGA-3 FISH OIL-VITAMIN D3) 1200-1000 MG-UNIT CAPS Take by mouth.    . Glucosamine Sulfate 1000 MG TABS Take 2,000 mg by mouth daily.     Marland Kitchen glucose blood (PRECISION QID TEST) test strip by Misc.(Non-Drug; Combo Route) route.    . hydrochlorothiazide (HYDRODIURIL) 25 MG tablet Take 25 mg by mouth daily.     . Insulin Detemir (LEVEMIR FLEXTOUCH) 100 UNIT/ML Pen Inject 10 Units into the skin daily.     . Insulin Pen Needle  (FIFTY50 PEN NEEDLES) 31G X 8 MM MISC USE AS DIRECTED    . Lancets MISC by Misc.(Non-Drug; Combo Route) route.    . lidocaine-prilocaine (EMLA) cream Apply 1 application topically as needed. 30 g 0  . losartan (COZAAR) 50 MG tablet Take 50 mg by mouth daily.     . magnesium oxide (MAG-OX) 400 MG tablet Take 400 mg by mouth daily.    . metFORMIN (GLUCOPHAGE) 1000 MG tablet Take 1,000 mg by mouth 2 (two) times daily with a meal.     . Multiple Vitamin (MULTI-VITAMINS) TABS Take 1 tablet by mouth daily.     . Omega-3 1000 MG CAPS Take 1,200 mg by mouth 2 (two) times daily.     . pioglitazone (ACTOS) 45 MG tablet Take 45 mg by mouth daily.     . pravastatin (PRAVACHOL) 40 MG tablet Take 40 mg by mouth daily.     . prochlorperazine (COMPAZINE) 10 MG tablet TAKE 1 TABLET BY MOUTH EVERY 6 HOURS AS NEEDED FOR NAUSEA OR VOMITING (Patient not taking: Reported on 05/15/2018) 385 tablet 0  . sucralfate (CARAFATE) 1 GM/10ML suspension Take  10 mLs (1 g total) by mouth every 6 (six) hours as needed. (Patient not taking: Reported on 05/15/2018) 420 mL 1  . traMADol (ULTRAM) 50 MG tablet Take 50 mg by mouth every 6 (six) hours as needed for severe pain.      No current facility-administered medications for this encounter.     Physical Findings:  height is 5' 8"  (1.727 m) and weight is 254 lb (115.2 kg). His oral temperature is 98.5 F (36.9 C). His blood pressure is 135/77 and his pulse is 85. His respiration is 20 and oxygen saturation is 100%.  Pain Assessment Pain Score: 0-No pain/10 In general this is a well appearing caucasian male in no acute distress. He's alert and oriented x4 and appropriate throughout the examination. Cardiopulmonary assessment is negative for acute distress and he exhibits normal effort with RRR, no C/R/M are noted. Chest reveals a slight rale along the right mid chest but is otherwise clear bilaterally.  Lab Findings: Lab Results  Component Value Date   WBC 3.2 (L) 05/01/2018    HGB 10.9 (L) 05/01/2018   HCT 33.7 (L) 05/01/2018   MCV 97.1 05/01/2018   PLT 157 05/01/2018     Radiographic Findings: Ct Chest W Contrast  Result Date: 05/01/2018 CLINICAL DATA:  Followup non-small cell lung cancer. EXAM: CT CHEST WITH CONTRAST TECHNIQUE: Multidetector CT imaging of the chest was performed during intravenous contrast administration. CONTRAST:  62m OMNIPAQUE IOHEXOL 300 MG/ML  SOLN COMPARISON:  CT chest from 01/31/2018 FINDINGS: Cardiovascular: Normal heart size. No pericardial effusion identified. Aortic atherosclerosis. Multi vessel coronary artery atherosclerotic calcifications. Mediastinum/Nodes: 1.5 cm low-density nodule arises from the inferior pole of right lobe of thyroid gland. The trachea appears patent and is midline. Normal appearance of the esophagus. No mediastinal adenopathy. Lungs/Pleura: The right upper lobe perihilar lung mass measures 4.3 x 1.9 by 2.9 cm, image 98/5. On the previous examination this measured 6.5 x 4.0 by 4.5 cm. Persistent subsegmental atelectasis within the medial right upper lobe. There has been resolution of previous in the luminal component involving the distal right bronchus. Upper Abdomen: Unchanged hypodensity along the anterior dome of liver which is too small to characterize measuring 8 mm, image 97/2. No acute abnormality within the visualized portions of the upper abdomen. Musculoskeletal: Spondylosis within the thoracic spine. No aggressive lytic or sclerotic bone lesions. IMPRESSION: 1. Interval decrease in size of right hilar lung mass. 2. No new or progressive findings identified. 3.  Aortic Atherosclerosis (ICD10-I70.0). 4. Coronary artery atherosclerotic calcifications. Electronically Signed   By: TKerby MoorsM.D.   On: 05/01/2018 14:35    Impression/Plan: 1.   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 MJulien Nordmann We will see  him back as needed. He was counseled on rationale to notify [place] questions or concerns or symptoms of pneumonitis.     ACarola Rhine PAC

## 2018-05-17 ENCOUNTER — Inpatient Hospital Stay: Payer: Medicare Other

## 2018-05-17 ENCOUNTER — Telehealth: Payer: Self-pay | Admitting: Internal Medicine

## 2018-05-17 ENCOUNTER — Inpatient Hospital Stay (HOSPITAL_BASED_OUTPATIENT_CLINIC_OR_DEPARTMENT_OTHER): Payer: Medicare Other | Admitting: Oncology

## 2018-05-17 ENCOUNTER — Inpatient Hospital Stay: Payer: Medicare Other | Attending: Internal Medicine

## 2018-05-17 ENCOUNTER — Encounter: Payer: Self-pay | Admitting: Oncology

## 2018-05-17 VITALS — BP 145/75 | HR 75 | Temp 98.3°F | Resp 12 | Ht 68.0 in | Wt 253.6 lb

## 2018-05-17 DIAGNOSIS — I700 Atherosclerosis of aorta: Secondary | ICD-10-CM

## 2018-05-17 DIAGNOSIS — E785 Hyperlipidemia, unspecified: Secondary | ICD-10-CM | POA: Diagnosis not present

## 2018-05-17 DIAGNOSIS — E041 Nontoxic single thyroid nodule: Secondary | ICD-10-CM | POA: Insufficient documentation

## 2018-05-17 DIAGNOSIS — N189 Chronic kidney disease, unspecified: Secondary | ICD-10-CM | POA: Diagnosis not present

## 2018-05-17 DIAGNOSIS — Z9221 Personal history of antineoplastic chemotherapy: Secondary | ICD-10-CM | POA: Insufficient documentation

## 2018-05-17 DIAGNOSIS — Z79899 Other long term (current) drug therapy: Secondary | ICD-10-CM

## 2018-05-17 DIAGNOSIS — I129 Hypertensive chronic kidney disease with stage 1 through stage 4 chronic kidney disease, or unspecified chronic kidney disease: Secondary | ICD-10-CM | POA: Insufficient documentation

## 2018-05-17 DIAGNOSIS — C3411 Malignant neoplasm of upper lobe, right bronchus or lung: Secondary | ICD-10-CM | POA: Insufficient documentation

## 2018-05-17 DIAGNOSIS — Z923 Personal history of irradiation: Secondary | ICD-10-CM | POA: Insufficient documentation

## 2018-05-17 DIAGNOSIS — Z5112 Encounter for antineoplastic immunotherapy: Secondary | ICD-10-CM | POA: Insufficient documentation

## 2018-05-17 DIAGNOSIS — E119 Type 2 diabetes mellitus without complications: Secondary | ICD-10-CM | POA: Insufficient documentation

## 2018-05-17 DIAGNOSIS — R5382 Chronic fatigue, unspecified: Secondary | ICD-10-CM

## 2018-05-17 DIAGNOSIS — Z794 Long term (current) use of insulin: Secondary | ICD-10-CM | POA: Insufficient documentation

## 2018-05-17 LAB — CBC WITH DIFFERENTIAL (CANCER CENTER ONLY)
Abs Immature Granulocytes: 0.01 10*3/uL (ref 0.00–0.07)
Basophils Absolute: 0 10*3/uL (ref 0.0–0.1)
Basophils Relative: 1 %
Eosinophils Absolute: 0.2 10*3/uL (ref 0.0–0.5)
Eosinophils Relative: 6 %
HCT: 33.6 % — ABNORMAL LOW (ref 39.0–52.0)
Hemoglobin: 10.9 g/dL — ABNORMAL LOW (ref 13.0–17.0)
Immature Granulocytes: 0 %
Lymphocytes Relative: 14 %
Lymphs Abs: 0.5 10*3/uL — ABNORMAL LOW (ref 0.7–4.0)
MCH: 31.6 pg (ref 26.0–34.0)
MCHC: 32.4 g/dL (ref 30.0–36.0)
MCV: 97.4 fL (ref 80.0–100.0)
Monocytes Absolute: 0.5 10*3/uL (ref 0.1–1.0)
Monocytes Relative: 12 %
Neutro Abs: 2.5 10*3/uL (ref 1.7–7.7)
Neutrophils Relative %: 67 %
Platelet Count: 159 10*3/uL (ref 150–400)
RBC: 3.45 MIL/uL — ABNORMAL LOW (ref 4.22–5.81)
RDW: 18.8 % — ABNORMAL HIGH (ref 11.5–15.5)
WBC Count: 3.8 10*3/uL — ABNORMAL LOW (ref 4.0–10.5)
nRBC: 0 % (ref 0.0–0.2)

## 2018-05-17 LAB — CMP (CANCER CENTER ONLY)
ALT: 12 U/L (ref 0–44)
AST: 18 U/L (ref 15–41)
Albumin: 3.5 g/dL (ref 3.5–5.0)
Alkaline Phosphatase: 54 U/L (ref 38–126)
Anion gap: 9 (ref 5–15)
BUN: 45 mg/dL — ABNORMAL HIGH (ref 8–23)
CO2: 26 mmol/L (ref 22–32)
Calcium: 9.3 mg/dL (ref 8.9–10.3)
Chloride: 102 mmol/L (ref 98–111)
Creatinine: 1.83 mg/dL — ABNORMAL HIGH (ref 0.61–1.24)
GFR, Est AFR Am: 42 mL/min — ABNORMAL LOW (ref >60)
GFR, Estimated: 36 mL/min — ABNORMAL LOW (ref >60)
Glucose, Bld: 110 mg/dL — ABNORMAL HIGH (ref 70–99)
Potassium: 4.1 mmol/L (ref 3.5–5.1)
Sodium: 137 mmol/L (ref 135–145)
Total Bilirubin: 0.3 mg/dL (ref 0.3–1.2)
Total Protein: 6.7 g/dL (ref 6.5–8.1)

## 2018-05-17 LAB — TSH: TSH: 1.516 u[IU]/mL (ref 0.320–4.118)

## 2018-05-17 MED ORDER — SODIUM CHLORIDE 0.9% FLUSH
10.0000 mL | INTRAVENOUS | Status: DC | PRN
  Administered 2018-05-17: 10 mL
  Filled 2018-05-17: qty 10

## 2018-05-17 MED ORDER — HEPARIN SOD (PORK) LOCK FLUSH 100 UNIT/ML IV SOLN
500.0000 [IU] | Freq: Once | INTRAVENOUS | Status: AC | PRN
  Administered 2018-05-17: 500 [IU]
  Filled 2018-05-17: qty 5

## 2018-05-17 MED ORDER — SODIUM CHLORIDE 0.9 % IV SOLN
Freq: Once | INTRAVENOUS | Status: AC
  Administered 2018-05-17: 15:00:00 via INTRAVENOUS
  Filled 2018-05-17: qty 250

## 2018-05-17 MED ORDER — SODIUM CHLORIDE 0.9 % IV SOLN
10.0000 mg/kg | Freq: Once | INTRAVENOUS | Status: AC
  Administered 2018-05-17: 1120 mg via INTRAVENOUS
  Filled 2018-05-17: qty 20

## 2018-05-17 NOTE — Telephone Encounter (Signed)
Appts scheduled avs declined/calendar printed per 11/6 los

## 2018-05-17 NOTE — Progress Notes (Signed)
[facility] OFFICE PROGRESS NOTE  [name], MD [place] [address]  DIAGNOSIS:Stage IIb/IIIa (T2b, N0/N2,M0),non-small cell lung cancer, squamous cell carcinoma diagnosed in July 2019 and presented with large right hilar mass with questionable mediastinal invasion  PRIOR THERAPY: Concurrent chemoradiation with weekly carboplatin for AUC of 2 and paclitaxel 45 mg/M2. First dose 02/27/2018.Status post 5 cycles.  CURRENT THERAPY:  Consolidation immunotherapy with Imfinzi 10 mg/kg every 2 weeks.  First dose given on 05/17/2018.  INTERVAL HISTORY: Darrell [name] 72 y.o. male returns for routine follow-up visit accompanied by his wife.  The patient is feeling fine and has no specific complaints.  He denies fevers and chills.  Denies chest pain, shortness of breath, cough, hemoptysis.  Denies nausea, vomiting, constipation, diarrhea.  Denies recent weight loss or night sweats.  The patient was seen by Dr. Servando Snare recently for discussion of possible surgery.  The patient would require a pneumonectomy and surgery will not be pursued.  The patient is here for evaluation prior to cycle #1 of Imfinzi.  MEDICAL HISTORY: Past Medical History:  Diagnosis Date  . Cellulitis of right lower extremity   . Chronic kidney disease   . Diabetes mellitus without complication ([place])   . Dyslipidemia   . Hypertension     ALLERGIES:  is allergic to lisinopril.  MEDICATIONS:  Current Outpatient Medications  Medication Sig Dispense Refill  . ASTAXANTHIN PO Take 1 capsule by mouth daily.     . Blood Glucose Monitoring Suppl (ONE TOUCH ULTRA MINI) w/Device KIT 1 kit by Other route once.     . carvedilol (COREG) 6.25 MG tablet Take 6.25 mg by mouth 2 (two) times daily with a meal.     . Cinnamon Bark POWD Take 1,000 mg by mouth 2 (two) times daily.     . cloNIDine (CATAPRES) 0.1 MG tablet Take 0.1 mg by mouth 2 (two) times daily.     Marland Kitchen escitalopram (LEXAPRO)  10 MG tablet Take 10 mg by mouth daily.     . fenofibrate 160 MG tablet Take 160 mg by mouth daily.     . Fish Oil-Cholecalciferol (OMEGA-3 FISH OIL-VITAMIN D3) 1200-1000 MG-UNIT CAPS Take by mouth.    . Glucosamine Sulfate 1000 MG TABS Take 2,000 mg by mouth daily.     Marland Kitchen glucose blood (PRECISION QID TEST) test strip by Misc.(Non-Drug; Combo Route) route.    . hydrochlorothiazide (HYDRODIURIL) 25 MG tablet Take 25 mg by mouth daily.     . Insulin Detemir (LEVEMIR FLEXTOUCH) 100 UNIT/ML Pen Inject 10 Units into the skin daily.     . Insulin Pen Needle (FIFTY50 PEN NEEDLES) 31G X 8 MM MISC USE AS DIRECTED    . Lancets MISC by Misc.(Non-Drug; Combo Route) route.    . lidocaine-prilocaine (EMLA) cream Apply 1 application topically as needed. 30 g 0  . losartan (COZAAR) 50 MG tablet Take 50 mg by mouth daily.     . magnesium oxide (MAG-OX) 400 MG tablet Take 400 mg by mouth daily.    . metFORMIN (GLUCOPHAGE) 1000 MG tablet Take 1,000 mg by mouth 2 (two) times daily with a meal.     . Multiple Vitamin (MULTI-VITAMINS) TABS Take 1 tablet by mouth daily.     . Omega-3 1000 MG CAPS Take 1,200 mg by mouth 2 (two) times daily.     . pioglitazone (ACTOS) 45 MG tablet Take 45 mg by mouth daily.     . pravastatin (PRAVACHOL)  40 MG tablet Take 40 mg by mouth daily.     . prochlorperazine (COMPAZINE) 10 MG tablet TAKE 1 TABLET BY MOUTH EVERY 6 HOURS AS NEEDED FOR NAUSEA OR VOMITING (Patient not taking: Reported on 05/15/2018) 385 tablet 0  . sucralfate (CARAFATE) 1 GM/10ML suspension Take 10 mLs (1 g total) by mouth every 6 (six) hours as needed. (Patient not taking: Reported on 05/15/2018) 420 mL 1  . traMADol (ULTRAM) 50 MG tablet Take 50 mg by mouth every 6 (six) hours as needed for severe pain.      No current facility-administered medications for this visit.    Facility-Administered Medications Ordered in Other Visits  Medication Dose Route Frequency Provider Last Rate Last Dose  . durvalumab (IMFINZI)  1,120 mg in sodium chloride 0.9 % 100 mL chemo infusion  10 mg/kg (Treatment Plan Recorded) Intravenous Once Curt Bears, MD 122 mL/hr at 05/17/18 1533 1,120 mg at 05/17/18 1533  . heparin lock flush 100 unit/mL  500 Units Intracatheter Once PRN Curt Bears, MD      . sodium chloride flush (NS) 0.9 % injection 10 mL  10 mL Intracatheter PRN Curt Bears, MD        SURGICAL HISTORY:  Past Surgical History:  Procedure Laterality Date  . PORTACATH PLACEMENT Left 02/21/2018   Procedure: INSERTION PORT-A-CATH;  Surgeon: Grace Isaac, MD;  Location: [facility] OR;  Service: Thoracic;  Laterality: Left;    REVIEW OF SYSTEMS:   Review of Systems  Constitutional: Negative for appetite change, chills, fatigue, fever and unexpected weight change.  HENT:   Negative for mouth sores, nosebleeds, sore throat and trouble swallowing.   Eyes: Negative for eye problems and icterus.  Respiratory: Negative for cough, hemoptysis, shortness of breath and wheezing.   Cardiovascular: Negative for chest pain and leg swelling.  Gastrointestinal: Negative for abdominal pain, constipation, diarrhea, nausea and vomiting.  Genitourinary: Negative for bladder incontinence, difficulty urinating, dysuria, frequency and hematuria.   Musculoskeletal: Negative for back pain, gait problem, neck pain and neck stiffness.  Skin: Negative for itching and rash.  Neurological: Negative for dizziness, extremity weakness, gait problem, headaches, light-headedness and seizures.  Hematological: Negative for adenopathy. Does not bruise/bleed easily.  Psychiatric/Behavioral: Negative for confusion, depression and sleep disturbance. The patient is not nervous/anxious.     PHYSICAL EXAMINATION:  Blood pressure (!) 145/75, pulse 75, temperature 98.3 F (36.8 C), temperature source Oral, resp. rate 12, height '5\' 8"'$  (1.727 m), weight 253 lb 9.6 oz (115 kg), SpO2 99 %.  ECOG PERFORMANCE STATUS: 1 - Symptomatic but completely  ambulatory  Physical Exam  Constitutional: Oriented to person, place, and time and well-developed, well-nourished, and in no distress. No distress.  HENT:  Head: Normocephalic and atraumatic.  Mouth/Throat: Oropharynx is clear and moist. No oropharyngeal exudate.  Eyes: Conjunctivae are normal. Right eye exhibits no discharge. Left eye exhibits no discharge. No scleral icterus.  Neck: Normal range of motion. Neck supple.  Cardiovascular: Normal rate, regular rhythm, normal heart sounds and intact distal pulses.   Pulmonary/Chest: Effort normal and breath sounds normal. No respiratory distress. No wheezes. No rales.  Abdominal: Soft. Bowel sounds are normal. Exhibits no distension and no mass. There is no tenderness.  Musculoskeletal: Normal range of motion. Exhibits no edema.  Lymphadenopathy:    No cervical adenopathy.  Neurological: Alert and oriented to person, place, and time. Exhibits normal muscle tone. Gait normal. Coordination normal.  Skin: Skin is warm and dry. No rash noted. Not diaphoretic. No erythema.  No pallor.  Psychiatric: Mood, memory and judgment normal.  Vitals reviewed.  LABORATORY DATA: Lab Results  Component Value Date   WBC 3.8 (L) 05/17/2018   HGB 10.9 (L) 05/17/2018   HCT 33.6 (L) 05/17/2018   MCV 97.4 05/17/2018   PLT 159 05/17/2018      Chemistry      Component Value Date/Time   NA 137 05/17/2018 1235   K 4.1 05/17/2018 1235   CL 102 05/17/2018 1235   CO2 26 05/17/2018 1235   BUN 45 (H) 05/17/2018 1235   CREATININE 1.83 (H) 05/17/2018 1235      Component Value Date/Time   CALCIUM 9.3 05/17/2018 1235   ALKPHOS 54 05/17/2018 1235   AST 18 05/17/2018 1235   ALT 12 05/17/2018 1235   BILITOT 0.3 05/17/2018 1235       RADIOGRAPHIC STUDIES:  Ct Chest W Contrast  Result Date: 05/01/2018 CLINICAL DATA:  Followup non-small cell lung cancer. EXAM: CT CHEST WITH CONTRAST TECHNIQUE: Multidetector CT imaging of the chest was performed during  intravenous contrast administration. CONTRAST:  38m OMNIPAQUE IOHEXOL 300 MG/ML  SOLN COMPARISON:  CT chest from 01/31/2018 FINDINGS: Cardiovascular: Normal heart size. No pericardial effusion identified. Aortic atherosclerosis. Multi vessel coronary artery atherosclerotic calcifications. Mediastinum/Nodes: 1.5 cm low-density nodule arises from the inferior pole of right lobe of thyroid gland. The trachea appears patent and is midline. Normal appearance of the esophagus. No mediastinal adenopathy. Lungs/Pleura: The right upper lobe perihilar lung mass measures 4.3 x 1.9 by 2.9 cm, image 98/5. On the previous examination this measured 6.5 x 4.0 by 4.5 cm. Persistent subsegmental atelectasis within the medial right upper lobe. There has been resolution of previous in the luminal component involving the distal right bronchus. Upper Abdomen: Unchanged hypodensity along the anterior dome of liver which is too small to characterize measuring 8 mm, image 97/2. No acute abnormality within the visualized portions of the upper abdomen. Musculoskeletal: Spondylosis within the thoracic spine. No aggressive lytic or sclerotic bone lesions. IMPRESSION: 1. Interval decrease in size of right hilar lung mass. 2. No new or progressive findings identified. 3.  Aortic Atherosclerosis (ICD10-I70.0). 4. Coronary artery atherosclerotic calcifications. Electronically Signed   By: TKerby MoorsM.D.   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 The patient is currently undergoing a course of concurrent chemoradiation with weekly carboplatin and paclitaxel status post 5 cycles.  He tolerated the treatment well overall with the exception of mild odynophagia and neutropenia.   The patient is now here to begin consolidation treatment with immunotherapy with  Imfinzi (Durvalumab) 10 MG/KG every 2 weeks for a total of one year unless the patient developed toxicity or disease progression. I have reviewed the adverse effect of this treatment that were previously discussed with him including but not limited to immunotherapy mediated skin rash, diarrhea, or inflammation of the lung, kidney, liver, thyroid or other endocrine dysfunction including type 1 diabetes mellitus.    The patient is agreeable to proceed.  He will proceed with cycle #1 of Imfinzi today as scheduled.  His creatinine is 1.83 which is consistent with baseline.  Okay to proceed with treatment with a creatinine of this level.  The patient will follow-up in 2 weeks for evaluation prior to cycle #2.  He was advised to call immediately if he  has any concerning symptoms in the interval. The patient voices understanding of current disease status and treatment options and is in agreement with the current care plan.  All questions were answered. The patient knows to call the clinic with any problems, questions or concerns. We can certainly see the patient much sooner if necessary.   No orders of the defined types were placed in this encounter.    Mikey Bussing, DNP, AGPCNP-BC, AOCNP 05/17/18

## 2018-05-17 NOTE — Progress Notes (Signed)
Per Erasmo Downer Curcio-NP: OK to treat with Creatinine of 1.83

## 2018-05-17 NOTE — Patient Instructions (Signed)
[facility] Discharge Instructions for Patients Receiving Chemotherapy  Today you received the following chemotherapy agents: Durvalumab (Imfinzi)  To help prevent nausea and vomiting after your treatment, we encourage you to take your nausea medication as directed.    If you develop nausea and vomiting that is not controlled by your nausea medication, call the clinic.   BELOW ARE SYMPTOMS THAT SHOULD BE REPORTED IMMEDIATELY:  *FEVER GREATER THAN 100.5 F  *CHILLS WITH OR WITHOUT FEVER  NAUSEA AND VOMITING THAT IS NOT CONTROLLED WITH YOUR NAUSEA MEDICATION  *UNUSUAL SHORTNESS OF BREATH  *UNUSUAL BRUISING OR BLEEDING  TENDERNESS IN MOUTH AND THROAT WITH OR WITHOUT PRESENCE OF ULCERS  *URINARY PROBLEMS  *BOWEL PROBLEMS  UNUSUAL RASH Items with * indicate a potential emergency and should be followed up as soon as possible.  Feel free to call the clinic should you have any questions or concerns. The clinic phone number is [phone].  Please show the [facility] at check-in to the Emergency Department and triage nurse.  Durvalumab injection What is this medicine? DURVALUMAB (dur VAL ue mab) is a monoclonal antibody. It is used to treat urothelial cancer. This medicine may be used for other purposes; ask your health care provider or pharmacist if you have questions. COMMON BRAND NAME(S): IMFINZI What should I tell my health care provider before I take this medicine? They need to know if you have any of these conditions: -diabetes -immune system problems -infection -inflammatory bowel disease -kidney disease -liver disease -lung or breathing disease -lupus -organ transplant -stomach or intestine problems -thyroid disease -an unusual or allergic reaction to durvalumab, other medicines, foods, dyes, or preservatives -pregnant or trying to get pregnant -breast-feeding How should I use this medicine? This medicine is for infusion into a vein. It is  given by a health care professional in a hospital or clinic setting. A special MedGuide will be given to you before each treatment. Be sure to read this information carefully each time. Talk to your pediatrician regarding the use of this medicine in children. Special care may be needed. Overdosage: If you think you have taken too much of this medicine contact a poison control center or emergency room at once. NOTE: This medicine is only for you. Do not share this medicine with others. What if I miss a dose? It is important not to miss your dose. Call your doctor or health care professional if you are unable to keep an appointment. What may interact with this medicine? Interactions have not been studied. This list may not describe all possible interactions. Give your health care provider a list of all the medicines, herbs, non-prescription drugs, or dietary supplements you use. Also tell them if you smoke, drink alcohol, or use illegal drugs. Some items may interact with your medicine. What should I watch for while using this medicine? This drug may make you feel generally unwell. Continue your course of treatment even though you feel ill unless your doctor tells you to stop. You may need blood work done while you are taking this medicine. Do not become pregnant while taking this medicine or for 3 months after stopping it. Women should inform their doctor if they wish to become pregnant or think they might be pregnant. There is a potential for serious side effects to an unborn child. Talk to your health care professional or pharmacist for more information. Do not breast-feed an infant while taking this medicine or for 3 months after stopping it. What side  effects may I notice from receiving this medicine? Side effects that you should report to your doctor or health care professional as soon as possible: -allergic reactions like skin rash, itching or hives, swelling of the face, lips, or  tongue -black, tarry stools -bloody or watery diarrhea -breathing problems -change in emotions or moods -change in sex drive -changes in vision -chest pain or chest tightness -chills -confusion -cough -facial flushing -fever -headache -signs and symptoms of high blood sugar such as dizziness; dry mouth; dry skin; fruity breath; nausea; stomach pain; increased hunger or thirst; increased urination -signs and symptoms of liver injury like dark yellow or brown urine; general ill feeling or flu-like symptoms; light-colored stools; loss of appetite; nausea; right upper belly pain; unusually weak or tired; yellowing of the eyes or skin -stomach pain -trouble passing urine or change in the amount of urine -weight gain or weight loss Side effects that usually do not require medical attention (report these to your doctor or health care professional if they continue or are bothersome): -bone pain -constipation -loss of appetite -muscle pain -nausea -swelling of the ankles, feet, hands -tiredness This list may not describe all possible side effects. Call your doctor for medical advice about side effects. You may report side effects to FDA at 1-800-FDA-1088. Where should I keep my medicine? This drug is given in a hospital or clinic and will not be stored at home. NOTE: This sheet is a summary. It may not cover all possible information. If you have questions about this medicine, talk to your doctor, pharmacist, or health care provider.  2018 Elsevier/Gold Standard (2016-01-30 15:50:36)

## 2018-05-17 NOTE — Assessment & Plan Note (Addendum)
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 The patient is currently undergoing a course of concurrent chemoradiation with weekly carboplatin and paclitaxel status post 5 cycles.  He tolerated the treatment well overall with the exception of mild odynophagia and neutropenia.   The patient is now here to begin consolidation treatment with immunotherapy with Imfinzi (Durvalumab) 10 MG/KG every 2 weeks for a total of one year unless the patient developed toxicity or disease progression. I have reviewed the adverse effect of this treatment that were previously discussed with him including but not limited to immunotherapy mediated skin rash, diarrhea, or inflammation of the lung, kidney, liver, thyroid or other endocrine dysfunction including type 1 diabetes mellitus.    The patient is agreeable to proceed.  He will proceed with cycle #1 of Imfinzi today as scheduled.  His creatinine is 1.83 which is consistent with baseline.  Okay to proceed with treatment with a creatinine of this level.  The patient will follow-up in 2 weeks for evaluation prior to cycle #2.  He was advised to call immediately if he has any concerning symptoms in the interval. The patient voices understanding of current disease status and treatment options and is in agreement with the current care plan.  All questions were answered. The patient knows to call the clinic with any problems, questions or concerns. We can certainly see the patient much sooner if necessary.

## 2018-05-18 ENCOUNTER — Telehealth: Payer: Self-pay | Admitting: Medical Oncology

## 2018-05-18 NOTE — Telephone Encounter (Addendum)
Per Dr [name] it is okay for pt to have dental work . Called to Dr Kieth Brightly voice mail.

## 2018-05-19 NOTE — Telephone Encounter (Signed)
LVM for immunotherapy f/u call .

## 2018-05-31 ENCOUNTER — Inpatient Hospital Stay: Payer: Medicare Other

## 2018-05-31 ENCOUNTER — Encounter: Payer: Self-pay | Admitting: Oncology

## 2018-05-31 ENCOUNTER — Inpatient Hospital Stay (HOSPITAL_BASED_OUTPATIENT_CLINIC_OR_DEPARTMENT_OTHER): Payer: Medicare Other | Admitting: Oncology

## 2018-05-31 VITALS — BP 134/69 | HR 81 | Temp 98.3°F | Resp 16 | Ht 68.0 in | Wt 259.1 lb

## 2018-05-31 DIAGNOSIS — N189 Chronic kidney disease, unspecified: Secondary | ICD-10-CM | POA: Diagnosis not present

## 2018-05-31 DIAGNOSIS — C3411 Malignant neoplasm of upper lobe, right bronchus or lung: Secondary | ICD-10-CM

## 2018-05-31 DIAGNOSIS — Z79899 Other long term (current) drug therapy: Secondary | ICD-10-CM

## 2018-05-31 DIAGNOSIS — I700 Atherosclerosis of aorta: Secondary | ICD-10-CM

## 2018-05-31 DIAGNOSIS — E041 Nontoxic single thyroid nodule: Secondary | ICD-10-CM

## 2018-05-31 DIAGNOSIS — E119 Type 2 diabetes mellitus without complications: Secondary | ICD-10-CM

## 2018-05-31 DIAGNOSIS — Z9221 Personal history of antineoplastic chemotherapy: Secondary | ICD-10-CM

## 2018-05-31 DIAGNOSIS — I129 Hypertensive chronic kidney disease with stage 1 through stage 4 chronic kidney disease, or unspecified chronic kidney disease: Secondary | ICD-10-CM | POA: Diagnosis not present

## 2018-05-31 DIAGNOSIS — Z794 Long term (current) use of insulin: Secondary | ICD-10-CM

## 2018-05-31 DIAGNOSIS — Z95828 Presence of other vascular implants and grafts: Secondary | ICD-10-CM

## 2018-05-31 DIAGNOSIS — Z923 Personal history of irradiation: Secondary | ICD-10-CM

## 2018-05-31 DIAGNOSIS — Z5112 Encounter for antineoplastic immunotherapy: Secondary | ICD-10-CM | POA: Diagnosis not present

## 2018-05-31 DIAGNOSIS — E785 Hyperlipidemia, unspecified: Secondary | ICD-10-CM

## 2018-05-31 LAB — CBC WITH DIFFERENTIAL (CANCER CENTER ONLY)
Abs Immature Granulocytes: 0.01 10*3/uL (ref 0.00–0.07)
Basophils Absolute: 0.1 10*3/uL (ref 0.0–0.1)
Basophils Relative: 1 %
Eosinophils Absolute: 0.2 10*3/uL (ref 0.0–0.5)
Eosinophils Relative: 5 %
HCT: 32.7 % — ABNORMAL LOW (ref 39.0–52.0)
Hemoglobin: 10.5 g/dL — ABNORMAL LOW (ref 13.0–17.0)
Immature Granulocytes: 0 %
Lymphocytes Relative: 17 %
Lymphs Abs: 0.7 10*3/uL (ref 0.7–4.0)
MCH: 31.5 pg (ref 26.0–34.0)
MCHC: 32.1 g/dL (ref 30.0–36.0)
MCV: 98.2 fL (ref 80.0–100.0)
Monocytes Absolute: 0.5 10*3/uL (ref 0.1–1.0)
Monocytes Relative: 12 %
Neutro Abs: 2.7 10*3/uL (ref 1.7–7.7)
Neutrophils Relative %: 65 %
Platelet Count: 176 10*3/uL (ref 150–400)
RBC: 3.33 MIL/uL — ABNORMAL LOW (ref 4.22–5.81)
RDW: 17.5 % — ABNORMAL HIGH (ref 11.5–15.5)
WBC Count: 4.2 10*3/uL (ref 4.0–10.5)
nRBC: 0 % (ref 0.0–0.2)

## 2018-05-31 LAB — CMP (CANCER CENTER ONLY)
ALT: 11 U/L (ref 0–44)
AST: 20 U/L (ref 15–41)
Albumin: 3.4 g/dL — ABNORMAL LOW (ref 3.5–5.0)
Alkaline Phosphatase: 53 U/L (ref 38–126)
Anion gap: 10 (ref 5–15)
BUN: 34 mg/dL — ABNORMAL HIGH (ref 8–23)
CO2: 28 mmol/L (ref 22–32)
Calcium: 9.9 mg/dL (ref 8.9–10.3)
Chloride: 102 mmol/L (ref 98–111)
Creatinine: 1.58 mg/dL — ABNORMAL HIGH (ref 0.61–1.24)
GFR, Est AFR Am: 50 mL/min — ABNORMAL LOW (ref >60)
GFR, Estimated: 43 mL/min — ABNORMAL LOW (ref >60)
Glucose, Bld: 104 mg/dL — ABNORMAL HIGH (ref 70–99)
Potassium: 4 mmol/L (ref 3.5–5.1)
Sodium: 140 mmol/L (ref 135–145)
Total Bilirubin: 0.4 mg/dL (ref 0.3–1.2)
Total Protein: 6.9 g/dL (ref 6.5–8.1)

## 2018-05-31 MED ORDER — SODIUM CHLORIDE 0.9% FLUSH
10.0000 mL | INTRAVENOUS | Status: DC | PRN
  Administered 2018-05-31: 10 mL
  Filled 2018-05-31: qty 10

## 2018-05-31 MED ORDER — SODIUM CHLORIDE 0.9 % IV SOLN
10.0000 mg/kg | Freq: Once | INTRAVENOUS | Status: AC
  Administered 2018-05-31: 1120 mg via INTRAVENOUS
  Filled 2018-05-31: qty 20

## 2018-05-31 MED ORDER — SODIUM CHLORIDE 0.9 % IV SOLN
Freq: Once | INTRAVENOUS | Status: AC
  Administered 2018-05-31: 14:00:00 via INTRAVENOUS
  Filled 2018-05-31: qty 250

## 2018-05-31 MED ORDER — HEPARIN SOD (PORK) LOCK FLUSH 100 UNIT/ML IV SOLN
500.0000 [IU] | Freq: Once | INTRAVENOUS | Status: AC | PRN
  Administered 2018-05-31: 500 [IU]
  Filled 2018-05-31: qty 5

## 2018-05-31 NOTE — Progress Notes (Signed)
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 First dose 02/27/2018.Status post 5 cycles.  CURRENT THERAPY: Consolidation immunotherapy with Imfinzi 10 mg/kg every 2 weeks.  First dose given on 05/17/2018.  Status post 1 cycle.  INTERVAL HISTORY: EARLY [employer] 72 y.o. male returns for routine follow-up visit accompanied by his wife.  The patient is feeling fine today and has no specific complaints.  He denies fevers and chills.  Denies chest pain, shortness of breath, cough, hemoptysis.  Denies nausea, vomiting, constipation, diarrhea.  Denies recent weight loss or night sweats.  He tolerated his first cycle of Imfinzi fairly well.  The patient is here for evaluation prior to cycle #2.  MEDICAL HISTORY: Past Medical History:  Diagnosis Date  . Cellulitis of right lower extremity   . Chronic kidney disease   . Diabetes mellitus without complication ([place])   . Dyslipidemia   . Hypertension     ALLERGIES:  is allergic to lisinopril.  MEDICATIONS:  Current Outpatient Medications  Medication Sig Dispense Refill  . ASTAXANTHIN PO Take 1 capsule by mouth daily.     . Blood Glucose Monitoring Suppl (ONE TOUCH ULTRA MINI) w/Device KIT 1 kit by Other route once.     . carvedilol (COREG) 6.25 MG tablet Take 6.25 mg by mouth 2 (two) times daily with a meal.     . Cinnamon Bark POWD Take 1,000 mg by mouth 2 (two) times daily.     . cloNIDine (CATAPRES) 0.1 MG tablet Take 0.1 mg by mouth 2 (two) times daily.     Marland Kitchen escitalopram (LEXAPRO) 10 MG tablet Take 10 mg by mouth daily.     . fenofibrate 160 MG tablet Take 160 mg by  mouth daily.     . Fish Oil-Cholecalciferol (OMEGA-3 FISH OIL-VITAMIN D3) 1200-1000 MG-UNIT CAPS Take by mouth.    . Glucosamine Sulfate 1000 MG TABS Take 2,000 mg by mouth daily.     Marland Kitchen glucose blood (PRECISION QID TEST) test strip by Misc.(Non-Drug; Combo Route) route.    . hydrochlorothiazide (HYDRODIURIL) 25 MG tablet Take 25 mg by mouth daily.     . Insulin Detemir (LEVEMIR FLEXTOUCH) 100 UNIT/ML Pen Inject 10 Units into the skin daily.     . Insulin Pen Needle (FIFTY50 PEN NEEDLES) 31G X 8 MM MISC USE AS DIRECTED    . Lancets MISC by Misc.(Non-Drug; Combo Route) route.    . lidocaine-prilocaine (EMLA) cream Apply 1 application topically as needed. 30 g 0  . losartan (COZAAR) 50 MG tablet Take 50 mg by mouth daily.     . magnesium oxide (MAG-OX) 400 MG tablet Take 400 mg by mouth daily.    . metFORMIN (GLUCOPHAGE) 1000 MG tablet Take 1,000 mg by mouth 2 (two) times daily with a meal.     . Multiple Vitamin (MULTI-VITAMINS) TABS Take 1 tablet by mouth daily.     . Omega-3 1000 MG CAPS Take 1,200 mg by mouth 2 (two) times daily.     . pioglitazone (ACTOS) 45 MG tablet Take 45 mg by mouth daily.     . pravastatin (PRAVACHOL) 40 MG tablet Take 40 mg by mouth daily.     Marland Kitchen  traMADol (ULTRAM) 50 MG tablet Take 50 mg by mouth every 6 (six) hours as needed for severe pain.     Marland Kitchen prochlorperazine (COMPAZINE) 10 MG tablet TAKE 1 TABLET BY MOUTH EVERY 6 HOURS AS NEEDED FOR NAUSEA OR VOMITING (Patient not taking: Reported on 05/15/2018) 385 tablet 0   No current facility-administered medications for this visit.     SURGICAL HISTORY:  Past Surgical History:  Procedure Laterality Date  . PORTACATH PLACEMENT Left 02/21/2018   Procedure: INSERTION PORT-A-CATH;  Surgeon: Grace Isaac, MD;  Location: [facility] OR;  Service: Thoracic;  Laterality: Left;    REVIEW OF SYSTEMS:   Review of Systems  Constitutional: Negative for appetite change, chills, fatigue, fever and unexpected weight change.  HENT:    Negative for mouth sores, nosebleeds, sore throat and trouble swallowing.   Eyes: Negative for eye problems and icterus.  Respiratory: Negative for cough, hemoptysis, shortness of breath and wheezing.   Cardiovascular: Negative for chest pain and leg swelling.  Gastrointestinal: Negative for abdominal pain, constipation, diarrhea, nausea and vomiting.  Genitourinary: Negative for bladder incontinence, difficulty urinating, dysuria, frequency and hematuria.   Musculoskeletal: Negative for back pain, gait problem, neck pain and neck stiffness.  Skin: Negative for itching and rash.  Neurological: Negative for dizziness, extremity weakness, gait problem, headaches, light-headedness and seizures.  Hematological: Negative for adenopathy. Does not bruise/bleed easily.  Psychiatric/Behavioral: Negative for confusion, depression and sleep disturbance. The patient is not nervous/anxious.     PHYSICAL EXAMINATION:  Blood pressure 134/69, pulse 81, temperature 98.3 F (36.8 C), temperature source Oral, resp. rate 16, height _0  (1.727 m), weight 259 lb 1.6 oz (117.5 kg), SpO2 99 %.  ECOG PERFORMANCE STATUS: 1 - Symptomatic but completely ambulatory  Physical Exam  Constitutional: Oriented to person, place, and time and well-developed, well-nourished, and in no distress. No distress.  HENT:  Head: Normocephalic and atraumatic.  Mouth/Throat: Oropharynx is clear and moist. No oropharyngeal exudate.  Eyes: Conjunctivae are normal. Right eye exhibits no discharge. Left eye exhibits no discharge. No scleral icterus.  Neck: Normal range of motion. Neck supple.  Cardiovascular: Normal rate, regular rhythm, normal heart sounds and intact distal pulses.   Pulmonary/Chest: Effort normal and breath sounds normal. No respiratory distress. No wheezes. No rales.  Abdominal: Soft. Bowel sounds are normal. Exhibits no distension and no mass. There is no tenderness.  Musculoskeletal: Normal range of motion.  Exhibits no edema.  Lymphadenopathy:    No cervical adenopathy.  Neurological: Alert and oriented to person, place, and time. Exhibits normal muscle tone. Gait normal. Coordination normal.  Skin: Skin is warm and dry. No rash noted. Not diaphoretic. No erythema. No pallor.  Psychiatric: Mood, memory and judgment normal.  Vitals reviewed.  LABORATORY DATA: Lab Results  Component Value Date   WBC 4.2 05/31/2018   HGB 10.5 (L) 05/31/2018   HCT 32.7 (L) 05/31/2018   MCV 98.2 05/31/2018   PLT 176 05/31/2018      Chemistry      Component Value Date/Time   NA 140 05/31/2018 1238   K 4.0 05/31/2018 1238   CL 102 05/31/2018 1238   CO2 28 05/31/2018 1238   BUN 34 (H) 05/31/2018 1238   CREATININE 1.58 (H) 05/31/2018 1238      Component Value Date/Time   CALCIUM 9.9 05/31/2018 1238   ALKPHOS 53 05/31/2018 1238   AST 20 05/31/2018 1238   ALT 11 05/31/2018 1238   BILITOT 0.4 05/31/2018 1238  RADIOGRAPHIC STUDIES:  No results found.   ASSESSMENT/PLAN:  Stage III squamous cell cancer This is a very pleasant 72 year old white male with a stage IIb/IIIa non-small cell lung cancer, squamous cell carcinoma diagnosed in July 2019. The patient is currently undergoing a course of concurrent chemoradiation with weekly carboplatin and paclitaxel status post 5 cycles.He tolerated the treatment well overall with the exception of mild odynophagia and neutropenia.   The patient is currently on treatment with immunotherapy with Imfinzi (Durvalumab) 10 MG/KG every 2 weeks.  Status post 1 cycle.  He tolerated the first cycle well overall with no concerning adverse effects.  Recommend for him to proceed with cycle 2 today as scheduled.  He will follow-up in 2 weeks for evaluation prior to cycle #3.  He was advised to call immediately if he has any concerning symptoms in the interval. The patient voices understanding of current disease status and treatment options and is in agreement with the  current care plan.  All questions were answered. The patient knows to call the clinic with any problems, questions or concerns. We can certainly see the patient much sooner if necessary.   No orders of the defined types were placed in this encounter.    Mikey Bussing, DNP, AGPCNP-BC, AOCNP 05/31/18

## 2018-05-31 NOTE — Patient Instructions (Signed)
[facility] Discharge Instructions for Patients Receiving Chemotherapy  Today you received the following chemotherapy agents: Imfinzi.  To help prevent nausea and vomiting after your treatment, we encourage you to take your nausea medication as directed.   If you develop nausea and vomiting that is not controlled by your nausea medication, call the clinic.   BELOW ARE SYMPTOMS THAT SHOULD BE REPORTED IMMEDIATELY:  *FEVER GREATER THAN 100.5 F  *CHILLS WITH OR WITHOUT FEVER  NAUSEA AND VOMITING THAT IS NOT CONTROLLED WITH YOUR NAUSEA MEDICATION  *UNUSUAL SHORTNESS OF BREATH  *UNUSUAL BRUISING OR BLEEDING  TENDERNESS IN MOUTH AND THROAT WITH OR WITHOUT PRESENCE OF ULCERS  *URINARY PROBLEMS  *BOWEL PROBLEMS  UNUSUAL RASH Items with * indicate a potential emergency and should be followed up as soon as possible.  Feel free to call the clinic should you have any questions or concerns. The clinic phone number is [phone].  Please show the CHEMO ALERT CARD at check-in to the Emergency Department and triage nurse.   

## 2018-05-31 NOTE — Progress Notes (Signed)
MD reviewed CBC/CMET- ok to treat despite labs.

## 2018-06-01 ENCOUNTER — Telehealth: Payer: Self-pay | Admitting: Oncology

## 2018-06-01 NOTE — Assessment & Plan Note (Addendum)
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 The patient is currently undergoing a course of concurrent chemoradiation with weekly carboplatin and paclitaxel status post 5 cycles.He tolerated the treatment well overall with the exception of mild odynophagia and neutropenia.   The patient is currently on treatment with immunotherapy with Imfinzi (Durvalumab) 10 MG/KG every 2 weeks.  Status post 1 cycle.  He tolerated the first cycle well overall with no concerning adverse effects.  Recommend for him to proceed with cycle 2 today as scheduled.  He will follow-up in 2 weeks for evaluation prior to cycle #3.  He was advised to call immediately if he has any concerning symptoms in the interval. The patient voices understanding of current disease status and treatment options and is in agreement with the current care plan.  All questions were answered. The patient knows to call the clinic with any problems, questions or concerns. We can certainly see the patient much sooner if necessary.

## 2018-06-01 NOTE — Telephone Encounter (Signed)
3 cycles already scheduled per 11/20 los - no additional appts added.

## 2018-06-09 ENCOUNTER — Telehealth: Payer: Self-pay | Admitting: *Deleted

## 2018-06-09 ENCOUNTER — Telehealth: Payer: Self-pay | Admitting: Medical Oncology

## 2018-06-09 NOTE — Telephone Encounter (Signed)
Received TC from patient's wife.  She states that her husband (pt of Dr. [name]) had to go to ED in [place], [place] on Tuesday, 06/06/18 d/t chest pain. He was thoroughly evaluated per wife including Chest CT scan. No cardiac issues found, pt eventually discharged from ED and is now home. He still has a little bit of chest discomfort but it is relieved with prn Tramadol. He also has a little trouble/pain with swallowing but it is relieved with carafate.  Pt is due to see Mikey Bussing, NP on Friday, 06/16/18 and have his Imfinzi treatment after that. The main question wife has is that pt is d/t travel on this coming Sunday and she wants to know if it is ok for her husband to travel. She states that ED doctor told them it was ok-CT scan did not reveal any specific problems. (PE, pneumonia etc). Wife wanted to check here as well for ok to travel. Discussed with Altamese Dilling, NP. She stated that as long as pt feels ok, not experiencing worsening or change in breathing, pain etc-it is ok for him to travel.  We are requesting records from [facility] in [place], [place] to have available for next Friday;s appts.  Pt aware and voices understanding that if he feels at uncomfortable he can call here or go toED.

## 2018-06-09 NOTE — Telephone Encounter (Signed)
Requested  records of visit last weekend.

## 2018-06-14 ENCOUNTER — Other Ambulatory Visit: Payer: Medicare Other

## 2018-06-14 ENCOUNTER — Ambulatory Visit: Payer: Medicare Other | Admitting: Internal Medicine

## 2018-06-14 ENCOUNTER — Ambulatory Visit: Payer: Medicare Other

## 2018-06-16 ENCOUNTER — Inpatient Hospital Stay (HOSPITAL_BASED_OUTPATIENT_CLINIC_OR_DEPARTMENT_OTHER): Payer: Medicare Other | Admitting: Oncology

## 2018-06-16 ENCOUNTER — Encounter: Payer: Self-pay | Admitting: Oncology

## 2018-06-16 ENCOUNTER — Telehealth: Payer: Self-pay | Admitting: Oncology

## 2018-06-16 ENCOUNTER — Inpatient Hospital Stay: Payer: Medicare Other

## 2018-06-16 ENCOUNTER — Inpatient Hospital Stay: Payer: Medicare Other | Attending: Internal Medicine

## 2018-06-16 VITALS — BP 136/77 | HR 72 | Temp 97.8°F | Resp 18 | Ht 68.0 in | Wt 258.7 lb

## 2018-06-16 DIAGNOSIS — R0602 Shortness of breath: Secondary | ICD-10-CM | POA: Insufficient documentation

## 2018-06-16 DIAGNOSIS — Z7984 Long term (current) use of oral hypoglycemic drugs: Secondary | ICD-10-CM | POA: Diagnosis not present

## 2018-06-16 DIAGNOSIS — C3411 Malignant neoplasm of upper lobe, right bronchus or lung: Secondary | ICD-10-CM

## 2018-06-16 DIAGNOSIS — R071 Chest pain on breathing: Secondary | ICD-10-CM | POA: Diagnosis not present

## 2018-06-16 DIAGNOSIS — E785 Hyperlipidemia, unspecified: Secondary | ICD-10-CM | POA: Diagnosis not present

## 2018-06-16 DIAGNOSIS — Z79899 Other long term (current) drug therapy: Secondary | ICD-10-CM | POA: Insufficient documentation

## 2018-06-16 DIAGNOSIS — E119 Type 2 diabetes mellitus without complications: Secondary | ICD-10-CM

## 2018-06-16 DIAGNOSIS — I129 Hypertensive chronic kidney disease with stage 1 through stage 4 chronic kidney disease, or unspecified chronic kidney disease: Secondary | ICD-10-CM | POA: Diagnosis not present

## 2018-06-16 DIAGNOSIS — R5383 Other fatigue: Secondary | ICD-10-CM | POA: Diagnosis not present

## 2018-06-16 DIAGNOSIS — N189 Chronic kidney disease, unspecified: Secondary | ICD-10-CM | POA: Insufficient documentation

## 2018-06-16 DIAGNOSIS — Z5112 Encounter for antineoplastic immunotherapy: Secondary | ICD-10-CM | POA: Diagnosis not present

## 2018-06-16 DIAGNOSIS — Z794 Long term (current) use of insulin: Secondary | ICD-10-CM | POA: Insufficient documentation

## 2018-06-16 DIAGNOSIS — R5382 Chronic fatigue, unspecified: Secondary | ICD-10-CM

## 2018-06-16 DIAGNOSIS — Z95828 Presence of other vascular implants and grafts: Secondary | ICD-10-CM

## 2018-06-16 LAB — CMP (CANCER CENTER ONLY)
ALT: 8 U/L (ref 0–44)
AST: 14 U/L — ABNORMAL LOW (ref 15–41)
Albumin: 3.2 g/dL — ABNORMAL LOW (ref 3.5–5.0)
Alkaline Phosphatase: 48 U/L (ref 38–126)
Anion gap: 11 (ref 5–15)
BUN: 43 mg/dL — ABNORMAL HIGH (ref 8–23)
CO2: 24 mmol/L (ref 22–32)
Calcium: 9.2 mg/dL (ref 8.9–10.3)
Chloride: 101 mmol/L (ref 98–111)
Creatinine: 1.63 mg/dL — ABNORMAL HIGH (ref 0.61–1.24)
GFR, Est AFR Am: 49 mL/min — ABNORMAL LOW (ref >60)
GFR, Estimated: 42 mL/min — ABNORMAL LOW (ref >60)
Glucose, Bld: 138 mg/dL — ABNORMAL HIGH (ref 70–99)
Potassium: 4.2 mmol/L (ref 3.5–5.1)
Sodium: 136 mmol/L (ref 135–145)
Total Bilirubin: 0.4 mg/dL (ref 0.3–1.2)
Total Protein: 7 g/dL (ref 6.5–8.1)

## 2018-06-16 LAB — CBC WITH DIFFERENTIAL (CANCER CENTER ONLY)
Abs Immature Granulocytes: 0.01 10*3/uL (ref 0.00–0.07)
Basophils Absolute: 0.1 10*3/uL (ref 0.0–0.1)
Basophils Relative: 1 %
Eosinophils Absolute: 0.4 10*3/uL (ref 0.0–0.5)
Eosinophils Relative: 10 %
HCT: 29.5 % — ABNORMAL LOW (ref 39.0–52.0)
Hemoglobin: 9.6 g/dL — ABNORMAL LOW (ref 13.0–17.0)
Immature Granulocytes: 0 %
Lymphocytes Relative: 15 %
Lymphs Abs: 0.6 10*3/uL — ABNORMAL LOW (ref 0.7–4.0)
MCH: 31.6 pg (ref 26.0–34.0)
MCHC: 32.5 g/dL (ref 30.0–36.0)
MCV: 97 fL (ref 80.0–100.0)
Monocytes Absolute: 0.4 10*3/uL (ref 0.1–1.0)
Monocytes Relative: 10 %
Neutro Abs: 2.5 10*3/uL (ref 1.7–7.7)
Neutrophils Relative %: 64 %
Platelet Count: 241 10*3/uL (ref 150–400)
RBC: 3.04 MIL/uL — ABNORMAL LOW (ref 4.22–5.81)
RDW: 15.3 % (ref 11.5–15.5)
WBC Count: 4 10*3/uL (ref 4.0–10.5)
nRBC: 0 % (ref 0.0–0.2)

## 2018-06-16 LAB — TSH: TSH: 2.056 u[IU]/mL (ref 0.320–4.118)

## 2018-06-16 MED ORDER — HEPARIN SOD (PORK) LOCK FLUSH 100 UNIT/ML IV SOLN
500.0000 [IU] | Freq: Once | INTRAVENOUS | Status: AC | PRN
  Administered 2018-06-16: 500 [IU]
  Filled 2018-06-16: qty 5

## 2018-06-16 MED ORDER — SODIUM CHLORIDE 0.9% FLUSH
10.0000 mL | INTRAVENOUS | Status: DC | PRN
  Administered 2018-06-16: 10 mL
  Filled 2018-06-16: qty 10

## 2018-06-16 MED ORDER — SODIUM CHLORIDE 0.9 % IV SOLN
10.0000 mg/kg | Freq: Once | INTRAVENOUS | Status: AC
  Administered 2018-06-16: 1120 mg via INTRAVENOUS
  Filled 2018-06-16: qty 20

## 2018-06-16 MED ORDER — SODIUM CHLORIDE 0.9 % IV SOLN
Freq: Once | INTRAVENOUS | Status: AC
  Administered 2018-06-16: 13:00:00 via INTRAVENOUS
  Filled 2018-06-16: qty 250

## 2018-06-16 NOTE — Assessment & Plan Note (Addendum)
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 The patient is currently undergoing a course of concurrent chemoradiation with weekly carboplatin and paclitaxel status post 5 cycles.He tolerated the treatment well overall with the exception of mild odynophagia and neutropenia. The patient is currently on treatment with immunotherapy with Imfinzi (Durvalumab) 10 MG/KG every 2 weeks.  Status post 2 cycles.  He is tolerating his treatment well overall with no concerning complaints.  Labs from today have been reviewed.  He has mild anemia.  His renal insufficiency is stable.  Recommend for him to proceed with treatment today as scheduled.  He will follow-up in 2 weeks for evaluation prior to cycle #4.  I spent time today with the patient and his wife reviewing his outside medical records.  We had a lengthy discussion about the fact that I do not think his chest pain was related to his lung cancer or his treatment with Imfinzi.    He was advised to call immediately if he has any concerning symptoms in the interval. The patient voices understanding of current disease status and treatment options and is in agreement with the current care plan.  All questions were answered. The patient knows to call the clinic with any problems, questions or concerns. We can certainly see the patient much sooner if necessary.

## 2018-06-16 NOTE — Patient Instructions (Signed)
Implanted Port Home Guide An implanted port is a type of central line that is placed under the skin. Central lines are used to provide IV access when treatment or nutrition needs to be given through a person's veins. Implanted ports are used for long-term IV access. An implanted port may be placed because:  You need IV medicine that would be irritating to the small veins in your hands or arms.  You need long-term IV medicines, such as antibiotics.  You need IV nutrition for a long period.  You need frequent blood draws for lab tests.  You need dialysis.  Implanted ports are usually placed in the chest area, but they can also be placed in the upper arm, the abdomen, or the leg. An implanted port has two main parts:  Reservoir. The reservoir is round and will appear as a small, raised area under your skin. The reservoir is the part where a needle is inserted to give medicines or draw blood.  Catheter. The catheter is a thin, flexible tube that extends from the reservoir. The catheter is placed into a large vein. Medicine that is inserted into the reservoir goes into the catheter and then into the vein.  How will I care for my incision site? Do not get the incision site wet. Bathe or shower as directed by your health care provider. How is my port accessed? Special steps must be taken to access the port:  Before the port is accessed, a numbing cream can be placed on the skin. This helps numb the skin over the port site.  Your health care provider uses a sterile technique to access the port. ? Your health care provider must put on a mask and sterile gloves. ? The skin over your port is cleaned carefully with an antiseptic and allowed to dry. ? The port is gently pinched between sterile gloves, and a needle is inserted into the port.  Only "non-coring" port needles should be used to access the port. Once the port is accessed, a blood return should be checked. This helps ensure that the port  is in the vein and is not clogged.  If your port needs to remain accessed for a constant infusion, a clear (transparent) bandage will be placed over the needle site. The bandage and needle will need to be changed every week, or as directed by your health care provider.  Keep the bandage covering the needle clean and dry. Do not get it wet. Follow your health care provider's instructions on how to take a shower or bath while the port is accessed.  If your port does not need to stay accessed, no bandage is needed over the port.  What is flushing? Flushing helps keep the port from getting clogged. Follow your health care provider's instructions on how and when to flush the port. Ports are usually flushed with saline solution or a medicine called heparin. The need for flushing will depend on how the port is used.  If the port is used for intermittent medicines or blood draws, the port will need to be flushed: ? After medicines have been given. ? After blood has been drawn. ? As part of routine maintenance.  If a constant infusion is running, the port may not need to be flushed.  How long will my port stay implanted? The port can stay in for as long as your health care provider thinks it is needed. When it is time for the port to come out, surgery will be   done to remove it. The procedure is similar to the one performed when the port was put in. When should I seek immediate medical care? When you have an implanted port, you should seek immediate medical care if:  You notice a bad smell coming from the incision site.  You have swelling, redness, or drainage at the incision site.  You have more swelling or pain at the port site or the surrounding area.  You have a fever that is not controlled with medicine.  This information is not intended to replace advice given to you by your health care provider. Make sure you discuss any questions you have with your health care provider. Document  Released: 06/28/2005 Document Revised: 12/04/2015 Document Reviewed: 03/05/2013 Elsevier Interactive Patient Education  2017 [employer]  

## 2018-06-16 NOTE — Progress Notes (Signed)
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 First dose 02/27/2018.Status post 5 cycles.  CURRENT THERAPY:Consolidationimmunotherapy with Imfinzi 10 mg/kg every 2 weeks.First dose given on 05/17/2018.  Status post 2 cycles.  INTERVAL HISTORY: Darrell [name] 72 y.o. male returns for routine follow-up visit accompanied by his wife.  The patient is feeling fine today and has no specific complaints.  Since last visit, he was traveling for work in [place] and was seen in the emergency room for chest pain.  Side hospital records were reviewed.  According to the chart, his chest pain resolved with a dose of nitroglycerin.  He also underwent a cardiac stress test which revealed low risk with normal perfusion of the LV.  The patient also had a CT angiogram to evaluate for pulmonary embolism which was negative for PE.  No other acute findings were noted.  Today the patient feels fine.  He denies fevers and chills.  Denies chest pain, cough, hemoptysis.  Ports mild shortness of breath with exertion but not at rest.  Denies nausea, vomiting, constipation, diarrhea.  Denies recent weight loss or night sweats.  The patient is here for evaluation prior to cycle #3 of Imfinzi.  MEDICAL HISTORY: Past Medical History:  Diagnosis Date  . Cellulitis of right lower extremity   . Chronic kidney disease   . Diabetes mellitus without complication ([place])   . Dyslipidemia   . Hypertension     ALLERGIES:  is allergic to lisinopril.  MEDICATIONS:  Current Outpatient Medications  Medication Sig Dispense Refill  . ASTAXANTHIN PO  Take 1 capsule by mouth daily.     . Blood Glucose Monitoring Suppl (ONE TOUCH ULTRA MINI) w/Device KIT 1 kit by Other route once.     . carvedilol (COREG) 6.25 MG tablet Take 6.25 mg by mouth 2 (two) times daily with a meal.     . Cinnamon Bark POWD Take 1,000 mg by mouth 2 (two) times daily.     . cloNIDine (CATAPRES) 0.1 MG tablet Take 0.1 mg by mouth 2 (two) times daily.     Marland Kitchen escitalopram (LEXAPRO) 10 MG tablet Take 10 mg by mouth daily.     . fenofibrate 160 MG tablet Take 160 mg by mouth daily.     . Fish Oil-Cholecalciferol (OMEGA-3 FISH OIL-VITAMIN D3) 1200-1000 MG-UNIT CAPS Take by mouth.    . Glucosamine Sulfate 1000 MG TABS Take 2,000 mg by mouth daily.     Marland Kitchen glucose blood (PRECISION QID TEST) test strip by Misc.(Non-Drug; Combo Route) route.    . hydrochlorothiazide (HYDRODIURIL) 25 MG tablet Take 25 mg by mouth daily.     . Insulin Detemir (LEVEMIR FLEXTOUCH) 100 UNIT/ML Pen Inject 10 Units into the skin daily.     . Insulin Pen Needle (FIFTY50 PEN NEEDLES) 31G X 8 MM MISC USE AS DIRECTED    . Lancets MISC by Misc.(Non-Drug; Combo Route) route.    . lidocaine-prilocaine (EMLA) cream Apply 1 application topically as needed. 30 g 0  . losartan (COZAAR) 50 MG tablet Take 50 mg by mouth daily.     . magnesium oxide (MAG-OX) 400 MG tablet Take 400 mg by mouth daily.    Marland Kitchen  metFORMIN (GLUCOPHAGE) 1000 MG tablet Take 1,000 mg by mouth 2 (two) times daily with a meal.     . Multiple Vitamin (MULTI-VITAMINS) TABS Take 1 tablet by mouth daily.     . Omega-3 1000 MG CAPS Take 1,200 mg by mouth 2 (two) times daily.     . pioglitazone (ACTOS) 45 MG tablet Take 45 mg by mouth daily.     . pravastatin (PRAVACHOL) 40 MG tablet Take 40 mg by mouth daily.     . prochlorperazine (COMPAZINE) 10 MG tablet TAKE 1 TABLET BY MOUTH EVERY 6 HOURS AS NEEDED FOR NAUSEA OR VOMITING (Patient not taking: Reported on 05/15/2018) 385 tablet 0  . traMADol (ULTRAM) 50 MG tablet Take 50 mg by mouth every 6 (six) hours  as needed for severe pain.      No current facility-administered medications for this visit.    Facility-Administered Medications Ordered in Other Visits  Medication Dose Route Frequency Provider Last Rate Last Dose  . heparin lock flush 100 unit/mL  500 Units Intracatheter Once PRN Curt Bears, MD      . sodium chloride flush (NS) 0.9 % injection 10 mL  10 mL Intracatheter PRN Curt Bears, MD        SURGICAL HISTORY:  Past Surgical History:  Procedure Laterality Date  . PORTACATH PLACEMENT Left 02/21/2018   Procedure: INSERTION PORT-A-CATH;  Surgeon: Grace Isaac, MD;  Location: [facility] OR;  Service: Thoracic;  Laterality: Left;    REVIEW OF SYSTEMS:   Review of Systems  Constitutional: Negative for appetite change, chills, fatigue, fever and unexpected weight change.  HENT:   Negative for mouth sores, nosebleeds, sore throat and trouble swallowing.   Eyes: Negative for eye problems and icterus.  Respiratory: Negative for cough, hemoptysis, and wheezing.  Positive for intermittent shortness of breath with exertion. Cardiovascular: Negative for chest pain and leg swelling.  Gastrointestinal: Negative for abdominal pain, constipation, diarrhea, nausea and vomiting.  Genitourinary: Negative for bladder incontinence, difficulty urinating, dysuria, frequency and hematuria.   Musculoskeletal: Negative for back pain, gait problem, neck pain and neck stiffness.  Skin: Negative for itching and rash.  Neurological: Negative for dizziness, extremity weakness, gait problem, headaches, light-headedness and seizures.  Hematological: Negative for adenopathy. Does not bruise/bleed easily.  Psychiatric/Behavioral: Negative for confusion, depression and sleep disturbance. The patient is not nervous/anxious.     PHYSICAL EXAMINATION:  Blood pressure 136/77, pulse 72, temperature 97.8 F (36.6 C), temperature source Oral, resp. rate 18, height _0  (1.727 m), weight 258 lb 11.2 oz (117.3  kg), SpO2 100 %.  ECOG PERFORMANCE STATUS: 1 - Symptomatic but completely ambulatory  Physical Exam  Constitutional: Oriented to person, place, and time and well-developed, well-nourished, and in no distress. No distress.  HENT:  Head: Normocephalic and atraumatic.  Mouth/Throat: Oropharynx is clear and moist. No oropharyngeal exudate.  Eyes: Conjunctivae are normal. Right eye exhibits no discharge. Left eye exhibits no discharge. No scleral icterus.  Neck: Normal range of motion. Neck supple.  Cardiovascular: Normal rate, regular rhythm, normal heart sounds and intact distal pulses.   Pulmonary/Chest: Effort normal and breath sounds normal. No respiratory distress. No wheezes. No rales.  Abdominal: Soft. Bowel sounds are normal. Exhibits no distension and no mass. There is no tenderness.  Musculoskeletal: Normal range of motion. Exhibits no edema.  Lymphadenopathy:    No cervical adenopathy.  Neurological: Alert and oriented to person, place, and time. Exhibits normal muscle tone. Gait normal. Coordination normal.  Skin: Skin is  warm and dry. No rash noted. Not diaphoretic. No erythema. No pallor.  Psychiatric: Mood, memory and judgment normal.  Vitals reviewed.  LABORATORY DATA: Lab Results  Component Value Date   WBC 4.0 06/16/2018   HGB 9.6 (L) 06/16/2018   HCT 29.5 (L) 06/16/2018   MCV 97.0 06/16/2018   PLT 241 06/16/2018      Chemistry      Component Value Date/Time   NA 136 06/16/2018 1047   K 4.2 06/16/2018 1047   CL 101 06/16/2018 1047   CO2 24 06/16/2018 1047   BUN 43 (H) 06/16/2018 1047   CREATININE 1.63 (H) 06/16/2018 1047      Component Value Date/Time   CALCIUM 9.2 06/16/2018 1047   ALKPHOS 48 06/16/2018 1047   AST 14 (L) 06/16/2018 1047   ALT 8 06/16/2018 1047   BILITOT 0.4 06/16/2018 1047       RADIOGRAPHIC STUDIES:  No results found.   ASSESSMENT/PLAN:  Stage III squamous cell cancer This is a very pleasant 72 year old white male with a  stage IIb/IIIa non-small cell lung cancer, squamous cell carcinoma diagnosed in July 2019. The patient is currently undergoing a course of concurrent chemoradiation with weekly carboplatin and paclitaxel status post 5 cycles.He tolerated the treatment well overall with the exception of mild odynophagia and neutropenia. The patient is currently on treatment with immunotherapy with Imfinzi (Durvalumab) 10 MG/KG every 2 weeks.  Status post 2 cycles.  He is tolerating his treatment well overall with no concerning complaints.  Labs from today have been reviewed.  He has mild anemia.  His renal insufficiency is stable.  Recommend for him to proceed with treatment today as scheduled.  He will follow-up in 2 weeks for evaluation prior to cycle #4.  I spent time today with the patient and his wife reviewing his outside medical records.  We had a lengthy discussion about the fact that I do not think his chest pain was related to his lung cancer or his treatment with Imfinzi.    He was advised to call immediately if he has any concerning symptoms in the interval. The patient voices understanding of current disease status and treatment options and is in agreement with the current care plan.  All questions were answered. The patient knows to call the clinic with any problems, questions or concerns. We can certainly see the patient much sooner if necessary.   No orders of the defined types were placed in this encounter.    Mikey Bussing, DNP, AGPCNP-BC, AOCNP 06/16/18

## 2018-06-16 NOTE — Telephone Encounter (Signed)
3 cycles already scheduled per 12/6 los.  No additional appts added.

## 2018-06-16 NOTE — Patient Instructions (Signed)
[facility] Discharge Instructions for Patients Receiving Chemotherapy  Today you received the following chemotherapy agents: Imfinzi.  To help prevent nausea and vomiting after your treatment, we encourage you to take your nausea medication as directed.   If you develop nausea and vomiting that is not controlled by your nausea medication, call the clinic.   BELOW ARE SYMPTOMS THAT SHOULD BE REPORTED IMMEDIATELY:  *FEVER GREATER THAN 100.5 F  *CHILLS WITH OR WITHOUT FEVER  NAUSEA AND VOMITING THAT IS NOT CONTROLLED WITH YOUR NAUSEA MEDICATION  *UNUSUAL SHORTNESS OF BREATH  *UNUSUAL BRUISING OR BLEEDING  TENDERNESS IN MOUTH AND THROAT WITH OR WITHOUT PRESENCE OF ULCERS  *URINARY PROBLEMS  *BOWEL PROBLEMS  UNUSUAL RASH Items with * indicate a potential emergency and should be followed up as soon as possible.  Feel free to call the clinic should you have any questions or concerns. The clinic phone number is [phone].  Please show the CHEMO ALERT CARD at check-in to the Emergency Department and triage nurse.   

## 2018-06-21 ENCOUNTER — Inpatient Hospital Stay (HOSPITAL_BASED_OUTPATIENT_CLINIC_OR_DEPARTMENT_OTHER): Payer: Medicare Other | Admitting: Medical

## 2018-06-21 ENCOUNTER — Inpatient Hospital Stay: Payer: Medicare Other

## 2018-06-21 ENCOUNTER — Telehealth: Payer: Self-pay | Admitting: *Deleted

## 2018-06-21 VITALS — BP 142/86 | HR 90 | Temp 98.2°F | Resp 20 | Ht 68.0 in | Wt 258.8 lb

## 2018-06-21 DIAGNOSIS — Z95828 Presence of other vascular implants and grafts: Secondary | ICD-10-CM

## 2018-06-21 DIAGNOSIS — R071 Chest pain on breathing: Secondary | ICD-10-CM

## 2018-06-21 DIAGNOSIS — C3411 Malignant neoplasm of upper lobe, right bronchus or lung: Secondary | ICD-10-CM

## 2018-06-21 DIAGNOSIS — Z79899 Other long term (current) drug therapy: Secondary | ICD-10-CM

## 2018-06-21 DIAGNOSIS — Z5112 Encounter for antineoplastic immunotherapy: Secondary | ICD-10-CM | POA: Diagnosis not present

## 2018-06-21 DIAGNOSIS — N189 Chronic kidney disease, unspecified: Secondary | ICD-10-CM

## 2018-06-21 DIAGNOSIS — N289 Disorder of kidney and ureter, unspecified: Secondary | ICD-10-CM | POA: Diagnosis not present

## 2018-06-21 DIAGNOSIS — Z7984 Long term (current) use of oral hypoglycemic drugs: Secondary | ICD-10-CM

## 2018-06-21 DIAGNOSIS — I129 Hypertensive chronic kidney disease with stage 1 through stage 4 chronic kidney disease, or unspecified chronic kidney disease: Secondary | ICD-10-CM

## 2018-06-21 DIAGNOSIS — Z794 Long term (current) use of insulin: Secondary | ICD-10-CM

## 2018-06-21 DIAGNOSIS — R0602 Shortness of breath: Secondary | ICD-10-CM

## 2018-06-21 DIAGNOSIS — E785 Hyperlipidemia, unspecified: Secondary | ICD-10-CM

## 2018-06-21 DIAGNOSIS — E119 Type 2 diabetes mellitus without complications: Secondary | ICD-10-CM

## 2018-06-21 LAB — CBC WITH DIFFERENTIAL (CANCER CENTER ONLY)
Abs Immature Granulocytes: 0.03 10*3/uL (ref 0.00–0.07)
Basophils Absolute: 0 10*3/uL (ref 0.0–0.1)
Basophils Relative: 1 %
Eosinophils Absolute: 0.2 10*3/uL (ref 0.0–0.5)
Eosinophils Relative: 3 %
HCT: 29 % — ABNORMAL LOW (ref 39.0–52.0)
Hemoglobin: 9.5 g/dL — ABNORMAL LOW (ref 13.0–17.0)
Immature Granulocytes: 1 %
Lymphocytes Relative: 7 %
Lymphs Abs: 0.5 10*3/uL — ABNORMAL LOW (ref 0.7–4.0)
MCH: 31.7 pg (ref 26.0–34.0)
MCHC: 32.8 g/dL (ref 30.0–36.0)
MCV: 96.7 fL (ref 80.0–100.0)
Monocytes Absolute: 0.4 10*3/uL (ref 0.1–1.0)
Monocytes Relative: 7 %
Neutro Abs: 5.2 10*3/uL (ref 1.7–7.7)
Neutrophils Relative %: 81 %
Platelet Count: 233 10*3/uL (ref 150–400)
RBC: 3 MIL/uL — ABNORMAL LOW (ref 4.22–5.81)
RDW: 14.7 % (ref 11.5–15.5)
WBC Count: 6.3 10*3/uL (ref 4.0–10.5)
nRBC: 0 % (ref 0.0–0.2)

## 2018-06-21 LAB — CMP (CANCER CENTER ONLY)
ALT: 9 U/L (ref 0–44)
AST: 15 U/L (ref 15–41)
Albumin: 3.1 g/dL — ABNORMAL LOW (ref 3.5–5.0)
Alkaline Phosphatase: 48 U/L (ref 38–126)
Anion gap: 13 (ref 5–15)
BUN: 46 mg/dL — ABNORMAL HIGH (ref 8–23)
CO2: 24 mmol/L (ref 22–32)
Calcium: 9.4 mg/dL (ref 8.9–10.3)
Chloride: 98 mmol/L (ref 98–111)
Creatinine: 2.36 mg/dL — ABNORMAL HIGH (ref 0.61–1.24)
GFR, Est AFR Am: 31 mL/min — ABNORMAL LOW (ref >60)
GFR, Estimated: 27 mL/min — ABNORMAL LOW (ref >60)
Glucose, Bld: 145 mg/dL — ABNORMAL HIGH (ref 70–99)
Potassium: 3.7 mmol/L (ref 3.5–5.1)
Sodium: 135 mmol/L (ref 135–145)
Total Bilirubin: 0.6 mg/dL (ref 0.3–1.2)
Total Protein: 7.1 g/dL (ref 6.5–8.1)

## 2018-06-21 MED ORDER — HEPARIN SOD (PORK) LOCK FLUSH 100 UNIT/ML IV SOLN
500.0000 [IU] | Freq: Once | INTRAVENOUS | Status: AC | PRN
  Administered 2018-06-21: 500 [IU]
  Filled 2018-06-21: qty 5

## 2018-06-21 MED ORDER — SODIUM CHLORIDE 0.9 % IV SOLN
INTRAVENOUS | Status: DC
  Administered 2018-06-21: 11:00:00 via INTRAVENOUS
  Filled 2018-06-21 (×2): qty 250

## 2018-06-21 MED ORDER — SODIUM CHLORIDE 0.9% FLUSH
10.0000 mL | INTRAVENOUS | Status: DC | PRN
  Administered 2018-06-21: 10 mL
  Filled 2018-06-21: qty 10

## 2018-06-21 NOTE — Telephone Encounter (Signed)
Verbal order received and read back from Dr. [name] for Darrell [name] to be seen today in [facility].  Scheduling message sent.  Order given to [facility] to bring Darrell [name] in as soon as possible for evaluation at this time.  "Darrell [name]'s wife Darrell [name].  Darrell [name] has been experiencing symptoms of chest pain and severe shortness of breath since yesterday.  This happened after the last Imfinzi treatment.  ED in [place] cleared him of any cardiac problems a few weeks ago.  Darrell [name] said to call if symptoms persist.  Today,Tramadol helped chest pain but shortness of breath is worse.  Has a dry cough as if clearing throat.  Unable to answer questions with conversations.  This morning's shower exhausted him.  As long as he's still or lying down, he's okay. Sleeps up on pillows.   He is pale, no fever.  What do we need to do or does he need to be seen?"

## 2018-06-21 NOTE — Patient Instructions (Signed)
Implanted Port Home Guide An implanted port is a type of central line that is placed under the skin. Central lines are used to provide IV access when treatment or nutrition needs to be given through a person's veins. Implanted ports are used for long-term IV access. An implanted port may be placed because:  You need IV medicine that would be irritating to the small veins in your hands or arms.  You need long-term IV medicines, such as antibiotics.  You need IV nutrition for a long period.  You need frequent blood draws for lab tests.  You need dialysis.  Implanted ports are usually placed in the chest area, but they can also be placed in the upper arm, the abdomen, or the leg. An implanted port has two main parts:  Reservoir. The reservoir is round and will appear as a small, raised area under your skin. The reservoir is the part where a needle is inserted to give medicines or draw blood.  Catheter. The catheter is a thin, flexible tube that extends from the reservoir. The catheter is placed into a large vein. Medicine that is inserted into the reservoir goes into the catheter and then into the vein.  How will I care for my incision site? Do not get the incision site wet. Bathe or shower as directed by your health care provider. How is my port accessed? Special steps must be taken to access the port:  Before the port is accessed, a numbing cream can be placed on the skin. This helps numb the skin over the port site.  Your health care provider uses a sterile technique to access the port. ? Your health care provider must put on a mask and sterile gloves. ? The skin over your port is cleaned carefully with an antiseptic and allowed to dry. ? The port is gently pinched between sterile gloves, and a needle is inserted into the port.  Only "non-coring" port needles should be used to access the port. Once the port is accessed, a blood return should be checked. This helps ensure that the port  is in the vein and is not clogged.  If your port needs to remain accessed for a constant infusion, a clear (transparent) bandage will be placed over the needle site. The bandage and needle will need to be changed every week, or as directed by your health care provider.  Keep the bandage covering the needle clean and dry. Do not get it wet. Follow your health care provider's instructions on how to take a shower or bath while the port is accessed.  If your port does not need to stay accessed, no bandage is needed over the port.  What is flushing? Flushing helps keep the port from getting clogged. Follow your health care provider's instructions on how and when to flush the port. Ports are usually flushed with saline solution or a medicine called heparin. The need for flushing will depend on how the port is used.  If the port is used for intermittent medicines or blood draws, the port will need to be flushed: ? After medicines have been given. ? After blood has been drawn. ? As part of routine maintenance.  If a constant infusion is running, the port may not need to be flushed.  How long will my port stay implanted? The port can stay in for as long as your health care provider thinks it is needed. When it is time for the port to come out, surgery will be   done to remove it. The procedure is similar to the one performed when the port was put in. When should I seek immediate medical care? When you have an implanted port, you should seek immediate medical care if:  You notice a bad smell coming from the incision site.  You have swelling, redness, or drainage at the incision site.  You have more swelling or pain at the port site or the surrounding area.  You have a fever that is not controlled with medicine.  This information is not intended to replace advice given to you by your health care provider. Make sure you discuss any questions you have with your health care provider. Document  Released: 06/28/2005 Document Revised: 12/04/2015 Document Reviewed: 03/05/2013 Elsevier Interactive Patient Education  2017 [employer]  

## 2018-06-21 NOTE — Progress Notes (Signed)
Pt reports improvement in fatigue after 1h IVF.  Tolerated snacks.

## 2018-06-22 ENCOUNTER — Telehealth: Payer: Self-pay | Admitting: Medical Oncology

## 2018-06-22 NOTE — Telephone Encounter (Signed)
Asking for Dr.  [name] to refill Tramadol. I called pharmacy and LM to call me about who prescribed Tramadol.

## 2018-06-22 NOTE — Telephone Encounter (Signed)
Requests Tramdol refill-stated prescriber is no longer practicing in [place] .ASking for [facility] to refill for his chest pain .  I instructed pt ( Via VM ) to contact the office of his PCP for refill.

## 2018-06-22 NOTE — Progress Notes (Signed)
[facility] Progress Note   [name] [id number] 1945/10/01 72 y.o.  Darrell [name] is managed by Dr. Eilleen Kempf  Actively treated with chemotherapy/immunotherapy/hormonal therapy: yes  Current Therapy: Imfinzi  Last Treated: 06/16/18 (cycle 3 Day 1)  Assessment: Plan:    Renal insufficiency - Plan: 0.9 %  sodium chloride infusion  Chest pain on breathing  Shortness of breath  Stage III squamous cell cancer   1) Renal insufficiency/dehydration: A CMP returned today with creatinine 2.36 which was up from 1/63 at his last visit.  A BUN returned at 46.  He was given 1 L NS IV today.  He was instructed to push fluids orally.  He is scheduled for repeat labs on 06/28/18.  2) SOB/CP:  I attempted to reassure the patient that his symptoms could be associated with his Imfinzi dose given that they occur after his treatments.  I reiterated that his recent ER evaluation was negative for cardiopathy.  3) Stage IIb/IIIa non small cell lung cancer: The patient is s/p cycle 3 of his imfinzi.  His next treatment is due 06/28/18.  He will see Dr. [name] on 07/10/18.  Please see After Visit Summary for patient specific instructions.  Future Appointments  Date Time Provider [place]  06/28/2018 11:45 AM [facility] LAB 3 [facility] None  06/28/2018 12:00 PM [facility] INFUSION [facility] None  06/28/2018 12:15 PM Curt Bears, MD [facility] None  06/28/2018  1:30 PM [facility] INFUSION [facility] None  07/10/2018  9:00 AM [facility] LAB 4 [facility] None  07/10/2018  9:15 AM [facility] [place] FLUSH [facility] None  07/10/2018  9:45 AM Curt Bears, MD [facility] None  07/10/2018 10:45 AM [facility] INFUSION [facility] None  07/24/2018  7:45 AM [facility] LAB 5 [facility] None  07/24/2018  8:00 AM [facility] [place] FLUSH [facility] None  07/24/2018  8:30 AM Curt Bears, MD [facility] None  07/24/2018  9:30 AM [facility] INFUSION  [facility] None  08/07/2018  8:15 AM [facility] [place] FLUSH [facility] None  08/07/2018  8:30 AM Curcio, Roselie Awkward, NP [facility] None  08/07/2018  9:15 AM [facility] INFUSION [facility] None    No orders of the defined types were placed in this encounter.      Subjective:   Patient ID:  Darrell [name] is a 72 y.o. (DOB 10-03-1945) male.  Chief Complaint:  Chief Complaint  Patient presents with  . Shortness of Breath    HPI Darrell [name] is a 72 y.o. male with a stage IIb/IIIa non small cell lung cancer, squamous cell who is s/p cycle 3 day 1 of imfinzi which was dosed on 06/16/18.  He presents today with a report of SOB and chest pain with deep breaths after his last chemotherapy.  This occurred previously after treatment.  He was seen previously at an ER in [place] for these symptoms after chemotherapy.  No cardiac issues of concern were identified.  He reports fatigue, anoerxia, and increased sleepiness.  He has no edema.  Medications: I have reviewed the patient's current medications.  Allergies:  Allergies  Allergen Reactions  . Lisinopril Cough         Past Medical History:  Diagnosis Date  . Cellulitis of right lower extremity   . Chronic kidney disease   . Diabetes mellitus without complication ([place])   . Dyslipidemia   . Hypertension     Past Surgical History:  Procedure Laterality Date  . PORTACATH PLACEMENT Left 02/21/2018   Procedure: INSERTION PORT-A-CATH;  Surgeon: Servando Snare,  Lilia Argue, MD;  Location: [facility] OR;  Service: Thoracic;  Laterality: Left;    Family History  Problem Relation Age of Onset  . Breast cancer Mother   . CAD Father     Social History   Socioeconomic History  . Marital status: Married    Spouse name: Not on file  . Number of children: Not on file  . Years of education: Not on file  . Highest education level: Not on file  Occupational History  . Not on file  Social Needs  . Financial resource strain: Not on file  . Food  insecurity:    Worry: Not on file    Inability: Not on file  . Transportation needs:    Medical: Not on file    Non-medical: Not on file  Tobacco Use  . Smoking status: Former Smoker    Packs/day: 1.50    Years: 20.00    Pack years: 30.00    Types: Cigarettes    Last attempt to quit: 1988    Years since quitting: 31.9  . Smokeless tobacco: Never Used  Substance and Sexual Activity  . Alcohol use: Yes    Comment: occasionally  . Drug use: Never  . Sexual activity: Not Currently  Lifestyle  . Physical activity:    Days per week: Not on file    Minutes per session: Not on file  . Stress: Not on file  Relationships  . Social connections:    Talks on phone: Not on file    Gets together: Not on file    Attends religious service: Not on file    Active member of club or organization: Not on file    Attends meetings of clubs or organizations: Not on file    Relationship status: Not on file  . Intimate partner violence:    Fear of current or ex partner: Not on file    Emotionally abused: Not on file    Physically abused: Not on file    Forced sexual activity: Not on file  Other Topics Concern  . Not on file  Social History Narrative   05-15-18 Unable to ask abuse questions wife with him today    Past Medical History, Surgical history, Social history, and Family history were reviewed and updated as appropriate.   Please see review of systems for further details on the patient's review from today.   Review of Systems:  Review of Systems  Constitutional: Positive for appetite change. Negative for chills, diaphoresis and fever.  HENT: Negative for trouble swallowing and voice change.   Respiratory: Positive for shortness of breath. Negative for cough, chest tightness and wheezing.   Cardiovascular: Positive for chest pain. Negative for palpitations and leg swelling.  Gastrointestinal: Negative for abdominal pain, constipation, diarrhea, nausea and vomiting.  Musculoskeletal:  Negative for back pain and myalgias.  Neurological: Negative for dizziness, light-headedness and headaches.    Objective:   Physical Exam:  There were no vitals taken for this visit. ECOG: 0  Physical Exam Constitutional:      General: He is not in acute distress.    Appearance: He is not diaphoretic.  HENT:     Head: Normocephalic and atraumatic.  Cardiovascular:     Rate and Rhythm: Normal rate and regular rhythm.     Heart sounds: Normal heart sounds. No murmur. No friction rub. No gallop.      Comments: No presacral edema  Pulmonary:     Effort: Pulmonary effort is normal. No  respiratory distress.     Breath sounds: Normal breath sounds. No wheezing or rales.  Musculoskeletal:     Right lower leg: No edema.     Left lower leg: No edema.  Skin:    General: Skin is warm and dry.     Findings: No erythema or rash.  Neurological:     General: No focal deficit present.     Mental Status: He is alert.     Lab Review:     Component Value Date/Time   NA 135 06/21/2018 1030   K 3.7 06/21/2018 1030   CL 98 06/21/2018 1030   CO2 24 06/21/2018 1030   GLUCOSE 145 (H) 06/21/2018 1030   GLUCOSE 102 (H) 06/06/2006 0946   BUN 46 (H) 06/21/2018 1030   CREATININE 2.36 (H) 06/21/2018 1030   CALCIUM 9.4 06/21/2018 1030   PROT 7.1 06/21/2018 1030   ALBUMIN 3.1 (L) 06/21/2018 1030   AST 15 06/21/2018 1030   ALT 9 06/21/2018 1030   ALKPHOS 48 06/21/2018 1030   BILITOT 0.6 06/21/2018 1030   GFRNONAA 27 (L) 06/21/2018 1030   GFRAA 31 (L) 06/21/2018 1030       Component Value Date/Time   WBC 6.3 06/21/2018 1030   WBC 4.2 02/21/2018 1120   RBC 3.00 (L) 06/21/2018 1030   HGB 9.5 (L) 06/21/2018 1030   HCT 29.0 (L) 06/21/2018 1030   PLT 233 06/21/2018 1030   MCV 96.7 06/21/2018 1030   MCH 31.7 06/21/2018 1030   MCHC 32.8 06/21/2018 1030   RDW 14.7 06/21/2018 1030   LYMPHSABS 0.5 (L) 06/21/2018 1030   MONOABS 0.4 06/21/2018 1030   EOSABS 0.2 06/21/2018 1030   BASOSABS 0.0  06/21/2018 1030   -------------------------------  Imaging from last 24 hours (if applicable):  Radiology interpretation: No results found.      This case was discussed with Dr. [name]. He expressed agreement with my management of this patient.

## 2018-06-23 ENCOUNTER — Telehealth: Payer: Self-pay | Admitting: Medical Oncology

## 2018-06-23 ENCOUNTER — Other Ambulatory Visit: Payer: Self-pay | Admitting: Medical Oncology

## 2018-06-23 DIAGNOSIS — C3411 Malignant neoplasm of upper lobe, right bronchus or lung: Secondary | ICD-10-CM

## 2018-06-23 MED ORDER — TRAMADOL HCL 50 MG PO TABS: 50.0000 mg | ORAL_TABLET | Freq: Four times a day (QID) | ORAL | 0 refills | Status: DC | PRN

## 2018-06-23 NOTE — Telephone Encounter (Signed)
F/U chest pain- Returned pt call. I told him that [name] wants to evaluate his pain. Pt describes his pain as "under my sternum" and he reports SOB. He is taking  tramadol that was " filled in 2016" and he takes 2 in am and 2 in pm and this helps his pain. Pt on Imfinizi. Per [name] he refilled Tramadol #20 tablets and pt instructed to go to ED for worsening symptoms and to keep appt next week. Pt voiced understanding.

## 2018-06-28 ENCOUNTER — Encounter: Payer: Self-pay | Admitting: Internal Medicine

## 2018-06-28 ENCOUNTER — Inpatient Hospital Stay: Payer: Medicare Other

## 2018-06-28 ENCOUNTER — Inpatient Hospital Stay (HOSPITAL_BASED_OUTPATIENT_CLINIC_OR_DEPARTMENT_OTHER): Payer: Medicare Other | Admitting: Internal Medicine

## 2018-06-28 VITALS — BP 175/86 | HR 93 | Temp 97.8°F | Resp 18 | Ht 68.0 in | Wt 252.1 lb

## 2018-06-28 DIAGNOSIS — R0602 Shortness of breath: Secondary | ICD-10-CM | POA: Diagnosis not present

## 2018-06-28 DIAGNOSIS — R071 Chest pain on breathing: Secondary | ICD-10-CM | POA: Diagnosis not present

## 2018-06-28 DIAGNOSIS — C3411 Malignant neoplasm of upper lobe, right bronchus or lung: Secondary | ICD-10-CM

## 2018-06-28 DIAGNOSIS — Z95828 Presence of other vascular implants and grafts: Secondary | ICD-10-CM

## 2018-06-28 DIAGNOSIS — I10 Essential (primary) hypertension: Secondary | ICD-10-CM

## 2018-06-28 DIAGNOSIS — E785 Hyperlipidemia, unspecified: Secondary | ICD-10-CM

## 2018-06-28 DIAGNOSIS — Z7984 Long term (current) use of oral hypoglycemic drugs: Secondary | ICD-10-CM

## 2018-06-28 DIAGNOSIS — Z794 Long term (current) use of insulin: Secondary | ICD-10-CM

## 2018-06-28 DIAGNOSIS — N189 Chronic kidney disease, unspecified: Secondary | ICD-10-CM

## 2018-06-28 DIAGNOSIS — Z5112 Encounter for antineoplastic immunotherapy: Secondary | ICD-10-CM

## 2018-06-28 DIAGNOSIS — E119 Type 2 diabetes mellitus without complications: Secondary | ICD-10-CM

## 2018-06-28 DIAGNOSIS — Z79899 Other long term (current) drug therapy: Secondary | ICD-10-CM

## 2018-06-28 DIAGNOSIS — I129 Hypertensive chronic kidney disease with stage 1 through stage 4 chronic kidney disease, or unspecified chronic kidney disease: Secondary | ICD-10-CM

## 2018-06-28 LAB — CMP (CANCER CENTER ONLY)
ALT: 11 U/L (ref 0–44)
AST: 14 U/L — ABNORMAL LOW (ref 15–41)
Albumin: 3.2 g/dL — ABNORMAL LOW (ref 3.5–5.0)
Alkaline Phosphatase: 49 U/L (ref 38–126)
Anion gap: 9 (ref 5–15)
BUN: 29 mg/dL — ABNORMAL HIGH (ref 8–23)
CO2: 30 mmol/L (ref 22–32)
Calcium: 9.7 mg/dL (ref 8.9–10.3)
Chloride: 100 mmol/L (ref 98–111)
Creatinine: 1.62 mg/dL — ABNORMAL HIGH (ref 0.61–1.24)
GFR, Est AFR Am: 49 mL/min — ABNORMAL LOW (ref >60)
GFR, Estimated: 43 mL/min — ABNORMAL LOW (ref >60)
Glucose, Bld: 109 mg/dL — ABNORMAL HIGH (ref 70–99)
Potassium: 3.5 mmol/L (ref 3.5–5.1)
Sodium: 139 mmol/L (ref 135–145)
Total Bilirubin: 0.5 mg/dL (ref 0.3–1.2)
Total Protein: 7.2 g/dL (ref 6.5–8.1)

## 2018-06-28 LAB — CBC WITH DIFFERENTIAL (CANCER CENTER ONLY)
Abs Immature Granulocytes: 0.01 10*3/uL (ref 0.00–0.07)
Basophils Absolute: 0 10*3/uL (ref 0.0–0.1)
Basophils Relative: 1 %
Eosinophils Absolute: 0.5 10*3/uL (ref 0.0–0.5)
Eosinophils Relative: 12 %
HCT: 31.5 % — ABNORMAL LOW (ref 39.0–52.0)
Hemoglobin: 10.3 g/dL — ABNORMAL LOW (ref 13.0–17.0)
Immature Granulocytes: 0 %
Lymphocytes Relative: 15 %
Lymphs Abs: 0.6 10*3/uL — ABNORMAL LOW (ref 0.7–4.0)
MCH: 31.4 pg (ref 26.0–34.0)
MCHC: 32.7 g/dL (ref 30.0–36.0)
MCV: 96 fL (ref 80.0–100.0)
Monocytes Absolute: 0.4 10*3/uL (ref 0.1–1.0)
Monocytes Relative: 9 %
Neutro Abs: 2.7 10*3/uL (ref 1.7–7.7)
Neutrophils Relative %: 63 %
Platelet Count: 257 10*3/uL (ref 150–400)
RBC: 3.28 MIL/uL — ABNORMAL LOW (ref 4.22–5.81)
RDW: 14.6 % (ref 11.5–15.5)
WBC Count: 4.3 10*3/uL (ref 4.0–10.5)
nRBC: 0 % (ref 0.0–0.2)

## 2018-06-28 MED ORDER — SODIUM CHLORIDE 0.9% FLUSH
10.0000 mL | INTRAVENOUS | Status: DC | PRN
  Administered 2018-06-28: 10 mL
  Filled 2018-06-28: qty 10

## 2018-06-28 MED ORDER — SODIUM CHLORIDE 0.9% FLUSH
10.0000 mL | INTRAVENOUS | Status: AC | PRN
  Administered 2018-06-28: 10 mL
  Filled 2018-06-28: qty 10

## 2018-06-28 MED ORDER — HEPARIN SOD (PORK) LOCK FLUSH 100 UNIT/ML IV SOLN
500.0000 [IU] | Freq: Once | INTRAVENOUS | Status: AC | PRN
  Administered 2018-06-28: 500 [IU]
  Filled 2018-06-28: qty 5

## 2018-06-28 NOTE — Progress Notes (Signed)
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 First dose 02/27/2018.Status post 5 cycles.  CURRENT THERAPY:Consolidationimmunotherapy with Imfinzi 10 mg/kg every 2 weeks.First dose given on 05/17/2018.Status post 3 cycles.  INTERVAL HISTORY: Darrell [name] 72 y.o. male returns to the clinic today for follow-up visit accompanied by his wife.  The patient has been complaining of shortness of breath for the last few weeks.  He mentions that he tolerated the first cycle of his treatment with Imfinzi well with no concerning complaints but after the second and third cycle he had several days of significant shortness of breath but it is much better now.  He also had an episode of chest pain and he was seen at an emergency department in [place].  CT angiogram scan of the chest was performed at that time and that showed no evidence for pulmonary embolism.  He had cardiac work-up that was also unremarkable.  The patient took some old tramadol tablets and he had improvement in his condition.  He requested refill of his medication which was given to him last week.  He is here today for evaluation before starting cycle #4.  He denied having any nausea, vomiting, diarrhea or constipation.  He denied having any headache or visual changes.  He has no fever or chills.  He has no current chest pain, cough or hemoptysis.    MEDICAL HISTORY: Past Medical History:  Diagnosis Date  . Cellulitis of right lower extremity   . Chronic kidney disease   . Diabetes  mellitus without complication ([place])   . Dyslipidemia   . Hypertension     ALLERGIES:  is allergic to lisinopril.  MEDICATIONS:  Current Outpatient Medications  Medication Sig Dispense Refill  . ASTAXANTHIN PO Take 1 capsule by mouth daily.     . Blood Glucose Monitoring Suppl (ONE TOUCH ULTRA MINI) w/Device KIT 1 kit by Other route once.     . carvedilol (COREG) 6.25 MG tablet Take 6.25 mg by mouth 2 (two) times daily with a meal.     . Cinnamon Bark POWD Take 1,000 mg by mouth 2 (two) times daily.     . cloNIDine (CATAPRES) 0.1 MG tablet Take 0.1 mg by mouth 2 (two) times daily.     Marland Kitchen escitalopram (LEXAPRO) 10 MG tablet Take 10 mg by mouth daily.     . fenofibrate 160 MG tablet Take 160 mg by mouth daily.     . Fish Oil-Cholecalciferol (OMEGA-3 FISH OIL-VITAMIN D3) 1200-1000 MG-UNIT CAPS Take by mouth.    . Glucosamine Sulfate 1000 MG TABS Take 2,000 mg by mouth daily.     Marland Kitchen glucose blood (PRECISION QID TEST) test strip by Misc.(Non-Drug; Combo Route) route.    . hydrochlorothiazide (HYDRODIURIL) 25 MG tablet Take 25 mg by mouth daily.     . Insulin Detemir (LEVEMIR FLEXTOUCH) 100 UNIT/ML Pen Inject 10 Units into the skin daily.     . Insulin Pen Needle (FIFTY50 PEN NEEDLES) 31G X 8 MM MISC USE AS DIRECTED    . Lancets MISC by Misc.(Non-Drug; Combo Route) route.    . lidocaine-prilocaine (  EMLA) cream Apply 1 application topically as needed. 30 g 0  . losartan (COZAAR) 50 MG tablet Take 50 mg by mouth daily.     . magnesium oxide (MAG-OX) 400 MG tablet Take 400 mg by mouth daily.    . metFORMIN (GLUCOPHAGE) 1000 MG tablet Take 1,000 mg by mouth 2 (two) times daily with a meal.     . Multiple Vitamin (MULTI-VITAMINS) TABS Take 1 tablet by mouth daily.     . Omega-3 1000 MG CAPS Take 1,200 mg by mouth 2 (two) times daily.     . pioglitazone (ACTOS) 45 MG tablet Take 45 mg by mouth daily.     . pravastatin (PRAVACHOL) 40 MG tablet Take 40 mg by mouth daily.     . prochlorperazine  (COMPAZINE) 10 MG tablet TAKE 1 TABLET BY MOUTH EVERY 6 HOURS AS NEEDED FOR NAUSEA OR VOMITING 385 tablet 0  . traMADol (ULTRAM) 50 MG tablet Take 1 tablet (50 mg total) by mouth every 6 (six) hours as needed for severe pain. 20 tablet 0   No current facility-administered medications for this visit.     SURGICAL HISTORY:  Past Surgical History:  Procedure Laterality Date  . PORTACATH PLACEMENT Left 02/21/2018   Procedure: INSERTION PORT-A-CATH;  Surgeon: Grace Isaac, MD;  Location: [facility] OR;  Service: Thoracic;  Laterality: Left;    REVIEW OF SYSTEMS:  Constitutional: positive for fatigue Eyes: negative Ears, nose, mouth, throat, and face: negative Respiratory: positive for dyspnea on exertion Cardiovascular: negative Gastrointestinal: negative Genitourinary:negative Integument/breast: negative Hematologic/lymphatic: negative Musculoskeletal:negative Neurological: negative Behavioral/Psych: negative Endocrine: negative Allergic/Immunologic: negative   PHYSICAL EXAMINATION: General appearance: alert, cooperative and no distress Head: Normocephalic, without obvious abnormality, atraumatic Neck: no adenopathy, no JVD, supple, symmetrical, trachea midline and thyroid not enlarged, symmetric, no tenderness/mass/nodules Lymph nodes: Cervical, supraclavicular, and axillary nodes normal. Resp: clear to auscultation bilaterally Back: symmetric, no curvature. ROM normal. No CVA tenderness. Cardio: regular rate and rhythm, S1, S2 normal, no murmur, click, rub or gallop GI: soft, non-tender; bowel sounds normal; no masses,  no organomegaly Extremities: extremities normal, atraumatic, no cyanosis or edema Neurologic: Alert and oriented X 3, normal strength and tone. Normal symmetric reflexes. Normal coordination and gait  ECOG PERFORMANCE STATUS: 1 - Symptomatic but completely ambulatory  Blood pressure (!) 175/86, pulse 93, temperature 97.8 F (36.6 C), temperature source Oral, resp.  rate 18, height '5\' 8"'$  (1.727 m), weight 252 lb 1.6 oz (114.4 kg), SpO2 98 %.  LABORATORY DATA: Lab Results  Component Value Date   WBC 4.3 06/28/2018   HGB 10.3 (L) 06/28/2018   HCT 31.5 (L) 06/28/2018   MCV 96.0 06/28/2018   PLT 257 06/28/2018      Chemistry      Component Value Date/Time   NA 139 06/28/2018 1122   K 3.5 06/28/2018 1122   CL 100 06/28/2018 1122   CO2 30 06/28/2018 1122   BUN 29 (H) 06/28/2018 1122   CREATININE 1.62 (H) 06/28/2018 1122      Component Value Date/Time   CALCIUM 9.7 06/28/2018 1122   ALKPHOS 49 06/28/2018 1122   AST 14 (L) 06/28/2018 1122   ALT 11 06/28/2018 1122   BILITOT 0.5 06/28/2018 1122       RADIOGRAPHIC STUDIES: No results found.  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 The patient is currently undergoing a course of  concurrent chemoradiation with weekly carboplatin and paclitaxel status post 5 cycles.  He had partial response after the initial induction treatment. The patient was a started on treatment with consolidation Imfinzi status post 3 cycles.  The patient and his wife mentioned that he had more shortness of breath few days after the last 2 treatments.  He also had one episode of chest pain that completely resolved. I had a lengthy discussion with the patient and his wife about his condition.  His last CT angiogram of the chest performed in [place] showed no evidence for pulmonary embolism or any concerning sign of immunotherapy mediated pneumonitis. I recommended for the patient to continue his current treatment with Imfinzi with close monitoring of his symptoms and consideration of treatment with steroids if needed but the patient and his wife were much concerned about the shortness of breath that he had after the last 2 treatments and he would like to delay his treatment until after  Christmas. I agreed with the patient his request and he is expected to start cycle #4 in 2 weeks. For the intermittent chest pain he will continue his current treatment with tramadol if needed. For hypertension the patient will continue with his current blood pressure medication and will consult with his primary care physician for adjustment of his medication if needed. The patient was advised to call immediately if he has any concerning symptoms in the interval. The patient voices understanding of current disease status and treatment options and is in agreement with the current care plan.  All questions were answered. The patient knows to call the clinic with any problems, questions or concerns. We can certainly see the patient much sooner if necessary.  I spent 15 minutes counseling the patient face to face. The total time spent in the appointment was 25 minutes.  Disclaimer: This note was dictated with voice recognition software. Similar sounding words can inadvertently be transcribed and may not be corrected upon review.

## 2018-06-28 NOTE — Addendum Note (Signed)
Addended by: Teodoro Spray on: 06/28/2018 03:04 PM   Modules accepted: Orders

## 2018-06-29 ENCOUNTER — Telehealth: Payer: Self-pay | Admitting: Internal Medicine

## 2018-06-29 NOTE — Telephone Encounter (Signed)
3 cycles already scheduled per 12/18 los - no additional appts added at the moment.

## 2018-07-10 ENCOUNTER — Inpatient Hospital Stay: Payer: Medicare Other

## 2018-07-10 ENCOUNTER — Telehealth: Payer: Self-pay | Admitting: Internal Medicine

## 2018-07-10 ENCOUNTER — Encounter: Payer: Self-pay | Admitting: Internal Medicine

## 2018-07-10 ENCOUNTER — Inpatient Hospital Stay (HOSPITAL_BASED_OUTPATIENT_CLINIC_OR_DEPARTMENT_OTHER): Payer: Medicare Other | Admitting: Internal Medicine

## 2018-07-10 VITALS — BP 136/75 | HR 71 | Temp 98.1°F | Resp 18 | Ht 68.0 in | Wt 254.3 lb

## 2018-07-10 DIAGNOSIS — R5382 Chronic fatigue, unspecified: Secondary | ICD-10-CM

## 2018-07-10 DIAGNOSIS — Z5112 Encounter for antineoplastic immunotherapy: Secondary | ICD-10-CM

## 2018-07-10 DIAGNOSIS — Z794 Long term (current) use of insulin: Secondary | ICD-10-CM

## 2018-07-10 DIAGNOSIS — Z7984 Long term (current) use of oral hypoglycemic drugs: Secondary | ICD-10-CM

## 2018-07-10 DIAGNOSIS — I129 Hypertensive chronic kidney disease with stage 1 through stage 4 chronic kidney disease, or unspecified chronic kidney disease: Secondary | ICD-10-CM

## 2018-07-10 DIAGNOSIS — N189 Chronic kidney disease, unspecified: Secondary | ICD-10-CM | POA: Diagnosis not present

## 2018-07-10 DIAGNOSIS — C3411 Malignant neoplasm of upper lobe, right bronchus or lung: Secondary | ICD-10-CM

## 2018-07-10 DIAGNOSIS — Z79899 Other long term (current) drug therapy: Secondary | ICD-10-CM

## 2018-07-10 DIAGNOSIS — Z95828 Presence of other vascular implants and grafts: Secondary | ICD-10-CM

## 2018-07-10 DIAGNOSIS — R5383 Other fatigue: Secondary | ICD-10-CM

## 2018-07-10 DIAGNOSIS — E119 Type 2 diabetes mellitus without complications: Secondary | ICD-10-CM

## 2018-07-10 DIAGNOSIS — E785 Hyperlipidemia, unspecified: Secondary | ICD-10-CM

## 2018-07-10 LAB — CMP (CANCER CENTER ONLY)
ALT: 10 U/L (ref 0–44)
AST: 13 U/L — ABNORMAL LOW (ref 15–41)
Albumin: 3.1 g/dL — ABNORMAL LOW (ref 3.5–5.0)
Alkaline Phosphatase: 43 U/L (ref 38–126)
Anion gap: 12 (ref 5–15)
BUN: 37 mg/dL — ABNORMAL HIGH (ref 8–23)
CO2: 25 mmol/L (ref 22–32)
Calcium: 9.6 mg/dL (ref 8.9–10.3)
Chloride: 102 mmol/L (ref 98–111)
Creatinine: 1.43 mg/dL — ABNORMAL HIGH (ref 0.61–1.24)
GFR, Est AFR Am: 57 mL/min — ABNORMAL LOW (ref >60)
GFR, Estimated: 49 mL/min — ABNORMAL LOW (ref >60)
Glucose, Bld: 124 mg/dL — ABNORMAL HIGH (ref 70–99)
Potassium: 3.6 mmol/L (ref 3.5–5.1)
Sodium: 139 mmol/L (ref 135–145)
Total Bilirubin: 0.5 mg/dL (ref 0.3–1.2)
Total Protein: 7 g/dL (ref 6.5–8.1)

## 2018-07-10 LAB — CBC WITH DIFFERENTIAL (CANCER CENTER ONLY)
Abs Immature Granulocytes: 0.01 10*3/uL (ref 0.00–0.07)
Basophils Absolute: 0 10*3/uL (ref 0.0–0.1)
Basophils Relative: 1 %
Eosinophils Absolute: 0.4 10*3/uL (ref 0.0–0.5)
Eosinophils Relative: 10 %
HCT: 29 % — ABNORMAL LOW (ref 39.0–52.0)
Hemoglobin: 9.1 g/dL — ABNORMAL LOW (ref 13.0–17.0)
Immature Granulocytes: 0 %
Lymphocytes Relative: 11 %
Lymphs Abs: 0.4 10*3/uL — ABNORMAL LOW (ref 0.7–4.0)
MCH: 29.9 pg (ref 26.0–34.0)
MCHC: 31.4 g/dL (ref 30.0–36.0)
MCV: 95.4 fL (ref 80.0–100.0)
Monocytes Absolute: 0.3 10*3/uL (ref 0.1–1.0)
Monocytes Relative: 8 %
Neutro Abs: 2.7 10*3/uL (ref 1.7–7.7)
Neutrophils Relative %: 70 %
Platelet Count: 221 10*3/uL (ref 150–400)
RBC: 3.04 MIL/uL — ABNORMAL LOW (ref 4.22–5.81)
RDW: 14.2 % (ref 11.5–15.5)
WBC Count: 3.8 10*3/uL — ABNORMAL LOW (ref 4.0–10.5)
nRBC: 0 % (ref 0.0–0.2)

## 2018-07-10 LAB — TSH: TSH: 2.514 u[IU]/mL (ref 0.320–4.118)

## 2018-07-10 MED ORDER — HEPARIN SOD (PORK) LOCK FLUSH 100 UNIT/ML IV SOLN
500.0000 [IU] | Freq: Once | INTRAVENOUS | Status: AC | PRN
  Administered 2018-07-10: 500 [IU]
  Filled 2018-07-10: qty 5

## 2018-07-10 MED ORDER — SODIUM CHLORIDE 0.9% FLUSH
10.0000 mL | INTRAVENOUS | Status: DC | PRN
  Administered 2018-07-10: 10 mL
  Filled 2018-07-10: qty 10

## 2018-07-10 MED ORDER — SODIUM CHLORIDE 0.9 % IV SOLN
10.0000 mg/kg | Freq: Once | INTRAVENOUS | Status: AC
  Administered 2018-07-10: 1120 mg via INTRAVENOUS
  Filled 2018-07-10: qty 2.4

## 2018-07-10 MED ORDER — SODIUM CHLORIDE 0.9 % IV SOLN
Freq: Once | INTRAVENOUS | Status: DC
  Filled 2018-07-10: qty 250

## 2018-07-10 NOTE — Progress Notes (Signed)
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 First dose 02/27/2018.Status post 5 cycles.  CURRENT THERAPY:Consolidationimmunotherapy with Imfinzi 10 mg/kg every 2 weeks.First dose given on 05/17/2018.Status post 3 cycles.  INTERVAL HISTORY: Darrell [name] 72 y.o. male returns to the clinic today for follow-up visit accompanied by his wife.  The patient has been off treatment for the last 4 weeks secondary to shortness of breath.  He denied having any current chest pain, cough or hemoptysis.  He denied having any fever or chills.  He has no nausea, vomiting, diarrhea or constipation.  He continues to complain of fatigue.  He is here today for evaluation before starting cycle #4 of his treatment.  MEDICAL HISTORY: Past Medical History:  Diagnosis Date  . Cellulitis of right lower extremity   . Chronic kidney disease   . Diabetes mellitus without complication ([place])   . Dyslipidemia   . Hypertension     ALLERGIES:  is allergic to lisinopril.  MEDICATIONS:  Current Outpatient Medications  Medication Sig Dispense Refill  . ASTAXANTHIN PO Take 1 capsule by mouth daily.     . Blood Glucose Monitoring Suppl (ONE TOUCH ULTRA MINI) w/Device KIT 1 kit by Other route once.     . carvedilol (COREG) 6.25 MG tablet Take 6.25 mg by mouth 2 (two) times daily with a meal.     . Cinnamon Bark POWD Take 1,000 mg by mouth 2 (two) times daily.     . cloNIDine (CATAPRES) 0.1 MG tablet Take 0.1 mg by mouth 2 (two) times daily.     Marland Kitchen escitalopram (LEXAPRO) 10 MG tablet  Take 10 mg by mouth daily.     . fenofibrate 160 MG tablet Take 160 mg by mouth daily.     . Fish Oil-Cholecalciferol (OMEGA-3 FISH OIL-VITAMIN D3) 1200-1000 MG-UNIT CAPS Take by mouth.    . Glucosamine Sulfate 1000 MG TABS Take 2,000 mg by mouth daily.     Marland Kitchen glucose blood (PRECISION QID TEST) test strip by Misc.(Non-Drug; Combo Route) route.    . hydrochlorothiazide (HYDRODIURIL) 25 MG tablet Take 25 mg by mouth daily.     . Insulin Detemir (LEVEMIR FLEXTOUCH) 100 UNIT/ML Pen Inject 10 Units into the skin daily.     . Insulin Pen Needle (FIFTY50 PEN NEEDLES) 31G X 8 MM MISC USE AS DIRECTED    . Lancets MISC by Misc.(Non-Drug; Combo Route) route.    . lidocaine-prilocaine (EMLA) cream Apply 1 application topically as needed. 30 g 0  . losartan (COZAAR) 50 MG tablet Take 50 mg by mouth daily.     . magnesium oxide (MAG-OX) 400 MG tablet Take 400 mg by mouth daily.    . metFORMIN (GLUCOPHAGE) 1000 MG tablet Take 1,000 mg by mouth 2 (two) times daily with a meal.     . Multiple Vitamin (MULTI-VITAMINS) TABS Take 1 tablet by mouth daily.     . Omega-3 1000 MG CAPS Take 1,200 mg by mouth 2 (two) times daily.     . pioglitazone (ACTOS) 45 MG tablet Take 45 mg by mouth daily.     . pravastatin (PRAVACHOL)  40 MG tablet Take 40 mg by mouth daily.     . prochlorperazine (COMPAZINE) 10 MG tablet TAKE 1 TABLET BY MOUTH EVERY 6 HOURS AS NEEDED FOR NAUSEA OR VOMITING 385 tablet 0  . traMADol (ULTRAM) 50 MG tablet Take 1 tablet (50 mg total) by mouth every 6 (six) hours as needed for severe pain. 20 tablet 0   No current facility-administered medications for this visit.    Facility-Administered Medications Ordered in Other Visits  Medication Dose Route Frequency Provider Last Rate Last Dose  . sodium chloride flush (NS) 0.9 % injection 10 mL  10 mL Intracatheter PRN Curt Bears, MD   10 mL at 06/28/18 1501    SURGICAL HISTORY:  Past Surgical History:  Procedure Laterality Date  . PORTACATH  PLACEMENT Left 02/21/2018   Procedure: INSERTION PORT-A-CATH;  Surgeon: Grace Isaac, MD;  Location: [facility] OR;  Service: Thoracic;  Laterality: Left;    REVIEW OF SYSTEMS:  A comprehensive review of systems was negative except for: Constitutional: positive for fatigue Respiratory: positive for dyspnea on exertion   PHYSICAL EXAMINATION: General appearance: alert, cooperative, fatigued and no distress Head: Normocephalic, without obvious abnormality, atraumatic Neck: no adenopathy, no JVD, supple, symmetrical, trachea midline and thyroid not enlarged, symmetric, no tenderness/mass/nodules Lymph nodes: Cervical, supraclavicular, and axillary nodes normal. Resp: clear to auscultation bilaterally Back: symmetric, no curvature. ROM normal. No CVA tenderness. Cardio: regular rate and rhythm, S1, S2 normal, no murmur, click, rub or gallop GI: soft, non-tender; bowel sounds normal; no masses,  no organomegaly Extremities: extremities normal, atraumatic, no cyanosis or edema  ECOG PERFORMANCE STATUS: 1 - Symptomatic but completely ambulatory  Blood pressure 136/75, pulse 71, temperature 98.1 F (36.7 C), temperature source Oral, resp. rate 18, height _0  (1.727 m), weight 254 lb 4.8 oz (115.3 kg), SpO2 97 %.  LABORATORY DATA: Lab Results  Component Value Date   WBC 4.3 06/28/2018   HGB 10.3 (L) 06/28/2018   HCT 31.5 (L) 06/28/2018   MCV 96.0 06/28/2018   PLT 257 06/28/2018      Chemistry      Component Value Date/Time   NA 139 06/28/2018 1122   K 3.5 06/28/2018 1122   CL 100 06/28/2018 1122   CO2 30 06/28/2018 1122   BUN 29 (H) 06/28/2018 1122   CREATININE 1.62 (H) 06/28/2018 1122      Component Value Date/Time   CALCIUM 9.7 06/28/2018 1122   ALKPHOS 49 06/28/2018 1122   AST 14 (L) 06/28/2018 1122   ALT 11 06/28/2018 1122   BILITOT 0.5 06/28/2018 1122       RADIOGRAPHIC STUDIES: No results found.  ASSESSMENT AND PLAN: This is a very pleasant 72 years old white male  with a stage IIb/IIIa non-small cell lung cancer, squamous cell carcinoma diagnosed in July 2019. The patient is currently undergoing a course of concurrent chemoradiation with weekly carboplatin and paclitaxel status post 5 cycles.  He had partial response after the initial induction treatment. The patient was a started on treatment with consolidation Imfinzi status post 3 cycles.  His last dose was held secondary to increasing shortness of breath.  He is feeling much better today.  I recommended for the patient to proceed with cycle #4 today as scheduled. For the intermittent chest pain he will continue his current treatment with tramadol if needed. For hypertension the patient will continue with his current blood pressure medication and will consult with his primary care physician for adjustment of his medication if  needed. I will see him back for follow-up visit in 2 weeks for evaluation before the next cycle of his treatment.  The patient was advised to call immediately if he has any worsening of his symptoms after this treatment. The patient voices understanding of current disease status and treatment options and is in agreement with the current care plan. All questions were answered. The patient knows to call the clinic with any problems, questions or concerns. We can certainly see the patient much sooner if necessary.  Disclaimer: This note was dictated with voice recognition software. Similar sounding words can inadvertently be transcribed and may not be corrected upon review.

## 2018-07-10 NOTE — Patient Instructions (Signed)
[facility] Discharge Instructions for Patients Receiving Chemotherapy  Today you received the following chemotherapy agents: Imfinzi.  To help prevent nausea and vomiting after your treatment, we encourage you to take your nausea medication as directed.   If you develop nausea and vomiting that is not controlled by your nausea medication, call the clinic.   BELOW ARE SYMPTOMS THAT SHOULD BE REPORTED IMMEDIATELY:  *FEVER GREATER THAN 100.5 F  *CHILLS WITH OR WITHOUT FEVER  NAUSEA AND VOMITING THAT IS NOT CONTROLLED WITH YOUR NAUSEA MEDICATION  *UNUSUAL SHORTNESS OF BREATH  *UNUSUAL BRUISING OR BLEEDING  TENDERNESS IN MOUTH AND THROAT WITH OR WITHOUT PRESENCE OF ULCERS  *URINARY PROBLEMS  *BOWEL PROBLEMS  UNUSUAL RASH Items with * indicate a potential emergency and should be followed up as soon as possible.  Feel free to call the clinic should you have any questions or concerns. The clinic phone number is [phone].  Please show the CHEMO ALERT CARD at check-in to the Emergency Department and triage nurse.   

## 2018-07-10 NOTE — Telephone Encounter (Signed)
Printed calendar and avs. °

## 2018-07-17 ENCOUNTER — Encounter (HOSPITAL_COMMUNITY): Payer: Self-pay

## 2018-07-17 ENCOUNTER — Emergency Department (HOSPITAL_COMMUNITY): Payer: Medicare Other

## 2018-07-17 ENCOUNTER — Observation Stay (HOSPITAL_COMMUNITY)
Admission: EM | Admit: 2018-07-17 | Discharge: 2018-07-19 | Disposition: A | Discharge status: Home / Self Care | Payer: Medicare Other | Attending: Family Medicine | Admitting: Family Medicine

## 2018-07-17 DIAGNOSIS — R042 Hemoptysis: Secondary | ICD-10-CM | POA: Insufficient documentation

## 2018-07-17 DIAGNOSIS — Z79899 Other long term (current) drug therapy: Secondary | ICD-10-CM | POA: Insufficient documentation

## 2018-07-17 DIAGNOSIS — C3490 Malignant neoplasm of unspecified part of unspecified bronchus or lung: Secondary | ICD-10-CM | POA: Insufficient documentation

## 2018-07-17 DIAGNOSIS — Z9221 Personal history of antineoplastic chemotherapy: Secondary | ICD-10-CM | POA: Diagnosis not present

## 2018-07-17 DIAGNOSIS — I10 Essential (primary) hypertension: Secondary | ICD-10-CM | POA: Diagnosis present

## 2018-07-17 DIAGNOSIS — N183 Chronic kidney disease, stage 3 unspecified: Secondary | ICD-10-CM | POA: Diagnosis present

## 2018-07-17 DIAGNOSIS — Z79891 Long term (current) use of opiate analgesic: Secondary | ICD-10-CM | POA: Insufficient documentation

## 2018-07-17 DIAGNOSIS — E1122 Type 2 diabetes mellitus with diabetic chronic kidney disease: Secondary | ICD-10-CM | POA: Insufficient documentation

## 2018-07-17 DIAGNOSIS — E785 Hyperlipidemia, unspecified: Secondary | ICD-10-CM | POA: Diagnosis not present

## 2018-07-17 DIAGNOSIS — I712 Thoracic aortic aneurysm, without rupture, unspecified: Secondary | ICD-10-CM

## 2018-07-17 DIAGNOSIS — I129 Hypertensive chronic kidney disease with stage 1 through stage 4 chronic kidney disease, or unspecified chronic kidney disease: Secondary | ICD-10-CM | POA: Insufficient documentation

## 2018-07-17 DIAGNOSIS — J181 Lobar pneumonia, unspecified organism: Principal | ICD-10-CM | POA: Insufficient documentation

## 2018-07-17 DIAGNOSIS — Z923 Personal history of irradiation: Secondary | ICD-10-CM | POA: Insufficient documentation

## 2018-07-17 DIAGNOSIS — I700 Atherosclerosis of aorta: Secondary | ICD-10-CM | POA: Diagnosis not present

## 2018-07-17 DIAGNOSIS — Z794 Long term (current) use of insulin: Secondary | ICD-10-CM | POA: Insufficient documentation

## 2018-07-17 DIAGNOSIS — Z87891 Personal history of nicotine dependence: Secondary | ICD-10-CM | POA: Insufficient documentation

## 2018-07-17 DIAGNOSIS — C3411 Malignant neoplasm of upper lobe, right bronchus or lung: Secondary | ICD-10-CM | POA: Diagnosis present

## 2018-07-17 DIAGNOSIS — J189 Pneumonia, unspecified organism: Secondary | ICD-10-CM

## 2018-07-17 DIAGNOSIS — E119 Type 2 diabetes mellitus without complications: Secondary | ICD-10-CM

## 2018-07-17 HISTORY — DX: Malignant (primary) neoplasm, unspecified: C80.1

## 2018-07-17 HISTORY — DX: Thoracic aortic aneurysm, without rupture: I71.2

## 2018-07-17 HISTORY — DX: Atherosclerosis of aorta: I70.0

## 2018-07-17 NOTE — ED Triage Notes (Signed)
Pt states he's a lung cancer patient, his last treatment was last Monday, pt states that he's been coughing some today and once he coughed up bright red blood

## 2018-07-17 NOTE — ED Notes (Signed)
Patient ambulatory to and from x-ray with NAD.

## 2018-07-18 ENCOUNTER — Other Ambulatory Visit: Payer: Self-pay

## 2018-07-18 ENCOUNTER — Emergency Department (HOSPITAL_COMMUNITY): Payer: Medicare Other

## 2018-07-18 ENCOUNTER — Encounter (HOSPITAL_COMMUNITY): Payer: Self-pay

## 2018-07-18 DIAGNOSIS — R042 Hemoptysis: Secondary | ICD-10-CM

## 2018-07-18 DIAGNOSIS — C3411 Malignant neoplasm of upper lobe, right bronchus or lung: Secondary | ICD-10-CM

## 2018-07-18 DIAGNOSIS — N183 Chronic kidney disease, stage 3 unspecified: Secondary | ICD-10-CM | POA: Diagnosis present

## 2018-07-18 DIAGNOSIS — I10 Essential (primary) hypertension: Secondary | ICD-10-CM | POA: Diagnosis not present

## 2018-07-18 DIAGNOSIS — I712 Thoracic aortic aneurysm, without rupture, unspecified: Secondary | ICD-10-CM

## 2018-07-18 DIAGNOSIS — J181 Lobar pneumonia, unspecified organism: Secondary | ICD-10-CM

## 2018-07-18 DIAGNOSIS — J189 Pneumonia, unspecified organism: Secondary | ICD-10-CM

## 2018-07-18 DIAGNOSIS — I700 Atherosclerosis of aorta: Secondary | ICD-10-CM

## 2018-07-18 DIAGNOSIS — E1122 Type 2 diabetes mellitus with diabetic chronic kidney disease: Secondary | ICD-10-CM | POA: Diagnosis not present

## 2018-07-18 DIAGNOSIS — Z794 Long term (current) use of insulin: Secondary | ICD-10-CM

## 2018-07-18 HISTORY — DX: Thoracic aortic aneurysm, without rupture, unspecified: I71.20

## 2018-07-18 HISTORY — DX: Thoracic aortic aneurysm, without rupture: I71.2

## 2018-07-18 HISTORY — DX: Atherosclerosis of aorta: I70.0

## 2018-07-18 LAB — CBC WITH DIFFERENTIAL/PLATELET
Abs Immature Granulocytes: 0.02 10*3/uL (ref 0.00–0.07)
Basophils Absolute: 0.1 10*3/uL (ref 0.0–0.1)
Basophils Relative: 2 %
Eosinophils Absolute: 0.4 10*3/uL (ref 0.0–0.5)
Eosinophils Relative: 11 %
HCT: 31.7 % — ABNORMAL LOW (ref 39.0–52.0)
Hemoglobin: 9.7 g/dL — ABNORMAL LOW (ref 13.0–17.0)
Immature Granulocytes: 1 %
Lymphocytes Relative: 16 %
Lymphs Abs: 0.6 10*3/uL — ABNORMAL LOW (ref 0.7–4.0)
MCH: 29.9 pg (ref 26.0–34.0)
MCHC: 30.6 g/dL (ref 30.0–36.0)
MCV: 97.8 fL (ref 80.0–100.0)
Monocytes Absolute: 0.3 10*3/uL (ref 0.1–1.0)
Monocytes Relative: 9 %
Neutro Abs: 2.2 10*3/uL (ref 1.7–7.7)
Neutrophils Relative %: 61 %
Platelets: 246 10*3/uL (ref 150–400)
RBC: 3.24 MIL/uL — ABNORMAL LOW (ref 4.22–5.81)
RDW: 14.1 % (ref 11.5–15.5)
WBC: 3.5 10*3/uL — ABNORMAL LOW (ref 4.0–10.5)
nRBC: 0 % (ref 0.0–0.2)

## 2018-07-18 LAB — STREP PNEUMONIAE URINARY ANTIGEN: Strep Pneumo Urinary Antigen: NEGATIVE

## 2018-07-18 LAB — I-STAT CHEM 8, ED
BUN: 29 mg/dL — ABNORMAL HIGH (ref 8–23)
Calcium, Ion: 1.18 mmol/L (ref 1.15–1.40)
Chloride: 99 mmol/L (ref 98–111)
Creatinine, Ser: 1.6 mg/dL — ABNORMAL HIGH (ref 0.61–1.24)
Glucose, Bld: 105 mg/dL — ABNORMAL HIGH (ref 70–99)
HCT: 30 % — ABNORMAL LOW (ref 39.0–52.0)
Hemoglobin: 10.2 g/dL — ABNORMAL LOW (ref 13.0–17.0)
Potassium: 3.4 mmol/L — ABNORMAL LOW (ref 3.5–5.1)
Sodium: 135 mmol/L (ref 135–145)
TCO2: 25 mmol/L (ref 22–32)

## 2018-07-18 LAB — BASIC METABOLIC PANEL
Anion gap: 10 (ref 5–15)
BUN: 33 mg/dL — ABNORMAL HIGH (ref 8–23)
CO2: 26 mmol/L (ref 22–32)
Calcium: 9.5 mg/dL (ref 8.9–10.3)
Chloride: 99 mmol/L (ref 98–111)
Creatinine, Ser: 1.64 mg/dL — ABNORMAL HIGH (ref 0.61–1.24)
GFR calc Af Amer: 48 mL/min — ABNORMAL LOW (ref >60)
GFR calc non Af Amer: 42 mL/min — ABNORMAL LOW (ref >60)
Glucose, Bld: 108 mg/dL — ABNORMAL HIGH (ref 70–99)
Potassium: 3.4 mmol/L — ABNORMAL LOW (ref 3.5–5.1)
Sodium: 135 mmol/L (ref 135–145)

## 2018-07-18 LAB — CBG MONITORING, ED
Glucose-Capillary: 98 mg/dL (ref 70–99)
Glucose-Capillary: 118 mg/dL — ABNORMAL HIGH (ref 70–99)

## 2018-07-18 LAB — HIV ANTIBODY (ROUTINE TESTING W REFLEX): HIV Screen 4th Generation wRfx: NONREACTIVE

## 2018-07-18 LAB — GLUCOSE, CAPILLARY
Glucose-Capillary: 134 mg/dL — ABNORMAL HIGH (ref 70–99)
Glucose-Capillary: 157 mg/dL — ABNORMAL HIGH (ref 70–99)

## 2018-07-18 MED ORDER — SODIUM CHLORIDE 0.9 % IV SOLN
1.0000 g | INTRAVENOUS | Status: DC
  Administered 2018-07-18 – 2018-07-19 (×2): 1 g via INTRAVENOUS
  Filled 2018-07-18: qty 1
  Filled 2018-07-18: qty 10

## 2018-07-18 MED ORDER — SODIUM CHLORIDE (PF) 0.9 % IJ SOLN
INTRAMUSCULAR | Status: AC
  Filled 2018-07-18: qty 50

## 2018-07-18 MED ORDER — ESCITALOPRAM OXALATE 10 MG PO TABS
10.0000 mg | ORAL_TABLET | Freq: Every day | ORAL | Status: DC
  Administered 2018-07-18 – 2018-07-19 (×2): 10 mg via ORAL
  Filled 2018-07-18 (×2): qty 1

## 2018-07-18 MED ORDER — CLONIDINE HCL 0.1 MG PO TABS
0.1000 mg | ORAL_TABLET | Freq: Two times a day (BID) | ORAL | Status: DC
  Administered 2018-07-18 – 2018-07-19 (×3): 0.1 mg via ORAL
  Filled 2018-07-18 (×3): qty 1

## 2018-07-18 MED ORDER — PRAVASTATIN SODIUM 40 MG PO TABS
40.0000 mg | ORAL_TABLET | Freq: Every day | ORAL | Status: DC
  Administered 2018-07-18 – 2018-07-19 (×2): 40 mg via ORAL
  Filled 2018-07-18: qty 2
  Filled 2018-07-18: qty 1

## 2018-07-18 MED ORDER — POTASSIUM CHLORIDE CRYS ER 20 MEQ PO TBCR
40.0000 meq | EXTENDED_RELEASE_TABLET | Freq: Two times a day (BID) | ORAL | Status: AC
  Administered 2018-07-18 (×2): 40 meq via ORAL
  Filled 2018-07-18 (×2): qty 2

## 2018-07-18 MED ORDER — OMEGA-3-ACID ETHYL ESTERS 1 G PO CAPS
1.0000 g | ORAL_CAPSULE | Freq: Two times a day (BID) | ORAL | Status: DC
  Administered 2018-07-18 – 2018-07-19 (×3): 1 g via ORAL
  Filled 2018-07-18 (×3): qty 1

## 2018-07-18 MED ORDER — AZITHROMYCIN 250 MG PO TABS
500.0000 mg | ORAL_TABLET | Freq: Once | ORAL | Status: AC
  Administered 2018-07-18: 500 mg via ORAL
  Filled 2018-07-18: qty 2

## 2018-07-18 MED ORDER — INSULIN DETEMIR 100 UNIT/ML ~~LOC~~ SOLN
10.0000 [IU] | Freq: Every day | SUBCUTANEOUS | Status: DC
  Administered 2018-07-18 – 2018-07-19 (×2): 10 [IU] via SUBCUTANEOUS
  Filled 2018-07-18 (×2): qty 0.1

## 2018-07-18 MED ORDER — ADULT MULTIVITAMIN W/MINERALS CH
1.0000 | ORAL_TABLET | Freq: Every day | ORAL | Status: DC
  Administered 2018-07-18 – 2018-07-19 (×2): 1 via ORAL
  Filled 2018-07-18 (×2): qty 1

## 2018-07-18 MED ORDER — INSULIN ASPART 100 UNIT/ML ~~LOC~~ SOLN: 0.0000 [IU] | Freq: Three times a day (TID) | SUBCUTANEOUS | Status: DC

## 2018-07-18 MED ORDER — AZITHROMYCIN 250 MG PO TABS
500.0000 mg | ORAL_TABLET | ORAL | Status: DC
  Administered 2018-07-18: 500 mg via ORAL
  Filled 2018-07-18: qty 2

## 2018-07-18 MED ORDER — MAGNESIUM OXIDE 400 (241.3 MG) MG PO TABS
400.0000 mg | ORAL_TABLET | Freq: Every day | ORAL | Status: DC
  Administered 2018-07-18 – 2018-07-19 (×2): 400 mg via ORAL
  Filled 2018-07-18 (×2): qty 1

## 2018-07-18 MED ORDER — LEVOFLOXACIN IN D5W 750 MG/150ML IV SOLN
750.0000 mg | Freq: Once | INTRAVENOUS | Status: DC
  Filled 2018-07-18: qty 150

## 2018-07-18 MED ORDER — OMEGA-3 1000 MG PO CAPS: 1200.0000 mg | ORAL_CAPSULE | Freq: Two times a day (BID) | ORAL | Status: DC

## 2018-07-18 MED ORDER — HYDROCHLOROTHIAZIDE 25 MG PO TABS
25.0000 mg | ORAL_TABLET | Freq: Every day | ORAL | Status: DC
  Administered 2018-07-18 – 2018-07-19 (×2): 25 mg via ORAL
  Filled 2018-07-18 (×2): qty 1

## 2018-07-18 MED ORDER — LOSARTAN POTASSIUM 50 MG PO TABS
50.0000 mg | ORAL_TABLET | Freq: Every day | ORAL | Status: DC
  Administered 2018-07-18 – 2018-07-19 (×2): 50 mg via ORAL
  Filled 2018-07-18 (×2): qty 1

## 2018-07-18 MED ORDER — FENOFIBRATE 160 MG PO TABS
160.0000 mg | ORAL_TABLET | Freq: Every day | ORAL | Status: DC
  Administered 2018-07-18 – 2018-07-19 (×2): 160 mg via ORAL
  Filled 2018-07-18 (×2): qty 1

## 2018-07-18 MED ORDER — CARVEDILOL 6.25 MG PO TABS
6.2500 mg | ORAL_TABLET | Freq: Two times a day (BID) | ORAL | Status: DC
  Administered 2018-07-18 – 2018-07-19 (×3): 6.25 mg via ORAL
  Filled 2018-07-18 (×4): qty 1

## 2018-07-18 MED ORDER — IOHEXOL 300 MG/ML  SOLN
75.0000 mL | Freq: Once | INTRAMUSCULAR | Status: AC | PRN
  Administered 2018-07-18: 75 mL via INTRAVENOUS

## 2018-07-18 NOTE — ED Provider Notes (Signed)
[facility] DEPT Provider Note   CSN: [id number] Arrival date & time: 07/17/18  2231     History   Chief Complaint Chief Complaint  Patient presents with  . Hemoptysis    HPI Darrell [name] is a 73 y.o. male.  The history is provided by the patient and the spouse.  Cough  Cough characteristics:  Productive Sputum characteristics:  Bloody Severity:  Moderate Timing:  Intermittent Progression:  Worsening Chronicity:  New Smoker: no   Relieved by:  None tried Worsened by:  Nothing Associated symptoms: no chest pain, no chills, no fever, no myalgias and no shortness of breath    Patient with history of known non-small cell lung cancer presents with cough hemoptysis.  He reports that over the past day has had increasing cough and is now having bloody sputum.  Denies any increasing chest pain or shortness of breath.  He has had this previously.  He is currently undergoing chemotherapy.  No fevers or flulike symptoms. He has no other acute complaints Past Medical History:  Diagnosis Date  . Cancer ([place])   . Cellulitis of right lower extremity   . Chronic kidney disease   . Diabetes mellitus without complication ([place])   . Dyslipidemia   . Hypertension     Patient Active Problem List   Diagnosis Date Noted  . Encounter for antineoplastic immunotherapy 05/17/2018  . Port-A-Cath in place 03/27/2018  . Encounter for antineoplastic chemotherapy 02/16/2018  . 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 Goals of care, counseling/discussion 02/07/2018  . ANGIOKERATOMA, BLEEDING 10/06/2006  . DM, UNCOMPLICATED, TYPE II, UNCONTROLLED 10/06/2006  . HYPERLIPIDEMIA 10/06/2006  . HYPERTENSION, BENIGN 10/06/2006  . LATERAL MENISCUS TEAR, RIGHT 10/06/2006  . CHOLECYSTECTOMY, HX OF 10/06/2006    Past Surgical History:  Procedure Laterality Date  . PORTACATH PLACEMENT Left 02/21/2018   Procedure: INSERTION PORT-A-CATH;  Surgeon: Grace Isaac, MD;   Location: [facility] OR;  Service: Thoracic;  Laterality: Left;        Home Medications    Prior to Admission medications   Medication Sig Start Date End Date Taking? Authorizing Provider  carvedilol (COREG) 6.25 MG tablet Take 6.25 mg by mouth 2 (two) times daily with a meal.  07/19/16  Yes [provider]  Cinnamon Bark POWD Take 1,000 mg by mouth 2 (two) times daily.    Yes [provider]  cloNIDine (CATAPRES) 0.1 MG tablet Take 0.1 mg by mouth 2 (two) times daily.  08/31/16  Yes [provider]  escitalopram (LEXAPRO) 10 MG tablet Take 10 mg by mouth daily.  11/13/17  Yes [provider]  fenofibrate 160 MG tablet Take 160 mg by mouth daily.  11/13/17  Yes [provider]  Glucosamine Sulfate 1000 MG TABS Take 2,000 mg by mouth daily.    Yes [provider]  hydrochlorothiazide (HYDRODIURIL) 25 MG tablet Take 25 mg by mouth daily.  02/06/18  Yes [provider]  Insulin Detemir (LEVEMIR FLEXTOUCH) 100 UNIT/ML Pen Inject 10 Units into the skin daily.  07/18/15  Yes [provider]  lidocaine-prilocaine (EMLA) cream Apply 1 application topically as needed. Patient taking differently: Apply 1 application topically as needed (port).  02/16/18  Yes Curt Bears, MD  losartan (COZAAR) 50 MG tablet Take 50 mg by mouth daily.  01/08/18  Yes [provider]  magnesium oxide (MAG-OX) 400 MG tablet Take 400 mg by mouth daily.   Yes [provider]  metFORMIN (GLUCOPHAGE) 1000  MG tablet Take 1,000 mg by mouth 2 (two) times daily with a meal.  11/13/17  Yes [provider]  Multiple Vitamin (MULTI-VITAMINS) TABS Take 1 tablet by mouth daily.    Yes [provider]  Omega-3 1000 MG CAPS Take 1,200 mg by mouth 2 (two) times daily.    Yes [provider]  pioglitazone (ACTOS) 45 MG tablet Take 45 mg by mouth daily.  02/06/18  Yes [provider]  pravastatin (PRAVACHOL) 40 MG tablet Take 40 mg by  mouth daily.  01/08/18  Yes [provider]  prochlorperazine (COMPAZINE) 10 MG tablet TAKE 1 TABLET BY MOUTH EVERY 6 HOURS AS NEEDED FOR NAUSEA OR VOMITING Patient taking differently: Take 10 mg by mouth every 6 (six) hours as needed for nausea or vomiting.  02/21/18  Yes Curt Bears, MD  traMADol (ULTRAM) 50 MG tablet Take 1 tablet (50 mg total) by mouth every 6 (six) hours as needed for severe pain. 06/23/18  Yes Curt Bears, MD  ASTAXANTHIN PO Take 1 capsule by mouth daily.     [provider]  Blood Glucose Monitoring Suppl (ONE TOUCH ULTRA MINI) w/Device KIT 1 kit by Other route once.     [provider]  glucose blood (PRECISION QID TEST) test strip by Misc.(Non-Drug; Combo Route) route.    [provider]  Insulin Pen Needle (FIFTY50 PEN NEEDLES) 31G X 8 MM MISC USE AS DIRECTED 06/30/15   [provider]  Lancets MISC by Misc.(Non-Drug; Combo Route) route.    [provider]    Family History Family History  Problem Relation Age of Onset  . Breast cancer Mother   . CAD Father     Social History Social History   Tobacco Use  . Smoking status: Former Smoker    Packs/day: 1.50    Years: 20.00    Pack years: 30.00    Types: Cigarettes    Last attempt to quit: 1988    Years since quitting: 32.0  . Smokeless tobacco: Never Used  Substance Use Topics  . Alcohol use: Yes    Comment: occasionally  . Drug use: Never     Allergies   Lisinopril   Review of Systems Review of Systems  Constitutional: Negative for chills, fatigue and fever.  HENT: Negative for nosebleeds.   Respiratory: Positive for cough. Negative for shortness of breath.   Cardiovascular: Negative for chest pain.  Gastrointestinal: Negative for diarrhea and vomiting.  Musculoskeletal: Negative for myalgias.  All other systems reviewed and are negative.    Physical Exam Updated Vital Signs BP (!) 159/95 (BP Location: Left Arm)   Pulse 80    Temp 98.1 F (36.7 C) (Oral)   Resp 20   SpO2 95%   Physical Exam CONSTITUTIONAL: Well developed/well nourished HEAD: Normocephalic/atraumatic EYES: EOMI/PERRL ENMT: Mucous membranes moist, no blood in nares, no blood in mouth NECK: supple no meningeal signs SPINE/BACK:entire spine nontender CV: S1/S2 noted, no murmurs/rubs/gallops noted LUNGS: Scattered wheezing bilaterally, no acute distress ABDOMEN: soft, nontender, no rebound or guarding, bowel sounds noted throughout abdomen NEURO: Pt is awake/alert/appropriate, moves all extremitiesx4.  No facial droop.   EXTREMITIES: pulses normal/equal, full ROM SKIN: warm, color normal PSYCH: no abnormalities of mood noted, alert and oriented to situation   ED Treatments / Results  Labs (all labs ordered are listed, but only abnormal results are displayed) Labs Reviewed  BASIC METABOLIC PANEL - Abnormal; Notable for the following components:      Result  Value   Potassium 3.4 (*)    Glucose, Bld 108 (*)    BUN 33 (*)    Creatinine, Ser 1.64 (*)    GFR calc non Af Amer 42 (*)    GFR calc Af Amer 48 (*)    All other components within normal limits  CBC WITH DIFFERENTIAL/PLATELET - Abnormal; Notable for the following components:   WBC 3.5 (*)    RBC 3.24 (*)    Hemoglobin 9.7 (*)    HCT 31.7 (*)    Lymphs Abs 0.6 (*)    All other components within normal limits  I-STAT CHEM 8, ED - Abnormal; Notable for the following components:   Potassium 3.4 (*)    BUN 29 (*)    Creatinine, Ser 1.60 (*)    Glucose, Bld 105 (*)    Hemoglobin 10.2 (*)    HCT 30.0 (*)    All other components within normal limits  CULTURE, BLOOD (ROUTINE X 2)  CULTURE, BLOOD (ROUTINE X 2)  EXPECTORATED SPUTUM ASSESSMENT W REFEX TO RESP CULTURE  GRAM STAIN  HIV ANTIBODY (ROUTINE TESTING W REFLEX)  STREP PNEUMONIAE URINARY ANTIGEN    EKG None  Radiology Dg Chest 2 View  Result Date: 07/18/2018 CLINICAL DATA:  Cough EXAM: CHEST - 2 VIEW COMPARISON:  CT  05/01/2018, radiograph 12/22/2017 FINDINGS: Left-sided central venous port tip over the SVC. Volume loss in the right thorax. Small bilateral pleural effusions. Increased right upper lobe opacity. Borderline cardiomegaly. No pneumothorax. Degenerative changes of the spine. IMPRESSION: 1. Development of small bilateral pleural effusions. 2. Volume loss in the right thorax with new tenting of the right diaphragm. Rounded opacity in the right upper lobe may reflect pneumonia or recurrent mass lesion. Contrast-enhanced CT is suggested for further evaluation. Electronically Signed   By: Donavan Foil M.D.   On: 07/18/2018 00:03   Ct Chest W Contrast  Result Date: 07/18/2018 CLINICAL DATA:  Productive cough with hemoptysis. Treatment for lung cancer July, 2019 with radiation and chemotherapy. EXAM: CT CHEST WITH CONTRAST TECHNIQUE: Multidetector CT imaging of the chest was performed during intravenous contrast administration. CONTRAST:  7m OMNIPAQUE IOHEXOL 300 MG/ML  SOLN COMPARISON:  07/01/2018 CT FINDINGS: Cardiovascular: Normal heart size with trace pericardial effusion. 4.4 cm ascending thoracic aortic aneurysm with atherosclerosis. No dissection. No large central pulmonary embolus. Patent great vessels with atherosclerosis. Scattered coronary arteriosclerosis along the LAD and left circumflex. Mediastinum/Nodes: No enlarged mediastinal, hilar, or axillary lymph nodes. Thyroid gland, trachea, and esophagus demonstrate no significant findings. Lungs/Pleura: Bilateral small to moderate pleural effusions. New masslike pulmonary consolidations with air bronchograms in the right upper lobe and superior segment of right lower lobe. Volume loss with mild tenting along the inferior pulmonary ligament on the right. Consolidation and atelectasis noted at the left lung base laterally. Upper Abdomen: No acute abnormality. Musculoskeletal: No chest wall abnormality. Accentuated thoracic curvature due to multilevel  degenerative disc disease. Diffuse idiopathic skeletal hyperostosis with flowing anterior osteophytes are noted. IMPRESSION: 1. New masslike pulmonary consolidations with air bronchograms in the right upper lobe and superior segment of right lower lobe with volume loss and mild tenting along the inferior pulmonary ligament. Findings are in keeping with multifocal pneumonia. Followup PA and lateral chest X-ray is recommended in 3-4 weeks following trial of antibiotic therapy to ensure resolution and exclude recurrence of malignancy. 2. Small to moderate bilateral pleural effusions. 3. 4.4 cm ascending thoracic aortic aneurysm. Recommend annual imaging followup by CTA or MRA. This recommendation follows  2010 ACCF/AHA/AATS/ACR/ASA/SCA/SCAI/SIR/STS/SVM Guidelines for the Diagnosis and Management of Patients with Thoracic Aortic Disease. 2010; 121: T143-O887. 4. Coronary arteriosclerosis. Aortic Atherosclerosis (ICD10-I70.0). Electronically Signed   By: Ashley Royalty M.D.   On: 07/18/2018 01:46    Procedures Procedures (including critical care time)  Medications Ordered in ED Medications  sodium chloride (PF) 0.9 % injection (has no administration in time range)  cefTRIAXone (ROCEPHIN) 1 g in sodium chloride 0.9 % 100 mL IVPB (has no administration in time range)  azithromycin (ZITHROMAX) tablet 500 mg (has no administration in time range)  iohexol (OMNIPAQUE) 300 MG/ML solution 75 mL (75 mLs Intravenous Contrast Given 07/18/18 0055)     Initial Impression / Assessment and Plan / ED Course  I have reviewed the triage vital signs and the nursing notes.  Pertinent labs & imaging results that were available during my care of the patient were reviewed by me and considered in my medical decision making (see chart for details).     12:06 AM Patient with history of non-small cell lung cancer has known right hilar mass with questionable mediastinal invasion X-ray results reviewed.  CT chest is  recommended. 2:44 AM I reviewed CT findings with the radiologist, he feels this is more than likely multifocal pneumonia.  Patient will need IV antibiotics.  Patient to be admitted.  He does report when he lies down the cough gets worse and he has recurrence of his symptoms. Will be admitted to hospitalist service.  Discussed with Dr. Alcario Drought for admission Final Clinical Impressions(s) / ED Diagnoses   Final diagnoses:  Hemoptysis  Multifocal pneumonia    ED Discharge Orders    None       Ripley Fraise, MD 07/18/18 [phone]

## 2018-07-18 NOTE — ED Notes (Signed)
Lab reported that they had received only 1 set of blood cultures although a second set had been clicked off as if it was drawn. Informed Lab that pt had received ABX at 3 am .

## 2018-07-18 NOTE — ED Notes (Signed)
ED TO INPATIENT HANDOFF REPORT  Name/Age/Gender Darrell [name] 73 y.o. male  Code Status    Code Status Orders  (From admission, onward)         Start     Ordered   07/18/18 0242  Full code  Continuous     07/18/18 0242        Code Status History    This patient has a current code status but no historical code status.      Home/SNF/Other Home  Chief Complaint Hemoptysis  Level of Care/Admitting Diagnosis ED Disposition    ED Disposition Condition Comment   Admit  Hospital Area: [facility] [[id number]]  Level of Care: Med-Surg [16]  Diagnosis: Hemoptysis [976734]  Admitting Physician: Etta Quill [[id number]]  Attending Physician: Etta Quill [[id number]]  PT Class (Do Not Modify): Observation [104]  PT Acc Code (Do Not Modify): Observation [10022]       Medical History Past Medical History:  Diagnosis Date  . Aortic atherosclerosis ([place]) 07/18/2018  . Cancer ([place])   . Cellulitis of right lower extremity   . Chronic kidney disease   . Diabetes mellitus without complication ([place])   . Dyslipidemia   . Hypertension   . Thoracic aortic aneurysm without rupture ([place]) 07/18/2018    Allergies Allergies  Allergen Reactions  . Lisinopril Cough         IV Location/Drains/Wounds Patient Lines/Drains/Airways Status   Active Line/Drains/Airways    Name:   Placement date:   Placement time:   Site:   Days:   Implanted Port 02/21/18 Left Chest   02/21/18    1418    Chest   147   Incision (Closed) 02/21/18 Chest Left   02/21/18    1414     147          Labs/Imaging Results for orders placed or performed during the hospital encounter of 07/17/18 (from the past 48 hour(s))  Basic metabolic panel     Status: Abnormal   Collection Time: 07/18/18 12:15 AM  Result Value Ref Range   Sodium 135 135 - 145 mmol/L   Potassium 3.4 (L) 3.5 - 5.1 mmol/L   Chloride 99 98 - 111 mmol/L   CO2 26 22 - 32 mmol/L   Glucose, Bld 108 (H) 70 - 99 mg/dL   BUN 33  (H) 8 - 23 mg/dL   Creatinine, Ser 1.64 (H) 0.61 - 1.24 mg/dL   Calcium 9.5 8.9 - 10.3 mg/dL   GFR calc non Af Amer 42 (L) >60 mL/min   GFR calc Af Amer 48 (L) >60 mL/min   Anion gap 10 5 - 15    Comment: Performed at [facility], [place] [address] [place], [place] [address]  CBC with Differential/Platelet     Status: Abnormal   Collection Time: 07/18/18 12:15 AM  Result Value Ref Range   WBC 3.5 (L) 4.0 - 10.5 K/uL   RBC 3.24 (L) 4.22 - 5.81 MIL/uL   Hemoglobin 9.7 (L) 13.0 - 17.0 g/dL   HCT 31.7 (L) 39.0 - 52.0 %   MCV 97.8 80.0 - 100.0 fL   MCH 29.9 26.0 - 34.0 pg   MCHC 30.6 30.0 - 36.0 g/dL   RDW 14.1 11.5 - 15.5 %   Platelets 246 150 - 400 K/uL   nRBC 0.0 0.0 - 0.2 %   Neutrophils Relative % 61 %   Neutro Abs 2.2 1.7 - 7.7 K/uL   Lymphocytes Relative  16 %   Lymphs Abs 0.6 (L) 0.7 - 4.0 K/uL   Monocytes Relative 9 %   Monocytes Absolute 0.3 0.1 - 1.0 K/uL   Eosinophils Relative 11 %   Eosinophils Absolute 0.4 0.0 - 0.5 K/uL   Basophils Relative 2 %   Basophils Absolute 0.1 0.0 - 0.1 K/uL   Immature Granulocytes 1 %   Abs Immature Granulocytes 0.02 0.00 - 0.07 K/uL    Comment: Performed at [facility], [place] [address], [place], [place] [address]  I-stat chem 8, ed     Status: Abnormal   Collection Time: 07/18/18 12:23 AM  Result Value Ref Range   Sodium 135 135 - 145 mmol/L   Potassium 3.4 (L) 3.5 - 5.1 mmol/L   Chloride 99 98 - 111 mmol/L   BUN 29 (H) 8 - 23 mg/dL   Creatinine, Ser 1.60 (H) 0.61 - 1.24 mg/dL   Glucose, Bld 105 (H) 70 - 99 mg/dL   Calcium, Ion 1.18 1.15 - 1.40 mmol/L   TCO2 25 22 - 32 mmol/L   Hemoglobin 10.2 (L) 13.0 - 17.0 g/dL   HCT 30.0 (L) 39.0 - 52.0 %  Strep pneumoniae urinary antigen     Status: None   Collection Time: 07/18/18  2:41 AM  Result Value Ref Range   Strep Pneumo Urinary Antigen NEGATIVE NEGATIVE    Comment:        Infection due to S. pneumoniae cannot be absolutely ruled out since the  antigen present may be below the detection limit of the test. Performed at [facility], [address] [place], [place] [address]   HIV antibody (Routine Screening)     Status: None   Collection Time: 07/18/18  3:13 AM  Result Value Ref Range   HIV Screen 4th Generation wRfx Non Reactive Non Reactive    Comment: (NOTE) Performed At: [facility] [place], [place] [id number] Rush Farmer MD CV:[id number]   CBG monitoring, ED     Status: None   Collection Time: 07/18/18  8:13 AM  Result Value Ref Range   Glucose-Capillary 98 70 - 99 mg/dL  CBG monitoring, ED     Status: Abnormal   Collection Time: 07/18/18 12:15 PM  Result Value Ref Range   Glucose-Capillary 118 (H) 70 - 99 mg/dL   Dg Chest 2 View  Result Date: 07/18/2018 CLINICAL DATA:  Cough EXAM: CHEST - 2 VIEW COMPARISON:  CT 05/01/2018, radiograph 12/22/2017 FINDINGS: Left-sided central venous port tip over the SVC. Volume loss in the right thorax. Small bilateral pleural effusions. Increased right upper lobe opacity. Borderline cardiomegaly. No pneumothorax. Degenerative changes of the spine. IMPRESSION: 1. Development of small bilateral pleural effusions. 2. Volume loss in the right thorax with new tenting of the right diaphragm. Rounded opacity in the right upper lobe may reflect pneumonia or recurrent mass lesion. Contrast-enhanced CT is suggested for further evaluation. Electronically Signed   By: Donavan Foil M.D.   On: 07/18/2018 00:03   Ct Chest W Contrast  Result Date: 07/18/2018 CLINICAL DATA:  Productive cough with hemoptysis. Treatment for lung cancer July, 2019 with radiation and chemotherapy. EXAM: CT CHEST WITH CONTRAST TECHNIQUE: Multidetector CT imaging of the chest was performed during intravenous contrast administration. CONTRAST:  1mL OMNIPAQUE IOHEXOL 300 MG/ML  SOLN COMPARISON:  07/01/2018 CT FINDINGS: Cardiovascular: Normal heart size with trace pericardial effusion. 4.4 cm  ascending thoracic aortic aneurysm with atherosclerosis. No dissection. No large central pulmonary embolus. Patent  great vessels with atherosclerosis. Scattered coronary arteriosclerosis along the LAD and left circumflex. Mediastinum/Nodes: No enlarged mediastinal, hilar, or axillary lymph nodes. Thyroid gland, trachea, and esophagus demonstrate no significant findings. Lungs/Pleura: Bilateral small to moderate pleural effusions. New masslike pulmonary consolidations with air bronchograms in the right upper lobe and superior segment of right lower lobe. Volume loss with mild tenting along the inferior pulmonary ligament on the right. Consolidation and atelectasis noted at the left lung base laterally. Upper Abdomen: No acute abnormality. Musculoskeletal: No chest wall abnormality. Accentuated thoracic curvature due to multilevel degenerative disc disease. Diffuse idiopathic skeletal hyperostosis with flowing anterior osteophytes are noted. IMPRESSION: 1. New masslike pulmonary consolidations with air bronchograms in the right upper lobe and superior segment of right lower lobe with volume loss and mild tenting along the inferior pulmonary ligament. Findings are in keeping with multifocal pneumonia. Followup PA and lateral chest X-ray is recommended in 3-4 weeks following trial of antibiotic therapy to ensure resolution and exclude recurrence of malignancy. 2. Small to moderate bilateral pleural effusions. 3. 4.4 cm ascending thoracic aortic aneurysm. Recommend annual imaging followup by CTA or MRA. This recommendation follows 2010 ACCF/AHA/AATS/ACR/ASA/SCA/SCAI/SIR/STS/SVM Guidelines for the Diagnosis and Management of Patients with Thoracic Aortic Disease. 2010; 121: T016-W109. 4. Coronary arteriosclerosis. Aortic Atherosclerosis (ICD10-I70.0). Electronically Signed   By: Ashley Royalty M.D.   On: 07/18/2018 01:46   None  Pending Labs Unresulted Labs (From admission, onward)    Start     Ordered   07/18/18 0241   Culture, blood (routine x 2) Call MD if unable to obtain prior to antibiotics being given  BLOOD CULTURE X 2,   R    Comments:  If blood cultures drawn in Emergency Department - Do not draw and cancel order    07/18/18 0242   07/18/18 0241  Culture, sputum-assessment  Once,   R     07/18/18 0242   07/18/18 0241  Gram stain  Once,   R     07/18/18 0242          Vitals/Pain Today's Vitals   07/17/18 2308 07/18/18 0342 07/18/18 0806 07/18/18 1109  BP: (!) 159/95 (!) 176/89 (!) 161/99 (!) 156/92  Pulse: 80 79 88 80  Resp: 20 20 18 17   Temp: 98.1 F (36.7 C)   98.1 F (36.7 C)  TempSrc: Oral   Oral  SpO2: 95% 97% 96% 96%  PainSc:   0-No pain     Isolation Precautions No active isolations  Medications Medications  sodium chloride (PF) 0.9 % injection (has no administration in time range)  cefTRIAXone (ROCEPHIN) 1 g in sodium chloride 0.9 % 100 mL IVPB (0 g Intravenous Stopped 07/18/18 0409)  azithromycin (ZITHROMAX) tablet 500 mg (has no administration in time range)  carvedilol (COREG) tablet 6.25 mg (6.25 mg Oral Given 07/18/18 0917)  cloNIDine (CATAPRES) tablet 0.1 mg (0.1 mg Oral Given 07/18/18 0929)  insulin detemir (LEVEMIR) injection 10 Units (10 Units Subcutaneous Given 07/18/18 0918)  hydrochlorothiazide (HYDRODIURIL) tablet 25 mg (25 mg Oral Given 07/18/18 0917)  losartan (COZAAR) tablet 50 mg (50 mg Oral Given 07/18/18 0918)  pravastatin (PRAVACHOL) tablet 40 mg (40 mg Oral Given 07/18/18 0918)  magnesium oxide (MAG-OX) tablet 400 mg (400 mg Oral Given 07/18/18 0928)  multivitamin with minerals tablet 1 tablet (1 tablet Oral Given 07/18/18 0928)  fenofibrate tablet 160 mg (160 mg Oral Given 07/18/18 0917)  escitalopram (LEXAPRO) tablet 10 mg (10 mg Oral Given 07/18/18 0917)  insulin aspart (novoLOG)  injection 0-9 Units (0 Units Subcutaneous Not Given 07/18/18 1302)  omega-3 acid ethyl esters (LOVAZA) capsule 1 g (1 g Oral Given 07/18/18 0917)  potassium chloride SA (K-DUR,KLOR-CON) CR  tablet 40 mEq (40 mEq Oral Given 07/18/18 1140)  iohexol (OMNIPAQUE) 300 MG/ML solution 75 mL (75 mLs Intravenous Contrast Given 07/18/18 0055)  azithromycin (ZITHROMAX) tablet 500 mg (500 mg Oral Given 07/18/18 0340)    Mobility walks

## 2018-07-18 NOTE — ED Notes (Signed)
Patient ambulatory to restroom to provide urine specimen.

## 2018-07-18 NOTE — Progress Notes (Signed)
PROGRESS NOTE  Darrell [name] QPY:[id number] DOB: Jun 24, 1946 DOA: 07/17/2018 PCP: [name], MD  Brief History   73 year old man PMH stage IIb/IIIa non-small cell lung cancer presented with acute onset of hemoptysis.  Imaging was more suggestive of pneumonia rather than tumor.  He was admitted for further treatment.  A & P  Multilobar pneumonia with small volume hemoptysis, complicated by known non-small cell lung cancer. --Imaging felt to more likely reflect pneumonia rather than progression of tumor although this cannot be excluded at this point. --Hemoptysis is small volume.  Treat pneumonia and follow clinically.  Avoid anticoagulants. --Empiric antibiotics --Will notify Dr. 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  Currently undergoing chemotherapy. --Dr. 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 --Creatinine stable.  Follow clinically.  4.4 cm ascending thoracic aortic aneurysm.  Recommend annual imaging by CTA or MRA. --Incidental finding.  Follow-up as an outpatient.  Aortic atherosclerosis --Continue statin   DVT prophylaxis: SCDs Code Status: Full Family Communication: wife at bedside Disposition Plan: home    Murray Hodgkins, MD  [facility] Direct contact: [phone] --[place]ia [place]  --[email or web address]; password [email or web address]  7PM-7AM contact night coverage as above 07/18/2018, 11:25 AM  LOS: 0 days   Consultants  . Message sent to Dr. [name]  Procedures  .   Antibiotics  . Ceftriaxone 1/6 > . Azithromycin 1/6 >  Interval History/Subjective  Low volume hemoptysis onset last night.  Short of breath, ongoing cough.  No vomiting.  Wife at bedside.  Objective   Vitals:  Vitals:   07/18/18 0806 07/18/18 1109  BP: (!) 161/99 (!) 156/92  Pulse: 88 80  Resp: 18 17  Temp:  98.1 F (36.7 C)  SpO2: 96% 96%     Exam:  Constitutional:  . Appears calm, fairly comfortable, nontoxic Eyes:  . pupils and irises appear normal ENMT:  . grossly normal hearing  Respiratory:  . CTA bilaterally, no w/r/r.  . Respiratory effort normal.  Cardiovascular:  . RRR, no m/r/g . No LE extremity edema   Abdomen:  . Soft, nontender Psychiatric:  . Mental status o Mood, affect appropriate  I have personally reviewed the following:   Today's Data  . Afebrile, vital signs stable . Creatinine stable at 1.60, potassium 3.4 . Hemoglobin stable 10.2, platelets 3.5 no significant change compared to previous study.  Platelets within normal limits. . Strep pneumoniae urinary antigen negative  Lab Data  .   Micro Data  . 1/7 blood cultures  Imaging  . 1/7 CT chest new pulmonary consolidations right upper lobe, right lower lobe suggestive of multifocal pneumonia.  4.4 cm ascending thoracic aortic aneurysm. . 1/7 chest x-ray independently reviewed showed right upper lobe opacity suggestive of pneumonia.  Cardiology Data  .   Other Data  .   Scheduled Meds: . azithromycin  500 mg Oral Q24H  . carvedilol  6.25 mg Oral BID WC  . cloNIDine  0.1 mg Oral BID  . escitalopram  10 mg Oral Daily  . fenofibrate  160 mg Oral Daily  . hydrochlorothiazide  25 mg Oral Daily  . insulin aspart  0-9 Units Subcutaneous TID WC  . insulin detemir  10 Units Subcutaneous Daily  . losartan  50 mg Oral Daily  . magnesium oxide  400 mg Oral Daily  . multivitamin with minerals  1 tablet Oral Daily  . omega-3  acid ethyl esters  1 g Oral BID  . potassium chloride  40 mEq Oral BID  . pravastatin  40 mg Oral Daily  . sodium chloride (PF)       Continuous Infusions: . cefTRIAXone (ROCEPHIN)  IV Stopped (07/18/18 0409)    Principal Problem:   Lobar pneumonia (HCC) Active Problems:   DM2 (diabetes mellitus, type 2) (HCC)   HYPERTENSION, BENIGN   Stage III squamous cell cancer   Hemoptysis   CKD (chronic kidney  disease) stage 3, GFR 30-59 ml/min (HCC)   Thoracic aortic aneurysm without rupture ([place])   Aortic atherosclerosis ([place])   LOS: 0 days   Time approximately 940- 1015.  Review of chart, discussion with patient at bedside, interview, review of findings, I showed patient and his wife the chest x-ray, discussion of treatment plan, message left for oncologist.

## 2018-07-18 NOTE — H&P (Signed)
History and Physical    Darrell [name] MGN:[id number] DOB: 03-29-1946 DOA: 07/17/2018  PCP: [name], MD  Patient coming from: Home  I have personally briefly reviewed patient's old medical records in [place]  Chief Complaint: Hemoptysis  HPI: Darrell [name] is a 73 y.o. male with medical history significant of NSCLC stage 3 s/p chemoradiation currently on immunotherapy, HTN, DM2.  Patient presents to the ED with c/o hemoptysis.  Patient having increased cough over the past day which became bloody prompting him to seek treatment in the ED.  No fevers, chills, myalgias, other URI symptoms, SOB.  No sick contacts.   ED Course: CT chest obtained, radiologist favors multifocal PNA over recurrent CA at this time.  Patient started on ABx and hospitalist asked to admit.   Review of Systems: As per HPI otherwise 10 point review of systems negative.   Past Medical History:  Diagnosis Date  . Cancer ([place])   . Cellulitis of right lower extremity   . Chronic kidney disease   . Diabetes mellitus without complication ([place])   . Dyslipidemia   . Hypertension     Past Surgical History:  Procedure Laterality Date  . PORTACATH PLACEMENT Left 02/21/2018   Procedure: INSERTION PORT-A-CATH;  Surgeon: Grace Isaac, MD;  Location: [facility];  Service: Thoracic;  Laterality: Left;     reports that he quit smoking about 32 years ago. His smoking use included cigarettes. He has a 30.00 pack-year smoking history. He has never used smokeless tobacco. He reports current alcohol use. He reports that he does not use drugs.  Allergies  Allergen Reactions  . Lisinopril Cough         Family History  Problem Relation Age of Onset  . Breast cancer Mother   . CAD Father      Prior to Admission medications   Medication Sig Start Date End Date Taking? Authorizing Provider  carvedilol (COREG) 6.25 MG tablet Take 6.25 mg by mouth 2 (two) times daily with a meal.  07/19/16  Yes [provider]  Cinnamon Bark POWD Take 1,000 mg by mouth 2 (two) times daily.    Yes [provider]  cloNIDine (CATAPRES) 0.1 MG tablet Take 0.1 mg by mouth 2 (two) times daily.  08/31/16  Yes [provider]  escitalopram (LEXAPRO) 10 MG tablet Take 10 mg by mouth daily.  11/13/17  Yes [provider]  fenofibrate 160 MG tablet Take 160 mg by mouth daily.  11/13/17  Yes [provider]  Glucosamine Sulfate 1000 MG TABS Take 2,000 mg by mouth daily.    Yes [provider]  hydrochlorothiazide (HYDRODIURIL) 25 MG tablet Take 25 mg by mouth daily.  02/06/18  Yes [provider]  Insulin Detemir (LEVEMIR FLEXTOUCH) 100 UNIT/ML Pen Inject 10 Units into the skin daily.  07/18/15  Yes [provider]  lidocaine-prilocaine (EMLA) cream Apply 1 application topically as needed. Patient taking differently: Apply 1 application topically as needed (port).  02/16/18  Yes Curt Bears, MD  losartan (COZAAR) 50 MG tablet Take 50 mg by mouth daily.  01/08/18  Yes [provider]  magnesium oxide (MAG-OX) 400 MG tablet Take 400 mg by mouth daily.   Yes [provider]  metFORMIN (GLUCOPHAGE) 1000 MG tablet Take 1,000 mg by mouth 2 (two) times daily with a meal.  11/13/17  Yes [provider]  Multiple Vitamin (MULTI-VITAMINS) TABS Take 1 tablet by mouth daily.  Yes [provider]  Omega-3 1000 MG CAPS Take 1,200 mg by mouth 2 (two) times daily.    Yes [provider]  pioglitazone (ACTOS) 45 MG tablet Take 45 mg by mouth daily.  02/06/18  Yes [provider]  pravastatin (PRAVACHOL) 40 MG tablet Take 40 mg by mouth daily.  01/08/18  Yes [provider]  prochlorperazine (COMPAZINE) 10 MG tablet TAKE 1 TABLET BY MOUTH EVERY 6 HOURS AS NEEDED FOR NAUSEA OR VOMITING Patient taking differently: Take 10 mg by mouth every 6 (six) hours as needed for nausea or vomiting.  02/21/18  Yes Curt Bears, MD   traMADol (ULTRAM) 50 MG tablet Take 1 tablet (50 mg total) by mouth every 6 (six) hours as needed for severe pain. 06/23/18  Yes Curt Bears, MD  ASTAXANTHIN PO Take 1 capsule by mouth daily.     [provider]  Blood Glucose Monitoring Suppl (ONE TOUCH ULTRA MINI) w/Device KIT 1 kit by Other route once.     [provider]  glucose blood (PRECISION QID TEST) test strip by Misc.(Non-Drug; Combo Route) route.    [provider]  Insulin Pen Needle (FIFTY50 PEN NEEDLES) 31G X 8 MM MISC USE AS DIRECTED 06/30/15   [provider]  Lancets MISC by Misc.(Non-Drug; Combo Route) route.    [provider]    Physical Exam: Vitals:   07/17/18 2308  BP: (!) 159/95  Pulse: 80  Resp: 20  Temp: 98.1 F (36.7 C)  TempSrc: Oral  SpO2: 95%    Constitutional: NAD, calm, comfortable Eyes: PERRL, lids and conjunctivae normal ENMT: Mucous membranes are moist. Posterior pharynx clear of any exudate or lesions.Normal dentition.  Neck: normal, supple, no masses, no thyromegaly Respiratory: clear to auscultation bilaterally, no wheezing, no crackles. Normal respiratory effort. No accessory muscle use.  Cardiovascular: Regular rate and rhythm, no murmurs / rubs / gallops. No extremity edema. 2+ pedal pulses. No carotid bruits.  Abdomen: no tenderness, no masses palpated. No hepatosplenomegaly. Bowel sounds positive.  Musculoskeletal: no clubbing / cyanosis. No joint deformity upper and lower extremities. Good ROM, no contractures. Normal muscle tone.  Skin: no rashes, lesions, ulcers. No induration Neurologic: CN 2-12 grossly intact. Sensation intact, DTR normal. Strength 5/5 in all 4.  Psychiatric: Normal judgment and insight. Alert and oriented x 3. Normal mood.    Labs on Admission: I have personally reviewed following labs and imaging studies  CBC: Recent Labs  Lab 07/18/18 0015 07/18/18 0023  WBC 3.5*  --   NEUTROABS 2.2  --   HGB 9.7* 10.2*    HCT 31.7* 30.0*  MCV 97.8  --   PLT 246  --    Basic Metabolic Panel: Recent Labs  Lab 07/18/18 0015 07/18/18 0023  NA 135 135  K 3.4* 3.4*  CL 99 99  CO2 26  --   GLUCOSE 108* 105*  BUN 33* 29*  CREATININE 1.64* 1.60*  CALCIUM 9.5  --    GFR: Estimated Creatinine Clearance: 53 mL/min (A) (by C-G formula based on SCr of 1.6 mg/dL (H)). Liver Function Tests: No results for input(s): AST, ALT, ALKPHOS, BILITOT, PROT, ALBUMIN in the last 168 hours. No results for input(s): LIPASE, AMYLASE in the last 168 hours. No results for input(s): AMMONIA in the last 168 hours. Coagulation Profile: No results for input(s): INR, PROTIME in the last 168 hours. Cardiac Enzymes: No results for input(s): CKTOTAL, CKMB, CKMBINDEX, TROPONINI in the last 168 hours. BNP (last 3  results) No results for input(s): PROBNP in the last 8760 hours. HbA1C: No results for input(s): HGBA1C in the last 72 hours. CBG: No results for input(s): GLUCAP in the last 168 hours. Lipid Profile: No results for input(s): CHOL, HDL, LDLCALC, TRIG, CHOLHDL, LDLDIRECT in the last 72 hours. Thyroid Function Tests: No results for input(s): TSH, T4TOTAL, FREET4, T3FREE, THYROIDAB in the last 72 hours. Anemia Panel: No results for input(s): VITAMINB12, FOLATE, FERRITIN, TIBC, IRON, RETICCTPCT in the last 72 hours. Urine analysis: No results found for: COLORURINE, APPEARANCEUR, [place], [place], [place], [place], [place], [place], [place], [place], [place], LEUKOCYTESUR  Radiological Exams on Admission: Dg Chest 2 View  Result Date: 07/18/2018 CLINICAL DATA:  Cough EXAM: CHEST - 2 VIEW COMPARISON:  CT 05/01/2018, radiograph 12/22/2017 FINDINGS: Left-sided central venous port tip over the SVC. Volume loss in the right thorax. Small bilateral pleural effusions. Increased right upper lobe opacity. Borderline cardiomegaly. No pneumothorax. Degenerative changes of the spine. IMPRESSION: 1. Development of small  bilateral pleural effusions. 2. Volume loss in the right thorax with new tenting of the right diaphragm. Rounded opacity in the right upper lobe may reflect pneumonia or recurrent mass lesion. Contrast-enhanced CT is suggested for further evaluation. Electronically Signed   By: Donavan Foil M.D.   On: 07/18/2018 00:03   Ct Chest W Contrast  Result Date: 07/18/2018 CLINICAL DATA:  Productive cough with hemoptysis. Treatment for lung cancer July, 2019 with radiation and chemotherapy. EXAM: CT CHEST WITH CONTRAST TECHNIQUE: Multidetector CT imaging of the chest was performed during intravenous contrast administration. CONTRAST:  88m OMNIPAQUE IOHEXOL 300 MG/ML  SOLN COMPARISON:  07/01/2018 CT FINDINGS: Cardiovascular: Normal heart size with trace pericardial effusion. 4.4 cm ascending thoracic aortic aneurysm with atherosclerosis. No dissection. No large central pulmonary embolus. Patent great vessels with atherosclerosis. Scattered coronary arteriosclerosis along the LAD and left circumflex. Mediastinum/Nodes: No enlarged mediastinal, hilar, or axillary lymph nodes. Thyroid gland, trachea, and esophagus demonstrate no significant findings. Lungs/Pleura: Bilateral small to moderate pleural effusions. New masslike pulmonary consolidations with air bronchograms in the right upper lobe and superior segment of right lower lobe. Volume loss with mild tenting along the inferior pulmonary ligament on the right. Consolidation and atelectasis noted at the left lung base laterally. Upper Abdomen: No acute abnormality. Musculoskeletal: No chest wall abnormality. Accentuated thoracic curvature due to multilevel degenerative disc disease. Diffuse idiopathic skeletal hyperostosis with flowing anterior osteophytes are noted. IMPRESSION: 1. New masslike pulmonary consolidations with air bronchograms in the right upper lobe and superior segment of right lower lobe with volume loss and mild tenting along the inferior pulmonary  ligament. Findings are in keeping with multifocal pneumonia. Followup PA and lateral chest X-ray is recommended in 3-4 weeks following trial of antibiotic therapy to ensure resolution and exclude recurrence of malignancy. 2. Small to moderate bilateral pleural effusions. 3. 4.4 cm ascending thoracic aortic aneurysm. Recommend annual imaging followup by CTA or MRA. This recommendation follows 2010 ACCF/AHA/AATS/ACR/ASA/SCA/SCAI/SIR/STS/SVM Guidelines for the Diagnosis and Management of Patients with Thoracic Aortic Disease. 2010; 121: [id number] 4. Coronary arteriosclerosis. Aortic Atherosclerosis (ICD10-I70.0). Electronically Signed   By: DAshley RoyaltyM.D.   On: 07/18/2018 01:46    EKG: Independently reviewed.  Assessment/Plan Principal Problem:   Hemoptysis Active Problems:   DM2 (diabetes mellitus, type 2) (HCC)   HYPERTENSION, BENIGN   Stage III squamous cell cancer   CKD (chronic kidney disease) stage 3, GFR 30-59 ml/min (HCC)    1. Hemoptysis - RUL PNA vs recurrent NSCLC, radiologist favors PNA at  this time 1. PNA pathway 2. Empiric rocephin / azithromycin 3. Culture pending 2. H/o stage 3 NSCLC of RUL - 1. Consolidation immunotherapy with Imfinzi 65m/kg every 2 weeks.  Just had cycle 4. 2. Induction treatment was chemoradiation with partial response. 3. CKD stage 3 - chronic and stable 4. DM2 - 1. Cont levemir 10 2. Hold home PO hypoglycemics 3. Sensitive SSI AC 5. HTN - continue home BP meds  DVT prophylaxis: SCDs Code Status: Full Family Communication: Family at bedside Disposition Plan: Home after admit Consults called: None, put in IP consult to oncology in epic [place] [facility] Pager [phone] works nights!  If 7AM-7PM, please contact the primary day team physician taking care of patient  [email or web address] Password [email or web address] 07/18/2018, 2:48 AM

## 2018-07-19 DIAGNOSIS — J181 Lobar pneumonia, unspecified organism: Secondary | ICD-10-CM | POA: Diagnosis not present

## 2018-07-19 DIAGNOSIS — C3490 Malignant neoplasm of unspecified part of unspecified bronchus or lung: Secondary | ICD-10-CM

## 2018-07-19 DIAGNOSIS — N183 Chronic kidney disease, stage 3 (moderate): Secondary | ICD-10-CM | POA: Diagnosis not present

## 2018-07-19 DIAGNOSIS — Z794 Long term (current) use of insulin: Secondary | ICD-10-CM | POA: Diagnosis not present

## 2018-07-19 DIAGNOSIS — E1122 Type 2 diabetes mellitus with diabetic chronic kidney disease: Secondary | ICD-10-CM | POA: Diagnosis not present

## 2018-07-19 LAB — GLUCOSE, CAPILLARY
Glucose-Capillary: 106 mg/dL — ABNORMAL HIGH (ref 70–99)
Glucose-Capillary: 132 mg/dL — ABNORMAL HIGH (ref 70–99)

## 2018-07-19 LAB — CBC
HCT: 29.7 % — ABNORMAL LOW (ref 39.0–52.0)
Hemoglobin: 9.2 g/dL — ABNORMAL LOW (ref 13.0–17.0)
MCH: 30 pg (ref 26.0–34.0)
MCHC: 31 g/dL (ref 30.0–36.0)
MCV: 96.7 fL (ref 80.0–100.0)
Platelets: 245 10*3/uL (ref 150–400)
RBC: 3.07 MIL/uL — ABNORMAL LOW (ref 4.22–5.81)
RDW: 14.6 % (ref 11.5–15.5)
WBC: 3.6 10*3/uL — ABNORMAL LOW (ref 4.0–10.5)
nRBC: 0 % (ref 0.0–0.2)

## 2018-07-19 MED ORDER — HEPARIN SOD (PORK) LOCK FLUSH 100 UNIT/ML IV SOLN
500.0000 [IU] | INTRAVENOUS | Status: AC | PRN
  Administered 2018-07-19: 500 [IU]

## 2018-07-19 MED ORDER — CEFUROXIME AXETIL 500 MG PO TABS: 500.0000 mg | ORAL_TABLET | Freq: Two times a day (BID) | ORAL | 0 refills | Status: AC

## 2018-07-19 MED ORDER — AZITHROMYCIN 250 MG PO TABS: 250.0000 mg | ORAL_TABLET | Freq: Every day | ORAL | 0 refills | Status: AC

## 2018-07-19 NOTE — Discharge Summary (Signed)
Physician Discharge Summary  Darrell [name] WGY:[id number] DOB: 18-Mar-1946 DOA: 07/17/2018  PCP: [name], MD  Admit date: 07/17/2018 Discharge date: 07/19/2018  Recommendations for Outpatient Follow-up:   Multilobar pneumonia with small volume hemoptysis, complicated by known non-small cell lung cancer.    4.4 cm ascending thoracic aortic aneurysm.  Recommend annual imaging by CTA or MRA.   Follow-up Information    [name], MD. Schedule an appointment as soon as possible for a visit in 1 week(s).   Specialty:  Internal Medicine Contact information: [address] [place] [address] [phone]            Discharge Diagnoses: Principal diagnosis is #1 1. Multilobar pneumonia with small volume hemoptysis, complicated by known non-small cell lung cancer 2. Stage IIb/IIIa non-small cell lung cancer 3. 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 4.4 cm ascending thoracic aortic aneurysm 5. Aortic atherosclerosis  Discharge Condition: improved Disposition: home  Diet recommendation: heart healthy, diabetic diet  Filed Weights   07/18/18 2000  Weight: 113.3 kg    History of present illness:  73 year old man PMH stage IIb/IIIa non-small cell lung cancer presented with acute onset of hemoptysis.  Imaging was more suggestive of pneumonia rather than tumor.  He was admitted for further treatment.  Hospital Course:  Patient improved with empiric antibiotics, small-volume hemoptysis resolved with antibiotics and hemoglobin remained stable.  No hypoxia noted.  He was seen by oncology who concurred with treatment for pneumonia.  He will follow-up with Dr. Earlie Server in the outpatient setting.  Hospitalization was uncomplicated.  Individual issues as below.  Multilobar pneumonia with small volume hemoptysis, complicated by known non-small cell lung cancer.  Imaging felt to reflect pneumonia rather than progression of disease. --Hemoptysis  appears resolved.  Afebrile, hemodynamic stable, no hypoxia. --Continue empiric antibiotics --Appreciate Dr. 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  Currently undergoing chemotherapy. 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 --Creatinine stable.    4.4 cm ascending thoracic aortic aneurysm.  Recommend annual imaging by CTA or MRA. --Incidental finding.  Follow-up as an outpatient.  Aortic atherosclerosis --Continue statin  Today's assessment: See progress note same day  Discharge Instructions  Discharge Instructions    Diet - low sodium heart healthy   Complete by:  As directed    Diet Carb Modified   Complete by:  As directed    Discharge instructions   Complete by:  As directed    Call your physician or seek immediate medical attention for fever, cough, bleeding, shortness of breath or worsening of condition.   Increase activity slowly   Complete by:  As directed      Allergies as of 07/19/2018      Reactions   Lisinopril Cough         Medication List    STOP taking these medications   ONE TOUCH ULTRA MINI w/Device Kit     TAKE these medications   ASTAXANTHIN PO Take 1 capsule by mouth daily.   azithromycin 250 MG tablet Commonly known as:  ZITHROMAX Take 1 tablet (250 mg total) by mouth at bedtime for 4 days.   cefUROXime 500 MG tablet Commonly known as:  CEFTIN Take 1 tablet (500 mg total) by mouth 2 (two) times daily for 5 days.   Cinnamon Bark Powd Take 1,000 mg by mouth 2 (two) times daily.  cloNIDine 0.1 MG tablet Commonly known as:  CATAPRES Take 0.1 mg by mouth 2 (two) times daily.   COREG 6.25 MG tablet Generic drug:  carvedilol Take 6.25 mg by mouth 2 (two) times daily with a meal.   escitalopram 10 MG tablet Commonly known as:  LEXAPRO Take 10 mg by mouth daily.     fenofibrate 160 MG tablet Take 160 mg by mouth daily.   FIFTY50 PEN NEEDLES 31G X 8 MM Misc Generic drug:  Insulin Pen Needle USE AS DIRECTED   Glucosamine Sulfate 1000 MG Tabs Take 2,000 mg by mouth daily.   hydrochlorothiazide 25 MG tablet Commonly known as:  HYDRODIURIL Take 25 mg by mouth daily.   Lancets Misc by Misc.(Non-Drug; Combo Route) route.   LEVEMIR FLEXTOUCH 100 UNIT/ML Pen Generic drug:  Insulin Detemir Inject 10 Units into the skin daily.   lidocaine-prilocaine cream Commonly known as:  EMLA Apply 1 application topically as needed. What changed:  reasons to take this   losartan 50 MG tablet Commonly known as:  COZAAR Take 50 mg by mouth daily.   magnesium oxide 400 MG tablet Commonly known as:  MAG-OX Take 400 mg by mouth daily.   metFORMIN 1000 MG tablet Commonly known as:  GLUCOPHAGE Take 1,000 mg by mouth 2 (two) times daily with a meal.   MULTI-VITAMINS Tabs Take 1 tablet by mouth daily.   Omega-3 1000 MG Caps Take 1,200 mg by mouth 2 (two) times daily.   pioglitazone 45 MG tablet Commonly known as:  ACTOS Take 45 mg by mouth daily.   pravastatin 40 MG tablet Commonly known as:  PRAVACHOL Take 40 mg by mouth daily.   PRECISION QID TEST test strip Generic drug:  glucose blood by Misc.(Non-Drug; Combo Route) route.   prochlorperazine 10 MG tablet Commonly known as:  COMPAZINE TAKE 1 TABLET BY MOUTH EVERY 6 HOURS AS NEEDED FOR NAUSEA OR VOMITING What changed:  See the new instructions.   traMADol 50 MG tablet Commonly known as:  ULTRAM Take 1 tablet (50 mg total) by mouth every 6 (six) hours as needed for severe pain.      Allergies  Allergen Reactions  . Lisinopril Cough         The results of significant diagnostics from this hospitalization (including imaging, microbiology, ancillary and laboratory) are listed below for reference.    Significant Diagnostic Studies: Dg Chest 2 View  Result Date: 07/18/2018 CLINICAL  DATA:  Cough EXAM: CHEST - 2 VIEW COMPARISON:  CT 05/01/2018, radiograph 12/22/2017 FINDINGS: Left-sided central venous port tip over the SVC. Volume loss in the right thorax. Small bilateral pleural effusions. Increased right upper lobe opacity. Borderline cardiomegaly. No pneumothorax. Degenerative changes of the spine. IMPRESSION: 1. Development of small bilateral pleural effusions. 2. Volume loss in the right thorax with new tenting of the right diaphragm. Rounded opacity in the right upper lobe may reflect pneumonia or recurrent mass lesion. Contrast-enhanced CT is suggested for further evaluation. Electronically Signed   By: Donavan Foil M.D.   On: 07/18/2018 00:03   Ct Chest W Contrast  Result Date: 07/18/2018 CLINICAL DATA:  Productive cough with hemoptysis. Treatment for lung cancer July, 2019 with radiation and chemotherapy. EXAM: CT CHEST WITH CONTRAST TECHNIQUE: Multidetector CT imaging of the chest was performed during intravenous contrast administration. CONTRAST:  46m OMNIPAQUE IOHEXOL 300 MG/ML  SOLN COMPARISON:  07/01/2018 CT FINDINGS: Cardiovascular: Normal heart size with trace pericardial effusion. 4.4 cm ascending thoracic aortic aneurysm with atherosclerosis. No  dissection. No large central pulmonary embolus. Patent great vessels with atherosclerosis. Scattered coronary arteriosclerosis along the LAD and left circumflex. Mediastinum/Nodes: No enlarged mediastinal, hilar, or axillary lymph nodes. Thyroid gland, trachea, and esophagus demonstrate no significant findings. Lungs/Pleura: Bilateral small to moderate pleural effusions. New masslike pulmonary consolidations with air bronchograms in the right upper lobe and superior segment of right lower lobe. Volume loss with mild tenting along the inferior pulmonary ligament on the right. Consolidation and atelectasis noted at the left lung base laterally. Upper Abdomen: No acute abnormality. Musculoskeletal: No chest wall abnormality.  Accentuated thoracic curvature due to multilevel degenerative disc disease. Diffuse idiopathic skeletal hyperostosis with flowing anterior osteophytes are noted. IMPRESSION: 1. New masslike pulmonary consolidations with air bronchograms in the right upper lobe and superior segment of right lower lobe with volume loss and mild tenting along the inferior pulmonary ligament. Findings are in keeping with multifocal pneumonia. Followup PA and lateral chest X-ray is recommended in 3-4 weeks following trial of antibiotic therapy to ensure resolution and exclude recurrence of malignancy. 2. Small to moderate bilateral pleural effusions. 3. 4.4 cm ascending thoracic aortic aneurysm. Recommend annual imaging followup by CTA or MRA. This recommendation follows 2010 ACCF/AHA/AATS/ACR/ASA/SCA/SCAI/SIR/STS/SVM Guidelines for the Diagnosis and Management of Patients with Thoracic Aortic Disease. 2010; 121: T614-E315. 4. Coronary arteriosclerosis. Aortic Atherosclerosis (ICD10-I70.0). Electronically Signed   By: Ashley Royalty M.D.   On: 07/18/2018 01:46    Microbiology: Recent Results (from the past 240 hour(s))  Culture, blood (routine x 2) Call MD if unable to obtain prior to antibiotics being given     Status: None (Preliminary result)   Collection Time: 07/18/18  7:53 AM  Result Value Ref Range Status   Specimen Description BLOOD PORTA CATH  Final   Special Requests   Final    BOTTLES DRAWN AEROBIC AND ANAEROBIC Blood Culture results may not be optimal due to an excessive volume of blood received in culture bottles   Culture   Final    NO GROWTH < 24 HOURS Performed at [facility], [place] [address], [place], [place] [address]    Report Status PENDING  Incomplete     Labs: Basic Metabolic Panel: Recent Labs  Lab 07/18/18 0015 07/18/18 0023  NA 135 135  K 3.4* 3.4*  CL 99 99  CO2 26  --   GLUCOSE 108* 105*  BUN 33* 29*  CREATININE 1.64* 1.60*  CALCIUM 9.5  --   CBC: Recent Labs  Lab  07/18/18 0015 07/18/18 0023 07/19/18 1026  WBC 3.5*  --  3.6*  NEUTROABS 2.2  --   --   HGB 9.7* 10.2* 9.2*  HCT 31.7* 30.0* 29.7*  MCV 97.8  --  96.7  PLT 246  --  245    CBG: Recent Labs  Lab 07/18/18 1215 07/18/18 1642 07/18/18 2135 07/19/18 0805 07/19/18 1152  GLUCAP 118* 134* 157* 106* 132*    Principal Problem:   Lobar pneumonia (HCC) Active Problems:   DM2 (diabetes mellitus, type 2) (HCC)   HYPERTENSION, BENIGN   Stage III squamous cell cancer   Hemoptysis   CKD (chronic kidney disease) stage 3, GFR 30-59 ml/min (HCC)   Thoracic aortic aneurysm without rupture ([place])   Aortic atherosclerosis ([place])   Time coordinating discharge: 35 minutes  Signed:  Murray Hodgkins, MD [facility] 07/19/2018, 1:16 PM

## 2018-07-19 NOTE — Progress Notes (Signed)
PROGRESS NOTE  Darrell [name] QIH:[id number] DOB: [birth year] DOA: 07/17/2018 PCP: [name], MD  Brief History   73 year old man PMH stage IIb/IIIa non-small cell lung cancer presented with acute onset of hemoptysis.  Imaging was more suggestive of pneumonia rather than tumor.  He was admitted for further treatment.  A & P  Multilobar pneumonia with small volume hemoptysis, complicated by known non-small cell lung cancer.  Imaging felt to reflect pneumonia rather than progression of disease. --Hemoptysis appears resolved.  Afebrile, hemodynamic stable, no hypoxia. --Continue empiric antibiotics, repeat CBC --Appreciate Dr. 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  Currently undergoing chemotherapy. 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 --Creatinine stable.    4.4 cm ascending thoracic aortic aneurysm.  Recommend annual imaging by CTA or MRA. --Incidental finding.  Follow-up as an outpatient.  Aortic atherosclerosis --Continue statin   Overall better, will repeat CBC, if hemoglobin stable and feeling well, home this afternoon.  DVT prophylaxis: SCDs Code Status: Full Family Communication: wife at bedside 1/8 Disposition Plan: home    Murray Hodgkins, MD  [facility] Direct contact: [phone] --[place]ia [place]  --[email or web address]; password [email or web address]  7PM-7AM contact night coverage as above 07/19/2018, 9:48 AM  LOS: 0 days   Consultants  . Oncology  Procedures  .   Antibiotics  . Ceftriaxone 1/6 > . Azithromycin 1/6 >  Interval History/Subjective  No hemoptysis overnight over this a.m. and very little hemoptysis yesterday afternoon.  Breathing better.  Ambulating to the bathroom without difficulty.  Tolerating diet.  Objective   Vitals:  Vitals:   07/18/18 2134 07/19/18 0629  BP: (!) 141/84  140/78  Pulse: (!) 50 66  Resp: 20 16  Temp: 98.2 F (36.8 C) (!) 97.5 F (36.4 C)  SpO2: 97% 95%    Exam:  Constitutional:   . Appears calm and comfortable.  Appears better today. Eyes:  . pupils and irises appear normal . Normal lids ENMT:  . grossly normal hearing  Respiratory:  . Diminished breath sounds posteriorly, some rhonchi on the right posteriorly. Marland Kitchen Respiratory effort mildly increased.  Cardiovascular:  . RRR, no m/r/g . No LE extremity edema   Abdomen:  . Soft Psychiatric:  . Mental status o Mood, affect appropriate  I have personally reviewed the following:   Today's Data  .   Lab Data   Strep pneumoniae urinary antigen negative  Micro Data  . 1/7 blood cultures  Imaging since admission  . 1/7 CT chest new pulmonary consolidations right upper lobe, right lower lobe suggestive of multifocal pneumonia.  4.4 cm ascending thoracic aortic aneurysm. . 1/7 chest x-ray independently reviewed showed right upper lobe opacity suggestive of pneumonia.  Cardiology Data  .   Other Data  .   Scheduled Meds: . azithromycin  500 mg Oral Q24H  . carvedilol  6.25 mg Oral BID WC  . cloNIDine  0.1 mg Oral BID  . escitalopram  10 mg Oral Daily  . fenofibrate  160 mg Oral Daily  . hydrochlorothiazide  25 mg Oral Daily  . insulin aspart  0-9 Units Subcutaneous TID WC  . insulin detemir  10 Units Subcutaneous Daily  . losartan  50 mg Oral Daily  . magnesium oxide  400 mg Oral Daily  . multivitamin with minerals  1 tablet Oral Daily  . omega-3 acid ethyl esters  1  g Oral BID  . pravastatin  40 mg Oral Daily   Continuous Infusions: . cefTRIAXone (ROCEPHIN)  IV 1 g (07/19/18 0228)    Principal Problem:   Lobar pneumonia (HCC) Active Problems:   DM2 (diabetes mellitus, type 2) (HCC)   HYPERTENSION, BENIGN   Stage III squamous cell cancer   Hemoptysis   CKD (chronic kidney disease) stage 3, GFR 30-59 ml/min (HCC)   Thoracic aortic aneurysm without  rupture ([place])   Aortic atherosclerosis ([place])   LOS: 0 days

## 2018-07-19 NOTE — Progress Notes (Signed)
Subjective: The patient is seen and examined today.  His wife was at the bedside.  This is a very pleasant 73 years old white male with a stage IIIa non-small cell lung cancer, squamous cell carcinoma presented with large right hilar mass and questionable mediastinal invasion diagnosed in July 2019 status post induction treatment with concurrent chemoradiation with partial response.  The patient is currently undergoing consolidation treatment with immunotherapy with Imfinzi status post 4 cycles.  Last cycle was giving 10 days ago.  He was admitted recently with hemoptysis and shortness of breath.  CT scan of the chest showed new masslike pulmonary consolidation with air bronchogram in the right upper lobe and superior segment of the right lower lobe with volume loss.  These findings are highly suggestive of pneumonia rather than progression of his disease.  The patient was a started on IV Rocephin as well as oral Zithromax.  He is feeling a little bit better.  He denied having any significant hemoptysis.  He has no current fever or chills.  He continues to have mild shortness of breath.  Objective: Vital signs in last 24 hours: Temp:  [97.5 F (36.4 C)-98.2 F (36.8 C)] 97.5 F (36.4 C) (01/08 0629) Pulse Rate:  [50-86] 66 (01/08 0629) Resp:  [16-20] 16 (01/08 0629) BP: (140-156)/(78-92) 140/78 (01/08 0629) SpO2:  [94 %-97 %] 95 % (01/08 0629) Weight:  [249 lb 12.5 oz (113.3 kg)] 249 lb 12.5 oz (113.3 kg) (01/07 2000)  Intake/Output from previous day: 01/07 0701 - 01/08 0700 In: 96.9 [IV Piggyback:96.9] Out: -  Intake/Output this shift: No intake/output data recorded.  General appearance: alert, cooperative and no distress Resp: diminished breath sounds RUL and dullness to percussion RUL Cardio: regular rate and rhythm, S1, S2 normal, no murmur, click, rub or gallop GI: soft, non-tender; bowel sounds normal; no masses,  no organomegaly Extremities: extremities normal, atraumatic, no  cyanosis or edema  Lab Results:  Recent Labs    07/18/18 0015 07/18/18 0023  WBC 3.5*  --   HGB 9.7* 10.2*  HCT 31.7* 30.0*  PLT 246  --    BMET Recent Labs    07/18/18 0015 07/18/18 0023  NA 135 135  K 3.4* 3.4*  CL 99 99  CO2 26  --   GLUCOSE 108* 105*  BUN 33* 29*  CREATININE 1.64* 1.60*  CALCIUM 9.5  --     Studies/Results: Dg Chest 2 View  Result Date: 07/18/2018 CLINICAL DATA:  Cough EXAM: CHEST - 2 VIEW COMPARISON:  CT 05/01/2018, radiograph 12/22/2017 FINDINGS: Left-sided central venous port tip over the SVC. Volume loss in the right thorax. Small bilateral pleural effusions. Increased right upper lobe opacity. Borderline cardiomegaly. No pneumothorax. Degenerative changes of the spine. IMPRESSION: 1. Development of small bilateral pleural effusions. 2. Volume loss in the right thorax with new tenting of the right diaphragm. Rounded opacity in the right upper lobe may reflect pneumonia or recurrent mass lesion. Contrast-enhanced CT is suggested for further evaluation. Electronically Signed   By: Donavan Foil M.D.   On: 07/18/2018 00:03   Ct Chest W Contrast  Result Date: 07/18/2018 CLINICAL DATA:  Productive cough with hemoptysis. Treatment for lung cancer July, 2019 with radiation and chemotherapy. EXAM: CT CHEST WITH CONTRAST TECHNIQUE: Multidetector CT imaging of the chest was performed during intravenous contrast administration. CONTRAST:  20mL OMNIPAQUE IOHEXOL 300 MG/ML  SOLN COMPARISON:  07/01/2018 CT FINDINGS: Cardiovascular: Normal heart size with trace pericardial effusion. 4.4 cm ascending thoracic aortic aneurysm with  atherosclerosis. No dissection. No large central pulmonary embolus. Patent great vessels with atherosclerosis. Scattered coronary arteriosclerosis along the LAD and left circumflex. Mediastinum/Nodes: No enlarged mediastinal, hilar, or axillary lymph nodes. Thyroid gland, trachea, and esophagus demonstrate no significant findings. Lungs/Pleura:  Bilateral small to moderate pleural effusions. New masslike pulmonary consolidations with air bronchograms in the right upper lobe and superior segment of right lower lobe. Volume loss with mild tenting along the inferior pulmonary ligament on the right. Consolidation and atelectasis noted at the left lung base laterally. Upper Abdomen: No acute abnormality. Musculoskeletal: No chest wall abnormality. Accentuated thoracic curvature due to multilevel degenerative disc disease. Diffuse idiopathic skeletal hyperostosis with flowing anterior osteophytes are noted. IMPRESSION: 1. New masslike pulmonary consolidations with air bronchograms in the right upper lobe and superior segment of right lower lobe with volume loss and mild tenting along the inferior pulmonary ligament. Findings are in keeping with multifocal pneumonia. Followup PA and lateral chest X-ray is recommended in 3-4 weeks following trial of antibiotic therapy to ensure resolution and exclude recurrence of malignancy. 2. Small to moderate bilateral pleural effusions. 3. 4.4 cm ascending thoracic aortic aneurysm. Recommend annual imaging followup by CTA or MRA. This recommendation follows 2010 ACCF/AHA/AATS/ACR/ASA/SCA/SCAI/SIR/STS/SVM Guidelines for the Diagnosis and Management of Patients with Thoracic Aortic Disease. 2010; 121: O962-X528. 4. Coronary arteriosclerosis. Aortic Atherosclerosis (ICD10-I70.0). Electronically Signed   By: Ashley Royalty M.D.   On: 07/18/2018 01:46    Medications: I have reviewed the patient's current medications.  Assessment/Plan: This is a very pleasant 73 years old white male with stage IIIa non-small cell lung cancer status post induction concurrent chemoradiation and currently undergoing consolidation immunotherapy with Imfinzi status post 4 cycles.  He has been tolerating his treatment with immunotherapy well except for shortness of breath that was concerning for immunotherapy mediated pneumonitis but the patient  tolerated the last cycle of his treatment well. He is admitted to [facility] with shortness of breath and hemoptysis and the finding on the CT scan are more consistent with lobar pneumonia than disease progression. I discussed the scan results with the patient and his wife and recommended for him to continue with the course of antibiotics with IV Rocephin and this Romycin for now.  After the patient feels better he could be discharged home on oral Levaquin.  The patient is scheduled for treatment next week.  I will reevaluate him in the office before resuming his treatment with immunotherapy. Thank you for taking good care of Mr. Statzer.  I will continue to follow up the patient with you and assist in his management on as-needed basis.  LOS: 0 days    Eilleen Kempf 07/19/2018

## 2018-07-19 NOTE — Care Management Note (Signed)
Case Management Note  Patient Details  Name: Darrell [name] MRN: [id number] Date of Birth: [birth year]  Subjective/Objective:                  discharged  Action/Plan: Discharged to home with self-care, orders checked for hhc needs. No CM needs present at time of discharge.  Patient is able to arrangement own appointments and home care.  Expected Discharge Date:  07/19/18               Expected Discharge Plan:  Home/Self Care  In-House Referral:     Discharge planning Services  CM Consult  Post Acute Care Choice:    Choice offered to:     DME Arranged:    DME Agency:     HH Arranged:    HH Agency:     Status of Service:  Completed, signed off  If discussed at [employer] of Stay Meetings, dates discussed:    Additional Comments:  Leeroy Cha, RN 07/19/2018, 1:12 PM

## 2018-07-23 LAB — CULTURE, BLOOD (ROUTINE X 2): Culture: NO GROWTH

## 2018-07-24 ENCOUNTER — Inpatient Hospital Stay: Payer: Medicare Other

## 2018-07-24 ENCOUNTER — Inpatient Hospital Stay: Payer: Medicare Other | Attending: Internal Medicine

## 2018-07-24 ENCOUNTER — Encounter: Payer: Self-pay | Admitting: Internal Medicine

## 2018-07-24 ENCOUNTER — Inpatient Hospital Stay (HOSPITAL_BASED_OUTPATIENT_CLINIC_OR_DEPARTMENT_OTHER): Payer: Medicare Other | Admitting: Internal Medicine

## 2018-07-24 VITALS — BP 125/73 | HR 83 | Temp 97.6°F | Resp 18 | Ht 68.0 in | Wt 252.6 lb

## 2018-07-24 DIAGNOSIS — Z95828 Presence of other vascular implants and grafts: Secondary | ICD-10-CM

## 2018-07-24 DIAGNOSIS — J90 Pleural effusion, not elsewhere classified: Secondary | ICD-10-CM | POA: Diagnosis not present

## 2018-07-24 DIAGNOSIS — I700 Atherosclerosis of aorta: Secondary | ICD-10-CM | POA: Insufficient documentation

## 2018-07-24 DIAGNOSIS — I129 Hypertensive chronic kidney disease with stage 1 through stage 4 chronic kidney disease, or unspecified chronic kidney disease: Secondary | ICD-10-CM | POA: Diagnosis not present

## 2018-07-24 DIAGNOSIS — I10 Essential (primary) hypertension: Secondary | ICD-10-CM

## 2018-07-24 DIAGNOSIS — J181 Lobar pneumonia, unspecified organism: Secondary | ICD-10-CM

## 2018-07-24 DIAGNOSIS — E785 Hyperlipidemia, unspecified: Secondary | ICD-10-CM | POA: Diagnosis not present

## 2018-07-24 DIAGNOSIS — Z5112 Encounter for antineoplastic immunotherapy: Secondary | ICD-10-CM

## 2018-07-24 DIAGNOSIS — I712 Thoracic aortic aneurysm, without rupture: Secondary | ICD-10-CM

## 2018-07-24 DIAGNOSIS — R042 Hemoptysis: Secondary | ICD-10-CM | POA: Insufficient documentation

## 2018-07-24 DIAGNOSIS — N183 Chronic kidney disease, stage 3 (moderate): Secondary | ICD-10-CM | POA: Diagnosis not present

## 2018-07-24 DIAGNOSIS — Z923 Personal history of irradiation: Secondary | ICD-10-CM | POA: Insufficient documentation

## 2018-07-24 DIAGNOSIS — I2510 Atherosclerotic heart disease of native coronary artery without angina pectoris: Secondary | ICD-10-CM | POA: Diagnosis not present

## 2018-07-24 DIAGNOSIS — J449 Chronic obstructive pulmonary disease, unspecified: Secondary | ICD-10-CM | POA: Diagnosis not present

## 2018-07-24 DIAGNOSIS — Z79899 Other long term (current) drug therapy: Secondary | ICD-10-CM | POA: Diagnosis not present

## 2018-07-24 DIAGNOSIS — C3411 Malignant neoplasm of upper lobe, right bronchus or lung: Secondary | ICD-10-CM

## 2018-07-24 DIAGNOSIS — Z794 Long term (current) use of insulin: Secondary | ICD-10-CM

## 2018-07-24 DIAGNOSIS — R05 Cough: Secondary | ICD-10-CM

## 2018-07-24 DIAGNOSIS — Z9221 Personal history of antineoplastic chemotherapy: Secondary | ICD-10-CM | POA: Diagnosis not present

## 2018-07-24 DIAGNOSIS — N189 Chronic kidney disease, unspecified: Secondary | ICD-10-CM

## 2018-07-24 LAB — CBC WITH DIFFERENTIAL (CANCER CENTER ONLY)
Abs Immature Granulocytes: 0.01 10*3/uL (ref 0.00–0.07)
Basophils Absolute: 0.1 10*3/uL (ref 0.0–0.1)
Basophils Relative: 1 %
Eosinophils Absolute: 0.3 10*3/uL (ref 0.0–0.5)
Eosinophils Relative: 6 %
HCT: 28.4 % — ABNORMAL LOW (ref 39.0–52.0)
Hemoglobin: 8.9 g/dL — ABNORMAL LOW (ref 13.0–17.0)
Immature Granulocytes: 0 %
Lymphocytes Relative: 14 %
Lymphs Abs: 0.5 10*3/uL — ABNORMAL LOW (ref 0.7–4.0)
MCH: 29.9 pg (ref 26.0–34.0)
MCHC: 31.3 g/dL (ref 30.0–36.0)
MCV: 95.3 fL (ref 80.0–100.0)
Monocytes Absolute: 0.3 10*3/uL (ref 0.1–1.0)
Monocytes Relative: 7 %
Neutro Abs: 2.8 10*3/uL (ref 1.7–7.7)
Neutrophils Relative %: 72 %
Platelet Count: 190 10*3/uL (ref 150–400)
RBC: 2.98 MIL/uL — ABNORMAL LOW (ref 4.22–5.81)
RDW: 14.7 % (ref 11.5–15.5)
WBC Count: 3.9 10*3/uL — ABNORMAL LOW (ref 4.0–10.5)
nRBC: 0 % (ref 0.0–0.2)

## 2018-07-24 LAB — CMP (CANCER CENTER ONLY)
ALT: 11 U/L (ref 0–44)
AST: 13 U/L — ABNORMAL LOW (ref 15–41)
Albumin: 3.4 g/dL — ABNORMAL LOW (ref 3.5–5.0)
Alkaline Phosphatase: 39 U/L (ref 38–126)
Anion gap: 9 (ref 5–15)
BUN: 37 mg/dL — ABNORMAL HIGH (ref 8–23)
CO2: 24 mmol/L (ref 22–32)
Calcium: 9.6 mg/dL (ref 8.9–10.3)
Chloride: 103 mmol/L (ref 98–111)
Creatinine: 1.35 mg/dL — ABNORMAL HIGH (ref 0.61–1.24)
GFR, Est AFR Am: >60 mL/min (ref >60)
GFR, Estimated: 53 mL/min — ABNORMAL LOW (ref >60)
Glucose, Bld: 106 mg/dL — ABNORMAL HIGH (ref 70–99)
Potassium: 4.3 mmol/L (ref 3.5–5.1)
Sodium: 136 mmol/L (ref 135–145)
Total Bilirubin: 0.3 mg/dL (ref 0.3–1.2)
Total Protein: 6.9 g/dL (ref 6.5–8.1)

## 2018-07-24 MED ORDER — SODIUM CHLORIDE 0.9% FLUSH
10.0000 mL | INTRAVENOUS | Status: DC | PRN
  Administered 2018-07-24: 10 mL
  Filled 2018-07-24: qty 10

## 2018-07-24 MED ORDER — HEPARIN SOD (PORK) LOCK FLUSH 100 UNIT/ML IV SOLN
500.0000 [IU] | Freq: Once | INTRAVENOUS | Status: AC
  Administered 2018-07-24: 500 [IU] via INTRAVENOUS
  Filled 2018-07-24: qty 5

## 2018-07-24 MED ORDER — SODIUM CHLORIDE 0.9% FLUSH
10.0000 mL | Freq: Once | INTRAVENOUS | Status: AC
  Administered 2018-07-24: 10 mL via INTRAVENOUS
  Filled 2018-07-24: qty 10

## 2018-07-24 MED ORDER — LEVOFLOXACIN 500 MG PO TABS: 500.0000 mg | ORAL_TABLET | Freq: Every day | ORAL | 0 refills | Status: DC

## 2018-07-24 MED ORDER — METHYLPREDNISOLONE 4 MG PO TBPK: ORAL_TABLET | ORAL | 0 refills | Status: DC

## 2018-07-24 NOTE — Progress Notes (Signed)
[place] [phone]   [phone]  OFFICE PROGRESS NOTE  [name], MD [place] [address]  DIAGNOSIS: Stage IIb/IIIa (T2b, N0/N2, M0), non-small cell lung cancer, squamous cell carcinoma diagnosed in July 2019 and presented with large right hilar mass with questionable mediastinal invasion  PRIOR THERAPY: Concurrent chemoradiation with weekly carboplatin for AUC of 2 and paclitaxel 45 mg/M2. First dose 02/27/2018.Status post 5 cycles.  CURRENT THERAPY:Consolidationimmunotherapy with Imfinzi 10 mg/kg every 2 weeks.First dose given on 05/17/2018.Status post 4 cycles.  INTERVAL HISTORY: Darrell [name] 73 y.o. male returns to the clinic today for follow-up visit accompanied by his wife.  The patient is complaining of persistent cough as well as hemoptysis started 3 weeks ago.  He was admitted to [facility] and treated for pneumonia.  CT scan of the chest at that time showed new masslike pulmonary consolidation with air bronchograms in the right upper lobe and superior segment of right lower lobe with volume loss suspicious for multifocal pneumonia.  The patient was treated with IV Rocephin and azithromycin and discharged home on azithromycin and Ceftin.  He completed this course of outpatient treatment yesterday.  He continues to have dry cough and hemoptysis.  He denied having any current fever or chills.  He has no chest pain, or shortness of breath.  He has no recent weight loss or night sweats.  He has no nausea, vomiting, diarrhea or constipation.  He is here today for evaluation before starting the next cycle of his treatment.  MEDICAL HISTORY: Past Medical History:  Diagnosis Date  . Aortic atherosclerosis ([place]) 07/18/2018  . Cancer ([place])   . Cellulitis of right lower extremity   . Chronic kidney disease   . Diabetes mellitus without complication ([place])   . Dyslipidemia   . Hypertension     . Thoracic aortic aneurysm without rupture ([place]) 07/18/2018    ALLERGIES:  is allergic to lisinopril.  MEDICATIONS:  Current Outpatient Medications  Medication Sig Dispense Refill  . ASTAXANTHIN PO Take 1 capsule by mouth daily.     . carvedilol (COREG) 6.25 MG tablet Take 6.25 mg by mouth 2 (two) times daily with a meal.     . cefUROXime (CEFTIN) 500 MG tablet Take 1 tablet (500 mg total) by mouth 2 (two) times daily for 5 days. 10 tablet 0  . Cinnamon Bark POWD Take 1,000 mg by mouth 2 (two) times daily.     . cloNIDine (CATAPRES) 0.1 MG tablet Take 0.1 mg by mouth 2 (two) times daily.     Marland Kitchen escitalopram (LEXAPRO) 10 MG tablet Take 10 mg by mouth daily.     . fenofibrate 160 MG tablet Take 160 mg by mouth daily.     . Glucosamine Sulfate 1000 MG TABS Take 2,000 mg by mouth daily.     Marland Kitchen glucose blood (PRECISION QID TEST) test strip by Misc.(Non-Drug; Combo Route) route.    . hydrochlorothiazide (HYDRODIURIL) 25 MG tablet Take 25 mg by mouth daily.     . Insulin Detemir (LEVEMIR FLEXTOUCH) 100 UNIT/ML Pen Inject 10 Units into the skin daily.     . Insulin Pen Needle (FIFTY50 PEN NEEDLES) 31G X 8 MM MISC USE AS DIRECTED    . Lancets MISC by Misc.(Non-Drug; Combo Route) route.    . lidocaine-prilocaine (EMLA) cream Apply 1 application topically as needed. (Patient taking differently: Apply 1 application topically as needed (port). ) 30 g 0  .  losartan (COZAAR) 50 MG tablet Take 50 mg by mouth daily.     . magnesium oxide (MAG-OX) 400 MG tablet Take 400 mg by mouth daily.    . metFORMIN (GLUCOPHAGE) 1000 MG tablet Take 1,000 mg by mouth 2 (two) times daily with a meal.     . Multiple Vitamin (MULTI-VITAMINS) TABS Take 1 tablet by mouth daily.     . Omega-3 1000 MG CAPS Take 1,200 mg by mouth 2 (two) times daily.     . pioglitazone (ACTOS) 45 MG tablet Take 45 mg by mouth daily.     . pravastatin (PRAVACHOL) 40 MG tablet Take 40 mg by mouth daily.     . prochlorperazine (COMPAZINE) 10 MG  tablet TAKE 1 TABLET BY MOUTH EVERY 6 HOURS AS NEEDED FOR NAUSEA OR VOMITING (Patient taking differently: Take 10 mg by mouth every 6 (six) hours as needed for nausea or vomiting. ) 385 tablet 0  . traMADol (ULTRAM) 50 MG tablet Take 1 tablet (50 mg total) by mouth every 6 (six) hours as needed for severe pain. 20 tablet 0   No current facility-administered medications for this visit.    Facility-Administered Medications Ordered in Other Visits  Medication Dose Route Frequency Provider Last Rate Last Dose  . sodium chloride flush (NS) 0.9 % injection 10 mL  10 mL Intracatheter PRN Curt Bears, MD   10 mL at 06/28/18 1501  . sodium chloride flush (NS) 0.9 % injection 10 mL  10 mL Intracatheter PRN Curt Bears, MD   10 mL at 07/24/18 2778    SURGICAL HISTORY:  Past Surgical History:  Procedure Laterality Date  . PORTACATH PLACEMENT Left 02/21/2018   Procedure: INSERTION PORT-A-CATH;  Surgeon: Grace Isaac, MD;  Location: [facility] OR;  Service: Thoracic;  Laterality: Left;    REVIEW OF SYSTEMS:  Constitutional: positive for fatigue Eyes: negative Ears, nose, mouth, throat, and face: negative Respiratory: positive for cough and hemoptysis Cardiovascular: negative Gastrointestinal: negative Genitourinary:negative Integument/breast: negative Hematologic/lymphatic: negative Musculoskeletal:negative Neurological: negative Behavioral/Psych: negative Endocrine: negative Allergic/Immunologic: negative   PHYSICAL EXAMINATION: General appearance: alert, cooperative, fatigued and no distress Head: Normocephalic, without obvious abnormality, atraumatic Neck: no adenopathy, no JVD, supple, symmetrical, trachea midline and thyroid not enlarged, symmetric, no tenderness/mass/nodules Lymph nodes: Cervical, supraclavicular, and axillary nodes normal. Resp: clear to auscultation bilaterally Back: symmetric, no curvature. ROM normal. No CVA tenderness. Cardio: regular rate and rhythm, S1,  S2 normal, no murmur, click, rub or gallop GI: soft, non-tender; bowel sounds normal; no masses,  no organomegaly Extremities: extremities normal, atraumatic, no cyanosis or edema Neurologic: Alert and oriented X 3, normal strength and tone. Normal symmetric reflexes. Normal coordination and gait  ECOG PERFORMANCE STATUS: 1 - Symptomatic but completely ambulatory  Blood pressure 125/73, pulse 83, temperature 97.6 F (36.4 C), temperature source Oral, resp. rate 18, height 5\' 8"  (1.727 m), weight 252 lb 9.6 oz (114.6 kg), SpO2 99 %.  LABORATORY DATA: Lab Results  Component Value Date   WBC 3.9 (L) 07/24/2018   HGB 8.9 (L) 07/24/2018   HCT 28.4 (L) 07/24/2018   MCV 95.3 07/24/2018   PLT 190 07/24/2018      Chemistry      Component Value Date/Time   NA 135 07/18/2018 0023   K 3.4 (L) 07/18/2018 0023   CL 99 07/18/2018 0023   CO2 26 07/18/2018 0015   BUN 29 (H) 07/18/2018 0023   CREATININE 1.60 (H) 07/18/2018 0023   CREATININE 1.43 (H) 07/10/2018 2423  Component Value Date/Time   CALCIUM 9.5 07/18/2018 0015   ALKPHOS 43 07/10/2018 0933   AST 13 (L) 07/10/2018 0933   ALT 10 07/10/2018 0933   BILITOT 0.5 07/10/2018 0933       RADIOGRAPHIC STUDIES: Dg Chest 2 View  Result Date: 07/18/2018 CLINICAL DATA:  Cough EXAM: CHEST - 2 VIEW COMPARISON:  CT 05/01/2018, radiograph 12/22/2017 FINDINGS: Left-sided central venous port tip over the SVC. Volume loss in the right thorax. Small bilateral pleural effusions. Increased right upper lobe opacity. Borderline cardiomegaly. No pneumothorax. Degenerative changes of the spine. IMPRESSION: 1. Development of small bilateral pleural effusions. 2. Volume loss in the right thorax with new tenting of the right diaphragm. Rounded opacity in the right upper lobe may reflect pneumonia or recurrent mass lesion. Contrast-enhanced CT is suggested for further evaluation. Electronically Signed   By: Donavan Foil M.D.   On: 07/18/2018 00:03   Ct  Chest W Contrast  Result Date: 07/18/2018 CLINICAL DATA:  Productive cough with hemoptysis. Treatment for lung cancer July, 2019 with radiation and chemotherapy. EXAM: CT CHEST WITH CONTRAST TECHNIQUE: Multidetector CT imaging of the chest was performed during intravenous contrast administration. CONTRAST:  3mL OMNIPAQUE IOHEXOL 300 MG/ML  SOLN COMPARISON:  07/01/2018 CT FINDINGS: Cardiovascular: Normal heart size with trace pericardial effusion. 4.4 cm ascending thoracic aortic aneurysm with atherosclerosis. No dissection. No large central pulmonary embolus. Patent great vessels with atherosclerosis. Scattered coronary arteriosclerosis along the LAD and left circumflex. Mediastinum/Nodes: No enlarged mediastinal, hilar, or axillary lymph nodes. Thyroid gland, trachea, and esophagus demonstrate no significant findings. Lungs/Pleura: Bilateral small to moderate pleural effusions. New masslike pulmonary consolidations with air bronchograms in the right upper lobe and superior segment of right lower lobe. Volume loss with mild tenting along the inferior pulmonary ligament on the right. Consolidation and atelectasis noted at the left lung base laterally. Upper Abdomen: No acute abnormality. Musculoskeletal: No chest wall abnormality. Accentuated thoracic curvature due to multilevel degenerative disc disease. Diffuse idiopathic skeletal hyperostosis with flowing anterior osteophytes are noted. IMPRESSION: 1. New masslike pulmonary consolidations with air bronchograms in the right upper lobe and superior segment of right lower lobe with volume loss and mild tenting along the inferior pulmonary ligament. Findings are in keeping with multifocal pneumonia. Followup PA and lateral chest X-ray is recommended in 3-4 weeks following trial of antibiotic therapy to ensure resolution and exclude recurrence of malignancy. 2. Small to moderate bilateral pleural effusions. 3. 4.4 cm ascending thoracic aortic aneurysm. Recommend  annual imaging followup by CTA or MRA. This recommendation follows 2010 ACCF/AHA/AATS/ACR/ASA/SCA/SCAI/SIR/STS/SVM Guidelines for the Diagnosis and Management of Patients with Thoracic Aortic Disease. 2010; 121: M094-B096. 4. Coronary arteriosclerosis. Aortic Atherosclerosis (ICD10-I70.0). Electronically Signed   By: Ashley Royalty M.D.   On: 07/18/2018 01:46    ASSESSMENT AND PLAN: This is a very pleasant 73 years old white male with a stage IIb/IIIa non-small cell lung cancer, squamous cell carcinoma diagnosed in July 2019. The patient is currently undergoing a course of concurrent chemoradiation with weekly carboplatin and paclitaxel status post 5 cycles.  He had partial response after the initial induction treatment. The patient was started on treatment with consolidation Imfinzi status post 4 cycles.  He tolerated the last cycle of his treatment well but he was recently admitted to [facility] with multifocal pneumonia and treated with IV antibiotics as well as outpatient arrangement with Ceftin and azithromycin completed yesterday.  The patient continues to have significant cough and hemoptysis with no improvement in his  condition. I recommended for him to delay his treatment with immunotherapy for now. For the persistent pneumonia, I will start the patient on Levaquin 500 mg p.o. daily for 7 days.  I will also refer the patient to pulmonary medicine for evaluation of his persistent hemoptysis.  I will also consider the patient for a short course of steroid with Medrol Dosepak. I will see him back for follow-up visit in 2 weeks for evaluation before the next cycle of his treatment. He was advised to call immediately if he has any concerning symptoms in the interval. The patient voices understanding of current disease status and treatment options and is in agreement with the current care plan. All questions were answered. The patient knows to call the clinic with any problems, questions or  concerns. We can certainly see the patient much sooner if necessary.  Disclaimer: This note was dictated with voice recognition software. Similar sounding words can inadvertently be transcribed and may not be corrected upon review.

## 2018-07-26 ENCOUNTER — Telehealth: Payer: Self-pay | Admitting: Internal Medicine

## 2018-07-26 NOTE — Telephone Encounter (Signed)
Per 1/13 los - referral sent through [facility]

## 2018-08-03 ENCOUNTER — Ambulatory Visit: Payer: Medicare Other | Admitting: Emergency Medicine

## 2018-08-03 ENCOUNTER — Encounter: Payer: Self-pay | Admitting: Emergency Medicine

## 2018-08-03 VITALS — BP 130/76 | HR 77 | Ht 69.0 in | Wt 251.0 lb

## 2018-08-03 DIAGNOSIS — J449 Chronic obstructive pulmonary disease, unspecified: Secondary | ICD-10-CM

## 2018-08-03 DIAGNOSIS — R042 Hemoptysis: Secondary | ICD-10-CM | POA: Diagnosis not present

## 2018-08-03 DIAGNOSIS — J690 Pneumonitis due to inhalation of food and vomit: Secondary | ICD-10-CM | POA: Diagnosis not present

## 2018-08-03 DIAGNOSIS — C3411 Malignant neoplasm of upper lobe, right bronchus or lung: Secondary | ICD-10-CM | POA: Diagnosis not present

## 2018-08-03 MED ORDER — ALBUTEROL SULFATE HFA 108 (90 BASE) MCG/ACT IN AERS: 2.0000 | INHALATION_SPRAY | RESPIRATORY_TRACT | 5 refills | Status: AC | PRN

## 2018-08-03 NOTE — Assessment & Plan Note (Signed)
Pulmonary function testing consistent with mixed obstruction and restriction.  He has never had dyspnea or overt symptoms of COPD but he certainly at risk.  I do not know that he needs to be on a schedule bronchodilator at this time but I think he should learn how to use a short acting bronchodilator to have available in the event that he has a sudden clinical change.  We will keep track of his respiratory status and consider either repeat PFT or empiric bronchodilator therapy going forward depending on how he is doing.

## 2018-08-03 NOTE — Patient Instructions (Signed)
I am glad to hear that you are coughing and bleeding have improved.  I suspect that these were due to pneumonia. I do not see any contraindication to restarting your immunotherapy with Dr. [name] as long as you continue to feel well. We will repeat your CT scan of the chest in mid February 2020 without contrast to look for resolution of pneumonia. If your CT scan still shows abnormalities or if you develop any recurrence of blood in your mucus then we will perform bronchoscopy. We will teach you how to use an albuterol inhaler.  You should keep this available to use 2 puffs if you need it for shortness of breath, chest tightness, wheezing. Depending on how your breathing does going forward we may revisit any potential benefits from being on an every day inhaler.  We will not start one right now. Follow with Dr Lamonte Sakai in February after the CT chest to review together.

## 2018-08-03 NOTE — Progress Notes (Signed)
Subjective:    Patient ID: Darrell [name], male    DOB: [birth year], 73 y.o.   MRN: [id number]  HPI 73 year old former smoker (70 pack years) with a history of diabetes, hypertension, TAA, chronic kidney disease.  He is followed by Dr. 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  He has been treated with concurrent chemoradiation, consolidation with Imfinzi.  He was admitted for HCAP early January, characterized by cough, dyspnea, hemoptysis.  CT chest 07/18/2018 reviewed by me, shows consolidative changes in the right upper lobe, superior segment of the right lower lobe with air bronchograms, small to moderate bilateral pleural effusions.  He saw Dr. [name] on 1/13 and continued to have some hemoptysis.  His immunotherapy was put on hold and he was treated with a course of levofloxacin, completed on 1/20.  His hemoptysis has stopped overt the last 3-4 days. He feels better overall. He denies breathing difficulty, wheeze.   PFT done on 02/13/2018 reviewed by me, shows mixed obstruction and restriction without a bronchodilator response, normal lung volumes and a decreased diffusion capacity that corrects to the normal range when adjusted for alveolar volume.  He is not currently on bronchodilator therapy.    Review of Systems  Constitutional: Negative for fever and unexpected weight change.  HENT: Negative for congestion, dental problem, ear pain, nosebleeds, postnasal drip, rhinorrhea, sinus pressure, sneezing, sore throat and trouble swallowing.   Eyes: Negative for redness and itching.  Respiratory: Negative for cough, chest tightness, shortness of breath and wheezing.   Cardiovascular: Negative for palpitations and leg swelling.  Gastrointestinal: Negative for nausea and vomiting.  Genitourinary: Negative for dysuria.  Musculoskeletal: Negative for joint swelling.  Skin: Negative for rash.  Neurological: Negative for headaches.    Hematological: Does not bruise/bleed easily.  Psychiatric/Behavioral: Negative for dysphoric mood. The patient is not nervous/anxious.     Past Medical History:  Diagnosis Date  . Aortic atherosclerosis ([place]) 07/18/2018  . Cancer ([place])   . Cellulitis of right lower extremity   . Chronic kidney disease   . Diabetes mellitus without complication ([place])   . Dyslipidemia   . Hypertension   . Thoracic aortic aneurysm without rupture ([place]) 07/18/2018     Family History  Problem Relation Age of Onset  . Breast cancer Mother   . CAD Father      Social History   Socioeconomic History  . Marital status: Married    Spouse name: Not on file  . Number of children: Not on file  . Years of education: Not on file  . Highest education level: Not on file  Occupational History  . Not on file  Social Needs  . Financial resource strain: Not on file  . Food insecurity:    Worry: Not on file    Inability: Not on file  . Transportation needs:    Medical: Not on file    Non-medical: Not on file  Tobacco Use  . Smoking status: Former Smoker    Packs/day: 2.00    Years: 35.00    Pack years: 70.00    Types: Cigarettes    Last attempt to quit: 07/12/1996    Years since quitting: 22.0  . Smokeless tobacco: Never Used  Substance and Sexual Activity  . Alcohol use: Yes    Comment: occasionally  . Drug use: Never  . Sexual activity: Not Currently  Lifestyle  . Physical activity:    Days per week: Not  on file    Minutes per session: Not on file  . Stress: Not on file  Relationships  . Social connections:    Talks on phone: Not on file    Gets together: Not on file    Attends religious service: Not on file    Active member of club or organization: Not on file    Attends meetings of clubs or organizations: Not on file    Relationship status: Not on file  . Intimate partner violence:    Fear of current or ex partner: Not on file    Emotionally abused: Not on file    Physically abused: Not  on file    Forced sexual activity: Not on file  Other Topics Concern  . Not on file  Social History Narrative   05-15-18 Unable to ask abuse questions wife with him today   Sales, manufacturer rep for music instruments [place] native Was in the [employer] in [place] Reactions  . Lisinopril Cough          Outpatient Medications Prior to Visit  Medication Sig Dispense Refill  . ASTAXANTHIN PO Take 1 capsule by mouth daily.     . carvedilol (COREG) 6.25 MG tablet Take 6.25 mg by mouth 2 (two) times daily with a meal.     . Cinnamon Bark POWD Take 1,000 mg by mouth 2 (two) times daily.     . cloNIDine (CATAPRES) 0.1 MG tablet Take 0.1 mg by mouth 2 (two) times daily.     Marland Kitchen escitalopram (LEXAPRO) 10 MG tablet Take 10 mg by mouth daily.     . fenofibrate 160 MG tablet Take 160 mg by mouth daily.     . Glucosamine Sulfate 1000 MG TABS Take 2,000 mg by mouth daily.     Marland Kitchen glucose blood (PRECISION QID TEST) test strip by Misc.(Non-Drug; Combo Route) route.    . hydrochlorothiazide (HYDRODIURIL) 25 MG tablet Take 25 mg by mouth daily.     . Insulin Detemir (LEVEMIR FLEXTOUCH) 100 UNIT/ML Pen Inject 10 Units into the skin daily.     . Insulin Pen Needle (FIFTY50 PEN NEEDLES) 31G X 8 MM MISC USE AS DIRECTED    . Lancets MISC by Misc.(Non-Drug; Combo Route) route.    . lidocaine-prilocaine (EMLA) cream Apply 1 application topically as needed. (Patient taking differently: Apply 1 application topically as needed (port). ) 30 g 0  . losartan (COZAAR) 50 MG tablet Take 50 mg by mouth daily.     . magnesium oxide (MAG-OX) 400 MG tablet Take 400 mg by mouth daily.    . metFORMIN (GLUCOPHAGE) 1000 MG tablet Take 1,000 mg by mouth 2 (two) times daily with a meal.     . Multiple Vitamin (MULTI-VITAMINS) TABS Take 1 tablet by mouth daily.     . Omega-3 1000 MG CAPS Take 1,200 mg by mouth 2 (two) times daily.     . pioglitazone (ACTOS) 45 MG tablet Take 45 mg by mouth daily.     . pravastatin  (PRAVACHOL) 40 MG tablet Take 40 mg by mouth daily.     . prochlorperazine (COMPAZINE) 10 MG tablet TAKE 1 TABLET BY MOUTH EVERY 6 HOURS AS NEEDED FOR NAUSEA OR VOMITING (Patient taking differently: Take 10 mg by mouth every 6 (six) hours as needed for nausea or vomiting. ) 385 tablet 0  . traMADol (ULTRAM) 50 MG tablet Take 1 tablet (50 mg total) by mouth every 6 (six) hours as  needed for severe pain. 20 tablet 0  . levofloxacin (LEVAQUIN) 500 MG tablet Take 1 tablet (500 mg total) by mouth daily. 7 tablet 0  . methylPREDNISolone (MEDROL DOSEPAK) 4 MG TBPK tablet Use as instructed. 21 tablet 0   Facility-Administered Medications Prior to Visit  Medication Dose Route Frequency Provider Last Rate Last Dose  . sodium chloride flush (NS) 0.9 % injection 10 mL  10 mL Intracatheter PRN Curt Bears, MD   10 mL at 06/28/18 1501        Objective:   Physical Exam Vitals:   08/03/18 0928  BP: 130/76  Pulse: 77  SpO2: 97%  Weight: 251 lb (113.9 kg)  Height: 5\' 9"  (1.753 m)   Gen: Pleasant, overwt man, in no distress,  normal affect  ENT: No lesions,  mouth clear,  oropharynx clear, no postnasal drip  Neck: No JVD, no stridor  Lungs: No use of accessory muscles, coarse on the right, no wheezing  Cardiovascular: RRR, heart sounds normal, no murmur or gallops, no peripheral edema  Musculoskeletal: No deformities, no cyanosis or clubbing  Neuro: alert, awake, non focal  Skin: Warm, no lesions or rash      Assessment & Plan:  Hemoptysis Based on the appearance on CT scan and the clinical history I suspect that this was due to healthcare associated pneumonia.  He has improved since he was treated with first IV antibiotics and then levofloxacin.  We need to repeat a CT scan of the chest after another month to see if there is been radiographical improvement in addition to clinical improvement.  If the CT scan remains abnormal or suspicious, or if he develops recurrent hemoptysis that I  think he needs bronchoscopy and airway inspection.  For now I am okay deferring the bronchoscopy to follow clinically and follow his CT.  Stage III squamous cell cancer Follow-up with Dr. [name].  I will review the case with him and in particular review the CT scan when it is available to ensure that we do not believe there is an indication for repeat bronchoscopy.  COPD (chronic obstructive pulmonary disease) ([place]) Pulmonary function testing consistent with mixed obstruction and restriction.  He has never had dyspnea or overt symptoms of COPD but he certainly at risk.  I do not know that he needs to be on a schedule bronchodilator at this time but I think he should learn how to use a short acting bronchodilator to have available in the event that he has a sudden clinical change.  We will keep track of his respiratory status and consider either repeat PFT or empiric bronchodilator therapy going forward depending on how he is doing.  Baltazar Apo, MD, PhD 08/03/2018, 1:24 PM [facility] [phone] or if no answer [phone]

## 2018-08-03 NOTE — Assessment & Plan Note (Signed)
Based on the appearance on CT scan and the clinical history I suspect that this was due to healthcare associated pneumonia.  He has improved since he was treated with first IV antibiotics and then levofloxacin.  We need to repeat a CT scan of the chest after another month to see if there is been radiographical improvement in addition to clinical improvement.  If the CT scan remains abnormal or suspicious, or if he develops recurrent hemoptysis that I think he needs bronchoscopy and airway inspection.  For now I am okay deferring the bronchoscopy to follow clinically and follow his CT.

## 2018-08-03 NOTE — Assessment & Plan Note (Signed)
Follow-up with Dr. [name].  I will review the case with him and in particular review the CT scan when it is available to ensure that we do not believe there is an indication for repeat bronchoscopy.

## 2018-08-07 ENCOUNTER — Inpatient Hospital Stay: Payer: Medicare Other

## 2018-08-07 ENCOUNTER — Encounter: Payer: Self-pay | Admitting: Adult Health

## 2018-08-07 ENCOUNTER — Inpatient Hospital Stay (HOSPITAL_BASED_OUTPATIENT_CLINIC_OR_DEPARTMENT_OTHER): Payer: Medicare Other | Admitting: Adult Health

## 2018-08-07 VITALS — BP 154/82 | HR 70 | Temp 97.6°F | Resp 18 | Ht 69.0 in | Wt 256.0 lb

## 2018-08-07 DIAGNOSIS — C3411 Malignant neoplasm of upper lobe, right bronchus or lung: Secondary | ICD-10-CM

## 2018-08-07 DIAGNOSIS — R5382 Chronic fatigue, unspecified: Secondary | ICD-10-CM

## 2018-08-07 DIAGNOSIS — J449 Chronic obstructive pulmonary disease, unspecified: Secondary | ICD-10-CM

## 2018-08-07 DIAGNOSIS — Z5112 Encounter for antineoplastic immunotherapy: Secondary | ICD-10-CM | POA: Diagnosis not present

## 2018-08-07 DIAGNOSIS — R05 Cough: Secondary | ICD-10-CM

## 2018-08-07 DIAGNOSIS — I700 Atherosclerosis of aorta: Secondary | ICD-10-CM

## 2018-08-07 DIAGNOSIS — I2510 Atherosclerotic heart disease of native coronary artery without angina pectoris: Secondary | ICD-10-CM

## 2018-08-07 DIAGNOSIS — J90 Pleural effusion, not elsewhere classified: Secondary | ICD-10-CM

## 2018-08-07 DIAGNOSIS — I712 Thoracic aortic aneurysm, without rupture: Secondary | ICD-10-CM

## 2018-08-07 DIAGNOSIS — Z794 Long term (current) use of insulin: Secondary | ICD-10-CM

## 2018-08-07 DIAGNOSIS — E785 Hyperlipidemia, unspecified: Secondary | ICD-10-CM

## 2018-08-07 DIAGNOSIS — I129 Hypertensive chronic kidney disease with stage 1 through stage 4 chronic kidney disease, or unspecified chronic kidney disease: Secondary | ICD-10-CM

## 2018-08-07 DIAGNOSIS — J181 Lobar pneumonia, unspecified organism: Secondary | ICD-10-CM

## 2018-08-07 DIAGNOSIS — R042 Hemoptysis: Secondary | ICD-10-CM

## 2018-08-07 DIAGNOSIS — Z95828 Presence of other vascular implants and grafts: Secondary | ICD-10-CM

## 2018-08-07 DIAGNOSIS — N183 Chronic kidney disease, stage 3 (moderate): Secondary | ICD-10-CM

## 2018-08-07 DIAGNOSIS — Z923 Personal history of irradiation: Secondary | ICD-10-CM

## 2018-08-07 DIAGNOSIS — Z9221 Personal history of antineoplastic chemotherapy: Secondary | ICD-10-CM

## 2018-08-07 DIAGNOSIS — Z79899 Other long term (current) drug therapy: Secondary | ICD-10-CM

## 2018-08-07 LAB — CMP (CANCER CENTER ONLY)
ALT: 8 U/L (ref 0–44)
AST: 14 U/L — ABNORMAL LOW (ref 15–41)
Albumin: 3.4 g/dL — ABNORMAL LOW (ref 3.5–5.0)
Alkaline Phosphatase: 32 U/L — ABNORMAL LOW (ref 38–126)
Anion gap: 8 (ref 5–15)
BUN: 38 mg/dL — ABNORMAL HIGH (ref 8–23)
CO2: 25 mmol/L (ref 22–32)
Calcium: 9 mg/dL (ref 8.9–10.3)
Chloride: 105 mmol/L (ref 98–111)
Creatinine: 1.57 mg/dL — ABNORMAL HIGH (ref 0.61–1.24)
GFR, Est AFR Am: 51 mL/min — ABNORMAL LOW (ref >60)
GFR, Estimated: 44 mL/min — ABNORMAL LOW (ref >60)
Glucose, Bld: 105 mg/dL — ABNORMAL HIGH (ref 70–99)
Potassium: 3.8 mmol/L (ref 3.5–5.1)
Sodium: 138 mmol/L (ref 135–145)
Total Bilirubin: 0.4 mg/dL (ref 0.3–1.2)
Total Protein: 6.6 g/dL (ref 6.5–8.1)

## 2018-08-07 LAB — CBC WITH DIFFERENTIAL (CANCER CENTER ONLY)
Abs Immature Granulocytes: 0.01 10*3/uL (ref 0.00–0.07)
Basophils Absolute: 0 10*3/uL (ref 0.0–0.1)
Basophils Relative: 1 %
Eosinophils Absolute: 0.2 10*3/uL (ref 0.0–0.5)
Eosinophils Relative: 9 %
HCT: 28.3 % — ABNORMAL LOW (ref 39.0–52.0)
Hemoglobin: 9.2 g/dL — ABNORMAL LOW (ref 13.0–17.0)
Immature Granulocytes: 0 %
Lymphocytes Relative: 15 %
Lymphs Abs: 0.4 10*3/uL — ABNORMAL LOW (ref 0.7–4.0)
MCH: 30.3 pg (ref 26.0–34.0)
MCHC: 32.5 g/dL (ref 30.0–36.0)
MCV: 93.1 fL (ref 80.0–100.0)
Monocytes Absolute: 0.3 10*3/uL (ref 0.1–1.0)
Monocytes Relative: 10 %
Neutro Abs: 1.9 10*3/uL (ref 1.7–7.7)
Neutrophils Relative %: 65 %
Platelet Count: 160 10*3/uL (ref 150–400)
RBC: 3.04 MIL/uL — ABNORMAL LOW (ref 4.22–5.81)
RDW: 16 % — ABNORMAL HIGH (ref 11.5–15.5)
WBC Count: 2.8 10*3/uL — ABNORMAL LOW (ref 4.0–10.5)
nRBC: 0 % (ref 0.0–0.2)

## 2018-08-07 LAB — TSH: TSH: 2.725 u[IU]/mL (ref 0.320–4.118)

## 2018-08-07 MED ORDER — SODIUM CHLORIDE 0.9% FLUSH
10.0000 mL | INTRAVENOUS | Status: DC | PRN
  Administered 2018-08-07: 10 mL
  Filled 2018-08-07: qty 10

## 2018-08-07 MED ORDER — SODIUM CHLORIDE 0.9 % IV SOLN
Freq: Once | INTRAVENOUS | Status: AC
  Administered 2018-08-07: 09:00:00 via INTRAVENOUS
  Filled 2018-08-07: qty 250

## 2018-08-07 MED ORDER — SODIUM CHLORIDE 0.9 % IV SOLN
10.0000 mg/kg | Freq: Once | INTRAVENOUS | Status: AC
  Administered 2018-08-07: 1120 mg via INTRAVENOUS
  Filled 2018-08-07: qty 20

## 2018-08-07 MED ORDER — HEPARIN SOD (PORK) LOCK FLUSH 100 UNIT/ML IV SOLN
500.0000 [IU] | Freq: Once | INTRAVENOUS | Status: AC | PRN
  Administered 2018-08-07: 500 [IU]
  Filled 2018-08-07: qty 5

## 2018-08-07 NOTE — Progress Notes (Signed)
Per Wilber Bihari, NP it is ok to treat today with creatinine of 1.57

## 2018-08-07 NOTE — Patient Instructions (Signed)
[facility] Discharge Instructions for Patients Receiving Chemotherapy  Today you received the following chemotherapy agents: Imfinzi.  To help prevent nausea and vomiting after your treatment, we encourage you to take your nausea medication as directed.   If you develop nausea and vomiting that is not controlled by your nausea medication, call the clinic.   BELOW ARE SYMPTOMS THAT SHOULD BE REPORTED IMMEDIATELY:  *FEVER GREATER THAN 100.5 F  *CHILLS WITH OR WITHOUT FEVER  NAUSEA AND VOMITING THAT IS NOT CONTROLLED WITH YOUR NAUSEA MEDICATION  *UNUSUAL SHORTNESS OF BREATH  *UNUSUAL BRUISING OR BLEEDING  TENDERNESS IN MOUTH AND THROAT WITH OR WITHOUT PRESENCE OF ULCERS  *URINARY PROBLEMS  *BOWEL PROBLEMS  UNUSUAL RASH Items with * indicate a potential emergency and should be followed up as soon as possible.  Feel free to call the clinic should you have any questions or concerns. The clinic phone number is [phone].  Please show the CHEMO ALERT CARD at check-in to the Emergency Department and triage nurse.   

## 2018-08-07 NOTE — Assessment & Plan Note (Signed)
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    1. Lung cancer: recovering from pneumonia, reviewed with Dr. [name] his clinical improvement since his last visit, Darrell [name] will proceed with treatment today with Imfinzi.    2. Pneumonia: He has completed all antibiotic and steroid therapy as prescribed.  He has undergone evaluation by Dr. Lamonte Sakai in pulmonology.  He will undergo repeat CT chest on 08/28/2018.  His hemoptysis has resolved.    Darrell [name] will return in 2 weeks for labs, f/u with Dr. [name], and his next Imfinzi treatment.

## 2018-08-07 NOTE — Progress Notes (Signed)
[facility] Follow up:    Darrell Coma., MD [place] [address]   DIAGNOSIS: Stage III squamous cell carcinoma of the right lung  SUMMARY OF ONCOLOGIC HISTORY: Oncology History   Patient presented with hemoptysis to ED while working in [facility].  Work up showed right lung mass.      Stage III squamous cell cancer   01/31/2018 Imaging    CXR/CT Chest/Bronchoscopy in [facility]    01/31/2018 Pathology Results    NSCLC Squamous cell     02/07/2018 Initial Diagnosis    Stage III squamous cell carcinoma of right lung ([place])    02/16/2018 Imaging    PET IMPRESSION: 1. There is intense FDG uptake associated with the right hilar lung mass which obstructs the right upper lobe bronchus. This results in mild postobstructive pneumonitis in the right upper lobe. 2. Small right paratracheal lymph node demonstrates mild FDG uptake with an SUV max of 3.48, equivocal for metastatic disease. No evidence for distant metastatic disease.    02/27/2018 - 04/16/2018 Chemotherapy    The patient had palonosetron (ALOXI) injection 0.25 mg, 0.25 mg, Intravenous,  Once, 6 of 6 cycles Administration: 0.25 mg (02/27/2018), 0.25 mg (03/27/2018), 0.25 mg (03/06/2018), 0.25 mg (03/14/2018), 0.25 mg (03/20/2018) CARBOplatin (PARAPLATIN) 180 mg in sodium chloride 0.9 % 250 mL chemo infusion, 180 mg (100 % of original dose 182 mg), Intravenous,  Once, 6 of 6 cycles Dose modification: 182 mg (original dose 182 mg, Cycle 1), 210 mg (original dose 182 mg, Cycle 6, Reason: Change in SCr/CrCl) Administration: 180 mg (02/27/2018), 180 mg (03/27/2018), 180 mg (03/06/2018), 180 mg (03/14/2018), 180 mg (03/20/2018) PACLitaxel (TAXOL) 108 mg in sodium chloride 0.9 % 250 mL chemo infusion (</= 80mg /m2), 45 mg/m2 = 108 mg, Intravenous,  Once, 6 of 6 cycles Administration: 108 mg (02/27/2018), 108 mg (03/27/2018), 108 mg (03/06/2018), 108 mg (03/14/2018), 108 mg (03/20/2018)  for chemotherapy treatment.     05/17/2018 -  Chemotherapy    The patient had durvalumab (IMFINZI) 1,120 mg in sodium chloride 0.9 % 100 mL chemo infusion, 10 mg/kg = 1,120 mg, Intravenous,  Once, 5 of 13 cycles Administration: 1,120 mg (05/17/2018), 1,120 mg (05/31/2018), 1,120 mg (06/16/2018), 1,120 mg (07/10/2018)  for chemotherapy treatment.      CURRENT THERAPY: Imfinzi every 2 weeks  INTERVAL HISTORY: Darrell [name] 73 y.o. male returns for evaluation prior to receiving his consolidation immunotherpay with Imfinzi.  He was treated for pneumonia earlier this month, 07/18/2018, with CT chest showing multifocal pneumonia.  He has repeat CT chest scheduled on 08/28/2018.  He was treated with IV antibiotics and then oral antibiotics with levaquin x 7 days and a steroid taper.  He has completed his antibiotic and steroid therapy and took all medication as prescribed.  He was seen by Dr. Lamonte Sakai on 1/23 due to his hemoptysis.    Darrell [name] is feeling much improved.  His symptoms when he was admitted for pneumonia were hemoptysis.  He had been having this for about a week prior to his admission, and noted it every time he coughed, which was frequently throughout the day.  He didn't have any fevers or shortness of breath.  He felt fatigued and ran down.    He is feeling much better today.  He is not having any further hemoptysis, and his fatigue is improved.  His activity level is getting back to baseline.  He was at the grocery store for 2 hours shopping over  the weekend.  His wife Darrell [name] notes an improvement in his activity level.     Patient Active Problem List   Diagnosis Date Noted  . COPD (chronic obstructive pulmonary disease) ([place]) 08/03/2018  . Hemoptysis 07/18/2018  . CKD (chronic kidney disease) stage 3, GFR 30-59 ml/min (HCC) 07/18/2018  . Lobar pneumonia ([place]) 07/18/2018  . Thoracic aortic aneurysm without rupture ([place]) 07/18/2018  . Aortic atherosclerosis ([place]) 07/18/2018  . Encounter for antineoplastic immunotherapy  05/17/2018  . Port-A-Cath in place 03/27/2018  . Encounter for antineoplastic chemotherapy 02/16/2018  . 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 Goals of care, counseling/discussion 02/07/2018  . ANGIOKERATOMA, BLEEDING 10/06/2006  . DM2 (diabetes mellitus, type 2) ([place]) 10/06/2006  . HYPERLIPIDEMIA 10/06/2006  . HYPERTENSION, BENIGN 10/06/2006  . LATERAL MENISCUS TEAR, RIGHT 10/06/2006  . CHOLECYSTECTOMY, HX OF 10/06/2006    is allergic to lisinopril.  MEDICAL HISTORY: Past Medical History:  Diagnosis Date  . Aortic atherosclerosis ([place]) 07/18/2018  . Cancer ([place])   . Cellulitis of right lower extremity   . Chronic kidney disease   . Diabetes mellitus without complication ([place])   . Dyslipidemia   . Hypertension   . Thoracic aortic aneurysm without rupture ([place]) 07/18/2018    SURGICAL HISTORY: Past Surgical History:  Procedure Laterality Date  . PORTACATH PLACEMENT Left 02/21/2018   Procedure: INSERTION PORT-A-CATH;  Surgeon: Grace Isaac, MD;  Location: [facility] OR;  Service: Thoracic;  Laterality: Left;    SOCIAL HISTORY: Social History   Socioeconomic History  . Marital status: Married    Spouse name: Not on file  . Number of children: Not on file  . Years of education: Not on file  . Highest education level: Not on file  Occupational History  . Not on file  Social Needs  . Financial resource strain: Not on file  . Food insecurity:    Worry: Not on file    Inability: Not on file  . Transportation needs:    Medical: Not on file    Non-medical: Not on file  Tobacco Use  . Smoking status: Former Smoker    Packs/day: 2.00    Years: 35.00    Pack years: 70.00    Types: Cigarettes    Last attempt to quit: 07/12/1996    Years since quitting: 22.0  . Smokeless tobacco: Never Used  Substance and Sexual Activity  . Alcohol use: Yes    Comment: occasionally  . Drug use: Never  . Sexual activity: Not Currently  Lifestyle  . Physical activity:    Days per  week: Not on file    Minutes per session: Not on file  . Stress: Not on file  Relationships  . Social connections:    Talks on phone: Not on file    Gets together: Not on file    Attends religious service: Not on file    Active member of club or organization: Not on file    Attends meetings of clubs or organizations: Not on file    Relationship status: Not on file  . Intimate partner violence:    Fear of current or ex partner: Not on file    Emotionally abused: Not on file    Physically abused: Not on file    Forced sexual activity: Not on file  Other Topics Concern  . Not on file  Social History Narrative   05-15-18 Unable to ask abuse questions wife with him today    FAMILY HISTORY: Family History  Problem Relation Age of Onset  . Breast cancer Mother   . CAD Father     Review of Systems  Constitutional: Negative for appetite change, chills, fatigue, fever and unexpected weight change.  HENT:   Negative for hearing loss, lump/mass, mouth sores, sore throat and trouble swallowing.   Eyes: Negative for eye problems and icterus.  Respiratory: Positive for cough (occasional dry). Negative for chest tightness and shortness of breath.   Cardiovascular: Negative for chest pain, leg swelling and palpitations.  Gastrointestinal: Negative for abdominal distention, abdominal pain, constipation, diarrhea, nausea and vomiting.  Endocrine: Negative for hot flashes.  Genitourinary: Negative for difficulty urinating.   Musculoskeletal: Negative for arthralgias.  Skin: Negative for itching and rash.  Neurological: Negative for dizziness, extremity weakness, headaches and numbness.  Hematological: Negative for adenopathy. Does not bruise/bleed easily.  Psychiatric/Behavioral: Negative for depression. The patient is not nervous/anxious.       PHYSICAL EXAMINATION  ECOG PERFORMANCE STATUS: 1 - Symptomatic but completely ambulatory  Vitals:   08/07/18 0853  BP: (!) 154/82  Pulse: 70   Resp: 18  Temp: 97.6 F (36.4 C)  SpO2: 98%    Physical Exam Constitutional:      General: He is not in acute distress.    Appearance: Normal appearance. He is not toxic-appearing.  HENT:     Head: Normocephalic and atraumatic.     Mouth/Throat:     Mouth: Mucous membranes are moist.     Pharynx: Oropharynx is clear. No oropharyngeal exudate.  Eyes:     General: No scleral icterus.    Pupils: Pupils are equal, round, and reactive to light.  Neck:     Musculoskeletal: Neck supple.  Cardiovascular:     Rate and Rhythm: Normal rate and regular rhythm.     Pulses: Normal pulses.     Heart sounds: Normal heart sounds.  Pulmonary:     Effort: Pulmonary effort is normal. No respiratory distress.     Comments: Very slight crackle in right lower lung, slightly coarse when auscultated on right anteriorly Abdominal:     General: Abdomen is flat. Bowel sounds are normal. There is no distension.     Palpations: Abdomen is soft.     Tenderness: There is no abdominal tenderness.  Musculoskeletal:        General: No swelling.  Lymphadenopathy:     Cervical: No cervical adenopathy.  Skin:    General: Skin is warm and dry.     Capillary Refill: Capillary refill takes less than 2 seconds.     Findings: No rash.  Neurological:     General: No focal deficit present.     Mental Status: He is alert.  Psychiatric:        Mood and Affect: Mood normal.        Behavior: Behavior normal.     LABORATORY DATA:  CBC    Component Value Date/Time   WBC 2.8 (L) 08/07/2018 0757   WBC 3.6 (L) 07/19/2018 1026   RBC 3.04 (L) 08/07/2018 0757   HGB 9.2 (L) 08/07/2018 0757   HCT 28.3 (L) 08/07/2018 0757   PLT 160 08/07/2018 0757   MCV 93.1 08/07/2018 0757   MCH 30.3 08/07/2018 0757   MCHC 32.5 08/07/2018 0757   RDW 16.0 (H) 08/07/2018 0757   LYMPHSABS 0.4 (L) 08/07/2018 0757   MONOABS 0.3 08/07/2018 0757   EOSABS 0.2 08/07/2018 0757   BASOSABS 0.0 08/07/2018 0757    CMP     Component  Value Date/Time   NA 138 08/07/2018 0757   K 3.8 08/07/2018 0757   CL 105 08/07/2018 0757   CO2 25 08/07/2018 0757   GLUCOSE 105 (H) 08/07/2018 0757   GLUCOSE 102 (H) 06/06/2006 0946   BUN 38 (H) 08/07/2018 0757   CREATININE 1.57 (H) 08/07/2018 0757   CALCIUM 9.0 08/07/2018 0757   PROT 6.6 08/07/2018 0757   ALBUMIN 3.4 (L) 08/07/2018 0757   AST 14 (L) 08/07/2018 0757   ALT 8 08/07/2018 0757   ALKPHOS 32 (L) 08/07/2018 0757   BILITOT 0.4 08/07/2018 0757   GFRNONAA 44 (L) 08/07/2018 0757   GFRAA 51 (L) 08/07/2018 0757    ASSESSMENT and PLAN:   Stage III squamous cell cancer Darrell [name] is a pleasant 73 year old man with 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    1. Lung cancer: recovering from pneumonia, reviewed with Dr. [name] his clinical improvement since his last visit, Valerio will proceed with treatment today with Imfinzi.    2. Pneumonia: He has completed all antibiotic and steroid therapy as prescribed.  He has undergone evaluation by Dr. Lamonte Sakai in pulmonology.  He will undergo repeat CT chest on 08/28/2018.  His hemoptysis has resolved.    Darrell [name] will return in 2 weeks for labs, f/u with Dr. [name], and his next Imfinzi treatment.    All questions were answered. The patient knows to call the clinic with any problems, questions or concerns. We can certainly see the patient much sooner if necessary.  A total of (30) minutes of face-to-face time was spent with this patient with greater than 50% of that time in counseling and care-coordination.  This note was electronically signed. Scot Dock, NP 08/07/2018

## 2018-08-08 ENCOUNTER — Telehealth: Payer: Self-pay | Admitting: Adult Health

## 2018-08-08 NOTE — Telephone Encounter (Signed)
No los °

## 2018-08-21 ENCOUNTER — Telehealth: Payer: Self-pay

## 2018-08-21 ENCOUNTER — Inpatient Hospital Stay (HOSPITAL_BASED_OUTPATIENT_CLINIC_OR_DEPARTMENT_OTHER): Payer: Medicare Other | Admitting: Internal Medicine

## 2018-08-21 ENCOUNTER — Inpatient Hospital Stay: Payer: Medicare Other | Attending: Internal Medicine

## 2018-08-21 ENCOUNTER — Encounter: Payer: Self-pay | Admitting: Internal Medicine

## 2018-08-21 ENCOUNTER — Inpatient Hospital Stay: Payer: Medicare Other

## 2018-08-21 ENCOUNTER — Other Ambulatory Visit: Payer: Self-pay

## 2018-08-21 VITALS — BP 132/79 | HR 80 | Temp 97.5°F | Resp 18 | Ht 69.0 in | Wt 258.4 lb

## 2018-08-21 DIAGNOSIS — Z923 Personal history of irradiation: Secondary | ICD-10-CM | POA: Diagnosis not present

## 2018-08-21 DIAGNOSIS — C3411 Malignant neoplasm of upper lobe, right bronchus or lung: Secondary | ICD-10-CM

## 2018-08-21 DIAGNOSIS — N189 Chronic kidney disease, unspecified: Secondary | ICD-10-CM | POA: Insufficient documentation

## 2018-08-21 DIAGNOSIS — Z5112 Encounter for antineoplastic immunotherapy: Secondary | ICD-10-CM | POA: Diagnosis not present

## 2018-08-21 DIAGNOSIS — I129 Hypertensive chronic kidney disease with stage 1 through stage 4 chronic kidney disease, or unspecified chronic kidney disease: Secondary | ICD-10-CM | POA: Insufficient documentation

## 2018-08-21 DIAGNOSIS — I712 Thoracic aortic aneurysm, without rupture: Secondary | ICD-10-CM

## 2018-08-21 DIAGNOSIS — J90 Pleural effusion, not elsewhere classified: Secondary | ICD-10-CM | POA: Insufficient documentation

## 2018-08-21 DIAGNOSIS — Z794 Long term (current) use of insulin: Secondary | ICD-10-CM | POA: Insufficient documentation

## 2018-08-21 DIAGNOSIS — I10 Essential (primary) hypertension: Secondary | ICD-10-CM

## 2018-08-21 DIAGNOSIS — I700 Atherosclerosis of aorta: Secondary | ICD-10-CM | POA: Insufficient documentation

## 2018-08-21 DIAGNOSIS — Z9221 Personal history of antineoplastic chemotherapy: Secondary | ICD-10-CM | POA: Diagnosis not present

## 2018-08-21 DIAGNOSIS — Z79899 Other long term (current) drug therapy: Secondary | ICD-10-CM | POA: Insufficient documentation

## 2018-08-21 DIAGNOSIS — E785 Hyperlipidemia, unspecified: Secondary | ICD-10-CM | POA: Diagnosis not present

## 2018-08-21 LAB — CMP (CANCER CENTER ONLY)
ALT: 7 U/L (ref 0–44)
AST: 14 U/L — ABNORMAL LOW (ref 15–41)
Albumin: 3.7 g/dL (ref 3.5–5.0)
Alkaline Phosphatase: 37 U/L — ABNORMAL LOW (ref 38–126)
Anion gap: 10 (ref 5–15)
BUN: 35 mg/dL — ABNORMAL HIGH (ref 8–23)
CO2: 26 mmol/L (ref 22–32)
Calcium: 9.5 mg/dL (ref 8.9–10.3)
Chloride: 105 mmol/L (ref 98–111)
Creatinine: 1.61 mg/dL — ABNORMAL HIGH (ref 0.61–1.24)
GFR, Est AFR Am: 49 mL/min — ABNORMAL LOW (ref >60)
GFR, Estimated: 43 mL/min — ABNORMAL LOW (ref >60)
Glucose, Bld: 106 mg/dL — ABNORMAL HIGH (ref 70–99)
Potassium: 3.9 mmol/L (ref 3.5–5.1)
Sodium: 141 mmol/L (ref 135–145)
Total Bilirubin: 0.7 mg/dL (ref 0.3–1.2)
Total Protein: 7 g/dL (ref 6.5–8.1)

## 2018-08-21 LAB — CBC WITH DIFFERENTIAL (CANCER CENTER ONLY)
Abs Immature Granulocytes: 0.01 10*3/uL (ref 0.00–0.07)
Basophils Absolute: 0 10*3/uL (ref 0.0–0.1)
Basophils Relative: 1 %
Eosinophils Absolute: 0.2 10*3/uL (ref 0.0–0.5)
Eosinophils Relative: 10 %
HCT: 30.9 % — ABNORMAL LOW (ref 39.0–52.0)
Hemoglobin: 9.6 g/dL — ABNORMAL LOW (ref 13.0–17.0)
Immature Granulocytes: 0 %
Lymphocytes Relative: 17 %
Lymphs Abs: 0.4 10*3/uL — ABNORMAL LOW (ref 0.7–4.0)
MCH: 29.3 pg (ref 26.0–34.0)
MCHC: 31.1 g/dL (ref 30.0–36.0)
MCV: 94.2 fL (ref 80.0–100.0)
Monocytes Absolute: 0.3 10*3/uL (ref 0.1–1.0)
Monocytes Relative: 12 %
Neutro Abs: 1.3 10*3/uL — ABNORMAL LOW (ref 1.7–7.7)
Neutrophils Relative %: 60 %
Platelet Count: 164 10*3/uL (ref 150–400)
RBC: 3.28 MIL/uL — ABNORMAL LOW (ref 4.22–5.81)
RDW: 17 % — ABNORMAL HIGH (ref 11.5–15.5)
WBC Count: 2.2 10*3/uL — ABNORMAL LOW (ref 4.0–10.5)
nRBC: 0 % (ref 0.0–0.2)

## 2018-08-21 MED ORDER — SODIUM CHLORIDE 0.9% FLUSH
10.0000 mL | INTRAVENOUS | Status: DC | PRN
  Administered 2018-08-21: 10 mL
  Filled 2018-08-21: qty 10

## 2018-08-21 MED ORDER — HEPARIN SOD (PORK) LOCK FLUSH 100 UNIT/ML IV SOLN
500.0000 [IU] | Freq: Once | INTRAVENOUS | Status: AC | PRN
  Administered 2018-08-21: 500 [IU]
  Filled 2018-08-21: qty 5

## 2018-08-21 MED ORDER — SODIUM CHLORIDE 0.9 % IV SOLN
10.0000 mg/kg | Freq: Once | INTRAVENOUS | Status: AC
  Administered 2018-08-21: 1120 mg via INTRAVENOUS
  Filled 2018-08-21: qty 2.4

## 2018-08-21 MED ORDER — SODIUM CHLORIDE 0.9 % IV SOLN
Freq: Once | INTRAVENOUS | Status: AC
  Administered 2018-08-21: 09:00:00 via INTRAVENOUS
  Filled 2018-08-21: qty 250

## 2018-08-21 NOTE — Patient Instructions (Signed)
[facility] Discharge Instructions for Patients Receiving Chemotherapy  Today you received the following chemotherapy agents: Imfinzi.  To help prevent nausea and vomiting after your treatment, we encourage you to take your nausea medication as directed.   If you develop nausea and vomiting that is not controlled by your nausea medication, call the clinic.   BELOW ARE SYMPTOMS THAT SHOULD BE REPORTED IMMEDIATELY:  *FEVER GREATER THAN 100.5 F  *CHILLS WITH OR WITHOUT FEVER  NAUSEA AND VOMITING THAT IS NOT CONTROLLED WITH YOUR NAUSEA MEDICATION  *UNUSUAL SHORTNESS OF BREATH  *UNUSUAL BRUISING OR BLEEDING  TENDERNESS IN MOUTH AND THROAT WITH OR WITHOUT PRESENCE OF ULCERS  *URINARY PROBLEMS  *BOWEL PROBLEMS  UNUSUAL RASH Items with * indicate a potential emergency and should be followed up as soon as possible.  Feel free to call the clinic should you have any questions or concerns. The clinic phone number is [phone].  Please show the CHEMO ALERT CARD at check-in to the Emergency Department and triage nurse.   

## 2018-08-21 NOTE — Patient Instructions (Signed)

## 2018-08-21 NOTE — Progress Notes (Signed)
Per Dr. [name], ok to treat with Scr 1.61 and ANC 1.3.

## 2018-08-21 NOTE — Progress Notes (Signed)
[facility] [phone]   [phone]  OFFICE PROGRESS NOTE  [name], MD [place] [address]  DIAGNOSIS: Stage IIb/IIIa (T2b, N0/N2, M0), non-small cell lung cancer, squamous cell carcinoma diagnosed in July 2019 and presented with large right hilar mass with questionable mediastinal invasion  PRIOR THERAPY: Concurrent chemoradiation with weekly carboplatin for AUC of 2 and paclitaxel 45 mg/M2. First dose 02/27/2018.Status post 5 cycles.  CURRENT THERAPY:Consolidationimmunotherapy with Imfinzi 10 mg/kg every 2 weeks.First dose given on 05/17/2018.Status post 5 cycles.  INTERVAL HISTORY: Darrell [name] 73 y.o. male returns to the clinic today for follow-up visit accompanied by his wife.  The patient is feeling fine today with no concerning complaints except for shortness of breath with exertion.  He also has mild cough.  He denied having any chest pain or hemoptysis.  He denied having any recent weight loss or night sweats.  He has no nausea, vomiting, diarrhea or constipation.  He denied having any headache or visual changes.  He was seen recently by Dr. Lamonte Sakai who recommended for him to stay on the same treatment.  He is scheduled to have repeat CT scan of the chest next week.  The patient is here today for evaluation before starting cycle #6.  MEDICAL HISTORY: Past Medical History:  Diagnosis Date  . Aortic atherosclerosis ([place]) 07/18/2018  . Cancer ([place])   . Cellulitis of right lower extremity   . Chronic kidney disease   . Diabetes mellitus without complication ([place])   . Dyslipidemia   . Hypertension   . Thoracic aortic aneurysm without rupture ([place]) 07/18/2018    ALLERGIES:  is allergic to lisinopril.  MEDICATIONS:  Current Outpatient Medications  Medication Sig Dispense Refill  . albuterol (PROVENTIL HFA;VENTOLIN HFA) 108 (90 Base) MCG/ACT inhaler Inhale 2 puffs into the lungs every 4 (four)  hours as needed for wheezing or shortness of breath. 1 Inhaler 5  . ASTAXANTHIN PO Take 1 capsule by mouth daily.     . carvedilol (COREG) 6.25 MG tablet Take 6.25 mg by mouth 2 (two) times daily with a meal.     . Cinnamon Bark POWD Take 1,000 mg by mouth 2 (two) times daily.     . cloNIDine (CATAPRES) 0.1 MG tablet Take 0.1 mg by mouth 2 (two) times daily.     Marland Kitchen escitalopram (LEXAPRO) 10 MG tablet Take 10 mg by mouth daily.     . fenofibrate 160 MG tablet Take 160 mg by mouth daily.     . Glucosamine Sulfate 1000 MG TABS Take 2,000 mg by mouth daily.     Marland Kitchen glucose blood (PRECISION QID TEST) test strip by Misc.(Non-Drug; Combo Route) route.    . hydrochlorothiazide (HYDRODIURIL) 25 MG tablet Take 25 mg by mouth daily.     . Insulin Detemir (LEVEMIR FLEXTOUCH) 100 UNIT/ML Pen Inject 10 Units into the skin daily.     . Insulin Pen Needle (FIFTY50 PEN NEEDLES) 31G X 8 MM MISC USE AS DIRECTED    . Lancets MISC by Misc.(Non-Drug; Combo Route) route.    . lidocaine-prilocaine (EMLA) cream Apply 1 application topically as needed. (Patient taking differently: Apply 1 application topically as needed (port). ) 30 g 0  . losartan (COZAAR) 50 MG tablet Take 50 mg by mouth daily.     . magnesium oxide (MAG-OX) 400 MG tablet Take 400 mg by mouth daily.    . metFORMIN (GLUCOPHAGE) 1000 MG tablet Take 1,000  mg by mouth 2 (two) times daily with a meal.     . Multiple Vitamin (MULTI-VITAMINS) TABS Take 1 tablet by mouth daily.     . Omega-3 1000 MG CAPS Take 1,200 mg by mouth 2 (two) times daily.     . pioglitazone (ACTOS) 45 MG tablet Take 45 mg by mouth daily.     . pravastatin (PRAVACHOL) 40 MG tablet Take 40 mg by mouth daily.     . prochlorperazine (COMPAZINE) 10 MG tablet TAKE 1 TABLET BY MOUTH EVERY 6 HOURS AS NEEDED FOR NAUSEA OR VOMITING (Patient taking differently: Take 10 mg by mouth every 6 (six) hours as needed for nausea or vomiting. ) 385 tablet 0  . traMADol (ULTRAM) 50 MG tablet Take 1 tablet  (50 mg total) by mouth every 6 (six) hours as needed for severe pain. 20 tablet 0   No current facility-administered medications for this visit.    Facility-Administered Medications Ordered in Other Visits  Medication Dose Route Frequency Provider Last Rate Last Dose  . sodium chloride flush (NS) 0.9 % injection 10 mL  10 mL Intracatheter PRN Curt Bears, MD   10 mL at 06/28/18 1501    SURGICAL HISTORY:  Past Surgical History:  Procedure Laterality Date  . PORTACATH PLACEMENT Left 02/21/2018   Procedure: INSERTION PORT-A-CATH;  Surgeon: Grace Isaac, MD;  Location: [facility] OR;  Service: Thoracic;  Laterality: Left;    REVIEW OF SYSTEMS:  A comprehensive review of systems was negative except for: Respiratory: positive for cough and dyspnea on exertion   PHYSICAL EXAMINATION: General appearance: alert, cooperative and no distress Head: Normocephalic, without obvious abnormality, atraumatic Neck: no adenopathy, no JVD, supple, symmetrical, trachea midline and thyroid not enlarged, symmetric, no tenderness/mass/nodules Lymph nodes: Cervical, supraclavicular, and axillary nodes normal. Resp: clear to auscultation bilaterally Back: symmetric, no curvature. ROM normal. No CVA tenderness. Cardio: regular rate and rhythm, S1, S2 normal, no murmur, click, rub or gallop GI: soft, non-tender; bowel sounds normal; no masses,  no organomegaly Extremities: extremities normal, atraumatic, no cyanosis or edema  ECOG PERFORMANCE STATUS: 1 - Symptomatic but completely ambulatory  Blood pressure 132/79, pulse 80, temperature (!) 97.5 F (36.4 C), temperature source Oral, resp. rate 18, height 5\' 9"  (1.753 m), weight 258 lb 6.4 oz (117.2 kg), SpO2 100 %.  LABORATORY DATA: Lab Results  Component Value Date   WBC 2.8 (L) 08/07/2018   HGB 9.2 (L) 08/07/2018   HCT 28.3 (L) 08/07/2018   MCV 93.1 08/07/2018   PLT 160 08/07/2018      Chemistry      Component Value Date/Time   NA 138 08/07/2018  0757   K 3.8 08/07/2018 0757   CL 105 08/07/2018 0757   CO2 25 08/07/2018 0757   BUN 38 (H) 08/07/2018 0757   CREATININE 1.57 (H) 08/07/2018 0757      Component Value Date/Time   CALCIUM 9.0 08/07/2018 0757   ALKPHOS 32 (L) 08/07/2018 0757   AST 14 (L) 08/07/2018 0757   ALT 8 08/07/2018 0757   BILITOT 0.4 08/07/2018 0757       RADIOGRAPHIC STUDIES: No results found.  ASSESSMENT AND PLAN: This is a very pleasant 73 years old white male with a stage IIb/IIIa non-small cell lung cancer, squamous cell carcinoma diagnosed in July 2019. The patient is currently undergoing a course of concurrent chemoradiation with weekly carboplatin and paclitaxel status post 5 cycles.  He had partial response after the initial induction treatment. The patient was  started on treatment with consolidation Imfinzi status post 5 cycles.  He continues to tolerate this treatment well with no concerning adverse effects. I recommended for him to proceed with cycle #6 today as scheduled. I will see him back for follow-up visit in 2 weeks for evaluation before starting cycle #7. The patient was advised to call immediately if he has any concerning symptoms in the interval. The patient voices understanding of current disease status and treatment options and is in agreement with the current care plan. All questions were answered. The patient knows to call the clinic with any problems, questions or concerns. We can certainly see the patient much sooner if necessary.  Disclaimer: This note was dictated with voice recognition software. Similar sounding words can inadvertently be transcribed and may not be corrected upon review.

## 2018-08-21 NOTE — Telephone Encounter (Signed)
Printed avs and calender of upcoming appointment. Per 2/10 los 

## 2018-08-22 ENCOUNTER — Telehealth: Payer: Self-pay | Admitting: Internal Medicine

## 2018-08-22 NOTE — Telephone Encounter (Signed)
Moved 2/24 appointments from [facility] to [facility]. Spoke with patient re new time.

## 2018-08-28 ENCOUNTER — Ambulatory Visit (HOSPITAL_COMMUNITY)
Admission: RE | Admit: 2018-08-28 | Discharge: 2018-08-28 | Disposition: A | Discharge status: Home / Self Care | Payer: Medicare Other | Source: Ambulatory Visit | Attending: Emergency Medicine | Admitting: Emergency Medicine

## 2018-08-28 DIAGNOSIS — J690 Pneumonitis due to inhalation of food and vomit: Secondary | ICD-10-CM | POA: Insufficient documentation

## 2018-08-29 ENCOUNTER — Ambulatory Visit: Payer: Medicare Other | Admitting: Emergency Medicine

## 2018-08-29 ENCOUNTER — Encounter: Payer: Self-pay | Admitting: Emergency Medicine

## 2018-08-29 VITALS — BP 132/62 | HR 62 | Ht 69.0 in | Wt 261.0 lb

## 2018-08-29 DIAGNOSIS — C3411 Malignant neoplasm of upper lobe, right bronchus or lung: Secondary | ICD-10-CM | POA: Diagnosis not present

## 2018-08-29 DIAGNOSIS — R0602 Shortness of breath: Secondary | ICD-10-CM

## 2018-08-29 DIAGNOSIS — R0600 Dyspnea, unspecified: Secondary | ICD-10-CM | POA: Insufficient documentation

## 2018-08-29 DIAGNOSIS — J90 Pleural effusion, not elsewhere classified: Secondary | ICD-10-CM

## 2018-08-29 NOTE — Progress Notes (Signed)
Subjective:    Patient ID: Darrell [name], male    DOB: [birth year], 73 y.o.   MRN: [id number]  HPI 73 year old former smoker (70 pack years) with a history of diabetes, hypertension, TAA, chronic kidney disease.  He is followed by Dr. 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  He has been treated with concurrent chemoradiation, consolidation with Imfinzi.  He was admitted for HCAP early January, characterized by cough, dyspnea, hemoptysis.  CT chest 07/18/2018 reviewed by me, shows consolidative changes in the right upper lobe, superior segment of the right lower lobe with air bronchograms, small to moderate bilateral pleural effusions.  He saw Dr. [name] on 1/13 and continued to have some hemoptysis.  His immunotherapy was put on hold and he was treated with a course of levofloxacin, completed on 1/20.  His hemoptysis has stopped overt the last 3-4 days. He feels better overall. He denies breathing difficulty, wheeze.   PFT done on 02/13/2018 reviewed by me, shows mixed obstruction and restriction without a bronchodilator response, normal lung volumes and a decreased diffusion capacity that corrects to the normal range when adjusted for alveolar volume.  He is not currently on bronchodilator therapy.   ROV 08/29/2018 --patient follows up today for his history of mild COPD, stage IIIa squamous cell lung cancer of the right hilum that was treated with concurrent chemoradiation, consolidation with Imfinzi.  He had a pneumonia in early January with some consolidative changes in the superior segment of his right lower lobe and in his right upper lobe, effusions.  He was clinically improving with antibiotics but would continue to have some hemoptysis.  For this reason I repeated his CT scan early to ensure clearance and to find the right hilum.  The CT was done on 2/17 and I reviewed.  There is continued architectural distortion in the upper right  perihilar region with some associated bronchiectatic change, narrowing of the right upper lobe bronchus.  This may be smaller, slightly improved.  He has a large right pleural effusion. No fevers, chills, no hemoptysis. No sputum. He is having increase SOB lately - he tells me that this often coincides with his Infinzi.    Review of Systems  Constitutional: Negative for fever and unexpected weight change.  HENT: Negative for congestion, dental problem, ear pain, nosebleeds, postnasal drip, rhinorrhea, sinus pressure, sneezing, sore throat and trouble swallowing.   Eyes: Negative for redness and itching.  Respiratory: Negative for cough, chest tightness, shortness of breath and wheezing.   Cardiovascular: Negative for palpitations and leg swelling.  Gastrointestinal: Negative for nausea and vomiting.  Genitourinary: Negative for dysuria.  Musculoskeletal: Negative for joint swelling.  Skin: Negative for rash.  Neurological: Negative for headaches.  Hematological: Does not bruise/bleed easily.  Psychiatric/Behavioral: Negative for dysphoric mood. The patient is not nervous/anxious.     Past Medical History:  Diagnosis Date  . Aortic atherosclerosis ([place]) 07/18/2018  . Cancer ([place])   . Cellulitis of right lower extremity   . Chronic kidney disease   . Diabetes mellitus without complication ([place])   . Dyslipidemia   . Hypertension   . Thoracic aortic aneurysm without rupture ([place]) 07/18/2018     Family History  Problem Relation Age of Onset  . Breast cancer Mother   . CAD Father      Social History   Socioeconomic History  . Marital status: Married    Spouse name: Not on file  . Number  of children: Not on file  . Years of education: Not on file  . Highest education level: Not on file  Occupational History  . Not on file  Social Needs  . Financial resource strain: Not on file  . Food insecurity:    Worry: Not on file    Inability: Not on file  . Transportation needs:     Medical: Not on file    Non-medical: Not on file  Tobacco Use  . Smoking status: Former Smoker    Packs/day: 2.00    Years: 35.00    Pack years: 70.00    Types: Cigarettes    Last attempt to quit: 07/12/1996    Years since quitting: 22.1  . Smokeless tobacco: Never Used  Substance and Sexual Activity  . Alcohol use: Yes    Comment: occasionally  . Drug use: Never  . Sexual activity: Not Currently  Lifestyle  . Physical activity:    Days per week: Not on file    Minutes per session: Not on file  . Stress: Not on file  Relationships  . Social connections:    Talks on phone: Not on file    Gets together: Not on file    Attends religious service: Not on file    Active member of club or organization: Not on file    Attends meetings of clubs or organizations: Not on file    Relationship status: Not on file  . Intimate partner violence:    Fear of current or ex partner: Not on file    Emotionally abused: Not on file    Physically abused: Not on file    Forced sexual activity: Not on file  Other Topics Concern  . Not on file  Social History Narrative   05-15-18 Unable to ask abuse questions wife with him today   Sales, manufacturer rep for music instruments [place] Was in the [employer] in [place] Reactions  . Lisinopril Cough          Outpatient Medications Prior to Visit  Medication Sig Dispense Refill  . albuterol (PROVENTIL HFA;VENTOLIN HFA) 108 (90 Base) MCG/ACT inhaler Inhale 2 puffs into the lungs every 4 (four) hours as needed for wheezing or shortness of breath. 1 Inhaler 5  . ASTAXANTHIN PO Take 1 capsule by mouth daily.     . carvedilol (COREG) 6.25 MG tablet Take 6.25 mg by mouth 2 (two) times daily with a meal.     . Cinnamon Bark POWD Take 1,000 mg by mouth 2 (two) times daily.     . cloNIDine (CATAPRES) 0.1 MG tablet Take 0.1 mg by mouth 2 (two) times daily.     Marland Kitchen escitalopram (LEXAPRO) 10 MG tablet Take 10 mg by mouth daily.     .  fenofibrate 160 MG tablet Take 160 mg by mouth daily.     . Glucosamine Sulfate 1000 MG TABS Take 2,000 mg by mouth daily.     Marland Kitchen glucose blood (PRECISION QID TEST) test strip by Misc.(Non-Drug; Combo Route) route.    . hydrochlorothiazide (HYDRODIURIL) 25 MG tablet Take 25 mg by mouth daily.     . Insulin Detemir (LEVEMIR FLEXTOUCH) 100 UNIT/ML Pen Inject 10 Units into the skin daily.     . Insulin Pen Needle (FIFTY50 PEN NEEDLES) 31G X 8 MM MISC USE AS DIRECTED    . Lancets MISC by Misc.(Non-Drug; Combo Route) route.    . lidocaine-prilocaine (EMLA) cream Apply 1 application topically  as needed. (Patient taking differently: Apply 1 application topically as needed (port). ) 30 g 0  . losartan (COZAAR) 50 MG tablet Take 50 mg by mouth daily.     . magnesium oxide (MAG-OX) 400 MG tablet Take 400 mg by mouth daily.    . metFORMIN (GLUCOPHAGE) 1000 MG tablet Take 1,000 mg by mouth 2 (two) times daily with a meal.     . Multiple Vitamin (MULTI-VITAMINS) TABS Take 1 tablet by mouth daily.     . Omega-3 1000 MG CAPS Take 1,200 mg by mouth 2 (two) times daily.     . pioglitazone (ACTOS) 45 MG tablet Take 45 mg by mouth daily.     . pravastatin (PRAVACHOL) 40 MG tablet Take 40 mg by mouth daily.     . prochlorperazine (COMPAZINE) 10 MG tablet TAKE 1 TABLET BY MOUTH EVERY 6 HOURS AS NEEDED FOR NAUSEA OR VOMITING (Patient taking differently: Take 10 mg by mouth every 6 (six) hours as needed for nausea or vomiting. ) 385 tablet 0  . traMADol (ULTRAM) 50 MG tablet Take 1 tablet (50 mg total) by mouth every 6 (six) hours as needed for severe pain. 20 tablet 0   Facility-Administered Medications Prior to Visit  Medication Dose Route Frequency Provider Last Rate Last Dose  . sodium chloride flush (NS) 0.9 % injection 10 mL  10 mL Intracatheter PRN Curt Bears, MD   10 mL at 06/28/18 1501        Objective:   Physical Exam Vitals:   08/29/18 0933  BP: 132/62  Pulse: 62  SpO2: 98%  Weight: 261 lb  (118.4 kg)  Height: 5\' 9"  (1.753 m)   Gen: Pleasant, overwt man, in no distress,  normal affect  ENT: No lesions,  mouth clear,  oropharynx clear, no postnasal drip  Neck: No JVD, no stridor  Lungs: No use of accessory muscles, decreased on the right, no wheezing  Cardiovascular: RRR, heart sounds normal, no murmur or gallops, no peripheral edema  Musculoskeletal: No deformities, no cyanosis or clubbing  Neuro: alert, awake, non focal  Skin: Warm, no lesions or rash      Assessment & Plan:  Dyspnea He had similar dyspnea when he was on Imfinzi and speculates that this is at least a partial cause.  He also has mild obstruction and restriction on PFT, might benefit from bronchodilators.  I asked him to try using his albuterol to see if he gets benefit.  If so then we may add a schedule bronchodilator.  Finally of course the pleural effusion could be a contributor to his dyspnea.  I think he needs thoracentesis and he may get therapeutic benefit.  Stage III squamous cell cancer Following with Dr. [name] and on Imfinzi consolidation therapy.  I suspect that his pleural effusion is related to his recent pneumonia, possibly to architectural distortion post treatment of his cancer.  All the same I think he needs a diagnostic thoracentesis to ensure that cytology is negative, no evidence for pleural metastasis.  I will arrange for this.  Baltazar Apo, MD, PhD 08/29/2018, 10:11 AM [facility] [phone] or if no answer [phone]

## 2018-08-29 NOTE — Assessment & Plan Note (Signed)
He had similar dyspnea when he was on Imfinzi and speculates that this is at least a partial cause.  He also has mild obstruction and restriction on PFT, might benefit from bronchodilators.  I asked him to try using his albuterol to see if he gets benefit.  If so then we may add a schedule bronchodilator.  Finally of course the pleural effusion could be a contributor to his dyspnea.  I think he needs thoracentesis and he may get therapeutic benefit.

## 2018-08-29 NOTE — Patient Instructions (Signed)
Your CT scan of the chest shows resolution of some of the patchy changes seen when you were admitted for pneumonia.  There is still some scarring distortion of the mid right lung present, probably scar.  We will follow this with serial CT scans as planned by Dr. [name] We will arrange for a right thoracentesis with the assistance of interventional radiology.  We will help you schedule this. Try using your albuterol 2 puffs if needed for shortness of breath.  You may want to pretreat exercise to see if you get benefit from this.  Depending on your response we may decide to start you on an every day inhaler at some point in the future. Follow with Dr Lamonte Sakai next available after your thoracentesis to assess your status and review the results.

## 2018-08-29 NOTE — Assessment & Plan Note (Signed)
Following with Dr. [name] and on [place] consolidation therapy.  I suspect that his pleural effusion is related to his recent pneumonia, possibly to architectural distortion post treatment of his cancer.  All the same I think he needs a diagnostic thoracentesis to ensure that cytology is negative, no evidence for pleural metastasis.  I will arrange for this.

## 2018-08-30 ENCOUNTER — Ambulatory Visit (HOSPITAL_COMMUNITY): Payer: Medicare Other

## 2018-09-04 ENCOUNTER — Inpatient Hospital Stay: Payer: Medicare Other

## 2018-09-04 ENCOUNTER — Other Ambulatory Visit: Payer: Medicare Other

## 2018-09-04 ENCOUNTER — Inpatient Hospital Stay: Payer: Medicare Other | Admitting: Physician Assistant

## 2018-09-04 ENCOUNTER — Ambulatory Visit: Payer: Medicare Other | Admitting: Oncology

## 2018-09-04 ENCOUNTER — Telehealth: Payer: Self-pay | Admitting: Internal Medicine

## 2018-09-04 ENCOUNTER — Encounter: Payer: Self-pay | Admitting: Physician Assistant

## 2018-09-04 VITALS — BP 136/72 | HR 69 | Temp 98.0°F | Resp 18 | Ht 69.0 in | Wt 260.4 lb

## 2018-09-04 DIAGNOSIS — I700 Atherosclerosis of aorta: Secondary | ICD-10-CM

## 2018-09-04 DIAGNOSIS — C3411 Malignant neoplasm of upper lobe, right bronchus or lung: Secondary | ICD-10-CM

## 2018-09-04 DIAGNOSIS — N189 Chronic kidney disease, unspecified: Secondary | ICD-10-CM

## 2018-09-04 DIAGNOSIS — Z9221 Personal history of antineoplastic chemotherapy: Secondary | ICD-10-CM

## 2018-09-04 DIAGNOSIS — Z5112 Encounter for antineoplastic immunotherapy: Secondary | ICD-10-CM

## 2018-09-04 DIAGNOSIS — R0602 Shortness of breath: Secondary | ICD-10-CM

## 2018-09-04 DIAGNOSIS — J90 Pleural effusion, not elsewhere classified: Secondary | ICD-10-CM | POA: Diagnosis not present

## 2018-09-04 DIAGNOSIS — Z95828 Presence of other vascular implants and grafts: Secondary | ICD-10-CM

## 2018-09-04 DIAGNOSIS — I712 Thoracic aortic aneurysm, without rupture: Secondary | ICD-10-CM

## 2018-09-04 DIAGNOSIS — Z794 Long term (current) use of insulin: Secondary | ICD-10-CM

## 2018-09-04 DIAGNOSIS — E785 Hyperlipidemia, unspecified: Secondary | ICD-10-CM

## 2018-09-04 DIAGNOSIS — I129 Hypertensive chronic kidney disease with stage 1 through stage 4 chronic kidney disease, or unspecified chronic kidney disease: Secondary | ICD-10-CM

## 2018-09-04 DIAGNOSIS — Z923 Personal history of irradiation: Secondary | ICD-10-CM

## 2018-09-04 DIAGNOSIS — R5382 Chronic fatigue, unspecified: Secondary | ICD-10-CM

## 2018-09-04 DIAGNOSIS — Z79899 Other long term (current) drug therapy: Secondary | ICD-10-CM

## 2018-09-04 LAB — CBC WITH DIFFERENTIAL (CANCER CENTER ONLY)
Abs Immature Granulocytes: 0 10*3/uL (ref 0.00–0.07)
Basophils Absolute: 0 10*3/uL (ref 0.0–0.1)
Basophils Relative: 2 %
Eosinophils Absolute: 0.2 10*3/uL (ref 0.0–0.5)
Eosinophils Relative: 6 %
HCT: 32.2 % — ABNORMAL LOW (ref 39.0–52.0)
Hemoglobin: 10.1 g/dL — ABNORMAL LOW (ref 13.0–17.0)
Immature Granulocytes: 0 %
Lymphocytes Relative: 17 %
Lymphs Abs: 0.5 10*3/uL — ABNORMAL LOW (ref 0.7–4.0)
MCH: 29.1 pg (ref 26.0–34.0)
MCHC: 31.4 g/dL (ref 30.0–36.0)
MCV: 92.8 fL (ref 80.0–100.0)
Monocytes Absolute: 0.3 10*3/uL (ref 0.1–1.0)
Monocytes Relative: 10 %
Neutro Abs: 1.8 10*3/uL (ref 1.7–7.7)
Neutrophils Relative %: 65 %
Platelet Count: 165 10*3/uL (ref 150–400)
RBC: 3.47 MIL/uL — ABNORMAL LOW (ref 4.22–5.81)
RDW: 17.2 % — ABNORMAL HIGH (ref 11.5–15.5)
WBC Count: 2.7 10*3/uL — ABNORMAL LOW (ref 4.0–10.5)
nRBC: 0 % (ref 0.0–0.2)

## 2018-09-04 LAB — CMP (CANCER CENTER ONLY)
ALT: 9 U/L (ref 0–44)
AST: 14 U/L — ABNORMAL LOW (ref 15–41)
Albumin: 3.8 g/dL (ref 3.5–5.0)
Alkaline Phosphatase: 37 U/L — ABNORMAL LOW (ref 38–126)
Anion gap: 10 (ref 5–15)
BUN: 38 mg/dL — ABNORMAL HIGH (ref 8–23)
CO2: 28 mmol/L (ref 22–32)
Calcium: 9.5 mg/dL (ref 8.9–10.3)
Chloride: 102 mmol/L (ref 98–111)
Creatinine: 1.5 mg/dL — ABNORMAL HIGH (ref 0.61–1.24)
GFR, Est AFR Am: 54 mL/min — ABNORMAL LOW (ref >60)
GFR, Estimated: 46 mL/min — ABNORMAL LOW (ref >60)
Glucose, Bld: 129 mg/dL — ABNORMAL HIGH (ref 70–99)
Potassium: 3.8 mmol/L (ref 3.5–5.1)
Sodium: 140 mmol/L (ref 135–145)
Total Bilirubin: 0.5 mg/dL (ref 0.3–1.2)
Total Protein: 6.9 g/dL (ref 6.5–8.1)

## 2018-09-04 LAB — TSH: TSH: 2.948 u[IU]/mL (ref 0.320–4.118)

## 2018-09-04 MED ORDER — SODIUM CHLORIDE 0.9 % IV SOLN
Freq: Once | INTRAVENOUS | Status: AC
  Administered 2018-09-04: 10:00:00 via INTRAVENOUS
  Filled 2018-09-04: qty 250

## 2018-09-04 MED ORDER — HEPARIN SOD (PORK) LOCK FLUSH 100 UNIT/ML IV SOLN
500.0000 [IU] | Freq: Once | INTRAVENOUS | Status: AC | PRN
  Administered 2018-09-04: 500 [IU]
  Filled 2018-09-04: qty 5

## 2018-09-04 MED ORDER — SODIUM CHLORIDE 0.9% FLUSH
10.0000 mL | INTRAVENOUS | Status: DC | PRN
  Administered 2018-09-04: 10 mL
  Filled 2018-09-04: qty 10

## 2018-09-04 MED ORDER — SODIUM CHLORIDE 0.9 % IV SOLN
10.0000 mg/kg | Freq: Once | INTRAVENOUS | Status: AC
  Administered 2018-09-04: 1120 mg via INTRAVENOUS
  Filled 2018-09-04: qty 2.4

## 2018-09-04 NOTE — Patient Instructions (Signed)
[facility] Discharge Instructions for Patients Receiving Chemotherapy  Today you received the following chemotherapy agents Durvalumab (IMFINZI).  To help prevent nausea and vomiting after your treatment, we encourage you to take your nausea medication as prescribed.   If you develop nausea and vomiting that is not controlled by your nausea medication, call the clinic.   BELOW ARE SYMPTOMS THAT SHOULD BE REPORTED IMMEDIATELY:  *FEVER GREATER THAN 100.5 F  *CHILLS WITH OR WITHOUT FEVER  NAUSEA AND VOMITING THAT IS NOT CONTROLLED WITH YOUR NAUSEA MEDICATION  *UNUSUAL SHORTNESS OF BREATH  *UNUSUAL BRUISING OR BLEEDING  TENDERNESS IN MOUTH AND THROAT WITH OR WITHOUT PRESENCE OF ULCERS  *URINARY PROBLEMS  *BOWEL PROBLEMS  UNUSUAL RASH Items with * indicate a potential emergency and should be followed up as soon as possible.  Feel free to call the clinic should you have any questions or concerns. The clinic phone number is [phone].  Please show the [place] at check-in to the Emergency Department and triage nurse.

## 2018-09-04 NOTE — Telephone Encounter (Signed)
Scheduled appt per 2/24 los - added another cycle - pt to get an updated schedule next visit.

## 2018-09-04 NOTE — Progress Notes (Signed)
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 First dose 02/27/2018.Status post 5 cycles.  CURRENT THERAPY:Consolidationimmunotherapy with Imfinzi 10 mg/kg every 2 weeks.First dose given on 05/17/2018.Status post6cycles  INTERVAL HISTORY: Darrell [name] 73 y.o. male returns to the clinic today for a follow up visit accompanied by wife. The patient is feeling well today with the exception of continued shortness of breath. The patient recently had a CT scan performed which demonstrated a large right pleural effusion. The patient had an appointment with Dr. Lamonte Sakai who arranged for a thoracentesis with cytology to be performed tomorrow on 09/05/2018. He reports associated mild cough but denies any hemoptysis, chest pain, night sweats, fevers, chills, or weight loss. He denies any headaches or visual changes. He denies any nausea, vomiting, diarrhea, or constipation. He denies any rashes or skin changes. He is tolerating his treatment with Imfinzi fairly well. He is here today for evaluation before starting cycle #7.   MEDICAL HISTORY: Past Medical History:  Diagnosis Date  . Aortic atherosclerosis ([place]) 07/18/2018  . Cancer ([place])   . Cellulitis of right lower extremity   . Chronic kidney disease   . Diabetes mellitus without complication ([place])   . Dyslipidemia   . Hypertension   . Thoracic aortic aneurysm without rupture ([place]) 07/18/2018    ALLERGIES:  is allergic to lisinopril.  MEDICATIONS:  Current Outpatient Medications  Medication Sig Dispense Refill  . albuterol (PROVENTIL HFA;VENTOLIN HFA) 108 (90 Base)  MCG/ACT inhaler Inhale 2 puffs into the lungs every 4 (four) hours as needed for wheezing or shortness of breath. 1 Inhaler 5  . ASTAXANTHIN PO Take 1 capsule by mouth daily.     . carvedilol (COREG) 6.25 MG tablet Take 6.25 mg by mouth 2 (two) times daily with a meal.     . Cinnamon Bark POWD Take 1,000 mg by mouth 2 (two) times daily.     . cloNIDine (CATAPRES) 0.1 MG tablet Take 0.1 mg by mouth 2 (two) times daily.     Marland Kitchen escitalopram (LEXAPRO) 10 MG tablet Take 10 mg by mouth daily.     . fenofibrate 160 MG tablet Take 160 mg by mouth daily.     . Glucosamine Sulfate 1000 MG TABS Take 2,000 mg by mouth daily.     Marland Kitchen glucose blood (PRECISION QID TEST) test strip by Misc.(Non-Drug; Combo Route) route.    . hydrochlorothiazide (HYDRODIURIL) 25 MG tablet Take 25 mg by mouth daily.     . Insulin Detemir (LEVEMIR FLEXTOUCH) 100 UNIT/ML Pen Inject 10 Units into the skin daily.     . Insulin Pen Needle (FIFTY50 PEN NEEDLES) 31G X 8 MM MISC USE AS DIRECTED    . Lancets MISC by Misc.(Non-Drug; Combo Route) route.    . lidocaine-prilocaine (EMLA) cream Apply 1 application topically as needed. (Patient taking differently: Apply 1 application topically as needed (port). ) 30 g 0  . losartan (COZAAR) 50 MG tablet Take 50 mg by mouth daily.     . magnesium oxide (MAG-OX) 400 MG tablet Take 400 mg by mouth daily.    . metFORMIN (GLUCOPHAGE) 1000 MG tablet Take 1,000 mg by mouth 2 (two) times daily with a meal.     .  Multiple Vitamin (MULTI-VITAMINS) TABS Take 1 tablet by mouth daily.     . Omega-3 1000 MG CAPS Take 1,200 mg by mouth 2 (two) times daily.     . pioglitazone (ACTOS) 45 MG tablet Take 45 mg by mouth daily.     . pravastatin (PRAVACHOL) 40 MG tablet Take 40 mg by mouth daily.     . prochlorperazine (COMPAZINE) 10 MG tablet TAKE 1 TABLET BY MOUTH EVERY 6 HOURS AS NEEDED FOR NAUSEA OR VOMITING (Patient taking differently: Take 10 mg by mouth every 6 (six) hours as needed for nausea or vomiting. )  385 tablet 0  . traMADol (ULTRAM) 50 MG tablet Take 1 tablet (50 mg total) by mouth every 6 (six) hours as needed for severe pain. 20 tablet 0   No current facility-administered medications for this visit.    Facility-Administered Medications Ordered in Other Visits  Medication Dose Route Frequency Provider Last Rate Last Dose  . durvalumab (IMFINZI) 1,120 mg in sodium chloride 0.9 % 100 mL chemo infusion  10 mg/kg (Treatment Plan Recorded) Intravenous Once Curt Bears, MD      . heparin lock flush 100 unit/mL  500 Units Intracatheter Once PRN Curt Bears, MD      . sodium chloride flush (NS) 0.9 % injection 10 mL  10 mL Intracatheter PRN Curt Bears, MD   10 mL at 06/28/18 1501  . sodium chloride flush (NS) 0.9 % injection 10 mL  10 mL Intracatheter PRN Curt Bears, MD        SURGICAL HISTORY:  Past Surgical History:  Procedure Laterality Date  . PORTACATH PLACEMENT Left 02/21/2018   Procedure: INSERTION PORT-A-CATH;  Surgeon: Grace Isaac, MD;  Location: [facility] OR;  Service: Thoracic;  Laterality: Left;    REVIEW OF SYSTEMS:   Review of Systems  Constitutional: Negative for appetite change, chills, fatigue, fever and unexpected weight change.  HENT:   Negative for mouth sores, nosebleeds, sore throat and trouble swallowing.   Eyes: Negative for eye problems and icterus.  Respiratory: Positive for shortness of breath. Negative for cough, hemoptysis,  and wheezing.   Cardiovascular: Negative for chest pain and leg swelling.  Gastrointestinal: Negative for abdominal pain, constipation, diarrhea, nausea and vomiting.  Genitourinary: Negative for bladder incontinence, difficulty urinating, dysuria, frequency and hematuria.   Musculoskeletal: Negative for back pain, gait problem, neck pain and neck stiffness.  Skin: Negative for itching and rash.  Neurological: Negative for dizziness, extremity weakness, gait problem, headaches, light-headedness and seizures.   Hematological: Negative for adenopathy. Does not bruise/bleed easily.  Psychiatric/Behavioral: Negative for confusion, depression and sleep disturbance. The patient is not nervous/anxious.     PHYSICAL EXAMINATION:  Blood pressure 136/72, pulse 69, temperature 98 F (36.7 C), temperature source Oral, resp. rate 18, height 5\' 9"  (1.753 m), weight 260 lb 6.4 oz (118.1 kg), SpO2 99 %.  ECOG PERFORMANCE STATUS: 1 - Symptomatic but completely ambulatory  Physical Exam  Constitutional: Oriented to person, place, and time and well-developed, well-nourished, and in no distress. No distress.  HENT:  Head: Normocephalic and atraumatic.  Mouth/Throat: Oropharynx is clear and moist. No oropharyngeal exudate.  Eyes: Conjunctivae are normal. Right eye exhibits no discharge. Left eye exhibits no discharge. No scleral icterus.  Neck: Normal range of motion. Neck supple.  Cardiovascular: Normal rate, regular rhythm, normal heart sounds and intact distal pulses.   Pulmonary/Chest: Effort normal. Diminished breath sounds in right lower lung field. No respiratory distress. No wheezes. No rales. Dullness to percussion  in right lower lung field.  Abdominal: Soft. Bowel sounds are normal. Exhibits no distension and no mass. There is no tenderness.  Musculoskeletal: Normal range of motion. Exhibits no edema.  Lymphadenopathy:    No cervical adenopathy.  Neurological: Alert and oriented to person, place, and time. Exhibits normal muscle tone. Gait normal. Coordination normal.  Skin: Skin is warm and dry. No rash noted. Not diaphoretic. No erythema. No pallor.  Psychiatric: Mood, memory and judgment normal.  Vitals reviewed.  LABORATORY DATA: Lab Results  Component Value Date   WBC 2.7 (L) 09/04/2018   HGB 10.1 (L) 09/04/2018   HCT 32.2 (L) 09/04/2018   MCV 92.8 09/04/2018   PLT 165 09/04/2018      Chemistry      Component Value Date/Time   NA 140 09/04/2018 0833   K 3.8 09/04/2018 0833   CL 102  09/04/2018 0833   CO2 28 09/04/2018 0833   BUN 38 (H) 09/04/2018 0833   CREATININE 1.50 (H) 09/04/2018 0833      Component Value Date/Time   CALCIUM 9.5 09/04/2018 0833   ALKPHOS 37 (L) 09/04/2018 0833   AST 14 (L) 09/04/2018 0833   ALT 9 09/04/2018 0833   BILITOT 0.5 09/04/2018 0833       RADIOGRAPHIC STUDIES:  Ct Chest Wo Contrast  Result Date: 08/28/2018 CLINICAL DATA:  Non-small cell lung cancer treated with chemo radiation and immunotherapy. Shortness of breath with exertion and mild cough. EXAM: CT CHEST WITHOUT CONTRAST TECHNIQUE: Multidetector CT imaging of the chest was performed following the standard protocol without IV contrast. COMPARISON:  07/18/2018. FINDINGS: Cardiovascular: Left IJ Port-A-Cath terminates in the high right atrium. Atherosclerotic calcification of the aorta, including aortic valve and coronary arteries. Ascending aorta measures 4.4 cm. Pulmonic trunk is enlarged. Heart is at the upper limits of normal in size. Decreased attenuation of the intravascular compartment is indicative of anemia. No pericardial effusion. Mediastinum/Nodes: No pathologically enlarged mediastinal or axillary lymph nodes. Hilar regions are difficult to evaluate without IV contrast. Esophagus is grossly unremarkable. Lungs/Pleura: Confluent soft tissue, bronchiectasis and architectural distortion appear more organized in the upper right hemithorax and right perihilar region. Large right pleural effusion, increased. Tiny left pleural effusion, decreased. Left lung is clear. Narrowing of the right upper lobe bronchus, slightly improved. Airway is otherwise unremarkable. Upper Abdomen: Visualized portion of the liver is unremarkable. Cholecystectomy. Right adrenal gland is unremarkable. Fat density nodule in the left adrenal gland measures 11 mm. Visualized portion of the right kidney is unremarkable. 1.6 cm low-attenuation lesion in the left kidney is likely a cyst although definitive  characterization is limited without post-contrast imaging. Visualized portions of the spleen, pancreas, stomach and bowel are grossly unremarkable. No upper abdominal adenopathy. Musculoskeletal: Degenerative changes in the spine. No worrisome lytic or sclerotic lesions. IMPRESSION: 1. Evolutionary changes of radiation therapy in the right hemithorax. No evidence of metastatic disease. 2. Large right pleural effusion, increased. Tiny left pleural effusion, decreased. 3. Ascending Aortic aneurysm NOS (ICD10-I71.9). Recommend annual imaging followup by CTA or MRA. This recommendation follows 2010 ACCF/AHA/AATS/ACR/ASA/SCA/SCAI/SIR/STS/SVM Guidelines for the Diagnosis and Management of Patients with Thoracic Aortic Disease. Circulation. 2010; 121: N989-Q119. Aortic aneurysm NOS (ICD10-I71.9). 4. Left adrenal myelolipoma. 5. Aortic atherosclerosis (ICD10-170.0). Coronary artery calcification. 6. Enlarged pulmonic trunk, indicative of pulmonary arterial hypertension. Electronically Signed   By: Lorin Picket M.D.   On: 08/28/2018 09:42     ASSESSMENT/PLAN:  This is very pleasant 73 year old Caucasian male with stage IIIb/IIIa NSCLC,  squamous cell carcinoma of the right lung. He was diagnosed in July 2019.   The patient underwent concurrent chemoradiation with weekly carboplatin and paclitaxel status post 5 cycles with a partial response after the initial induction treatment.   The patient was started on consolidation treatment with Imfinzi. He is status post 6 cycles.   The patient was seen with Dr. [name] today. He is tolerating treatment well without any adverse side effects. Recommend for him to proceed with cycle #7 today as scheduled.   The patient recently had a CT chest, abdomen, and pelvis performed. Dr. [name] personally and independently reviewed the scan and discussed the results with the patient and his wife today. The scan showed a large right pleural effusion. We recommend he proceed with  the thoracentesis with cytology tomorrow as scheduled by Dr. Lamonte Sakai.    We will see him back in 2 weeks for evaluation prior to starting cycle #8.   The patient was advised to call immediately if he has any concerning symptoms in the interval. The patient voices understanding of current disease status and treatment options and is in agreement with the current care plan. All questions were answered. The patient knows to call the clinic with any problems, questions or concerns. We can certainly see the patient much sooner if necessary   No orders of the defined types were placed in this encounter.    Perian Tedder L Guled Gahan, PA-C 09/04/18  ADDENDUM: Hematology/Oncology Attending: I had a face-to-face encounter with the patient today.  I recommended his care plan.  This is a very pleasant 73 years old white male with history of stage IIIa non-small cell lung cancer, squamous cell carcinoma status post course of induction concurrent chemoradiation with weekly carboplatin and paclitaxel with partial response.  He is currently on treatment with consolidation immunotherapy with Imfinzi status post 6 cycles.  He has been tolerating this treatment well with no concerning adverse effects.  He had repeat CT scan of the chest performed recently.  I personally and independently reviewed the scans and discussed the results with the patient and his wife today.  His scan showed no concerning findings for disease progression.  His previous symptoms of hemoptysis and shortness of breath are improving. I recommended for the patient to continue his current treatment with Imfinzi and he will proceed with cycle #7 today. He will come back for follow-up visit in 2 weeks for evaluation before starting cycle #8. The patient was advised to call immediately if he has any concerning symptoms in the interval.  Disclaimer: This note was dictated with voice recognition software. Similar sounding words can inadvertently be  transcribed and may be missed upon review. Eilleen Kempf, MD 09/04/18

## 2018-09-05 ENCOUNTER — Ambulatory Visit (HOSPITAL_COMMUNITY)
Admission: RE | Admit: 2018-09-05 | Discharge: 2018-09-05 | Disposition: A | Discharge status: Home / Self Care | Payer: Medicare Other | Source: Ambulatory Visit | Attending: Emergency Medicine | Admitting: Emergency Medicine

## 2018-09-05 ENCOUNTER — Ambulatory Visit (HOSPITAL_COMMUNITY)
Admission: RE | Admit: 2018-09-05 | Discharge: 2018-09-05 | Disposition: A | Discharge status: Home / Self Care | Payer: Medicare Other | Source: Ambulatory Visit | Attending: Student | Admitting: Student

## 2018-09-05 ENCOUNTER — Other Ambulatory Visit (HOSPITAL_COMMUNITY): Payer: Self-pay | Admitting: Student

## 2018-09-05 ENCOUNTER — Encounter (HOSPITAL_COMMUNITY): Payer: Self-pay | Admitting: Student

## 2018-09-05 DIAGNOSIS — Z85118 Personal history of other malignant neoplasm of bronchus and lung: Secondary | ICD-10-CM | POA: Insufficient documentation

## 2018-09-05 DIAGNOSIS — J90 Pleural effusion, not elsewhere classified: Secondary | ICD-10-CM

## 2018-09-05 HISTORY — PX: IR THORACENTESIS ASP PLEURAL SPACE W/IMG GUIDE: IMG5380

## 2018-09-05 LAB — BODY FLUID CELL COUNT WITH DIFFERENTIAL
Eos, Fluid: 1 %
Lymphs, Fluid: 36 %
Monocyte-Macrophage-Serous Fluid: 57 % (ref 50–90)
Neutrophil Count, Fluid: 6 % (ref 0–25)
Total Nucleated Cell Count, Fluid: 391 cu mm (ref 0–1000)

## 2018-09-05 LAB — GRAM STAIN

## 2018-09-05 MED ORDER — LIDOCAINE HCL 1 % IJ SOLN
INTRAMUSCULAR | Status: AC
  Filled 2018-09-05: qty 20

## 2018-09-05 MED ORDER — LIDOCAINE HCL (PF) 1 % IJ SOLN
INTRAMUSCULAR | Status: DC | PRN
  Administered 2018-09-05: 10 mL

## 2018-09-05 NOTE — Procedures (Signed)
PROCEDURE SUMMARY:  Successful US guided diagnostic and therapeutic right thoracentesis. Yielded 1.3 liters of yellow fluid. Pt tolerated procedure well. No immediate complications.  Specimen was sent for labs. CXR ordered.  EBL < 5 mL  Docia Barrier PA-C 09/05/2018 9:47 AM

## 2018-09-06 ENCOUNTER — Telehealth: Payer: Self-pay | Admitting: Emergency Medicine

## 2018-09-06 NOTE — Telephone Encounter (Signed)
Returned call to patient, made aware of results:   Notes recorded by Collene Gobble, MD on 09/06/2018 at 1:22 PM EST Please let the patient know that his pleural fluid cytology did not show any evidence of cancer cells. This is good news.  Voiced understanding. Nothing further is needed at this time.

## 2018-09-08 LAB — CHOLESTEROL, BODY FLUID: Cholesterol, Fluid: 40 mg/dL

## 2018-09-10 LAB — CULTURE, BODY FLUID W GRAM STAIN -BOTTLE: Culture: NO GROWTH

## 2018-09-12 ENCOUNTER — Telehealth: Payer: Self-pay | Admitting: Oncology

## 2018-09-12 NOTE — Telephone Encounter (Signed)
Darrell [name] contacted to obtain verbal, telephone consent to share their name and contact information with [employer], product/process development and team) for purposes of soliciting patient experience feedback.  Verbal consent obtained and documented on "[name] / [name] INFORMATION" form.  Darrell [name] is aware that [name] will be in contact with them at a future date for screening purposes for interviews. Please direct questions related to this process to [name] via email at [email or web address] or extension [phone].

## 2018-09-18 ENCOUNTER — Encounter: Payer: Self-pay | Admitting: Physician Assistant

## 2018-09-18 ENCOUNTER — Other Ambulatory Visit: Payer: Self-pay

## 2018-09-18 ENCOUNTER — Inpatient Hospital Stay: Payer: Medicare Other

## 2018-09-18 ENCOUNTER — Ambulatory Visit (HOSPITAL_COMMUNITY)
Admission: RE | Admit: 2018-09-18 | Discharge: 2018-09-18 | Disposition: A | Discharge status: Home / Self Care | Payer: Medicare Other | Source: Ambulatory Visit | Attending: Internal Medicine | Admitting: Internal Medicine

## 2018-09-18 ENCOUNTER — Inpatient Hospital Stay: Payer: Medicare Other | Attending: Internal Medicine | Admitting: Physician Assistant

## 2018-09-18 ENCOUNTER — Ambulatory Visit (HOSPITAL_COMMUNITY)
Admission: RE | Admit: 2018-09-18 | Discharge: 2018-09-18 | Disposition: A | Discharge status: Home / Self Care | Payer: Medicare Other | Source: Ambulatory Visit | Attending: Radiology | Admitting: Radiology

## 2018-09-18 ENCOUNTER — Ambulatory Visit (HOSPITAL_COMMUNITY)
Admission: RE | Admit: 2018-09-18 | Discharge: 2018-09-18 | Disposition: A | Discharge status: Home / Self Care | Payer: Medicare Other | Source: Ambulatory Visit | Attending: Physician Assistant | Admitting: Physician Assistant

## 2018-09-18 VITALS — BP 123/86 | HR 55 | Temp 97.5°F | Resp 18 | Ht 69.0 in | Wt 262.5 lb

## 2018-09-18 DIAGNOSIS — Z923 Personal history of irradiation: Secondary | ICD-10-CM

## 2018-09-18 DIAGNOSIS — N189 Chronic kidney disease, unspecified: Secondary | ICD-10-CM

## 2018-09-18 DIAGNOSIS — Z794 Long term (current) use of insulin: Secondary | ICD-10-CM

## 2018-09-18 DIAGNOSIS — Z79899 Other long term (current) drug therapy: Secondary | ICD-10-CM

## 2018-09-18 DIAGNOSIS — J90 Pleural effusion, not elsewhere classified: Secondary | ICD-10-CM | POA: Insufficient documentation

## 2018-09-18 DIAGNOSIS — Z9889 Other specified postprocedural states: Secondary | ICD-10-CM

## 2018-09-18 DIAGNOSIS — Z5112 Encounter for antineoplastic immunotherapy: Secondary | ICD-10-CM | POA: Diagnosis not present

## 2018-09-18 DIAGNOSIS — I129 Hypertensive chronic kidney disease with stage 1 through stage 4 chronic kidney disease, or unspecified chronic kidney disease: Secondary | ICD-10-CM

## 2018-09-18 DIAGNOSIS — I700 Atherosclerosis of aorta: Secondary | ICD-10-CM | POA: Diagnosis not present

## 2018-09-18 DIAGNOSIS — I712 Thoracic aortic aneurysm, without rupture: Secondary | ICD-10-CM

## 2018-09-18 DIAGNOSIS — E785 Hyperlipidemia, unspecified: Secondary | ICD-10-CM | POA: Diagnosis not present

## 2018-09-18 DIAGNOSIS — E1122 Type 2 diabetes mellitus with diabetic chronic kidney disease: Secondary | ICD-10-CM | POA: Diagnosis not present

## 2018-09-18 DIAGNOSIS — C3411 Malignant neoplasm of upper lobe, right bronchus or lung: Secondary | ICD-10-CM | POA: Diagnosis present

## 2018-09-18 DIAGNOSIS — R0602 Shortness of breath: Secondary | ICD-10-CM

## 2018-09-18 DIAGNOSIS — Z95828 Presence of other vascular implants and grafts: Secondary | ICD-10-CM

## 2018-09-18 DIAGNOSIS — E876 Hypokalemia: Secondary | ICD-10-CM

## 2018-09-18 LAB — CBC WITH DIFFERENTIAL (CANCER CENTER ONLY)
Abs Immature Granulocytes: 0 10*3/uL (ref 0.00–0.07)
Basophils Absolute: 0 10*3/uL (ref 0.0–0.1)
Basophils Relative: 2 %
Eosinophils Absolute: 0.2 10*3/uL (ref 0.0–0.5)
Eosinophils Relative: 8 %
HCT: 32.5 % — ABNORMAL LOW (ref 39.0–52.0)
Hemoglobin: 10.2 g/dL — ABNORMAL LOW (ref 13.0–17.0)
Immature Granulocytes: 0 %
Lymphocytes Relative: 15 %
Lymphs Abs: 0.4 10*3/uL — ABNORMAL LOW (ref 0.7–4.0)
MCH: 28.5 pg (ref 26.0–34.0)
MCHC: 31.4 g/dL (ref 30.0–36.0)
MCV: 90.8 fL (ref 80.0–100.0)
Monocytes Absolute: 0.3 10*3/uL (ref 0.1–1.0)
Monocytes Relative: 10 %
Neutro Abs: 1.7 10*3/uL (ref 1.7–7.7)
Neutrophils Relative %: 65 %
Platelet Count: 161 10*3/uL (ref 150–400)
RBC: 3.58 MIL/uL — ABNORMAL LOW (ref 4.22–5.81)
RDW: 17.3 % — ABNORMAL HIGH (ref 11.5–15.5)
WBC Count: 2.5 10*3/uL — ABNORMAL LOW (ref 4.0–10.5)
nRBC: 0 % (ref 0.0–0.2)

## 2018-09-18 LAB — COMPREHENSIVE METABOLIC PANEL
ALT: 12 U/L (ref 0–44)
AST: 20 U/L (ref 15–41)
Albumin: 3.9 g/dL (ref 3.5–5.0)
Alkaline Phosphatase: 32 U/L — ABNORMAL LOW (ref 38–126)
Anion gap: 10 (ref 5–15)
BUN: 32 mg/dL — ABNORMAL HIGH (ref 8–23)
CO2: 27 mmol/L (ref 22–32)
Calcium: 9.4 mg/dL (ref 8.9–10.3)
Chloride: 102 mmol/L (ref 98–111)
Creatinine, Ser: 1.47 mg/dL — ABNORMAL HIGH (ref 0.61–1.24)
GFR calc Af Amer: 55 mL/min — ABNORMAL LOW (ref >60)
GFR calc non Af Amer: 48 mL/min — ABNORMAL LOW (ref >60)
Glucose, Bld: 136 mg/dL — ABNORMAL HIGH (ref 70–99)
Potassium: 3.2 mmol/L — ABNORMAL LOW (ref 3.5–5.1)
Sodium: 139 mmol/L (ref 135–145)
Total Bilirubin: 0.8 mg/dL (ref 0.3–1.2)
Total Protein: 7.1 g/dL (ref 6.5–8.1)

## 2018-09-18 MED ORDER — LIDOCAINE HCL 1 % IJ SOLN
INTRAMUSCULAR | Status: AC
  Filled 2018-09-18: qty 20

## 2018-09-18 MED ORDER — HEPARIN SOD (PORK) LOCK FLUSH 100 UNIT/ML IV SOLN
500.0000 [IU] | Freq: Once | INTRAVENOUS | Status: AC | PRN
  Administered 2018-09-18: 500 [IU]
  Filled 2018-09-18: qty 5

## 2018-09-18 MED ORDER — POTASSIUM CHLORIDE CRYS ER 20 MEQ PO TBCR: 20.0000 meq | EXTENDED_RELEASE_TABLET | Freq: Every day | ORAL | 0 refills | Status: DC

## 2018-09-18 NOTE — Progress Notes (Signed)
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 First dose 02/27/2018.Status post 5 cycles.  CURRENT THERAPY: Consolidationimmunotherapy with Imfinzi 10 mg/kg every 2 weeks.First dose given on 05/17/2018.Status post7cycles  INTERVAL HISTORY: Darrell [name] 73 y.o. male returns to clinic accompanied by his wife.  The patient is feeling unwell today.  He recently had a thoracentesis for a large right pleural effusion on 09/05/2018. Cytology was negative. The patient initially felt well the days following the procedure; however, over the last 4-5 days, the patient reports feeling progressively more short of breath on exertion. For comparison, he was previously able to work and now he can hardly walk to to the bathroom without feeling short of breath. He also endorses a dry cough and occasional palpitations. He reports associated orthopnea in which he has to sleep in the recliner. He had an episode of severe shortness of breath few months ago in which he had a full cardiac workup including a stress test and EKG which was negative per patient report.  He denies associated fever, chills, night sweats, sore throat, nasal congestion, or weight loss. He denies any chest pain or hemoptysis or wheezes. He denies any nausea, vomiting, diarrhea, or constipation. He denies any headache or visual changes. He denies any recent sick contacts.   He tolerated his last treatment with Imfinzi fairly well without any adverse effects. He notes 3 new small circular, non-pruitic, red rashes on his right cheek. He is here for evaluation prior to  starting cycle #8.   MEDICAL HISTORY: Past Medical History:  Diagnosis Date  . Aortic atherosclerosis ([place]) 07/18/2018  . Cancer ([place])   . Cellulitis of right lower extremity   . Chronic kidney disease   . Diabetes mellitus without complication ([place])   . Dyslipidemia   . Hypertension   . Thoracic aortic aneurysm without rupture ([place]) 07/18/2018    ALLERGIES:  is allergic to lisinopril.  MEDICATIONS:  Current Outpatient Medications  Medication Sig Dispense Refill  . albuterol (PROVENTIL HFA;VENTOLIN HFA) 108 (90 Base) MCG/ACT inhaler Inhale 2 puffs into the lungs every 4 (four) hours as needed for wheezing or shortness of breath. 1 Inhaler 5  . ASTAXANTHIN PO Take 1 capsule by mouth daily.     . carvedilol (COREG) 6.25 MG tablet Take 6.25 mg by mouth 2 (two) times daily with a meal.     . Cinnamon Bark POWD Take 1,000 mg by mouth 2 (two) times daily.     . cloNIDine (CATAPRES) 0.1 MG tablet Take 0.1 mg by mouth 2 (two) times daily.     Marland Kitchen escitalopram (LEXAPRO) 10 MG tablet Take 10 mg by mouth daily.     . fenofibrate 160 MG tablet Take 160 mg by mouth daily.     . Glucosamine Sulfate 1000 MG TABS Take 2,000 mg by mouth daily.     Marland Kitchen glucose blood (PRECISION QID TEST) test strip by Misc.(Non-Drug; Combo Route) route.    . hydrochlorothiazide (HYDRODIURIL) 25 MG tablet Take 25 mg by mouth daily.     . Insulin Detemir (LEVEMIR FLEXTOUCH) 100 UNIT/ML Pen Inject 10 Units into the skin daily.     . Insulin Pen Needle (FIFTY50 PEN NEEDLES)  31G X 8 MM MISC USE AS DIRECTED    . Lancets MISC by Misc.(Non-Drug; Combo Route) route.    . lidocaine-prilocaine (EMLA) cream Apply 1 application topically as needed. (Patient taking differently: Apply 1 application topically as needed (port). ) 30 g 0  . losartan (COZAAR) 50 MG tablet Take 50 mg by mouth daily.     . magnesium oxide (MAG-OX) 400 MG tablet Take 400 mg by mouth daily.    . metFORMIN (GLUCOPHAGE) 1000 MG tablet Take 1,000 mg by mouth 2 (two)  times daily with a meal.     . Multiple Vitamin (MULTI-VITAMINS) TABS Take 1 tablet by mouth daily.     . Omega-3 1000 MG CAPS Take 1,200 mg by mouth 2 (two) times daily.     . pioglitazone (ACTOS) 45 MG tablet Take 45 mg by mouth daily.     . pravastatin (PRAVACHOL) 40 MG tablet Take 40 mg by mouth daily.     . prochlorperazine (COMPAZINE) 10 MG tablet TAKE 1 TABLET BY MOUTH EVERY 6 HOURS AS NEEDED FOR NAUSEA OR VOMITING (Patient taking differently: Take 10 mg by mouth every 6 (six) hours as needed for nausea or vomiting. ) 385 tablet 0  . traMADol (ULTRAM) 50 MG tablet Take 1 tablet (50 mg total) by mouth every 6 (six) hours as needed for severe pain. 20 tablet 0  . potassium chloride SA (K-DUR,KLOR-CON) 20 MEQ tablet Take 1 tablet (20 mEq total) by mouth daily. 7 tablet 0   No current facility-administered medications for this visit.    Facility-Administered Medications Ordered in Other Visits  Medication Dose Route Frequency Provider Last Rate Last Dose  . lidocaine (XYLOCAINE) 1 % (with pres) injection           . sodium chloride flush (NS) 0.9 % injection 10 mL  10 mL Intracatheter PRN Curt Bears, MD   10 mL at 06/28/18 1501    SURGICAL HISTORY:  Past Surgical History:  Procedure Laterality Date  . IR THORACENTESIS ASP PLEURAL SPACE W/IMG GUIDE  09/05/2018  . PORTACATH PLACEMENT Left 02/21/2018   Procedure: INSERTION PORT-A-CATH;  Surgeon: Grace Isaac, MD;  Location: [facility] OR;  Service: Thoracic;  Laterality: Left;    REVIEW OF SYSTEMS:   Review of Systems  Constitutional: Positive for fatigue. Negative for appetite change, chills, fever and unexpected weight change.  HENT:   Negative for mouth sores, nosebleeds, sore throat and trouble swallowing.   Eyes: Negative for eye problems and icterus.  Respiratory: Positive for dry cough, shortness of breath, and orthopnea. Negative for hemoptysis and wheezing.   Cardiovascular: Negative for chest pain and leg swelling.   Gastrointestinal: Negative for abdominal pain, constipation, diarrhea, nausea and vomiting.  Genitourinary: Negative for bladder incontinence, difficulty urinating, dysuria, frequency and hematuria.   Musculoskeletal: Negative for back pain, gait problem, neck pain and neck stiffness.  Skin: Positive for erythematous, well demarcated, skin changes to the right cheek. Negative for pruritis.   Neurological: Negative for dizziness, extremity weakness, gait problem, headaches, light-headedness and seizures.  Hematological: Negative for adenopathy. Does not bruise/bleed easily.  Psychiatric/Behavioral: Negative for confusion, depression and sleep disturbance. The patient is not nervous/anxious.     PHYSICAL EXAMINATION:  Blood pressure 123/86, pulse (!) 55, temperature (!) 97.5 F (36.4 C), temperature source Oral, resp. rate 18, height 5\' 9"  (1.753 m), weight 262 lb 8 oz (119.1 kg), SpO2 99 %.  ECOG PERFORMANCE STATUS: 2 - Symptomatic, <50% confined to bed  Physical Exam  Constitutional: Oriented to person, place, and time and well-developed, well-nourished, and in no distress. No distress.  HENT:  Head: Normocephalic and atraumatic.  Mouth/Throat: Oropharynx is clear and moist. No oropharyngeal exudate.  Eyes: Conjunctivae are normal. Right eye exhibits no discharge. Left eye exhibits no discharge. No scleral icterus.  Neck: Normal range of motion. Neck supple.  Cardiovascular: Irregular rate. Normal heart sounds and intact distal pulses.   Pulmonary/Chest: Effort normal. Absent breath sounds on right middle and lower lobe. No respiratory distress. No wheezes. No rales.  Abdominal: Soft. Bowel sounds are normal. Exhibits no distension and no mass. There is no tenderness.  Musculoskeletal: Normal range of motion. Exhibits no edema.  Lymphadenopathy:    No cervical adenopathy.  Neurological: Alert and oriented to person, place, and time. Exhibits normal muscle tone. Gait normal. Coordination  normal.  Skin: Skin is warm and dry. No rash noted. Not diaphoretic. No erythema. No pallor.  Psychiatric: Mood, memory and judgment normal.  Vitals reviewed.  LABORATORY DATA: Lab Results  Component Value Date   WBC 2.5 (L) 09/18/2018   HGB 10.2 (L) 09/18/2018   HCT 32.5 (L) 09/18/2018   MCV 90.8 09/18/2018   PLT 161 09/18/2018      Chemistry      Component Value Date/Time   NA 139 09/18/2018 0741   K 3.2 (L) 09/18/2018 0741   CL 102 09/18/2018 0741   CO2 27 09/18/2018 0741   BUN 32 (H) 09/18/2018 0741   CREATININE 1.47 (H) 09/18/2018 0741   CREATININE 1.50 (H) 09/04/2018 0833      Component Value Date/Time   CALCIUM 9.4 09/18/2018 0741   ALKPHOS 32 (L) 09/18/2018 0741   AST 20 09/18/2018 0741   AST 14 (L) 09/04/2018 0833   ALT 12 09/18/2018 0741   ALT 9 09/04/2018 0833   BILITOT 0.8 09/18/2018 0741   BILITOT 0.5 09/04/2018 0833      EKG: Normal sinus rhythm with PAC's   RADIOGRAPHIC STUDIES:  Dg Chest 1 View  Result Date: 09/18/2018 CLINICAL DATA:  Shortness of breath. History of lung cancer. EXAM: CHEST  1 VIEW COMPARISON:  09/05/2018 FINDINGS: Stable position of injectable port. Enlarged cardiac silhouette. Mediastinal contours appear intact. There is an increased large right pleural effusion. Right perihilar pulmonary mass versus airspace consolidation in seen. Osseous structures are without acute abnormality. Soft tissues are grossly normal. IMPRESSION: 1. Enlarging right pleural effusion. 2. Right perihilar pulmonary mass versus airspace consolidation, seem. Electronically Signed   By: Fidela Salisbury M.D.   On: 09/18/2018 09:16   Dg Chest 1 View  Result Date: 09/05/2018 CLINICAL DATA:  Status post right thoracentesis and history of treated right lung carcinoma. EXAM: CHEST  1 VIEW COMPARISON:  CT of the chest on 08/28/2018 FINDINGS: The heart size is normal. Port-A-Cath present. No pneumothorax after right thoracentesis. There is tenting of the right  hemidiaphragm with potential mild residual component of subpulmonic fluid. Soft tissue prominence in the right hilum related to prior treated lung carcinoma. IMPRESSION: No pneumothorax after right thoracentesis. Tenting of right hemidiaphragm with potential mild residual component of subpulmonic right pleural fluid. Soft tissue prominence in the right hilum related to the history of treated lung carcinoma. Electronically Signed   By: Aletta Edouard M.D.   On: 09/05/2018 10:05   Ct Chest Wo Contrast  Result Date: 08/28/2018 CLINICAL DATA:  Non-small cell lung cancer treated with chemo radiation and immunotherapy. Shortness of breath with exertion and mild cough. EXAM: CT CHEST WITHOUT  CONTRAST TECHNIQUE: Multidetector CT imaging of the chest was performed following the standard protocol without IV contrast. COMPARISON:  07/18/2018. FINDINGS: Cardiovascular: Left IJ Port-A-Cath terminates in the high right atrium. Atherosclerotic calcification of the aorta, including aortic valve and coronary arteries. Ascending aorta measures 4.4 cm. Pulmonic trunk is enlarged. Heart is at the upper limits of normal in size. Decreased attenuation of the intravascular compartment is indicative of anemia. No pericardial effusion. Mediastinum/Nodes: No pathologically enlarged mediastinal or axillary lymph nodes. Hilar regions are difficult to evaluate without IV contrast. Esophagus is grossly unremarkable. Lungs/Pleura: Confluent soft tissue, bronchiectasis and architectural distortion appear more organized in the upper right hemithorax and right perihilar region. Large right pleural effusion, increased. Tiny left pleural effusion, decreased. Left lung is clear. Narrowing of the right upper lobe bronchus, slightly improved. Airway is otherwise unremarkable. Upper Abdomen: Visualized portion of the liver is unremarkable. Cholecystectomy. Right adrenal gland is unremarkable. Fat density nodule in the left adrenal gland measures 11  mm. Visualized portion of the right kidney is unremarkable. 1.6 cm low-attenuation lesion in the left kidney is likely a cyst although definitive characterization is limited without post-contrast imaging. Visualized portions of the spleen, pancreas, stomach and bowel are grossly unremarkable. No upper abdominal adenopathy. Musculoskeletal: Degenerative changes in the spine. No worrisome lytic or sclerotic lesions. IMPRESSION: 1. Evolutionary changes of radiation therapy in the right hemithorax. No evidence of metastatic disease. 2. Large right pleural effusion, increased. Tiny left pleural effusion, decreased. 3. Ascending Aortic aneurysm NOS (ICD10-I71.9). Recommend annual imaging followup by CTA or MRA. This recommendation follows 2010 ACCF/AHA/AATS/ACR/ASA/SCA/SCAI/SIR/STS/SVM Guidelines for the Diagnosis and Management of Patients with Thoracic Aortic Disease. Circulation. 2010; 121: T016-W109. Aortic aneurysm NOS (ICD10-I71.9). 4. Left adrenal myelolipoma. 5. Aortic atherosclerosis (ICD10-170.0). Coronary artery calcification. 6. Enlarged pulmonic trunk, indicative of pulmonary arterial hypertension. Electronically Signed   By: Lorin Picket M.D.   On: 08/28/2018 09:42   Ir Thoracentesis Asp Pleural Space W/img Guide  Result Date: 09/05/2018 INDICATION: Patient with history of lung cancer, now with right pleural effusion. Request is made for diagnostic and therapeutic thoracentesis. EXAM: ULTRASOUND GUIDED DIAGNOSTIC AND THERAPEUTIC RIGHT THORACENTESIS MEDICATIONS: 10 mL 1% lidocaine COMPLICATIONS: None immediate. PROCEDURE: An ultrasound guided thoracentesis was thoroughly discussed with the patient and questions answered. The benefits, risks, alternatives and complications were also discussed. The patient understands and wishes to proceed with the procedure. Written consent was obtained. Ultrasound was performed to localize and mark an adequate pocket of fluid in the right chest. The area was then  prepped and draped in the normal sterile fashion. 1% Lidocaine was used for local anesthesia. Under ultrasound guidance a 6 Fr Safe-T-Centesis catheter was introduced. Thoracentesis was performed. The catheter was removed and a dressing applied. FINDINGS: A total of approximately 1.3 liters of yellow fluid was removed. Samples were sent to the laboratory as requested by the clinical team. IMPRESSION: Successful ultrasound guided diagnostic and therapeutic right thoracentesis yielding 1.3 liters of pleural fluid. Read by: Brynda Greathouse PA-C Electronically Signed   By: Aletta Edouard M.D.   On: 09/05/2018 09:49     ASSESSMENT/PLAN:  This is a very pleasant 73 year old Caucasian male with stage IIIb/IIIa non-small cell lung cancer, squamous cell carcinoma of the right lung.  He was diagnosed in July 2019.  Patient underwent concurrent chemoradiation with weekly carboplatin and paclitaxel status post 5 cycles with a partial response after the initial induction treatment. The patient is currently undergoing consolidation immunotherapy with Imfinzi 10 mg/kg IV every 2 weeks.  He is tolerating treatment well without any adverse effects.  The patient was seen with Dr. [name] today.   The patient is feeling progressively more short of breath today. Physical exam revealed absent breath sounds in the right middle/lower lung field. Patient had a stat chest x ray and EKG performed. Chest x-ray demonstrated an enlarging right pleural effusion. We have arranged for an ultrasound guided thoracentesis with cytology to be performed today. We will hold treatment this week.  I will see him back in 2 weeks for evaluation prior to starting cycle #8.  The patient's potassium was 3.2 today. I have sent a prescription of 20 meq of KCl p.o. daily for 7 days to the patients pharmacy for hypokalemia.   The patient was advised to call immediately if he has any concerning symptoms in the interval. The patient voices understanding  of current disease status and treatment options and is in agreement with the current care plan. All questions were answered. The patient knows to call the clinic with any problems, questions or concerns. We can certainly see the patient much sooner if necessary   Orders Placed This Encounter  Procedures  . DG Chest 1 View    Standing Status:   Future    Number of Occurrences:   1    Standing Expiration Date:   09/18/2019    Order Specific Question:   Reason for Exam (SYMPTOM  OR DIAGNOSIS REQUIRED)    Answer:   Shortness of breath    Order Specific Question:   Preferred imaging location?    Answer:   [facility]    Order Specific Question:   Radiology Contrast Protocol - do NOT remove file path    Answer:   [email or web address]  . US Thoracentesis Asp Pleural space w/IMG guide    Standing Status:   Future    Number of Occurrences:   1    Standing Expiration Date:   09/18/2019    Order Specific Question:   Are labs required for specimen collection?    Answer:   Yes    Order Specific Question:   Lab orders requested (DO NOT place separate lab orders, these will be automatically ordered during procedure specimen collection):    Answer:   Cytology - Non Pap    Order Specific Question:   Reason for Exam (SYMPTOM  OR DIAGNOSIS REQUIRED)    Answer:   Therapeutic and diagnostic right sided ultrasound guided thoracentesis    Order Specific Question:   Preferred imaging location?    Answer:   [place], PA-C 09/18/18  ADDENDUM: Hematology/Oncology Attending: I had a face-to-face encounter with the patient today.  I recommended his care plan.  This is a very pleasant 73 years old white male with a stage IIIb non-small cell lung cancer, squamous cell carcinoma status post induction concurrent chemoradiation and currently undergoing treatment with immunotherapy with Imfinzi status post 7 cycles.   The patient has been tolerating the treatment well but he has been complaining of increasing shortness of breath with minimal exertion.  He underwent ultrasound-guided thoracentesis few weeks ago and felt better initially and then started declining again. I recommended for the patient to have repeat chest x-ray performed today. After the x-ray showed recurrence of his right pleural effusion, will arrange for the patient to have ultrasound-guided thoracentesis and will also send the fluid for cytologic evaluation.  If she continues to have recurrence of  the pleural effusion, I will refer him to cardiothoracic surgery for consideration of right Pleurx catheter placement. I will delay the start of cycle #8 of his treatment by 2 weeks until improvement of his condition. The patient was advised to call immediately if he has any concerning symptoms in the interval.  Disclaimer: This note was dictated with voice recognition software. Similar sounding words can inadvertently be transcribed and may be missed upon review. Eilleen Kempf, MD 09/18/18

## 2018-09-18 NOTE — Procedures (Signed)
Ultrasound-guided diagnostic and therapeutic right thoracentesis performed yielding 1.9 liters of yellow fluid. No immediate complications. Follow-up chest x-ray pending. A portion of the fluid was sent to the lab for cytology. EBL < 2cc.

## 2018-09-18 NOTE — Addendum Note (Signed)
Addended by: Carlene Coria L on: 09/18/2018 12:02 PM   Modules accepted: Orders

## 2018-10-02 ENCOUNTER — Encounter: Payer: Self-pay | Admitting: Physician Assistant

## 2018-10-02 ENCOUNTER — Inpatient Hospital Stay: Payer: Medicare Other

## 2018-10-02 ENCOUNTER — Inpatient Hospital Stay (HOSPITAL_BASED_OUTPATIENT_CLINIC_OR_DEPARTMENT_OTHER): Payer: Medicare Other | Admitting: Physician Assistant

## 2018-10-02 ENCOUNTER — Telehealth: Payer: Self-pay | Admitting: Physician Assistant

## 2018-10-02 ENCOUNTER — Other Ambulatory Visit: Payer: Self-pay

## 2018-10-02 VITALS — BP 115/81 | HR 70 | Temp 97.6°F | Resp 18 | Ht 69.0 in | Wt 256.7 lb

## 2018-10-02 DIAGNOSIS — C3411 Malignant neoplasm of upper lobe, right bronchus or lung: Secondary | ICD-10-CM

## 2018-10-02 DIAGNOSIS — Z95828 Presence of other vascular implants and grafts: Secondary | ICD-10-CM

## 2018-10-02 DIAGNOSIS — R5382 Chronic fatigue, unspecified: Secondary | ICD-10-CM

## 2018-10-02 DIAGNOSIS — Z5112 Encounter for antineoplastic immunotherapy: Secondary | ICD-10-CM

## 2018-10-02 LAB — CBC WITH DIFFERENTIAL (CANCER CENTER ONLY)
Abs Immature Granulocytes: 0.01 10*3/uL (ref 0.00–0.07)
Basophils Absolute: 0 10*3/uL (ref 0.0–0.1)
Basophils Relative: 1 %
Eosinophils Absolute: 0.2 10*3/uL (ref 0.0–0.5)
Eosinophils Relative: 8 %
HCT: 32.9 % — ABNORMAL LOW (ref 39.0–52.0)
Hemoglobin: 10.4 g/dL — ABNORMAL LOW (ref 13.0–17.0)
Immature Granulocytes: 0 %
Lymphocytes Relative: 13 %
Lymphs Abs: 0.4 10*3/uL — ABNORMAL LOW (ref 0.7–4.0)
MCH: 28.4 pg (ref 26.0–34.0)
MCHC: 31.6 g/dL (ref 30.0–36.0)
MCV: 89.9 fL (ref 80.0–100.0)
Monocytes Absolute: 0.3 10*3/uL (ref 0.1–1.0)
Monocytes Relative: 11 %
Neutro Abs: 1.9 10*3/uL (ref 1.7–7.7)
Neutrophils Relative %: 67 %
Platelet Count: 161 10*3/uL (ref 150–400)
RBC: 3.66 MIL/uL — ABNORMAL LOW (ref 4.22–5.81)
RDW: 17 % — ABNORMAL HIGH (ref 11.5–15.5)
WBC Count: 2.8 10*3/uL — ABNORMAL LOW (ref 4.0–10.5)
nRBC: 0 % (ref 0.0–0.2)

## 2018-10-02 LAB — CMP (CANCER CENTER ONLY)
ALT: 11 U/L (ref 0–44)
AST: 15 U/L (ref 15–41)
Albumin: 3.5 g/dL (ref 3.5–5.0)
Alkaline Phosphatase: 34 U/L — ABNORMAL LOW (ref 38–126)
Anion gap: 11 (ref 5–15)
BUN: 32 mg/dL — ABNORMAL HIGH (ref 8–23)
CO2: 25 mmol/L (ref 22–32)
Calcium: 9.3 mg/dL (ref 8.9–10.3)
Chloride: 102 mmol/L (ref 98–111)
Creatinine: 1.47 mg/dL — ABNORMAL HIGH (ref 0.61–1.24)
GFR, Est AFR Am: 55 mL/min — ABNORMAL LOW (ref >60)
GFR, Estimated: 48 mL/min — ABNORMAL LOW (ref >60)
Glucose, Bld: 157 mg/dL — ABNORMAL HIGH (ref 70–99)
Potassium: 3.7 mmol/L (ref 3.5–5.1)
Sodium: 138 mmol/L (ref 135–145)
Total Bilirubin: 0.6 mg/dL (ref 0.3–1.2)
Total Protein: 6.7 g/dL (ref 6.5–8.1)

## 2018-10-02 LAB — TSH: TSH: 2.214 u[IU]/mL (ref 0.320–4.118)

## 2018-10-02 MED ORDER — SODIUM CHLORIDE 0.9 % IV SOLN
Freq: Once | INTRAVENOUS | Status: AC
  Administered 2018-10-02: 13:00:00 via INTRAVENOUS
  Filled 2018-10-02: qty 250

## 2018-10-02 MED ORDER — SODIUM CHLORIDE 0.9% FLUSH
10.0000 mL | INTRAVENOUS | Status: DC | PRN
  Administered 2018-10-02: 10 mL
  Filled 2018-10-02: qty 10

## 2018-10-02 MED ORDER — HEPARIN SOD (PORK) LOCK FLUSH 100 UNIT/ML IV SOLN
500.0000 [IU] | Freq: Once | INTRAVENOUS | Status: AC | PRN
  Administered 2018-10-02: 500 [IU]
  Filled 2018-10-02: qty 5

## 2018-10-02 MED ORDER — SODIUM CHLORIDE 0.9 % IV SOLN
10.0000 mg/kg | Freq: Once | INTRAVENOUS | Status: AC
  Administered 2018-10-02: 1120 mg via INTRAVENOUS
  Filled 2018-10-02: qty 20

## 2018-10-02 NOTE — Progress Notes (Signed)
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 First dose 02/27/2018.Status post 5 cycles.  CURRENT THERAPY: Consolidationimmunotherapy with Imfinzi 10 mg/kg every 2 weeks.First dose given on 05/17/2018.Status post7cycles  INTERVAL HISTORY: Darrell [name] 73 y.o. male returns to the clinic for a follow-up visit.  The patient recently had a recurrent large right pleural effusion which required a thoracentesis.  Cytology was negative for malignant cells on both occasions.  Today the patient feels that his breathing has improved; however, he still does complain of some dyspnea on exertion and is not quite back to his baseline.  The patient denies any fever, chills, night sweats, or weight loss.  He denies any nausea, vomiting, diarrhea, or constipation.  He denies any chest pain, cough, or hemoptysis.  The patient had an episode of pneumonia approximately 6 weeks ago in which he presented with hemoptysis and shortness of breath. He has not had anymore hemoptysis since this time.  He denies any headache or visual changes.  He denies any rashes or skin changes.  His last treatment with Imfinzi was delayed secondary to scheduling his thoracentesis; however, he thus far has tolerated treatment well without any adverse effects.  The patient is here for evaluation prior to starting cycle #8 today.    MEDICAL HISTORY: Past Medical History:  Diagnosis Date  . Aortic atherosclerosis ([place]) 07/18/2018  . Cancer ([place])   . Cellulitis of right lower extremity   . Chronic kidney disease   . Diabetes mellitus without complication  ([place])   . Dyslipidemia   . Hypertension   . Thoracic aortic aneurysm without rupture ([place]) 07/18/2018    ALLERGIES:  is allergic to lisinopril.  MEDICATIONS:  Current Outpatient Medications  Medication Sig Dispense Refill  . albuterol (PROVENTIL HFA;VENTOLIN HFA) 108 (90 Base) MCG/ACT inhaler Inhale 2 puffs into the lungs every 4 (four) hours as needed for wheezing or shortness of breath. 1 Inhaler 5  . ASTAXANTHIN PO Take 1 capsule by mouth daily.     . carvedilol (COREG) 6.25 MG tablet Take 6.25 mg by mouth 2 (two) times daily with a meal.     . Cinnamon Bark POWD Take 1,000 mg by mouth 2 (two) times daily.     . cloNIDine (CATAPRES) 0.1 MG tablet Take 0.1 mg by mouth 2 (two) times daily.     Marland Kitchen escitalopram (LEXAPRO) 10 MG tablet Take 10 mg by mouth daily.     . fenofibrate 160 MG tablet Take 160 mg by mouth daily.     . Glucosamine Sulfate 1000 MG TABS Take 2,000 mg by mouth daily.     Marland Kitchen glucose blood (PRECISION QID TEST) test strip by Misc.(Non-Drug; Combo Route) route.    . hydrochlorothiazide (HYDRODIURIL) 25 MG tablet Take 25 mg by mouth daily.     . Insulin Detemir (LEVEMIR FLEXTOUCH) 100 UNIT/ML Pen Inject 10 Units into the skin daily.     . Insulin Pen Needle (FIFTY50 PEN NEEDLES) 31G X 8 MM MISC USE AS DIRECTED    . Lancets MISC by Misc.(Non-Drug; Combo Route) route.    . lidocaine-prilocaine (EMLA) cream Apply 1 application topically as needed. (Patient taking differently: Apply 1 application topically as needed (  port). ) 30 g 0  . losartan (COZAAR) 50 MG tablet Take 50 mg by mouth daily.     . magnesium oxide (MAG-OX) 400 MG tablet Take 400 mg by mouth daily.    . metFORMIN (GLUCOPHAGE) 1000 MG tablet Take 1,000 mg by mouth 2 (two) times daily with a meal.     . Multiple Vitamin (MULTI-VITAMINS) TABS Take 1 tablet by mouth daily.     . Omega-3 1000 MG CAPS Take 1,200 mg by mouth 2 (two) times daily.     . pioglitazone (ACTOS) 45 MG tablet Take 45 mg by mouth daily.     .  potassium chloride SA (K-DUR,KLOR-CON) 20 MEQ tablet Take 1 tablet (20 mEq total) by mouth daily. 7 tablet 0  . pravastatin (PRAVACHOL) 40 MG tablet Take 40 mg by mouth daily.     . prochlorperazine (COMPAZINE) 10 MG tablet TAKE 1 TABLET BY MOUTH EVERY 6 HOURS AS NEEDED FOR NAUSEA OR VOMITING (Patient taking differently: Take 10 mg by mouth every 6 (six) hours as needed for nausea or vomiting. ) 385 tablet 0  . traMADol (ULTRAM) 50 MG tablet Take 1 tablet (50 mg total) by mouth every 6 (six) hours as needed for severe pain. 20 tablet 0   No current facility-administered medications for this visit.    Facility-Administered Medications Ordered in Other Visits  Medication Dose Route Frequency Provider Last Rate Last Dose  . sodium chloride flush (NS) 0.9 % injection 10 mL  10 mL Intracatheter PRN Curt Bears, MD   10 mL at 06/28/18 1501    SURGICAL HISTORY:  Past Surgical History:  Procedure Laterality Date  . IR THORACENTESIS ASP PLEURAL SPACE W/IMG GUIDE  09/05/2018  . PORTACATH PLACEMENT Left 02/21/2018   Procedure: INSERTION PORT-A-CATH;  Surgeon: Grace Isaac, MD;  Location: [facility] OR;  Service: Thoracic;  Laterality: Left;    REVIEW OF SYSTEMS:   Review of Systems  Constitutional: Negative for appetite change, chills, fatigue, fever and unexpected weight change.  HENT:   Negative for mouth sores, nosebleeds, sore throat and trouble swallowing.   Eyes: Negative for eye problems and icterus.  Respiratory: Positive for dyspnea on exertion. Negative for cough, hemoptysis,  and wheezing.   Cardiovascular: Negative for chest pain and leg swelling.  Gastrointestinal: Negative for abdominal pain, constipation, diarrhea, nausea and vomiting.  Genitourinary: Negative for bladder incontinence, difficulty urinating, dysuria, frequency and hematuria.   Musculoskeletal: Negative for back pain, gait problem, neck pain and neck stiffness.  Skin: Negative for itching and rash.  Neurological:  Negative for dizziness, extremity weakness, gait problem, headaches, light-headedness and seizures.  Hematological: Negative for adenopathy. Does not bruise/bleed easily.  Psychiatric/Behavioral: Negative for confusion, depression and sleep disturbance. The patient is not nervous/anxious.     PHYSICAL EXAMINATION:  Blood pressure 115/81, pulse 70, temperature 97.6 F (36.4 C), temperature source Oral, resp. rate 18, height 5\' 9"  (1.753 m), weight 256 lb 11.2 oz (116.4 kg), SpO2 99 %.  ECOG PERFORMANCE STATUS: 1 - Symptomatic but completely ambulatory  Physical Exam  Constitutional: Oriented to person, place, and time and well-developed, well-nourished, and in no distress. No distress.  HENT:  Head: Normocephalic and atraumatic.  Mouth/Throat: Oropharynx is clear and moist. No oropharyngeal exudate.  Eyes: Conjunctivae are normal. Right eye exhibits no discharge. Left eye exhibits no discharge. No scleral icterus.  Neck: Normal range of motion. Neck supple.  Cardiovascular: Normal rate, regular rhythm, normal heart sounds and intact distal pulses.   Pulmonary/Chest:  Absent breath sounds on lower right lobe. All other lung fields clear to ascultation. Effort normal. No respiratory distress. No wheezes. No rales.  Abdominal: Soft. Bowel sounds are normal. Exhibits no distension and no mass. There is no tenderness.  Musculoskeletal: Normal range of motion. Exhibits no edema.  Lymphadenopathy:    No cervical adenopathy.  Neurological: Alert and oriented to person, place, and time. Exhibits normal muscle tone. Gait normal. Coordination normal.  Skin: Skin is warm and dry. No rash noted. Not diaphoretic. No erythema. No pallor.  Psychiatric: Mood, memory and judgment normal.  Vitals reviewed.  LABORATORY DATA: Lab Results  Component Value Date   WBC 2.8 (L) 10/02/2018   HGB 10.4 (L) 10/02/2018   HCT 32.9 (L) 10/02/2018   MCV 89.9 10/02/2018   PLT 161 10/02/2018      Chemistry       Component Value Date/Time   NA 138 10/02/2018 1030   K 3.7 10/02/2018 1030   CL 102 10/02/2018 1030   CO2 25 10/02/2018 1030   BUN 32 (H) 10/02/2018 1030   CREATININE 1.47 (H) 10/02/2018 1030      Component Value Date/Time   CALCIUM 9.3 10/02/2018 1030   ALKPHOS 34 (L) 10/02/2018 1030   AST 15 10/02/2018 1030   ALT 11 10/02/2018 1030   BILITOT 0.6 10/02/2018 1030       RADIOGRAPHIC STUDIES:  Dg Chest 1 View  Result Date: 09/18/2018 CLINICAL DATA:  Status post right thoracentesis today.  Lung cancer. EXAM: CHEST  1 VIEW COMPARISON:  Single-view of the chest 09/18/2018. CT chest 08/28/2018. FINDINGS: Right pleural effusion seen on the prior examination is markedly decreased in size after thoracentesis. No pneumothorax. Right perihilar mass lesion is identified as seen on the prior CT. The left lung is clear. Heart size is normal. Port-A-Cath is in place. IMPRESSION: Marked decrease in right effusion after thoracentesis. Negative for pneumothorax or other new abnormality. Electronically Signed   By: Inge Rise M.D.   On: 09/18/2018 11:33   Dg Chest 1 View  Result Date: 09/18/2018 CLINICAL DATA:  Shortness of breath. History of lung cancer. EXAM: CHEST  1 VIEW COMPARISON:  09/05/2018 FINDINGS: Stable position of injectable port. Enlarged cardiac silhouette. Mediastinal contours appear intact. There is an increased large right pleural effusion. Right perihilar pulmonary mass versus airspace consolidation in seen. Osseous structures are without acute abnormality. Soft tissues are grossly normal. IMPRESSION: 1. Enlarging right pleural effusion. 2. Right perihilar pulmonary mass versus airspace consolidation, seem. Electronically Signed   By: Fidela Salisbury M.D.   On: 09/18/2018 09:16   Dg Chest 1 View  Result Date: 09/05/2018 CLINICAL DATA:  Status post right thoracentesis and history of treated right lung carcinoma. EXAM: CHEST  1 VIEW COMPARISON:  CT of the chest on 08/28/2018  FINDINGS: The heart size is normal. Port-A-Cath present. No pneumothorax after right thoracentesis. There is tenting of the right hemidiaphragm with potential mild residual component of subpulmonic fluid. Soft tissue prominence in the right hilum related to prior treated lung carcinoma. IMPRESSION: No pneumothorax after right thoracentesis. Tenting of right hemidiaphragm with potential mild residual component of subpulmonic right pleural fluid. Soft tissue prominence in the right hilum related to the history of treated lung carcinoma. Electronically Signed   By: Aletta Edouard M.D.   On: 09/05/2018 10:05   Ir Thoracentesis Asp Pleural Space W/img Guide  Result Date: 09/05/2018 INDICATION: Patient with history of lung cancer, now with right pleural effusion. Request is made for  diagnostic and therapeutic thoracentesis. EXAM: ULTRASOUND GUIDED DIAGNOSTIC AND THERAPEUTIC RIGHT THORACENTESIS MEDICATIONS: 10 mL 1% lidocaine COMPLICATIONS: None immediate. PROCEDURE: An ultrasound guided thoracentesis was thoroughly discussed with the patient and questions answered. The benefits, risks, alternatives and complications were also discussed. The patient understands and wishes to proceed with the procedure. Written consent was obtained. Ultrasound was performed to localize and mark an adequate pocket of fluid in the right chest. The area was then prepped and draped in the normal sterile fashion. 1% Lidocaine was used for local anesthesia. Under ultrasound guidance a 6 Fr Safe-T-Centesis catheter was introduced. Thoracentesis was performed. The catheter was removed and a dressing applied. FINDINGS: A total of approximately 1.3 liters of yellow fluid was removed. Samples were sent to the laboratory as requested by the clinical team. IMPRESSION: Successful ultrasound guided diagnostic and therapeutic right thoracentesis yielding 1.3 liters of pleural fluid. Read by: Brynda Greathouse PA-C Electronically Signed   By: Aletta Edouard M.D.   On: 09/05/2018 09:49   US Thoracentesis Asp Pleural Space W/img Guide  Result Date: 09/18/2018 INDICATION: Patient with history of squamous cell carcinoma of the right lung, dyspnea, recurrent right pleural effusion. Request made for diagnostic and therapeutic right thoracentesis. EXAM: ULTRASOUND GUIDED DIAGNOSTIC AND THERAPEUTIC RIGHT THORACENTESIS MEDICATIONS: None COMPLICATIONS: None immediate. PROCEDURE: An ultrasound guided thoracentesis was thoroughly discussed with the patient and questions answered. The benefits, risks, alternatives and complications were also discussed. The patient understands and wishes to proceed with the procedure. Written consent was obtained. Ultrasound was performed to localize and mark an adequate pocket of fluid in the right chest. The area was then prepped and draped in the normal sterile fashion. 1% Lidocaine was used for local anesthesia. Under ultrasound guidance a 6 Fr Safe-T-Centesis catheter was introduced. Thoracentesis was performed. The catheter was removed and a dressing applied. FINDINGS: A total of approximately 1.8 liters of yellow fluid was removed. Samples were sent to the laboratory as requested by the clinical team. IMPRESSION: Successful ultrasound guided diagnostic and therapeutic right thoracentesis yielding 1.8 liters of pleural fluid. Read by: Rowe Robert, PA-C Electronically Signed   By: Jacqulynn Cadet M.D.   On: 09/18/2018 11:50     ASSESSMENT/PLAN:  This is a very pleasant 73 year old Caucasian male with stage IIIb/IIIa non-small cell lung cancer, squamous cell carcinoma of the right lung.  He was diagnosed in July 2019.  The patient underwent concurrent chemoradiation with weekly carboplatin and paclitaxel. He is status post 5 cycles with a partial response after the initial induction treatment. The patient is currently undergoing consolidation immunotherapy with Imfinzi 10 mg/kg IV every 2 weeks.  He is tolerating treatment  well without any adverse effects.  He is status post 7 cycles.   The patient was seen with Dr. [name] today.  Since his last thoracentesis, the patient states his breathing has improved, however, he still experiences dyspnea on exertion and is not quite at his baseline. Cytology was negative both occasions for malignant cells. After discussion at the interdisciplinary team meetings, Dr. [name] recommends a PET scan to further evaluate whether he shows evidence of disease progression vs. an inflammatory process. I will arrange for a PET scan to be performed prior to his next appointment in 2 weeks.   The patient will proceed with cycle #8 of Imfinzi today as scheduled.   Regarding his recurrent pleural effusions, the patient was advised to call the office should he develop new or worsening shortness of breath before his next appointment. We  can certainly evaluate and arrange for management should he develop signs and symptoms of reaccumulation. Otherwise, I will see the patient back in 2 weeks for evaluation and to discuss the scan results.   The patient was advised to call immediately if he has any concerning symptoms in the interval. The patient voices understanding of current disease status and treatment options and is in agreement with the current care plan. All questions were answered. The patient knows to call the clinic with any problems, questions or concerns. We can certainly see the patient much sooner if necessary.    Orders Placed This Encounter  Procedures  . NM PET Image Restag (PS) Skull Base To Thigh    Standing Status:   Future    Standing Expiration Date:   10/02/2019    Order Specific Question:   ** REASON FOR EXAM (FREE TEXT)    Answer:   Restaging cancer    Order Specific Question:   If indicated for the ordered procedure, I authorize the administration of a radiopharmaceutical per Radiology protocol    Answer:   Yes    Order Specific Question:   Preferred imaging location?     Answer:   [facility]    Order Specific Question:   Radiology Contrast Protocol - do NOT remove file path    Answer:   [email or web address]     Arthi Mcdonald L Granger Chui, PA-C 10/02/18  ADDENDUM: Hematology/Oncology Attending: I had a face-to-face encounter with the patient.  I recommended his care plan.  This is a very pleasant 73 years old white male with stage IIIa non-small cell lung cancer, squamous cell carcinoma status post a course of concurrent chemoradiation and he is currently undergoing consolidation treatment with immunotherapy with Imfinzi status post 7 cycles.  The patient has been tolerating his treatment with Imfinzi fairly well with no concerning adverse effects.  He has few episodes of hemoptysis recently and was treated for postobstructive pneumonia with improvement of his condition.  He also had recurrent right pleural effusion that was drained and the final cytology showed no malignant cells and was consistent with reactive mesothelial cells.  His recent CT scan of the chest was reviewed at the weekly thoracic conference and there is concern about disease progression in the right upper lobe area.  Recommendation at that time was consideration of repeat bronchoscopy as well as PET scan for further evaluation of this area. I discussed these recommendation with the patient and his wife who was available by phone. The patient and his wife agreed to proceed with the PET scan at this point.  We will see him back for follow-up visit in 2 weeks for evaluation and depending on the PET scan results will discuss further recommendation regarding treatment of his condition. The patient was advised to call immediately if he has any concerning symptoms in the interval especially any significant shortness of breath as he may need repeat right-sided thoracentesis. The patient was advised to call immediately if he has any concerning symptoms in the  interval.  Disclaimer: This note was dictated with voice recognition software. Similar sounding words can inadvertently be transcribed and may be missed upon review. Eilleen Kempf, MD 10/03/18

## 2018-10-02 NOTE — Telephone Encounter (Signed)
Scheduled appt per 3/23 los.  Printed calendar and avs.

## 2018-10-02 NOTE — Patient Instructions (Signed)
[facility] Discharge Instructions for Patients Receiving Chemotherapy  Today you received the following chemotherapy agents: Imfinzi.  To help prevent nausea and vomiting after your treatment, we encourage you to take your nausea medication as directed.   If you develop nausea and vomiting that is not controlled by your nausea medication, call the clinic.   BELOW ARE SYMPTOMS THAT SHOULD BE REPORTED IMMEDIATELY:  *FEVER GREATER THAN 100.5 F  *CHILLS WITH OR WITHOUT FEVER  NAUSEA AND VOMITING THAT IS NOT CONTROLLED WITH YOUR NAUSEA MEDICATION  *UNUSUAL SHORTNESS OF BREATH  *UNUSUAL BRUISING OR BLEEDING  TENDERNESS IN MOUTH AND THROAT WITH OR WITHOUT PRESENCE OF ULCERS  *URINARY PROBLEMS  *BOWEL PROBLEMS  UNUSUAL RASH Items with * indicate a potential emergency and should be followed up as soon as possible.  Feel free to call the clinic should you have any questions or concerns. The clinic phone number is [phone].  Please show the CHEMO ALERT CARD at check-in to the Emergency Department and triage nurse.   

## 2018-10-03 ENCOUNTER — Telehealth: Payer: Self-pay | Admitting: Internal Medicine

## 2018-10-03 NOTE — Telephone Encounter (Signed)
Per 3/23 was suppose to add cassie for the f/u visit for 04/20.  Was not able to, the time slot was already taken.  Spoke with Cassie and she went and spoke with MM and she stated that MM said to move his appts to the 21st at any time for a f/u.  Patient aware of appt date and time.

## 2018-10-09 ENCOUNTER — Encounter: Payer: Self-pay | Admitting: Internal Medicine

## 2018-10-10 ENCOUNTER — Other Ambulatory Visit: Payer: Self-pay | Admitting: Internal Medicine

## 2018-10-10 ENCOUNTER — Telehealth: Payer: Self-pay | Admitting: *Deleted

## 2018-10-10 DIAGNOSIS — C3411 Malignant neoplasm of upper lobe, right bronchus or lung: Secondary | ICD-10-CM

## 2018-10-10 NOTE — Telephone Encounter (Signed)
TCT patient in response to a MY Chart message. He states he is noticing his SOB  Getting progressively worse. It is at times, difficult to finish a complete sentence without taking extra breaths. He states he is sleeping pretty well.He thinks he needs another thoracentesis. Discussed with Dr. [name]. Received order for patient to have right sided thoracentesis @ [facility] this week. Will call [facility] to schedule and call call patient back

## 2018-10-10 NOTE — Telephone Encounter (Signed)
Thoracentesis is scheduled for 8:45 am on 10/11/18. TCT patient and made him aware of this. No questions or concerns. Pt voiced understanding

## 2018-10-11 ENCOUNTER — Ambulatory Visit (HOSPITAL_COMMUNITY)
Admission: RE | Admit: 2018-10-11 | Discharge: 2018-10-11 | Disposition: A | Discharge status: Home / Self Care | Payer: Medicare Other | Source: Ambulatory Visit | Attending: Internal Medicine | Admitting: Internal Medicine

## 2018-10-11 ENCOUNTER — Other Ambulatory Visit: Payer: Self-pay | Admitting: Internal Medicine

## 2018-10-11 ENCOUNTER — Encounter (HOSPITAL_COMMUNITY): Payer: Self-pay | Admitting: Student

## 2018-10-11 ENCOUNTER — Other Ambulatory Visit: Payer: Self-pay

## 2018-10-11 DIAGNOSIS — C3411 Malignant neoplasm of upper lobe, right bronchus or lung: Secondary | ICD-10-CM

## 2018-10-11 DIAGNOSIS — J90 Pleural effusion, not elsewhere classified: Secondary | ICD-10-CM | POA: Insufficient documentation

## 2018-10-11 HISTORY — PX: IR THORACENTESIS ASP PLEURAL SPACE W/IMG GUIDE: IMG5380

## 2018-10-11 MED ORDER — LIDOCAINE HCL (PF) 1 % IJ SOLN
INTRAMUSCULAR | Status: AC | PRN
  Administered 2018-10-11: 10 mL

## 2018-10-11 MED ORDER — LIDOCAINE HCL 1 % IJ SOLN
INTRAMUSCULAR | Status: AC
  Filled 2018-10-11: qty 20

## 2018-10-11 NOTE — Procedures (Signed)
PROCEDURE SUMMARY:  Successful US guided diagnostic and therapeutic right thoracentesis. Yielded 1.7 liters of yellow fluid. Pt tolerated procedure well. No immediate complications.  Specimen was sent for labs. CXR ordered.  EBL < 5 mL  Docia Barrier PA-C 10/11/2018 9:55 AM

## 2018-10-13 ENCOUNTER — Other Ambulatory Visit: Payer: Self-pay

## 2018-10-13 ENCOUNTER — Ambulatory Visit (HOSPITAL_COMMUNITY)
Admission: RE | Admit: 2018-10-13 | Discharge: 2018-10-13 | Disposition: A | Discharge status: Home / Self Care | Payer: Medicare Other | Source: Ambulatory Visit | Attending: Physician Assistant | Admitting: Physician Assistant

## 2018-10-13 DIAGNOSIS — Z923 Personal history of irradiation: Secondary | ICD-10-CM | POA: Insufficient documentation

## 2018-10-13 DIAGNOSIS — I2510 Atherosclerotic heart disease of native coronary artery without angina pectoris: Secondary | ICD-10-CM | POA: Diagnosis not present

## 2018-10-13 DIAGNOSIS — I700 Atherosclerosis of aorta: Secondary | ICD-10-CM | POA: Diagnosis not present

## 2018-10-13 DIAGNOSIS — J90 Pleural effusion, not elsewhere classified: Secondary | ICD-10-CM | POA: Diagnosis not present

## 2018-10-13 DIAGNOSIS — C3411 Malignant neoplasm of upper lobe, right bronchus or lung: Secondary | ICD-10-CM | POA: Diagnosis not present

## 2018-10-13 LAB — GLUCOSE, CAPILLARY: Glucose-Capillary: 110 mg/dL — ABNORMAL HIGH (ref 70–99)

## 2018-10-13 MED ORDER — FLUDEOXYGLUCOSE F - 18 (FDG) INJECTION
12.7600 | Freq: Once | INTRAVENOUS | Status: AC | PRN
  Administered 2018-10-13: 09:00:00 12.76 via INTRAVENOUS

## 2018-10-16 ENCOUNTER — Encounter: Payer: Self-pay | Admitting: Internal Medicine

## 2018-10-16 ENCOUNTER — Other Ambulatory Visit: Payer: Self-pay

## 2018-10-16 ENCOUNTER — Inpatient Hospital Stay: Payer: Medicare Other | Attending: Internal Medicine

## 2018-10-16 ENCOUNTER — Inpatient Hospital Stay: Payer: Medicare Other

## 2018-10-16 ENCOUNTER — Inpatient Hospital Stay (HOSPITAL_BASED_OUTPATIENT_CLINIC_OR_DEPARTMENT_OTHER): Payer: Medicare Other | Admitting: Internal Medicine

## 2018-10-16 VITALS — BP 125/78 | HR 73 | Temp 97.8°F | Resp 18 | Ht 69.0 in | Wt 255.3 lb

## 2018-10-16 DIAGNOSIS — I712 Thoracic aortic aneurysm, without rupture: Secondary | ICD-10-CM | POA: Insufficient documentation

## 2018-10-16 DIAGNOSIS — Z79899 Other long term (current) drug therapy: Secondary | ICD-10-CM

## 2018-10-16 DIAGNOSIS — E119 Type 2 diabetes mellitus without complications: Secondary | ICD-10-CM

## 2018-10-16 DIAGNOSIS — I129 Hypertensive chronic kidney disease with stage 1 through stage 4 chronic kidney disease, or unspecified chronic kidney disease: Secondary | ICD-10-CM | POA: Insufficient documentation

## 2018-10-16 DIAGNOSIS — I6523 Occlusion and stenosis of bilateral carotid arteries: Secondary | ICD-10-CM

## 2018-10-16 DIAGNOSIS — N189 Chronic kidney disease, unspecified: Secondary | ICD-10-CM | POA: Diagnosis not present

## 2018-10-16 DIAGNOSIS — I700 Atherosclerosis of aorta: Secondary | ICD-10-CM

## 2018-10-16 DIAGNOSIS — C3411 Malignant neoplasm of upper lobe, right bronchus or lung: Secondary | ICD-10-CM

## 2018-10-16 DIAGNOSIS — Z794 Long term (current) use of insulin: Secondary | ICD-10-CM | POA: Insufficient documentation

## 2018-10-16 DIAGNOSIS — E785 Hyperlipidemia, unspecified: Secondary | ICD-10-CM

## 2018-10-16 DIAGNOSIS — Z95828 Presence of other vascular implants and grafts: Secondary | ICD-10-CM

## 2018-10-16 DIAGNOSIS — D175 Benign lipomatous neoplasm of intra-abdominal organs: Secondary | ICD-10-CM | POA: Insufficient documentation

## 2018-10-16 DIAGNOSIS — J90 Pleural effusion, not elsewhere classified: Secondary | ICD-10-CM | POA: Insufficient documentation

## 2018-10-16 DIAGNOSIS — Z5112 Encounter for antineoplastic immunotherapy: Secondary | ICD-10-CM | POA: Insufficient documentation

## 2018-10-16 LAB — CMP (CANCER CENTER ONLY)
ALT: 9 U/L (ref 0–44)
AST: 14 U/L — ABNORMAL LOW (ref 15–41)
Albumin: 3.4 g/dL — ABNORMAL LOW (ref 3.5–5.0)
Alkaline Phosphatase: 37 U/L — ABNORMAL LOW (ref 38–126)
Anion gap: 9 (ref 5–15)
BUN: 26 mg/dL — ABNORMAL HIGH (ref 8–23)
CO2: 28 mmol/L (ref 22–32)
Calcium: 9.3 mg/dL (ref 8.9–10.3)
Chloride: 102 mmol/L (ref 98–111)
Creatinine: 1.4 mg/dL — ABNORMAL HIGH (ref 0.61–1.24)
GFR, Est AFR Am: 59 mL/min — ABNORMAL LOW (ref >60)
GFR, Estimated: 51 mL/min — ABNORMAL LOW (ref >60)
Glucose, Bld: 117 mg/dL — ABNORMAL HIGH (ref 70–99)
Potassium: 3.3 mmol/L — ABNORMAL LOW (ref 3.5–5.1)
Sodium: 139 mmol/L (ref 135–145)
Total Bilirubin: 0.6 mg/dL (ref 0.3–1.2)
Total Protein: 6.8 g/dL (ref 6.5–8.1)

## 2018-10-16 LAB — CBC WITH DIFFERENTIAL (CANCER CENTER ONLY)
Abs Immature Granulocytes: 0.01 10*3/uL (ref 0.00–0.07)
Basophils Absolute: 0 10*3/uL (ref 0.0–0.1)
Basophils Relative: 1 %
Eosinophils Absolute: 0.3 10*3/uL (ref 0.0–0.5)
Eosinophils Relative: 11 %
HCT: 32.5 % — ABNORMAL LOW (ref 39.0–52.0)
Hemoglobin: 10.3 g/dL — ABNORMAL LOW (ref 13.0–17.0)
Immature Granulocytes: 0 %
Lymphocytes Relative: 15 %
Lymphs Abs: 0.4 10*3/uL — ABNORMAL LOW (ref 0.7–4.0)
MCH: 28.4 pg (ref 26.0–34.0)
MCHC: 31.7 g/dL (ref 30.0–36.0)
MCV: 89.5 fL (ref 80.0–100.0)
Monocytes Absolute: 0.3 10*3/uL (ref 0.1–1.0)
Monocytes Relative: 10 %
Neutro Abs: 1.7 10*3/uL (ref 1.7–7.7)
Neutrophils Relative %: 63 %
Platelet Count: 176 10*3/uL (ref 150–400)
RBC: 3.63 MIL/uL — ABNORMAL LOW (ref 4.22–5.81)
RDW: 17.1 % — ABNORMAL HIGH (ref 11.5–15.5)
WBC Count: 2.7 10*3/uL — ABNORMAL LOW (ref 4.0–10.5)
nRBC: 0 % (ref 0.0–0.2)

## 2018-10-16 MED ORDER — SODIUM CHLORIDE 0.9% FLUSH
10.0000 mL | INTRAVENOUS | Status: DC | PRN
  Administered 2018-10-16: 10 mL
  Filled 2018-10-16: qty 10

## 2018-10-16 MED ORDER — SODIUM CHLORIDE 0.9 % IV SOLN
10.0000 mg/kg | Freq: Once | INTRAVENOUS | Status: AC
  Administered 2018-10-16: 1120 mg via INTRAVENOUS
  Filled 2018-10-16: qty 2.4

## 2018-10-16 MED ORDER — HEPARIN SOD (PORK) LOCK FLUSH 100 UNIT/ML IV SOLN
500.0000 [IU] | Freq: Once | INTRAVENOUS | Status: AC | PRN
  Administered 2018-10-16: 500 [IU]
  Filled 2018-10-16: qty 5

## 2018-10-16 MED ORDER — SODIUM CHLORIDE 0.9 % IV SOLN
Freq: Once | INTRAVENOUS | Status: AC
  Administered 2018-10-16: 12:00:00 via INTRAVENOUS
  Filled 2018-10-16: qty 250

## 2018-10-16 NOTE — Patient Instructions (Signed)
Coronavirus (COVID-19) Are you at risk?  Are you at risk for the Coronavirus (COVID-19)?  To be considered HIGH RISK for Coronavirus (COVID-19), you have to meet the following criteria:  . Traveled to [place], [place], [place], [place] or [place]; or in the [place] to [place], [place], [place], or [place]; and have fever, cough, and shortness of breath within the last 2 weeks of travel OR . Been in close contact with a person diagnosed with COVID-19 within the last 2 weeks and have fever, cough, and shortness of breath . IF YOU DO NOT MEET THESE CRITERIA, YOU ARE CONSIDERED LOW RISK FOR COVID-19.  What to do if you are HIGH RISK for COVID-19?  . If you are having a medical emergency, call 911. . Seek medical care right away. Before you go to a doctor's office, urgent care or emergency department, call ahead and tell them about your recent travel, contact with someone diagnosed with COVID-19, and your symptoms. You should receive instructions from your physician's office regarding next steps of care.  . When you arrive at healthcare provider, tell the healthcare staff immediately you have returned from visiting [place], [place], [place], [place] or [place]; or traveled in the [place] to [place], [place], [place], or [place]; in the last two weeks or you have been in close contact with a person diagnosed with COVID-19 in the last 2 weeks.   . Tell the health care staff about your symptoms: fever, cough and shortness of breath. . After you have been seen by a medical provider, you will be either: o Tested for (COVID-19) and discharged home on quarantine except to seek medical care if symptoms worsen, and asked to  - Stay home and avoid contact with others until you get your results (4-5 days)  - Avoid travel on public transportation if possible (such as bus, train, or airplane) or o Sent to the Emergency Department by EMS for evaluation, COVID-19 testing, and possible  admission depending on your condition and test results.  What to do if you are LOW RISK for COVID-19?  Reduce your risk of any infection by using the same precautions used for avoiding the common cold or flu:  . Wash your hands often with soap and warm water for at least 20 seconds.  If soap and water are not readily available, use an alcohol-based hand sanitizer with at least 60% alcohol.  . If coughing or sneezing, cover your mouth and nose by coughing or sneezing into the elbow areas of your shirt or coat, into a tissue or into your sleeve (not your hands). . Avoid shaking hands with others and consider head nods or verbal greetings only. . Avoid touching your eyes, nose, or mouth with unwashed hands.  . Avoid close contact with people who are sick. . Avoid places or events with large numbers of people in one location, like concerts or sporting events. . Carefully consider travel plans you have or are making. . If you are planning any travel outside or inside the [place], visit the CDC's Travelers' Health webpage for the latest health notices. . If you have some symptoms but not all symptoms, continue to monitor at home and seek medical attention if your symptoms worsen. . If you are having a medical emergency, call 911.   ADDITIONAL HEALTHCARE OPTIONS FOR PATIENTS  [facility] / e-Visit: [email or web address]         [facility]: [phone]  [facility]   [facility]: [facility]: [facility] Discharge Instructions for Patients Receiving Chemotherapy  Today you received the following chemotherapy agents Imfinzi.  To help prevent nausea and vomiting after your treatment, we encourage you to take your nausea medication as directed.  If you develop nausea and vomiting that is not controlled by your nausea medication, call the clinic.   BELOW ARE  SYMPTOMS THAT SHOULD BE REPORTED IMMEDIATELY:  *FEVER GREATER THAN 100.5 F  *CHILLS WITH OR WITHOUT FEVER  NAUSEA AND VOMITING THAT IS NOT CONTROLLED WITH YOUR NAUSEA MEDICATION  *UNUSUAL SHORTNESS OF BREATH  *UNUSUAL BRUISING OR BLEEDING  TENDERNESS IN MOUTH AND THROAT WITH OR WITHOUT PRESENCE OF ULCERS  *URINARY PROBLEMS  *BOWEL PROBLEMS  UNUSUAL RASH Items with * indicate a potential emergency and should be followed up as soon as possible.  Feel free to call the clinic should you have any questions or concerns. The clinic phone number is [phone].  Please show the [facility] at check-in to the Emergency Department and triage nurse.

## 2018-10-16 NOTE — Progress Notes (Signed)
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 First dose 02/27/2018.Status post 5 cycles.  CURRENT THERAPY:Consolidationimmunotherapy with Imfinzi 10 mg/kg every 2 weeks.First dose given on 05/17/2018.Status post 8 cycles.  INTERVAL HISTORY: Darrell [name] 73 y.o. male returns to the clinic today for follow-up visit and his wife was available by phone.  The patient is feeling fine today with no concerning complaints except for mild shortness of breath.  He underwent ultrasound-guided thoracentesis on October 11, 2018 with drainage of 1.7 L of pleural fluid.  The final cytology was negative for malignancy.  The patient also had a PET scan performed recently and he is here for evaluation and discussion of his PET scan results.  He continues to tolerate his treatment with Imfinzi fairly well.  He denied having any nausea, vomiting, diarrhea or constipation.  He denied having any skin rash.  He has no headache or visual changes.  He has no significant weight loss or night sweats.   MEDICAL HISTORY: Past Medical History:  Diagnosis Date  . Aortic atherosclerosis ([place]) 07/18/2018  . Cancer ([place])   . Cellulitis of right lower extremity   . Chronic kidney disease   . Diabetes mellitus without complication ([place])   . Dyslipidemia   . Hypertension   . Thoracic aortic aneurysm without rupture ([place]) 07/18/2018    ALLERGIES:  is allergic to lisinopril.  MEDICATIONS:  Current Outpatient Medications  Medication Sig Dispense Refill  . albuterol (PROVENTIL HFA;VENTOLIN  HFA) 108 (90 Base) MCG/ACT inhaler Inhale 2 puffs into the lungs every 4 (four) hours as needed for wheezing or shortness of breath. 1 Inhaler 5  . ASTAXANTHIN PO Take 1 capsule by mouth daily.     . carvedilol (COREG) 6.25 MG tablet Take 6.25 mg by mouth 2 (two) times daily with a meal.     . Cinnamon Bark POWD Take 1,000 mg by mouth 2 (two) times daily.     . cloNIDine (CATAPRES) 0.1 MG tablet Take 0.1 mg by mouth 2 (two) times daily.     Marland Kitchen escitalopram (LEXAPRO) 10 MG tablet Take 10 mg by mouth daily.     . fenofibrate 160 MG tablet Take 160 mg by mouth daily.     . Glucosamine Sulfate 1000 MG TABS Take 2,000 mg by mouth daily.     Marland Kitchen glucose blood (PRECISION QID TEST) test strip by Misc.(Non-Drug; Combo Route) route.    . hydrochlorothiazide (HYDRODIURIL) 25 MG tablet Take 25 mg by mouth daily.     . Insulin Detemir (LEVEMIR FLEXTOUCH) 100 UNIT/ML Pen Inject 10 Units into the skin daily.     . Insulin Pen Needle (FIFTY50 PEN NEEDLES) 31G X 8 MM MISC USE AS DIRECTED    . Lancets MISC by Misc.(Non-Drug; Combo Route) route.    . lidocaine-prilocaine (EMLA) cream Apply 1 application topically as needed. (Patient taking differently: Apply 1 application topically as needed (port). ) 30 g 0  . losartan (COZAAR) 50 MG tablet Take 50 mg by mouth daily.     . magnesium oxide (MAG-OX) 400 MG tablet Take 400 mg by mouth daily.    Marland Kitchen  metFORMIN (GLUCOPHAGE) 1000 MG tablet Take 1,000 mg by mouth 2 (two) times daily with a meal.     . Multiple Vitamin (MULTI-VITAMINS) TABS Take 1 tablet by mouth daily.     . Omega-3 1000 MG CAPS Take 1,200 mg by mouth 2 (two) times daily.     . pioglitazone (ACTOS) 45 MG tablet Take 45 mg by mouth daily.     . potassium chloride SA (K-DUR,KLOR-CON) 20 MEQ tablet Take 1 tablet (20 mEq total) by mouth daily. 7 tablet 0  . pravastatin (PRAVACHOL) 40 MG tablet Take 40 mg by mouth daily.     . prochlorperazine (COMPAZINE) 10 MG tablet TAKE 1 TABLET BY MOUTH EVERY 6 HOURS AS  NEEDED FOR NAUSEA OR VOMITING (Patient taking differently: Take 10 mg by mouth every 6 (six) hours as needed for nausea or vomiting. ) 385 tablet 0  . traMADol (ULTRAM) 50 MG tablet Take 1 tablet (50 mg total) by mouth every 6 (six) hours as needed for severe pain. 20 tablet 0   No current facility-administered medications for this visit.    Facility-Administered Medications Ordered in Other Visits  Medication Dose Route Frequency Provider Last Rate Last Dose  . sodium chloride flush (NS) 0.9 % injection 10 mL  10 mL Intracatheter PRN Curt Bears, MD   10 mL at 06/28/18 1501  . sodium chloride flush (NS) 0.9 % injection 10 mL  10 mL Intracatheter PRN Curt Bears, MD   10 mL at 10/16/18 1052    SURGICAL HISTORY:  Past Surgical History:  Procedure Laterality Date  . IR THORACENTESIS ASP PLEURAL SPACE W/IMG GUIDE  09/05/2018  . IR THORACENTESIS ASP PLEURAL SPACE W/IMG GUIDE  10/11/2018  . PORTACATH PLACEMENT Left 02/21/2018   Procedure: INSERTION PORT-A-CATH;  Surgeon: Grace Isaac, MD;  Location: [facility] OR;  Service: Thoracic;  Laterality: Left;    REVIEW OF SYSTEMS:  Constitutional: negative Eyes: negative Ears, nose, mouth, throat, and face: negative Respiratory: positive for dyspnea on exertion Cardiovascular: negative Gastrointestinal: negative Genitourinary:negative Integument/breast: negative Hematologic/lymphatic: negative Musculoskeletal:negative Neurological: negative Behavioral/Psych: negative Endocrine: negative Allergic/Immunologic: negative   PHYSICAL EXAMINATION: General appearance: alert, cooperative and no distress Head: Normocephalic, without obvious abnormality, atraumatic Neck: no adenopathy, no JVD, supple, symmetrical, trachea midline and thyroid not enlarged, symmetric, no tenderness/mass/nodules Lymph nodes: Cervical, supraclavicular, and axillary nodes normal. Resp: clear to auscultation bilaterally Back: symmetric, no curvature. ROM normal. No  CVA tenderness. Cardio: regular rate and rhythm, S1, S2 normal, no murmur, click, rub or gallop GI: soft, non-tender; bowel sounds normal; no masses,  no organomegaly Extremities: extremities normal, atraumatic, no cyanosis or edema Neurologic: Alert and oriented X 3, normal strength and tone. Normal symmetric reflexes. Normal coordination and gait  ECOG PERFORMANCE STATUS: 1 - Symptomatic but completely ambulatory  Blood pressure 125/78, pulse 73, temperature 97.8 F (36.6 C), temperature source Oral, resp. rate 18, height 5\' 9"  (1.753 m), weight 255 lb 4.8 oz (115.8 kg), SpO2 98 %.  LABORATORY DATA: Lab Results  Component Value Date   WBC 2.7 (L) 10/16/2018   HGB 10.3 (L) 10/16/2018   HCT 32.5 (L) 10/16/2018   MCV 89.5 10/16/2018   PLT 176 10/16/2018      Chemistry      Component Value Date/Time   NA 138 10/02/2018 1030   K 3.7 10/02/2018 1030   CL 102 10/02/2018 1030   CO2 25 10/02/2018 1030   BUN 32 (H) 10/02/2018 1030   CREATININE 1.47 (H) 10/02/2018 1030  Component Value Date/Time   CALCIUM 9.3 10/02/2018 1030   ALKPHOS 34 (L) 10/02/2018 1030   AST 15 10/02/2018 1030   ALT 11 10/02/2018 1030   BILITOT 0.6 10/02/2018 1030       RADIOGRAPHIC STUDIES: Dg Chest 1 View  Result Date: 10/11/2018 CLINICAL DATA:  Post right thoracentesis EXAM: CHEST  1 VIEW COMPARISON:  09/18/2018 FINDINGS: Moderate right pleural effusion noted. No pneumothorax following thoracentesis. Right lower lobe atelectasis or infiltrate. Mild cardiomegaly. No confluent opacity on the left. Left Port-A-Cath is unchanged. IMPRESSION: Moderate right pleural effusion without pneumothorax. Right lower lobe atelectasis or infiltrate. Electronically Signed   By: Rolm Baptise M.D.   On: 10/11/2018 09:47   Dg Chest 1 View  Result Date: 09/18/2018 CLINICAL DATA:  Status post right thoracentesis today.  Lung cancer. EXAM: CHEST  1 VIEW COMPARISON:  Single-view of the chest 09/18/2018. CT chest 08/28/2018.  FINDINGS: Right pleural effusion seen on the prior examination is markedly decreased in size after thoracentesis. No pneumothorax. Right perihilar mass lesion is identified as seen on the prior CT. The left lung is clear. Heart size is normal. Port-A-Cath is in place. IMPRESSION: Marked decrease in right effusion after thoracentesis. Negative for pneumothorax or other new abnormality. Electronically Signed   By: Inge Rise M.D.   On: 09/18/2018 11:33   Dg Chest 1 View  Result Date: 09/18/2018 CLINICAL DATA:  Shortness of breath. History of lung cancer. EXAM: CHEST  1 VIEW COMPARISON:  09/05/2018 FINDINGS: Stable position of injectable port. Enlarged cardiac silhouette. Mediastinal contours appear intact. There is an increased large right pleural effusion. Right perihilar pulmonary mass versus airspace consolidation in seen. Osseous structures are without acute abnormality. Soft tissues are grossly normal. IMPRESSION: 1. Enlarging right pleural effusion. 2. Right perihilar pulmonary mass versus airspace consolidation, seem. Electronically Signed   By: Fidela Salisbury M.D.   On: 09/18/2018 09:16   Nm Pet Image Restag (ps) Skull Base To Thigh  Result Date: 10/13/2018 CLINICAL DATA:  Subsequent treatment strategy for right upper lobe lung cancer. Status post chemotherapy and immunotherapy. EXAM: NUCLEAR MEDICINE PET SKULL BASE TO THIGH TECHNIQUE: 12.8 mCi F-18 FDG was injected intravenously. Full-ring PET imaging was performed from the skull base to thigh after the radiotracer. CT data was obtained and used for attenuation correction and anatomic localization. Fasting blood glucose: 110 mg/dl COMPARISON:  09/18/2018 chest radiograph. CT 08/28/2018. No prior PET. FINDINGS: Mediastinal blood pool activity: SUV max 3.0 NECK: No areas of abnormal hypermetabolism. Incidental CT findings: No cervical adenopathy. Bilateral carotid atherosclerosis. CHEST: Right perihilar geographic consolidation with low-level  hypermetabolism, likely radiation induced. This is grossly similar in CT appearance to the 08/28/2018 exam. Measures a S.U.V. max of 4.4. No localized hypermetabolism to suggest locally recurrent or residual disease. No thoracic nodal hypermetabolism. Incidental CT findings: Moderate right pleural effusion, similar. Tiny left pleural effusion has resolved. Left Port-A-Cath tip at high right atrium. Mild cardiomegaly. Coronary and aortic atherosclerosis. Pulmonary artery enlargement again suggest pulmonary arterial hypertension. Favor atelectasis along the left major fissure on image 38/8. ABDOMEN/PELVIS: No abdominopelvic parenchymal or nodal hypermetabolism. Incidental CT findings: 8 mm left adrenal myelolipoma. Cholecystectomy. 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 Prostatectomy and pelvic node dissection. SKELETON: Hypermetabolism about the greater trochanters of the femurs is likely due to trochanteric bursitis. No abnormal marrow activity. Incidental CT findings: Bilateral L5 pars defects. Grade 1 L5-S1 anterolisthesis. Thoracic spondylosis. IMPRESSION: 1. Right-sided radiation changes, without findings of recurrent or metastatic disease. 2. Similar moderate  right pleural effusion. 3. Coronary artery atherosclerosis. Aortic Atherosclerosis (ICD10-I70.0). 4. Pulmonary artery enlargement suggests pulmonary arterial hypertension. Electronically Signed   By: Abigail Miyamoto M.D.   On: 10/13/2018 11:09   Ir Thoracentesis Asp Pleural Space W/img Guide  Result Date: 10/11/2018 INDICATION: Patient with history of right pleural effusion. Request is made for diagnostic and therapeutic thoracentesis. EXAM: ULTRASOUND GUIDED RIGHT DIAGNOSTIC AND THERAPEUTIC THORACENTESIS MEDICATIONS: 10 mL 1% lidocaine COMPLICATIONS: None immediate. PROCEDURE: An ultrasound guided thoracentesis was thoroughly discussed with the patient and questions answered. The benefits, risks, alternatives and complications were also discussed. The  patient understands and wishes to proceed with the procedure. Written consent was obtained. Ultrasound was performed to localize and mark an adequate pocket of fluid in the right chest. The area was then prepped and draped in the normal sterile fashion. 1% Lidocaine was used for local anesthesia. Under ultrasound guidance a 6 Fr Safe-T-Centesis catheter was introduced. Thoracentesis was performed. The catheter was removed and a dressing applied. FINDINGS: A total of approximately 1.7 liters of yellow fluid was removed. Samples were sent to the laboratory as requested by the clinical team. IMPRESSION: Successful ultrasound guided diagnostic and therapeutic right thoracentesis yielding 1.7 liters of pleural fluid. Read by: Brynda Greathouse PA-C Electronically Signed   By: Sandi Mariscal M.D.   On: 10/11/2018 12:07   US Thoracentesis Asp Pleural Space W/img Guide  Result Date: 09/18/2018 INDICATION: Patient with history of squamous cell carcinoma of the right lung, dyspnea, recurrent right pleural effusion. Request made for diagnostic and therapeutic right thoracentesis. EXAM: ULTRASOUND GUIDED DIAGNOSTIC AND THERAPEUTIC RIGHT THORACENTESIS MEDICATIONS: None COMPLICATIONS: None immediate. PROCEDURE: An ultrasound guided thoracentesis was thoroughly discussed with the patient and questions answered. The benefits, risks, alternatives and complications were also discussed. The patient understands and wishes to proceed with the procedure. Written consent was obtained. Ultrasound was performed to localize and mark an adequate pocket of fluid in the right chest. The area was then prepped and draped in the normal sterile fashion. 1% Lidocaine was used for local anesthesia. Under ultrasound guidance a 6 Fr Safe-T-Centesis catheter was introduced. Thoracentesis was performed. The catheter was removed and a dressing applied. FINDINGS: A total of approximately 1.8 liters of yellow fluid was removed. Samples were sent to the  laboratory as requested by the clinical team. IMPRESSION: Successful ultrasound guided diagnostic and therapeutic right thoracentesis yielding 1.8 liters of pleural fluid. Read by: Rowe Robert, PA-C Electronically Signed   By: Jacqulynn Cadet M.D.   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 The patient is currently undergoing a course of concurrent chemoradiation with weekly carboplatin and paclitaxel status post 5 cycles.  He had partial response after the initial induction treatment. The patient was started on treatment with consolidation Imfinzi status post 8 cycles.  He has been tolerating this treatment well with no concerning adverse effects. He had a PET scan performed recently.  I personally and independently reviewed the scan images and discussed the results with the patient and his wife today. His scan showed no concerning findings for disease progression or new malignancy. I recommended for the patient to continue his current treatment with Imfinzi and he will proceed with cycle #9 today. For the recurrent right pleural effusion, we will consider the patient for repeat paracentesis on as-needed basis.  We may also consider the  patient for right Pleurx placement in the future after improvement of the COVID-19 pandemic. The patient will come back for follow-up visit in 2 weeks for evaluation before starting cycle #10. He was advised to call immediately if he has any concerning symptoms in the interval. The patient voices understanding of current disease status and treatment options and is in agreement with the current care plan. All questions were answered. The patient knows to call the clinic with any problems, questions or concerns. We can certainly see the patient much sooner if necessary.  Disclaimer: This note was dictated with voice  recognition software. Similar sounding words can inadvertently be transcribed and may not be corrected upon review.

## 2018-10-27 ENCOUNTER — Telehealth: Payer: Self-pay | Admitting: *Deleted

## 2018-10-27 ENCOUNTER — Other Ambulatory Visit: Payer: Self-pay | Admitting: Physician Assistant

## 2018-10-27 DIAGNOSIS — J90 Pleural effusion, not elsewhere classified: Secondary | ICD-10-CM

## 2018-10-27 NOTE — Telephone Encounter (Signed)
Received TC from patient. He states he believes he needs another thoracentesis. He has had 3 done in the last  3 weeks. Notified Cassie Heilingoetter, PA and she placed order for procedure.  Call made to central Scheduling. Pt will have thoracentesis done Monday, 10/30/18 at [facility] @ 8am. TCT patient and made him aware of date and time

## 2018-10-30 ENCOUNTER — Ambulatory Visit (HOSPITAL_COMMUNITY)
Admission: RE | Admit: 2018-10-30 | Discharge: 2018-10-30 | Disposition: A | Discharge status: Home / Self Care | Payer: Medicare Other | Source: Ambulatory Visit | Attending: Physician Assistant | Admitting: Physician Assistant

## 2018-10-30 ENCOUNTER — Ambulatory Visit (HOSPITAL_COMMUNITY)
Admission: RE | Admit: 2018-10-30 | Discharge: 2018-10-30 | Disposition: A | Discharge status: Home / Self Care | Payer: Medicare Other | Source: Ambulatory Visit | Attending: Student | Admitting: Student

## 2018-10-30 ENCOUNTER — Other Ambulatory Visit (HOSPITAL_COMMUNITY): Payer: Self-pay | Admitting: Student

## 2018-10-30 ENCOUNTER — Ambulatory Visit: Payer: Medicare Other

## 2018-10-30 ENCOUNTER — Encounter (HOSPITAL_COMMUNITY): Payer: Self-pay | Admitting: Student

## 2018-10-30 ENCOUNTER — Ambulatory Visit (HOSPITAL_COMMUNITY): Payer: Medicare Other

## 2018-10-30 ENCOUNTER — Other Ambulatory Visit: Payer: Medicare Other

## 2018-10-30 ENCOUNTER — Other Ambulatory Visit: Payer: Self-pay

## 2018-10-30 DIAGNOSIS — Z9889 Other specified postprocedural states: Secondary | ICD-10-CM

## 2018-10-30 DIAGNOSIS — J90 Pleural effusion, not elsewhere classified: Secondary | ICD-10-CM | POA: Diagnosis present

## 2018-10-30 HISTORY — PX: IR THORACENTESIS ASP PLEURAL SPACE W/IMG GUIDE: IMG5380

## 2018-10-30 MED ORDER — LIDOCAINE HCL 1 % IJ SOLN
INTRAMUSCULAR | Status: AC
  Filled 2018-10-30: qty 20

## 2018-10-30 MED ORDER — LIDOCAINE HCL (PF) 1 % IJ SOLN
INTRAMUSCULAR | Status: DC | PRN
  Administered 2018-10-30: 5 mL

## 2018-10-30 NOTE — Procedures (Signed)
PROCEDURE SUMMARY:  Successful image-guided right thoracentesis. Yielded 2.1 liters of clear gold fluid. Procedure was stopped at 2.1 liters due to patient coughing. Patient tolerated procedure well. No immediate complications. EBL = 0 mL.  Specimen was not sent for labs. CXR ordered.  Claris Pong Debroah Shuttleworth PA-C 10/30/2018 8:59 AM

## 2018-10-31 ENCOUNTER — Encounter: Payer: Self-pay | Admitting: Internal Medicine

## 2018-10-31 ENCOUNTER — Inpatient Hospital Stay: Payer: Medicare Other

## 2018-10-31 ENCOUNTER — Inpatient Hospital Stay (HOSPITAL_BASED_OUTPATIENT_CLINIC_OR_DEPARTMENT_OTHER): Payer: Medicare Other | Admitting: Internal Medicine

## 2018-10-31 ENCOUNTER — Other Ambulatory Visit: Payer: Self-pay

## 2018-10-31 VITALS — BP 143/87 | HR 79 | Temp 98.2°F | Resp 20 | Ht 69.0 in | Wt 256.2 lb

## 2018-10-31 DIAGNOSIS — Z794 Long term (current) use of insulin: Secondary | ICD-10-CM

## 2018-10-31 DIAGNOSIS — Z5112 Encounter for antineoplastic immunotherapy: Secondary | ICD-10-CM | POA: Diagnosis not present

## 2018-10-31 DIAGNOSIS — C3411 Malignant neoplasm of upper lobe, right bronchus or lung: Secondary | ICD-10-CM | POA: Diagnosis not present

## 2018-10-31 DIAGNOSIS — I6523 Occlusion and stenosis of bilateral carotid arteries: Secondary | ICD-10-CM

## 2018-10-31 DIAGNOSIS — J90 Pleural effusion, not elsewhere classified: Secondary | ICD-10-CM

## 2018-10-31 DIAGNOSIS — Z79899 Other long term (current) drug therapy: Secondary | ICD-10-CM

## 2018-10-31 DIAGNOSIS — D175 Benign lipomatous neoplasm of intra-abdominal organs: Secondary | ICD-10-CM

## 2018-10-31 DIAGNOSIS — I712 Thoracic aortic aneurysm, without rupture: Secondary | ICD-10-CM

## 2018-10-31 DIAGNOSIS — Z95828 Presence of other vascular implants and grafts: Secondary | ICD-10-CM

## 2018-10-31 DIAGNOSIS — I129 Hypertensive chronic kidney disease with stage 1 through stage 4 chronic kidney disease, or unspecified chronic kidney disease: Secondary | ICD-10-CM

## 2018-10-31 DIAGNOSIS — I700 Atherosclerosis of aorta: Secondary | ICD-10-CM

## 2018-10-31 DIAGNOSIS — N189 Chronic kidney disease, unspecified: Secondary | ICD-10-CM

## 2018-10-31 DIAGNOSIS — E119 Type 2 diabetes mellitus without complications: Secondary | ICD-10-CM

## 2018-10-31 DIAGNOSIS — E785 Hyperlipidemia, unspecified: Secondary | ICD-10-CM

## 2018-10-31 LAB — CBC WITH DIFFERENTIAL (CANCER CENTER ONLY)
Abs Immature Granulocytes: 0.02 10*3/uL (ref 0.00–0.07)
Basophils Absolute: 0.1 10*3/uL (ref 0.0–0.1)
Basophils Relative: 1 %
Eosinophils Absolute: 0.2 10*3/uL (ref 0.0–0.5)
Eosinophils Relative: 6 %
HCT: 33.1 % — ABNORMAL LOW (ref 39.0–52.0)
Hemoglobin: 10.4 g/dL — ABNORMAL LOW (ref 13.0–17.0)
Immature Granulocytes: 1 %
Lymphocytes Relative: 11 %
Lymphs Abs: 0.4 10*3/uL — ABNORMAL LOW (ref 0.7–4.0)
MCH: 28 pg (ref 26.0–34.0)
MCHC: 31.4 g/dL (ref 30.0–36.0)
MCV: 89 fL (ref 80.0–100.0)
Monocytes Absolute: 0.3 10*3/uL (ref 0.1–1.0)
Monocytes Relative: 10 %
Neutro Abs: 2.5 10*3/uL (ref 1.7–7.7)
Neutrophils Relative %: 71 %
Platelet Count: 177 10*3/uL (ref 150–400)
RBC: 3.72 MIL/uL — ABNORMAL LOW (ref 4.22–5.81)
RDW: 17.3 % — ABNORMAL HIGH (ref 11.5–15.5)
WBC Count: 3.5 10*3/uL — ABNORMAL LOW (ref 4.0–10.5)
nRBC: 0 % (ref 0.0–0.2)

## 2018-10-31 LAB — CMP (CANCER CENTER ONLY)
ALT: 9 U/L (ref 0–44)
AST: 16 U/L (ref 15–41)
Albumin: 3.4 g/dL — ABNORMAL LOW (ref 3.5–5.0)
Alkaline Phosphatase: 35 U/L — ABNORMAL LOW (ref 38–126)
Anion gap: 11 (ref 5–15)
BUN: 30 mg/dL — ABNORMAL HIGH (ref 8–23)
CO2: 26 mmol/L (ref 22–32)
Calcium: 9 mg/dL (ref 8.9–10.3)
Chloride: 102 mmol/L (ref 98–111)
Creatinine: 1.5 mg/dL — ABNORMAL HIGH (ref 0.61–1.24)
GFR, Est AFR Am: 54 mL/min — ABNORMAL LOW (ref >60)
GFR, Estimated: 46 mL/min — ABNORMAL LOW (ref >60)
Glucose, Bld: 146 mg/dL — ABNORMAL HIGH (ref 70–99)
Potassium: 3.5 mmol/L (ref 3.5–5.1)
Sodium: 139 mmol/L (ref 135–145)
Total Bilirubin: 0.5 mg/dL (ref 0.3–1.2)
Total Protein: 6.7 g/dL (ref 6.5–8.1)

## 2018-10-31 MED ORDER — HEPARIN SOD (PORK) LOCK FLUSH 100 UNIT/ML IV SOLN
500.0000 [IU] | Freq: Once | INTRAVENOUS | Status: AC | PRN
  Administered 2018-10-31: 500 [IU]
  Filled 2018-10-31: qty 5

## 2018-10-31 MED ORDER — SODIUM CHLORIDE 0.9 % IV SOLN
Freq: Once | INTRAVENOUS | Status: AC
  Administered 2018-10-31: 13:00:00 via INTRAVENOUS
  Filled 2018-10-31: qty 250

## 2018-10-31 MED ORDER — SODIUM CHLORIDE 0.9% FLUSH
10.0000 mL | INTRAVENOUS | Status: DC | PRN
  Administered 2018-10-31: 10 mL
  Filled 2018-10-31: qty 10

## 2018-10-31 MED ORDER — SODIUM CHLORIDE 0.9 % IV SOLN
10.0000 mg/kg | Freq: Once | INTRAVENOUS | Status: AC
  Administered 2018-10-31: 1120 mg via INTRAVENOUS
  Filled 2018-10-31: qty 20

## 2018-10-31 NOTE — Patient Instructions (Signed)
[facility] Discharge Instructions for Patients Receiving Chemotherapy  Today you received the following chemotherapy agents: Imfinzi.  To help prevent nausea and vomiting after your treatment, we encourage you to take your nausea medication as directed.   If you develop nausea and vomiting that is not controlled by your nausea medication, call the clinic.   BELOW ARE SYMPTOMS THAT SHOULD BE REPORTED IMMEDIATELY:  *FEVER GREATER THAN 100.5 F  *CHILLS WITH OR WITHOUT FEVER  NAUSEA AND VOMITING THAT IS NOT CONTROLLED WITH YOUR NAUSEA MEDICATION  *UNUSUAL SHORTNESS OF BREATH  *UNUSUAL BRUISING OR BLEEDING  TENDERNESS IN MOUTH AND THROAT WITH OR WITHOUT PRESENCE OF ULCERS  *URINARY PROBLEMS  *BOWEL PROBLEMS  UNUSUAL RASH Items with * indicate a potential emergency and should be followed up as soon as possible.  Feel free to call the clinic should you have any questions or concerns. The clinic phone number is [phone].  Please show the CHEMO ALERT CARD at check-in to the Emergency Department and triage nurse.   

## 2018-10-31 NOTE — Progress Notes (Signed)
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 First dose 02/27/2018.Status post 5 cycles.  CURRENT THERAPY:Consolidationimmunotherapy with Imfinzi 10 mg/kg every 2 weeks.First dose given on 05/17/2018.Status post 9 cycles.  INTERVAL HISTORY: Darrell [name] 73 y.o. male returns to the clinic today for follow-up visit and his wife was available by phone.  The patient is feeling fine today with no concerning complaints except for the shortness of breath with exertion.  He recently underwent repeat ultrasound-guided thoracentesis with drainage of pleural fluid.  Is feeling much better after the thoracentesis.  He denied having any current chest pain, cough or hemoptysis.  He denied having any recent weight loss or night sweats.  He has no nausea diarrhea or constipation.  He continues to tolerate his treatment with Imfinzi fairly well.  He is here today for evaluation before starting cycle #3.   MEDICAL HISTORY: Past Medical History:  Diagnosis Date   Aortic atherosclerosis ([place]) 07/18/2018   Cancer ([place])    Cellulitis of right lower extremity    Chronic kidney disease    Diabetes mellitus without complication ([place])    Dyslipidemia    Hypertension    Thoracic aortic aneurysm without rupture ([place]) 07/18/2018    ALLERGIES:  is allergic to lisinopril.  MEDICATIONS:  Current Outpatient Medications  Medication Sig Dispense Refill   albuterol (PROVENTIL HFA;VENTOLIN HFA) 108 (90 Base) MCG/ACT inhaler Inhale 2 puffs into the lungs every 4 (four) hours as  needed for wheezing or shortness of breath. 1 Inhaler 5   ASTAXANTHIN PO Take 1 capsule by mouth daily.      carvedilol (COREG) 6.25 MG tablet Take 6.25 mg by mouth 2 (two) times daily with a meal.      Cinnamon Bark POWD Take 1,000 mg by mouth 2 (two) times daily.      cloNIDine (CATAPRES) 0.1 MG tablet Take 0.1 mg by mouth 2 (two) times daily.      escitalopram (LEXAPRO) 10 MG tablet Take 10 mg by mouth daily.      fenofibrate 160 MG tablet Take 160 mg by mouth daily.      Glucosamine Sulfate 1000 MG TABS Take 2,000 mg by mouth daily.      glucose blood (PRECISION QID TEST) test strip by Misc.(Non-Drug; Combo Route) route.     hydrochlorothiazide (HYDRODIURIL) 25 MG tablet Take 25 mg by mouth daily.      Insulin Detemir (LEVEMIR FLEXTOUCH) 100 UNIT/ML Pen Inject 10 Units into the skin daily.      Insulin Pen Needle (FIFTY50 PEN NEEDLES) 31G X 8 MM MISC USE AS DIRECTED     Lancets MISC by Misc.(Non-Drug; Combo Route) route.     lidocaine-prilocaine (EMLA) cream Apply 1 application topically as needed. (Patient taking differently: Apply 1 application topically as needed (port). ) 30 g 0   losartan (COZAAR) 50 MG tablet Take 50 mg by mouth daily.      magnesium oxide (MAG-OX) 400 MG tablet Take 400 mg by mouth daily.     metFORMIN (GLUCOPHAGE) 1000 MG tablet Take 1,000 mg by mouth 2 (two) times daily with a meal.  Multiple Vitamin (MULTI-VITAMINS) TABS Take 1 tablet by mouth daily.      Omega-3 1000 MG CAPS Take 1,200 mg by mouth 2 (two) times daily.      pioglitazone (ACTOS) 45 MG tablet Take 45 mg by mouth daily.      potassium chloride SA (K-DUR,KLOR-CON) 20 MEQ tablet Take 1 tablet (20 mEq total) by mouth daily. 7 tablet 0   pravastatin (PRAVACHOL) 40 MG tablet Take 40 mg by mouth daily.      prochlorperazine (COMPAZINE) 10 MG tablet TAKE 1 TABLET BY MOUTH EVERY 6 HOURS AS NEEDED FOR NAUSEA OR VOMITING (Patient taking differently: Take 10 mg by mouth every 6 (six)  hours as needed for nausea or vomiting. ) 385 tablet 0   traMADol (ULTRAM) 50 MG tablet Take 1 tablet (50 mg total) by mouth every 6 (six) hours as needed for severe pain. 20 tablet 0   No current facility-administered medications for this visit.    Facility-Administered Medications Ordered in Other Visits  Medication Dose Route Frequency Provider Last Rate Last Dose   sodium chloride flush (NS) 0.9 % injection 10 mL  10 mL Intracatheter PRN Curt Bears, MD   10 mL at 06/28/18 1501   sodium chloride flush (NS) 0.9 % injection 10 mL  10 mL Intracatheter PRN Curt Bears, MD   10 mL at 10/31/18 1204    SURGICAL HISTORY:  Past Surgical History:  Procedure Laterality Date   IR THORACENTESIS ASP PLEURAL SPACE W/IMG GUIDE  09/05/2018   IR THORACENTESIS ASP PLEURAL SPACE W/IMG GUIDE  10/11/2018   IR THORACENTESIS ASP PLEURAL SPACE W/IMG GUIDE  10/30/2018   PORTACATH PLACEMENT Left 02/21/2018   Procedure: INSERTION PORT-A-CATH;  Surgeon: Grace Isaac, MD;  Location: [facility];  Service: Thoracic;  Laterality: Left;    REVIEW OF SYSTEMS:  A comprehensive review of systems was negative except for: Respiratory: positive for dyspnea on exertion   PHYSICAL EXAMINATION: General appearance: alert, cooperative and no distress Head: Normocephalic, without obvious abnormality, atraumatic Neck: no adenopathy, no JVD, supple, symmetrical, trachea midline and thyroid not enlarged, symmetric, no tenderness/mass/nodules Lymph nodes: Cervical, supraclavicular, and axillary nodes normal. Resp: clear to auscultation bilaterally Back: symmetric, no curvature. ROM normal. No CVA tenderness. Cardio: regular rate and rhythm, S1, S2 normal, no murmur, click, rub or gallop GI: soft, non-tender; bowel sounds normal; no masses,  no organomegaly Extremities: extremities normal, atraumatic, no cyanosis or edema  ECOG PERFORMANCE STATUS: 1 - Symptomatic but completely ambulatory  Blood pressure (!)  143/87, pulse 79, temperature 98.2 F (36.8 C), temperature source Oral, resp. rate 20, height 5\' 9"  (1.753 m), weight 256 lb 3.2 oz (116.2 kg), SpO2 100 %.  LABORATORY DATA: Lab Results  Component Value Date   WBC 2.7 (L) 10/16/2018   HGB 10.3 (L) 10/16/2018   HCT 32.5 (L) 10/16/2018   MCV 89.5 10/16/2018   PLT 176 10/16/2018      Chemistry      Component Value Date/Time   NA 139 10/16/2018 1010   K 3.3 (L) 10/16/2018 1010   CL 102 10/16/2018 1010   CO2 28 10/16/2018 1010   BUN 26 (H) 10/16/2018 1010   CREATININE 1.40 (H) 10/16/2018 1010      Component Value Date/Time   CALCIUM 9.3 10/16/2018 1010   ALKPHOS 37 (L) 10/16/2018 1010   AST 14 (L) 10/16/2018 1010   ALT 9 10/16/2018 1010   BILITOT 0.6 10/16/2018 1010       RADIOGRAPHIC STUDIES:  Dg Chest 1 View  Result Date: 10/30/2018 CLINICAL DATA:  Status post right thoracentesis today. EXAM: CHEST  1 VIEW COMPARISON:  Single-view of the chest 10/11/2018. FINDINGS: Right pleural effusion is decreased after thoracentesis. No pneumothorax. Mild basilar atelectasis on the left noted. Heart size is upper normal. Port-A-Cath is in place. IMPRESSION: Decreased right pleural effusion after thoracentesis. Negative for pneumothorax. Electronically Signed   By: Inge Rise M.D.   On: 10/30/2018 09:06   Dg Chest 1 View  Result Date: 10/11/2018 CLINICAL DATA:  Post right thoracentesis EXAM: CHEST  1 VIEW COMPARISON:  09/18/2018 FINDINGS: Moderate right pleural effusion noted. No pneumothorax following thoracentesis. Right lower lobe atelectasis or infiltrate. Mild cardiomegaly. No confluent opacity on the left. Left Port-A-Cath is unchanged. IMPRESSION: Moderate right pleural effusion without pneumothorax. Right lower lobe atelectasis or infiltrate. Electronically Signed   By: Rolm Baptise M.D.   On: 10/11/2018 09:47   Nm Pet Image Restag (ps) Skull Base To Thigh  Result Date: 10/13/2018 CLINICAL DATA:  Subsequent treatment strategy  for right upper lobe lung cancer. Status post chemotherapy and immunotherapy. EXAM: NUCLEAR MEDICINE PET SKULL BASE TO THIGH TECHNIQUE: 12.8 mCi F-18 FDG was injected intravenously. Full-ring PET imaging was performed from the skull base to thigh after the radiotracer. CT data was obtained and used for attenuation correction and anatomic localization. Fasting blood glucose: 110 mg/dl COMPARISON:  09/18/2018 chest radiograph. CT 08/28/2018. No prior PET. FINDINGS: Mediastinal blood pool activity: SUV max 3.0 NECK: No areas of abnormal hypermetabolism. Incidental CT findings: No cervical adenopathy. Bilateral carotid atherosclerosis. CHEST: Right perihilar geographic consolidation with low-level hypermetabolism, likely radiation induced. This is grossly similar in CT appearance to the 08/28/2018 exam. Measures a S.U.V. max of 4.4. No localized hypermetabolism to suggest locally recurrent or residual disease. No thoracic nodal hypermetabolism. Incidental CT findings: Moderate right pleural effusion, similar. Tiny left pleural effusion has resolved. Left Port-A-Cath tip at high right atrium. Mild cardiomegaly. Coronary and aortic atherosclerosis. Pulmonary artery enlargement again suggest pulmonary arterial hypertension. Favor atelectasis along the left major fissure on image 38/8. ABDOMEN/PELVIS: No abdominopelvic parenchymal or nodal hypermetabolism. Incidental CT findings: 8 mm left adrenal myelolipoma. Cholecystectomy. 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 Prostatectomy and pelvic node dissection. SKELETON: Hypermetabolism about the greater trochanters of the femurs is likely due to trochanteric bursitis. No abnormal marrow activity. Incidental CT findings: Bilateral L5 pars defects. Grade 1 L5-S1 anterolisthesis. Thoracic spondylosis. IMPRESSION: 1. Right-sided radiation changes, without findings of recurrent or metastatic disease. 2. Similar moderate right pleural effusion. 3. Coronary artery atherosclerosis.  Aortic Atherosclerosis (ICD10-I70.0). 4. Pulmonary artery enlargement suggests pulmonary arterial hypertension. Electronically Signed   By: Abigail Miyamoto M.D.   On: 10/13/2018 11:09   Ir Thoracentesis Asp Pleural Space W/img Guide  Result Date: 10/30/2018 INDICATION: Patient with history of small cell carcinoma of the right lung, dyspnea, and recurrent right pleural effusion. Request is made for therapeutic right thoracentesis. EXAM: ULTRASOUND GUIDED THERAPEUTIC RIGHT THORACENTESIS MEDICATIONS: 10 mL of 1% lidocaine COMPLICATIONS: None immediate. PROCEDURE: An ultrasound guided thoracentesis was thoroughly discussed with the patient and questions answered. The benefits, risks, alternatives and complications were also discussed. The patient understands and wishes to proceed with the procedure. Written consent was obtained. Ultrasound was performed to localize and mark an adequate pocket of fluid in the right chest. The area was then prepped and draped in the normal sterile fashion. 1% Lidocaine was used for local anesthesia. Under ultrasound guidance a 6 Fr Safe-T-Centesis catheter was introduced. Thoracentesis  was performed. Procedure was stopped after 2.1 L due to patient coughing. The catheter was removed and a dressing applied. FINDINGS: A total of approximately 2.1 L of clear gold fluid was removed. Procedure was stopped after 2.1 L due to patient coughing. IMPRESSION: Successful ultrasound guided right thoracentesis yielding 2.1 L of pleural fluid. Read by: Earley Abide, PA-C Electronically Signed   By: Aletta Edouard M.D.   On: 10/30/2018 09:12   Ir Thoracentesis Asp Pleural Space W/img Guide  Result Date: 10/11/2018 INDICATION: Patient with history of right pleural effusion. Request is made for diagnostic and therapeutic thoracentesis. EXAM: ULTRASOUND GUIDED RIGHT DIAGNOSTIC AND THERAPEUTIC THORACENTESIS MEDICATIONS: 10 mL 1% lidocaine COMPLICATIONS: None immediate. PROCEDURE: An ultrasound guided  thoracentesis was thoroughly discussed with the patient and questions answered. The benefits, risks, alternatives and complications were also discussed. The patient understands and wishes to proceed with the procedure. Written consent was obtained. Ultrasound was performed to localize and mark an adequate pocket of fluid in the right chest. The area was then prepped and draped in the normal sterile fashion. 1% Lidocaine was used for local anesthesia. Under ultrasound guidance a 6 Fr Safe-T-Centesis catheter was introduced. Thoracentesis was performed. The catheter was removed and a dressing applied. FINDINGS: A total of approximately 1.7 liters of yellow fluid was removed. Samples were sent to the laboratory as requested by the clinical team. IMPRESSION: Successful ultrasound guided diagnostic and therapeutic right thoracentesis yielding 1.7 liters of pleural fluid. Read by: Brynda Greathouse PA-C Electronically Signed   By: Sandi Mariscal M.D.   On: 10/11/2018 12:07    ASSESSMENT AND PLAN: This is a very pleasant 73 years old white male with a stage IIb/IIIa non-small cell lung cancer, squamous cell carcinoma diagnosed in July 2019. The patient is currently undergoing a course of concurrent chemoradiation with weekly carboplatin and paclitaxel status post 5 cycles.  He had partial response after the initial induction treatment. The patient was started on treatment with consolidation Imfinzi status post 9 cycles.  He continues to tolerate this treatment well with no concerning adverse effects. I recommended for the patient to proceed with cycle #10 today as scheduled. For the recurrent right pleural effusion, we will consider the addition for Pleurx catheter placement if he has any reaccumulation in the future. He will come back for follow-up visit in 2 weeks for evaluation before starting cycle #11. The patient was advised to call immediately if he has any concerning symptoms in the interim. The patient  voices understanding of current disease status and treatment options and is in agreement with the current care plan. All questions were answered. The patient knows to call the clinic with any problems, questions or concerns. We can certainly see the patient much sooner if necessary.  Disclaimer: This note was dictated with voice recognition software. Similar sounding words can inadvertently be transcribed and may not be corrected upon review.

## 2018-11-13 ENCOUNTER — Inpatient Hospital Stay: Payer: Medicare Other

## 2018-11-13 ENCOUNTER — Other Ambulatory Visit: Payer: Self-pay

## 2018-11-13 ENCOUNTER — Encounter: Payer: Self-pay | Admitting: Physician Assistant

## 2018-11-13 ENCOUNTER — Inpatient Hospital Stay (HOSPITAL_BASED_OUTPATIENT_CLINIC_OR_DEPARTMENT_OTHER): Payer: Medicare Other | Admitting: Physician Assistant

## 2018-11-13 ENCOUNTER — Inpatient Hospital Stay: Payer: Medicare Other | Attending: Internal Medicine

## 2018-11-13 VITALS — BP 137/74 | HR 59 | Temp 97.3°F | Resp 18 | Ht 69.0 in | Wt 260.0 lb

## 2018-11-13 DIAGNOSIS — Z5112 Encounter for antineoplastic immunotherapy: Secondary | ICD-10-CM

## 2018-11-13 DIAGNOSIS — J90 Pleural effusion, not elsewhere classified: Secondary | ICD-10-CM | POA: Insufficient documentation

## 2018-11-13 DIAGNOSIS — E119 Type 2 diabetes mellitus without complications: Secondary | ICD-10-CM | POA: Diagnosis not present

## 2018-11-13 DIAGNOSIS — Z79899 Other long term (current) drug therapy: Secondary | ICD-10-CM | POA: Diagnosis not present

## 2018-11-13 DIAGNOSIS — R5383 Other fatigue: Secondary | ICD-10-CM | POA: Diagnosis not present

## 2018-11-13 DIAGNOSIS — N189 Chronic kidney disease, unspecified: Secondary | ICD-10-CM | POA: Insufficient documentation

## 2018-11-13 DIAGNOSIS — E785 Hyperlipidemia, unspecified: Secondary | ICD-10-CM | POA: Insufficient documentation

## 2018-11-13 DIAGNOSIS — I129 Hypertensive chronic kidney disease with stage 1 through stage 4 chronic kidney disease, or unspecified chronic kidney disease: Secondary | ICD-10-CM | POA: Insufficient documentation

## 2018-11-13 DIAGNOSIS — Z794 Long term (current) use of insulin: Secondary | ICD-10-CM

## 2018-11-13 DIAGNOSIS — I700 Atherosclerosis of aorta: Secondary | ICD-10-CM | POA: Diagnosis not present

## 2018-11-13 DIAGNOSIS — R5382 Chronic fatigue, unspecified: Secondary | ICD-10-CM

## 2018-11-13 DIAGNOSIS — I712 Thoracic aortic aneurysm, without rupture: Secondary | ICD-10-CM | POA: Insufficient documentation

## 2018-11-13 DIAGNOSIS — C3411 Malignant neoplasm of upper lobe, right bronchus or lung: Secondary | ICD-10-CM | POA: Diagnosis not present

## 2018-11-13 DIAGNOSIS — Z95828 Presence of other vascular implants and grafts: Secondary | ICD-10-CM

## 2018-11-13 LAB — TSH: TSH: 2.919 u[IU]/mL (ref 0.320–4.118)

## 2018-11-13 LAB — CBC WITH DIFFERENTIAL (CANCER CENTER ONLY)
Abs Immature Granulocytes: 0.01 10*3/uL (ref 0.00–0.07)
Basophils Absolute: 0 10*3/uL (ref 0.0–0.1)
Basophils Relative: 1 %
Eosinophils Absolute: 0.2 10*3/uL (ref 0.0–0.5)
Eosinophils Relative: 6 %
HCT: 32 % — ABNORMAL LOW (ref 39.0–52.0)
Hemoglobin: 10.2 g/dL — ABNORMAL LOW (ref 13.0–17.0)
Immature Granulocytes: 0 %
Lymphocytes Relative: 12 %
Lymphs Abs: 0.4 10*3/uL — ABNORMAL LOW (ref 0.7–4.0)
MCH: 27.9 pg (ref 26.0–34.0)
MCHC: 31.9 g/dL (ref 30.0–36.0)
MCV: 87.4 fL (ref 80.0–100.0)
Monocytes Absolute: 0.3 10*3/uL (ref 0.1–1.0)
Monocytes Relative: 11 %
Neutro Abs: 2 10*3/uL (ref 1.7–7.7)
Neutrophils Relative %: 70 %
Platelet Count: 179 10*3/uL (ref 150–400)
RBC: 3.66 MIL/uL — ABNORMAL LOW (ref 4.22–5.81)
RDW: 17.7 % — ABNORMAL HIGH (ref 11.5–15.5)
WBC Count: 2.9 10*3/uL — ABNORMAL LOW (ref 4.0–10.5)
nRBC: 0 % (ref 0.0–0.2)

## 2018-11-13 LAB — CMP (CANCER CENTER ONLY)
ALT: 10 U/L (ref 0–44)
AST: 17 U/L (ref 15–41)
Albumin: 3.4 g/dL — ABNORMAL LOW (ref 3.5–5.0)
Alkaline Phosphatase: 35 U/L — ABNORMAL LOW (ref 38–126)
Anion gap: 9 (ref 5–15)
BUN: 36 mg/dL — ABNORMAL HIGH (ref 8–23)
CO2: 28 mmol/L (ref 22–32)
Calcium: 9.4 mg/dL (ref 8.9–10.3)
Chloride: 101 mmol/L (ref 98–111)
Creatinine: 1.74 mg/dL — ABNORMAL HIGH (ref 0.61–1.24)
GFR, Est AFR Am: 45 mL/min — ABNORMAL LOW (ref >60)
GFR, Estimated: 39 mL/min — ABNORMAL LOW (ref >60)
Glucose, Bld: 147 mg/dL — ABNORMAL HIGH (ref 70–99)
Potassium: 3.5 mmol/L (ref 3.5–5.1)
Sodium: 138 mmol/L (ref 135–145)
Total Bilirubin: 0.5 mg/dL (ref 0.3–1.2)
Total Protein: 7.1 g/dL (ref 6.5–8.1)

## 2018-11-13 MED ORDER — SODIUM CHLORIDE 0.9% FLUSH
10.0000 mL | INTRAVENOUS | Status: DC | PRN
  Administered 2018-11-13: 10 mL
  Filled 2018-11-13: qty 10

## 2018-11-13 MED ORDER — HEPARIN SOD (PORK) LOCK FLUSH 100 UNIT/ML IV SOLN
500.0000 [IU] | Freq: Once | INTRAVENOUS | Status: AC | PRN
  Administered 2018-11-13: 500 [IU]
  Filled 2018-11-13: qty 5

## 2018-11-13 MED ORDER — SODIUM CHLORIDE 0.9 % IV SOLN
Freq: Once | INTRAVENOUS | Status: AC
  Administered 2018-11-13: 13:00:00 via INTRAVENOUS
  Filled 2018-11-13: qty 250

## 2018-11-13 MED ORDER — SODIUM CHLORIDE 0.9 % IV SOLN
10.0000 mg/kg | Freq: Once | INTRAVENOUS | Status: AC
  Administered 2018-11-13: 1120 mg via INTRAVENOUS
  Filled 2018-11-13: qty 20

## 2018-11-13 NOTE — Progress Notes (Signed)
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 First dose 02/27/2018.Status post 5 cycles.  CURRENT THERAPY: Consolidationimmunotherapy with Imfinzi 10 mg/kg every 2 weeks.First dose given on 05/17/2018.Status post10cycles.  INTERVAL HISTORY: Darrell [name] 73 y.o. male returns to the clinic for a follow-up visit.  The patient recently had several episodes of a large right pleural effusion which required four thoracenteses. Cytology was negative for malignant cells.  His most recent thoracentesis was on October 30, 2018.  The patient states that his breathing is improved initially; however, he starts to feel progressively more short of breath 3-4 days following the procedure.  Besides feeling short of breath, the patient is feeling well today and has been tolerating his treatment fairly well without any adverse effects except for mild fatigue.  He denies any fever, chills, night sweats or weight loss.  He denies any chest pain, cough, or hemoptysis.  He denies any nausea, vomiting, diarrhea, or constipation.  He denies any headaches or visual changes.  He denies any rashes or skin changes.  He is here today for evaluation prior to starting cycle #11.  MEDICAL HISTORY: Past Medical History:  Diagnosis Date  . Aortic atherosclerosis ([place]) 07/18/2018  . Cancer ([place])   . Cellulitis of right lower extremity   . Chronic kidney disease   . Diabetes mellitus without complication ([place])   . Dyslipidemia   . Hypertension   . Thoracic aortic aneurysm without rupture ([place]) 07/18/2018    ALLERGIES:  is allergic to  lisinopril.  MEDICATIONS:  Current Outpatient Medications  Medication Sig Dispense Refill  . albuterol (PROVENTIL HFA;VENTOLIN HFA) 108 (90 Base) MCG/ACT inhaler Inhale 2 puffs into the lungs every 4 (four) hours as needed for wheezing or shortness of breath. 1 Inhaler 5  . ASTAXANTHIN PO Take 1 capsule by mouth daily.     . carvedilol (COREG) 6.25 MG tablet Take 6.25 mg by mouth 2 (two) times daily with a meal.     . Cinnamon Bark POWD Take 1,000 mg by mouth 2 (two) times daily.     . cloNIDine (CATAPRES) 0.1 MG tablet Take 0.1 mg by mouth 2 (two) times daily.     Marland Kitchen escitalopram (LEXAPRO) 10 MG tablet Take 10 mg by mouth daily.     . fenofibrate 160 MG tablet Take 160 mg by mouth daily.     . Glucosamine Sulfate 1000 MG TABS Take 2,000 mg by mouth daily.     Marland Kitchen glucose blood (PRECISION QID TEST) test strip by Misc.(Non-Drug; Combo Route) route.    . hydrochlorothiazide (HYDRODIURIL) 25 MG tablet Take 25 mg by mouth daily.     . Insulin Detemir (LEVEMIR FLEXTOUCH) 100 UNIT/ML Pen Inject 10 Units into the skin daily.     . Insulin Pen Needle (FIFTY50 PEN NEEDLES) 31G X 8 MM MISC USE AS DIRECTED    . Lancets MISC by Misc.(Non-Drug; Combo Route) route.    . lidocaine-prilocaine (EMLA) cream Apply 1 application topically as needed. (Patient taking differently: Apply 1 application topically as needed (port). ) 30 g 0  . losartan (COZAAR) 50 MG tablet Take 50 mg by mouth daily.     . magnesium oxide (MAG-OX)  400 MG tablet Take 400 mg by mouth daily.    . metFORMIN (GLUCOPHAGE) 1000 MG tablet Take 1,000 mg by mouth 2 (two) times daily with a meal.     . Multiple Vitamin (MULTI-VITAMINS) TABS Take 1 tablet by mouth daily.     . Omega-3 1000 MG CAPS Take 1,200 mg by mouth 2 (two) times daily.     . pioglitazone (ACTOS) 45 MG tablet Take 45 mg by mouth daily.     . pravastatin (PRAVACHOL) 40 MG tablet Take 40 mg by mouth daily.     . traMADol (ULTRAM) 50 MG tablet Take 1 tablet (50 mg total) by mouth  every 6 (six) hours as needed for severe pain. 20 tablet 0  . potassium chloride SA (K-DUR,KLOR-CON) 20 MEQ tablet Take 1 tablet (20 mEq total) by mouth daily. (Patient not taking: Reported on 11/13/2018) 7 tablet 0  . prochlorperazine (COMPAZINE) 10 MG tablet TAKE 1 TABLET BY MOUTH EVERY 6 HOURS AS NEEDED FOR NAUSEA OR VOMITING (Patient not taking: No sig reported) 385 tablet 0   No current facility-administered medications for this visit.    Facility-Administered Medications Ordered in Other Visits  Medication Dose Route Frequency Provider Last Rate Last Dose  . durvalumab (IMFINZI) 1,120 mg in sodium chloride 0.9 % 100 mL chemo infusion  10 mg/kg (Treatment Plan Recorded) Intravenous Once Curt Bears, MD      . heparin lock flush 100 unit/mL  500 Units Intracatheter Once PRN Curt Bears, MD      . sodium chloride flush (NS) 0.9 % injection 10 mL  10 mL Intracatheter PRN Curt Bears, MD   10 mL at 06/28/18 1501  . sodium chloride flush (NS) 0.9 % injection 10 mL  10 mL Intracatheter PRN Curt Bears, MD        SURGICAL HISTORY:  Past Surgical History:  Procedure Laterality Date  . IR THORACENTESIS ASP PLEURAL SPACE W/IMG GUIDE  09/05/2018  . IR THORACENTESIS ASP PLEURAL SPACE W/IMG GUIDE  10/11/2018  . IR THORACENTESIS ASP PLEURAL SPACE W/IMG GUIDE  10/30/2018  . PORTACATH PLACEMENT Left 02/21/2018   Procedure: INSERTION PORT-A-CATH;  Surgeon: Grace Isaac, MD;  Location: [facility] OR;  Service: Thoracic;  Laterality: Left;    REVIEW OF SYSTEMS:   Review of Systems  Constitutional: Positive for fatigue. Negative for appetite change, chills, fever and unexpected weight change.  HENT:   Negative for mouth sores, nosebleeds, sore throat and trouble swallowing.   Eyes: Negative for eye problems and icterus.  Respiratory: Positive for shortness of breath with exertion. Negative for cough, hemoptysis, and wheezing.   Cardiovascular: Negative for chest pain and leg swelling.   Gastrointestinal: Negative for abdominal pain, constipation, diarrhea, nausea and vomiting.  Genitourinary: Negative for bladder incontinence, difficulty urinating, dysuria, frequency and hematuria.   Musculoskeletal: Negative for back pain, gait problem, neck pain and neck stiffness.  Skin: Negative for itching and rash.  Neurological: Negative for dizziness, extremity weakness, gait problem, headaches, light-headedness and seizures.  Hematological: Negative for adenopathy. Does not bruise/bleed easily.  Psychiatric/Behavioral: Negative for confusion, depression and sleep disturbance. The patient is not nervous/anxious.     PHYSICAL EXAMINATION:  Blood pressure 137/74, pulse (!) 59, temperature (!) 97.3 F (36.3 C), temperature source Oral, resp. rate 18, height 5\' 9"  (1.753 m), weight 260 lb (117.9 kg), SpO2 99 %.  ECOG PERFORMANCE STATUS: 1 - Symptomatic but completely ambulatory  Physical Exam  Constitutional: Oriented to person, place, and time and well-developed, well-nourished,  and in no distress.  HENT:  Head: Normocephalic and atraumatic.  Mouth/Throat: Oropharynx is clear and moist. No oropharyngeal exudate.  Eyes: Conjunctivae are normal. Right eye exhibits no discharge. Left eye exhibits no discharge. No scleral icterus.  Neck: Normal range of motion. Neck supple.  Cardiovascular: Normal rate, regular rhythm, normal heart sounds and intact distal pulses.   Pulmonary/Chest: Dullness to percussion on the right middle/lower lobe. Effort normal and breath sounds normal. No respiratory distress. No wheezes. No rales.  Abdominal: Soft. Bowel sounds are normal. Exhibits no distension and no mass. There is no tenderness.  Musculoskeletal: Normal range of motion. Exhibits no edema.  Lymphadenopathy:    No cervical adenopathy.  Neurological: Alert and oriented to person, place, and time. Exhibits normal muscle tone. Gait normal. Coordination normal.  Skin: Skin is warm and dry. No  rash noted. Not diaphoretic. No erythema. No pallor.  Psychiatric: Mood, memory and judgment normal.  Vitals reviewed.  LABORATORY DATA: Lab Results  Component Value Date   WBC 2.9 (L) 11/13/2018   HGB 10.2 (L) 11/13/2018   HCT 32.0 (L) 11/13/2018   MCV 87.4 11/13/2018   PLT 179 11/13/2018      Chemistry      Component Value Date/Time   NA 138 11/13/2018 1100   K 3.5 11/13/2018 1100   CL 101 11/13/2018 1100   CO2 28 11/13/2018 1100   BUN 36 (H) 11/13/2018 1100   CREATININE 1.74 (H) 11/13/2018 1100      Component Value Date/Time   CALCIUM 9.4 11/13/2018 1100   ALKPHOS 35 (L) 11/13/2018 1100   AST 17 11/13/2018 1100   ALT 10 11/13/2018 1100   BILITOT 0.5 11/13/2018 1100       RADIOGRAPHIC STUDIES:  Dg Chest 1 View  Result Date: 10/30/2018 CLINICAL DATA:  Status post right thoracentesis today. EXAM: CHEST  1 VIEW COMPARISON:  Single-view of the chest 10/11/2018. FINDINGS: Right pleural effusion is decreased after thoracentesis. No pneumothorax. Mild basilar atelectasis on the left noted. Heart size is upper normal. Port-A-Cath is in place. IMPRESSION: Decreased right pleural effusion after thoracentesis. Negative for pneumothorax. Electronically Signed   By: Inge Rise M.D.   On: 10/30/2018 09:06   Ir Thoracentesis Asp Pleural Space W/img Guide  Result Date: 10/30/2018 INDICATION: Patient with history of small cell carcinoma of the right lung, dyspnea, and recurrent right pleural effusion. Request is made for therapeutic right thoracentesis. EXAM: ULTRASOUND GUIDED THERAPEUTIC RIGHT THORACENTESIS MEDICATIONS: 10 mL of 1% lidocaine COMPLICATIONS: None immediate. PROCEDURE: An ultrasound guided thoracentesis was thoroughly discussed with the patient and questions answered. The benefits, risks, alternatives and complications were also discussed. The patient understands and wishes to proceed with the procedure. Written consent was obtained. Ultrasound was performed to  localize and mark an adequate pocket of fluid in the right chest. The area was then prepped and draped in the normal sterile fashion. 1% Lidocaine was used for local anesthesia. Under ultrasound guidance a 6 Fr Safe-T-Centesis catheter was introduced. Thoracentesis was performed. Procedure was stopped after 2.1 L due to patient coughing. The catheter was removed and a dressing applied. FINDINGS: A total of approximately 2.1 L of clear gold fluid was removed. Procedure was stopped after 2.1 L due to patient coughing. IMPRESSION: Successful ultrasound guided right thoracentesis yielding 2.1 L of pleural fluid. Read by: Earley Abide, PA-C Electronically Signed   By: Aletta Edouard M.D.   On: 10/30/2018 09:12     ASSESSMENT/PLAN:  This is a very pleasant  73 year old Caucasian male with stage IIIb,IIIA non-small cell lung cancer, squamous cell carcinoma of the right lung.  He was diagnosed in July 2019.  The patient underwent concurrent chemoradiation with weekly carboplatin and paclitaxel.  He is status post 5 cycles with a partial response after the initial induction treatment.  The patient is currently undergoing consolidation immunotherapy with Imfinzi 10 mg/kg IV every 2 weeks. He is tolerating treatment well without any adverse effects.  He is currently status post 10 cycles of treatment.  The patient was seen with Dr. [name] today. Labs were reviewed with the patient. Dr. [name] recommends that the patient proceed with cycle #11 today as scheduled. We will see the patient back for a follow-up visit in 2 weeks for evaluation before starting cycle #12.  Since the patient's last thoracentesis on October 30, 2018, the patient has been feeling progressively more short of breath. Dr. [name] and the patient discussed a pleurx catheter placement at their last appointment. The patient is interested in a Pleurx catheter due to his recurrent pleural effusions.  I have sent a referral to interventional  radiology for consideration of a Pleurx catheter placement.  The patient was advised to call immediately if he has any concerning symptoms in the interval. The patient voices understanding of current disease status and treatment options and is in agreement with the current care plan. All questions were answered. The patient knows to call the clinic with any problems, questions or concerns. We can certainly see the patient much sooner if necessary    Orders Placed This Encounter  Procedures  . IR Guided Drain W Catheter Placement    Referral for pleurx catheter placement due to recurrent right side pleural effusions    Standing Status:   Future    Standing Expiration Date:   01/13/2020    Order Specific Question:   Reason for exam:    Answer:   Recurrent pleural effusions    Order Specific Question:   Preferred Imaging Location?    Answer:   [facility]     Kourtney Montesinos L Haeley Fordham, PA-C 11/13/18  ADDENDUM: Hematology/Oncology Attending: I had a face-to-face encounter with the patient today.  I recommended his care plan.  This is a very pleasant 73 years old white male with stage IIIa non-small cell lung cancer, squamous cell carcinoma status post induction concurrent chemoradiation with weekly carboplatin and paclitaxel with partial response.  The patient is currently undergoing consolidation treatment with immunotherapy with Imfinzi status post 10 cycles.  He has been tolerating this treatment well.  The patient continues to have shortness of breath secondary to recurrent right pleural effusion.  The previous cytology of the fluid was negative for malignancy. I recommended for the patient to continue his current treatment with Imfinzi and he will proceed with cycle #11 today. For the recurrent right pleural effusion, we will arrange for the patient to have the Pleurx catheter placed by interventional etiology. The patient will come back for follow-up visit in 2 weeks for evaluation  before the next cycle of his treatment. He was advised to call immediately if he has any concerning symptoms in the interval.  Disclaimer: This note was dictated with voice recognition software. Similar sounding words can inadvertently be transcribed and may be missed upon review. Eilleen Kempf, MD 11/13/18

## 2018-11-13 NOTE — Patient Instructions (Signed)
[facility] Discharge Instructions for Patients Receiving Chemotherapy  Today you received the following chemotherapy agents: Imfinzi.  To help prevent nausea and vomiting after your treatment, we encourage you to take your nausea medication as directed.   If you develop nausea and vomiting that is not controlled by your nausea medication, call the clinic.   BELOW ARE SYMPTOMS THAT SHOULD BE REPORTED IMMEDIATELY:  *FEVER GREATER THAN 100.5 F  *CHILLS WITH OR WITHOUT FEVER  NAUSEA AND VOMITING THAT IS NOT CONTROLLED WITH YOUR NAUSEA MEDICATION  *UNUSUAL SHORTNESS OF BREATH  *UNUSUAL BRUISING OR BLEEDING  TENDERNESS IN MOUTH AND THROAT WITH OR WITHOUT PRESENCE OF ULCERS  *URINARY PROBLEMS  *BOWEL PROBLEMS  UNUSUAL RASH Items with * indicate a potential emergency and should be followed up as soon as possible.  Feel free to call the clinic should you have any questions or concerns. The clinic phone number is [phone].  Please show the CHEMO ALERT CARD at check-in to the Emergency Department and triage nurse.   

## 2018-11-13 NOTE — Progress Notes (Signed)
Per Dr. [name], okay to recieve treatment today with creatine of 1.74.

## 2018-11-13 NOTE — Patient Instructions (Signed)

## 2018-11-14 ENCOUNTER — Telehealth: Payer: Self-pay | Admitting: Medical Oncology

## 2018-11-14 ENCOUNTER — Telehealth: Payer: Self-pay | Admitting: Physician Assistant

## 2018-11-14 ENCOUNTER — Other Ambulatory Visit (HOSPITAL_COMMUNITY): Payer: Self-pay | Admitting: Physician Assistant

## 2018-11-14 ENCOUNTER — Other Ambulatory Visit: Payer: Self-pay | Admitting: Student

## 2018-11-14 DIAGNOSIS — J90 Pleural effusion, not elsewhere classified: Secondary | ICD-10-CM

## 2018-11-14 NOTE — Telephone Encounter (Signed)
Scheduled appt per 5/4 los - added additional cycles. Pt to get an updated schedule next visit.

## 2018-11-14 NOTE — Telephone Encounter (Signed)
Breathing - He says he needs thoracentesis/pleruix placement tomorrow .message to Cassie.

## 2018-11-15 ENCOUNTER — Telehealth: Payer: Self-pay | Admitting: Medical Oncology

## 2018-11-15 ENCOUNTER — Ambulatory Visit (HOSPITAL_COMMUNITY)
Admission: RE | Admit: 2018-11-15 | Discharge: 2018-11-15 | Disposition: A | Discharge status: Home / Self Care | Payer: Medicare Other | Source: Ambulatory Visit | Attending: Physician Assistant | Admitting: Physician Assistant

## 2018-11-15 ENCOUNTER — Other Ambulatory Visit: Payer: Self-pay | Admitting: Physician Assistant

## 2018-11-15 ENCOUNTER — Encounter (HOSPITAL_COMMUNITY): Payer: Self-pay | Admitting: Interventional Radiology

## 2018-11-15 ENCOUNTER — Other Ambulatory Visit: Payer: Self-pay

## 2018-11-15 DIAGNOSIS — Z87891 Personal history of nicotine dependence: Secondary | ICD-10-CM | POA: Insufficient documentation

## 2018-11-15 DIAGNOSIS — Z888 Allergy status to other drugs, medicaments and biological substances status: Secondary | ICD-10-CM | POA: Insufficient documentation

## 2018-11-15 DIAGNOSIS — J90 Pleural effusion, not elsewhere classified: Secondary | ICD-10-CM | POA: Diagnosis not present

## 2018-11-15 DIAGNOSIS — I129 Hypertensive chronic kidney disease with stage 1 through stage 4 chronic kidney disease, or unspecified chronic kidney disease: Secondary | ICD-10-CM | POA: Insufficient documentation

## 2018-11-15 DIAGNOSIS — Z79899 Other long term (current) drug therapy: Secondary | ICD-10-CM | POA: Insufficient documentation

## 2018-11-15 DIAGNOSIS — Z8249 Family history of ischemic heart disease and other diseases of the circulatory system: Secondary | ICD-10-CM | POA: Insufficient documentation

## 2018-11-15 DIAGNOSIS — E1122 Type 2 diabetes mellitus with diabetic chronic kidney disease: Secondary | ICD-10-CM | POA: Insufficient documentation

## 2018-11-15 DIAGNOSIS — R0600 Dyspnea, unspecified: Secondary | ICD-10-CM | POA: Insufficient documentation

## 2018-11-15 DIAGNOSIS — E785 Hyperlipidemia, unspecified: Secondary | ICD-10-CM | POA: Insufficient documentation

## 2018-11-15 DIAGNOSIS — Z794 Long term (current) use of insulin: Secondary | ICD-10-CM | POA: Diagnosis not present

## 2018-11-15 DIAGNOSIS — N189 Chronic kidney disease, unspecified: Secondary | ICD-10-CM | POA: Insufficient documentation

## 2018-11-15 DIAGNOSIS — C3491 Malignant neoplasm of unspecified part of right bronchus or lung: Secondary | ICD-10-CM | POA: Insufficient documentation

## 2018-11-15 DIAGNOSIS — Z803 Family history of malignant neoplasm of breast: Secondary | ICD-10-CM | POA: Diagnosis not present

## 2018-11-15 DIAGNOSIS — I712 Thoracic aortic aneurysm, without rupture: Secondary | ICD-10-CM | POA: Insufficient documentation

## 2018-11-15 HISTORY — PX: IR GUIDED DRAIN W CATHETER PLACEMENT: IMG719

## 2018-11-15 LAB — PROTIME-INR
INR: 1.3 — ABNORMAL HIGH (ref 0.8–1.2)
Prothrombin Time: 15.9 seconds — ABNORMAL HIGH (ref 11.4–15.2)

## 2018-11-15 LAB — CBC
HCT: 30.9 % — ABNORMAL LOW (ref 39.0–52.0)
Hemoglobin: 10.2 g/dL — ABNORMAL LOW (ref 13.0–17.0)
MCH: 28.4 pg (ref 26.0–34.0)
MCHC: 33 g/dL (ref 30.0–36.0)
MCV: 86.1 fL (ref 80.0–100.0)
Platelets: 278 10*3/uL (ref 150–400)
RBC: 3.59 MIL/uL — ABNORMAL LOW (ref 4.22–5.81)
RDW: 18 % — ABNORMAL HIGH (ref 11.5–15.5)
WBC: 2.8 10*3/uL — ABNORMAL LOW (ref 4.0–10.5)
nRBC: 0 % (ref 0.0–0.2)

## 2018-11-15 LAB — GLUCOSE, CAPILLARY: Glucose-Capillary: 100 mg/dL — ABNORMAL HIGH (ref 70–99)

## 2018-11-15 MED ORDER — LIDOCAINE HCL (PF) 1 % IJ SOLN
INTRAMUSCULAR | Status: AC | PRN
  Administered 2018-11-15: 10 mL

## 2018-11-15 MED ORDER — CEFAZOLIN SODIUM-DEXTROSE 2-4 GM/100ML-% IV SOLN
INTRAVENOUS | Status: AC
  Filled 2018-11-15: qty 100

## 2018-11-15 MED ORDER — FENTANYL CITRATE (PF) 100 MCG/2ML IJ SOLN
INTRAMUSCULAR | Status: AC | PRN
  Administered 2018-11-15: 50 ug via INTRAVENOUS

## 2018-11-15 MED ORDER — SODIUM CHLORIDE 0.9 % IV SOLN: INTRAVENOUS | Status: DC

## 2018-11-15 MED ORDER — MIDAZOLAM HCL 2 MG/2ML IJ SOLN
INTRAMUSCULAR | Status: AC
  Filled 2018-11-15: qty 2

## 2018-11-15 MED ORDER — FENTANYL CITRATE (PF) 100 MCG/2ML IJ SOLN
INTRAMUSCULAR | Status: AC
  Filled 2018-11-15: qty 2

## 2018-11-15 MED ORDER — CEFAZOLIN SODIUM-DEXTROSE 2-4 GM/100ML-% IV SOLN
2.0000 g | Freq: Once | INTRAVENOUS | Status: AC
  Administered 2018-11-15: 2 g via INTRAVENOUS

## 2018-11-15 MED ORDER — MIDAZOLAM HCL 2 MG/2ML IJ SOLN
INTRAMUSCULAR | Status: AC | PRN
  Administered 2018-11-15: 1 mg via INTRAVENOUS

## 2018-11-15 NOTE — Discharge Instructions (Addendum)
Indwelling Pleural Catheter Home Guide  An indwelling pleural catheter is a thin, flexible tube that is inserted under your skin and into your chest. The catheter drains excess fluid that collects in the area between the chest wall and the lungs (pleural space).After the catheter is inserted, it can be attached to a bottle that collects fluid. The pleural catheter will allow you to drain fluid from your chest at home on a regular basis (sometimes daily). This will eliminate the need for frequent visits to the hospital or clinic to drain the fluid. The catheter may be removed after the excess fluid problem is resolved, usually after 2-3 months. It is important to follow instructions from your health care provider about how to drain and care for your catheter. What are the risks? Generally, this is a safe procedure. However, problems may occur, including:  Infection.  Skin damage around the catheter.  Lung damage.  Failure of the chest tube to work properly.  Spreading of cancer cells along the catheter, if you have cancer. Supplies needed:  Vacuum-sealed drainage bottle with attached drainage line.  Sterile dressing.  Sterile alcohol pads.  Sterile gloves.  Valve cap.  Sterile gauze pads, 4  4 inch (10 cm  10 cm).  Tape.  Adhesive dressing.  Sterile foam catheter pad. How to care for your catheter and insertion site  Wash your hands with soap and warm water before and after touching the catheter or insertion site. If soap and water are not available, use hand sanitizer.  Check your bandage (dressing) daily to make sure it is clean and dry.  Keep the skin around the catheter clean and dry.  Check the catheter regularly for any cracks or kinks in the tubing.  Check your catheter insertion site every day for signs of infection. Check for: ? Skin breakdown. ? Redness, swelling, or pain. ? Fluid or blood. ? Warmth. ? Pus or a bad smell. How to drain your catheter You  may need to drain your catheter every day, or more or less often as told by your health care provider. Follow instructions from your health care provider about how to drain your catheter. You may also refer to instructions that come with the drainage system. To drain the catheter: 1. Wash your hands with soap and warm water. If soap and water are not available, use hand sanitizer. 2. Carefully remove the dressing from around the catheter. 3. Wash your hands again. 4. Put on the gloves provided. 5. Prepare the vacuum-sealed drainage bottle and drainage line. Close the drainage line of the vacuum-sealed drainage bottle by squeezing the pinch clamp or rolling the wheel of the roller clamp toward the bottle. The vacuum in the bottle will be lost if the line is not closed completely. 6. Remove the access tip cover from the drainage line. Do not touch the end. Set it on a sterile surface. 7. Remove the catheter valve cap and throw it away. 8. Use an alcohol pad to clean the end of the catheter. 9. Insert the access tip into the catheter valve. Make sure the valve and access tip are securely connected. Listen for a click to confirm that they are connected. 10. Insert the T plunger to break the vacuum seal on the drainage bottle. 11. Open the clamp on the drainage line. 12. Allow the catheter to drain. Keep the catheter and the drainage bottle below the level of your chest. There may be a one-way valve on the end of the  tubing that will allow liquid and air to flow out of the catheter without letting air inside. 13. Drain the amount of fluid as told by your health care provider. It usually takes 5-15 minutes. Do not drain more than 1000 mL of fluid. You may feel a little discomfort while you are draining. If the pain is severe, stop draining and contact your health care provider. 14. After you finish draining the catheter, remove the drainage bottle tubing from the catheter. 15. Use a clean alcohol pad to  wipe the catheter tip. 16. Place a clean cap on the end of the catheter. 17. Use an alcohol pad to clean the skin around the catheter. 18. Allow the skin to air-dry. 19. Put the catheter pad on your skin. Curl the catheter into loops and place it on the pad. Do not place the catheter on your skin. 20. Replace the dressing over the catheter. 21. Discard the drainage bottle as instructed by your health care provider. Do not reuse the drainage bottle. How to change your dressing Change your dressing at least once a week, or more often if needed to keep the dressing dry. Be sure to change the dressing whenever it becomes moist. Your health care provider will tell you how often to change your dressing. 1. Wash your hands with soap and warm water. If soap and water are not available, use hand sanitizer. 2. Gently remove the old dressing. Avoid using scissors to remove the dressing. Sharp objects may damage the catheter. 3. Wash the skin around the insertion site with mild, fragrance-free soap and warm water. Rinse well, then pat the area dry with a clean cloth. 4. Check the skin around the catheter for signs of infection. Check for: ? Skin breakdown. ? Redness, swelling, or pain. ? Fluid or blood. ? Warmth. ? Pus or a bad smell. 5. If your catheter was stitched (sutured) to your skin, look at the suture to make sure it is still anchored in your skin. 6. Do not apply creams, ointments, or alcohol to the area. Let your skin air-dry completely before you apply a new dressing. 7. Curl the catheter into loops and place it on the sterile catheter pad. Do not place the catheter on your skin. 8. If you do not have a pad, use a clean dressing. Slide the dressing under the disk that holds the drainage catheter in place. 9. Use gauze to cover the catheter and the catheter pad. The catheter should rest on the pad or dressing, not on your skin. 10. Tape the dressing to your skin. You may be instructed to use an  adhesive dressing covering instead of gauze and tape. 11. Wash your hands with soap and warm water. If soap and water are not available, use hand sanitizer. General recommendations  Always wash your hands with soap and warm water before and after caring for your catheter and drainage bottle. Use a mild, fragrance-free soap. If soap and water are not available, use hand sanitizer.  Always make sure there are no leaks in the catheter or drainage bottle.  Each time you drain the catheter, note the color and amount of fluid.  Do not touch the tip of the catheter or the drainage bottle tubing.  Do not reuse drainage bottles.  Do not take baths, swim, or use a hot tub until your health care provider approves. Ask your health care provider if you may take showers. You may only be allowed to take sponge baths.  Take  deep breaths regularly, followed by a cough. Doing this can help to prevent lung infection. Contact a health care provider if:  You have any questions about caring for your catheter or drainage bottle.  You still have pain at the catheter insertion site more than 2 days after your procedure.  You have pain while draining your catheter.  Your catheter becomes bent, twisted, or cracked.  The connection between the catheter and the collection bottle becomes loose.  You have any of these around your catheter insertion site or coming from it: ? Skin breakdown. ? Redness, swelling, or pain. ? Fluid or blood. ? Warmth. ? Pus or a bad smell. Get help right away if:  You have a fever or chills.  You have chest pain.  You have dizziness or shortness of breath.  You have severe redness, swelling, or pain at your catheter insertion site.  The catheter comes out.  The catheter is blocked or clogged. Summary  An indwelling pleural catheter is a thin, flexible tube that is inserted under your skin and into your chest. The catheter drains excess fluid that collects in the area  between the chest wall and the lungs (pleural space).  It is important to follow instructions from your health care provider about how to drain and care for your catheter.  Do not touch the tip of the catheter or the drainage bottle tubing.  Always wash your hands with soap and water before and after caring for your catheter and drainage bottle. If soap and water are not available, use hand sanitizer. This information is not intended to replace advice given to you by your health care provider. Make sure you discuss any questions you have with your health care provider. Document Released: 10/21/2016 Document Revised: 10/21/2016 Document Reviewed: 10/21/2016 Elsevier Interactive Patient Education  2019 [place]. Moderate Conscious Sedation, Adult, Care After These instructions provide you with information about caring for yourself after your procedure. Your health care provider may also give you more specific instructions. Your treatment has been planned according to current medical practices, but problems sometimes occur. Call your health care provider if you have any problems or questions after your procedure. What can I expect after the procedure? After your procedure, it is common:  To feel sleepy for several hours.  To feel clumsy and have poor balance for several hours.  To have poor judgment for several hours.  To vomit if you eat too soon. Follow these instructions at home: For at least 24 hours after the procedure:   Do not: ? Participate in activities where you could fall or become injured. ? Drive. ? Use heavy machinery. ? Drink alcohol. ? Take sleeping pills or medicines that cause drowsiness. ? Make important decisions or sign legal documents. ? Take care of children on your own.  Rest. Eating and drinking  Follow the diet recommended by your health care provider.  If you vomit: ? Drink water, juice, or soup when you can drink without vomiting. ? Make sure  you have little or no nausea before eating solid foods. General instructions  Have a responsible adult stay with you until you are awake and alert.  Take over-the-counter and prescription medicines only as told by your health care provider.  If you smoke, do not smoke without supervision.  Keep all follow-up visits as told by your health care provider. This is important. Contact a health care provider if:  You keep feeling nauseous or you keep vomiting.  You feel  light-headed.  You develop a rash.  You have a fever. Get help right away if:  You have trouble breathing. This information is not intended to replace advice given to you by your health care provider. Make sure you discuss any questions you have with your health care provider. Document Released: 04/18/2013 Document Revised: 12/01/2015 Document Reviewed: 10/18/2015 Elsevier Interactive Patient Education  2019 [employer].

## 2018-11-15 NOTE — Telephone Encounter (Signed)
They do not have order yet -it take 2-3 hours to "get the fax from the server". I requested that they send order stat.  I called [facility] nurse said she will send pt home with at least 4 pleurix bottles and a few more if she can. . I contacted wife and she  said she watched pleurix video and feels confident she can change the pleurix bottles and drain pt. I told her to call me when the supply arrives from [employer] [facility] [employer]) and I will contact [place].

## 2018-11-15 NOTE — H&P (Signed)
Chief Complaint: Lung cancer with recurrent pleural effusion  Referring Physician(s): Heilingoetter,Cassandra L  Supervising Physician: Daryll Brod  Patient Status: [facility] - Out-pt  History of Present Illness: Darrell [name] is a 73 y.o. male with history of squamous cell carcinoma of the right lung, dyspnea, recurrent right pleural effusion.   He has undergone 3 previous thoracentesis procedures.  He is here today for a tunneled pleural catheter placement to better manage his effusion.  He is NPO. No nausea/vomiting. No Fever/chills. ROS negative.   Past Medical History:  Diagnosis Date  . Aortic atherosclerosis ([place]) 07/18/2018  . Cancer ([place])   . Cellulitis of right lower extremity   . Chronic kidney disease   . Diabetes mellitus without complication ([place])   . Dyslipidemia   . Hypertension   . Thoracic aortic aneurysm without rupture ([place]) 07/18/2018    Past Surgical History:  Procedure Laterality Date  . IR THORACENTESIS ASP PLEURAL SPACE W/IMG GUIDE  09/05/2018  . IR THORACENTESIS ASP PLEURAL SPACE W/IMG GUIDE  10/11/2018  . IR THORACENTESIS ASP PLEURAL SPACE W/IMG GUIDE  10/30/2018  . PORTACATH PLACEMENT Left 02/21/2018   Procedure: INSERTION PORT-A-CATH;  Surgeon: Grace Isaac, MD;  Location: [facility];  Service: Thoracic;  Laterality: Left;    Allergies: Lisinopril  Medications: Prior to Admission medications   Medication Sig Start Date End Date Taking? Authorizing Provider  albuterol (PROVENTIL HFA;VENTOLIN HFA) 108 (90 Base) MCG/ACT inhaler Inhale 2 puffs into the lungs every 4 (four) hours as needed for wheezing or shortness of breath. 08/03/18   Byrum, Rose Fillers, MD  ASTAXANTHIN PO Take 1 capsule by mouth daily.     [provider]  carvedilol (COREG) 6.25 MG tablet Take 6.25 mg by mouth 2 (two) times daily with a meal.  07/19/16   [provider]  Cinnamon Bark POWD Take 1,000 mg by mouth 2 (two) times daily.     [provider]   cloNIDine (CATAPRES) 0.1 MG tablet Take 0.1 mg by mouth 2 (two) times daily.  08/31/16   [provider]  escitalopram (LEXAPRO) 10 MG tablet Take 10 mg by mouth daily.  11/13/17   [provider]  fenofibrate 160 MG tablet Take 160 mg by mouth daily.  11/13/17   [provider]  Glucosamine Sulfate 1000 MG TABS Take 2,000 mg by mouth daily.     [provider]  glucose blood (PRECISION QID TEST) test strip by Misc.(Non-Drug; Combo Route) route.    [provider]  hydrochlorothiazide (HYDRODIURIL) 25 MG tablet Take 25 mg by mouth daily.  02/06/18   [provider]  Insulin Detemir (LEVEMIR FLEXTOUCH) 100 UNIT/ML Pen Inject 10 Units into the skin daily.  07/18/15   [provider]  Insulin Pen Needle (FIFTY50 PEN NEEDLES) 31G X 8 MM MISC USE AS DIRECTED 06/30/15   [provider]  Lancets MISC by Misc.(Non-Drug; Combo Route) route.    [provider]  lidocaine-prilocaine (EMLA) cream Apply 1 application topically as needed. Patient taking differently: Apply 1 application topically as needed (port).  02/16/18   Curt Bears, MD  losartan (COZAAR) 50 MG tablet Take 50 mg by mouth daily.  01/08/18   [provider]  magnesium oxide (MAG-OX) 400 MG tablet Take 400 mg by mouth daily.    [provider]  metFORMIN (GLUCOPHAGE) 1000 MG tablet Take 1,000 mg by mouth 2 (two) times daily with a meal.  11/13/17   [provider]  Multiple Vitamin (MULTI-VITAMINS) TABS Take 1 tablet by mouth daily.     [provider]  Omega-3 1000 MG CAPS Take 1,200 mg by mouth 2 (two) times daily.     [provider]  pioglitazone (ACTOS) 45 MG tablet Take 45 mg by mouth daily.  02/06/18   [provider]  potassium chloride SA (K-DUR,KLOR-CON) 20 MEQ tablet Take 1 tablet (20 mEq total) by mouth daily. Patient not taking: Reported on 11/13/2018 09/18/18   Heilingoetter, Cassandra L, PA-C  pravastatin  (PRAVACHOL) 40 MG tablet Take 40 mg by mouth daily.  01/08/18   [provider]  prochlorperazine (COMPAZINE) 10 MG tablet TAKE 1 TABLET BY MOUTH EVERY 6 HOURS AS NEEDED FOR NAUSEA OR VOMITING Patient not taking: No sig reported 02/21/18   Curt Bears, MD  traMADol (ULTRAM) 50 MG tablet Take 1 tablet (50 mg total) by mouth every 6 (six) hours as needed for severe pain. 06/23/18   Curt Bears, MD     Family History  Problem Relation Age of Onset  . Breast cancer Mother   . CAD Father     Social History   Socioeconomic History  . Marital status: Married    Spouse name: Not on file  . Number of children: Not on file  . Years of education: Not on file  . Highest education level: Not on file  Occupational History  . Not on file  Social Needs  . Financial resource strain: Not on file  . Food insecurity:    Worry: Not on file    Inability: Not on file  . Transportation needs:    Medical: Not on file    Non-medical: Not on file  Tobacco Use  . Smoking status: Former Smoker    Packs/day: 2.00    Years: 35.00    Pack years: 70.00    Types: Cigarettes    Last attempt to quit: 07/12/1996    Years since quitting: 22.3  . Smokeless tobacco: Never Used  Substance and Sexual Activity  . Alcohol use: Yes    Comment: occasionally  . Drug use: Never  . Sexual activity: Not Currently  Lifestyle  . Physical activity:    Days per week: Not on file    Minutes per session: Not on file  . Stress: Not on file  Relationships  . Social connections:    Talks on phone: Not on file    Gets together: Not on file    Attends religious service: Not on file    Active member of club or organization: Not on file    Attends meetings of clubs or organizations: Not on file    Relationship status: Not on file  Other Topics Concern  . Not on file  Social History Narrative   05-15-18 Unable to ask abuse questions wife with him today     Review of Systems: A 12 point ROS discussed  and pertinent positives are indicated in the HPI above.  All other systems are negative.  Review of Systems  Vital Signs: BP (!) 158/94   Pulse 76   Temp 97.9 F (36.6 C)   Resp 20   Ht 5\' 9"  (1.753 m)   Wt 117.9 kg   SpO2 94%   BMI 38.38 kg/m   Physical Exam Vitals signs reviewed.  Constitutional:      Appearance: Normal appearance.  HENT:     Head: Normocephalic and atraumatic.  Eyes:     Extraocular Movements: Extraocular movements intact.  Neck:     Musculoskeletal: Normal range of motion.  Cardiovascular:     Rate and Rhythm: Normal rate and regular rhythm.  Pulmonary:     Effort: Pulmonary effort is normal.     Comments: Breath sounds diminished on right Abdominal:     General: There is no distension.     Palpations: Abdomen is soft.  Musculoskeletal: Normal range of motion.  Skin:    General: Skin is warm and dry.  Neurological:     General: No focal deficit present.     Mental Status: He is alert and oriented to person, place, and time.  Psychiatric:        Mood and Affect: Mood normal.        Behavior: Behavior normal.        Thought Content: Thought content normal.        Judgment: Judgment normal.     Imaging: Dg Chest 1 View  Result Date: 10/30/2018 CLINICAL DATA:  Status post right thoracentesis today. EXAM: CHEST  1 VIEW COMPARISON:  Single-view of the chest 10/11/2018. FINDINGS: Right pleural effusion is decreased after thoracentesis. No pneumothorax. Mild basilar atelectasis on the left noted. Heart size is upper normal. Port-A-Cath is in place. IMPRESSION: Decreased right pleural effusion after thoracentesis. Negative for pneumothorax. Electronically Signed   By: Inge Rise M.D.   On: 10/30/2018 09:06   Ir Thoracentesis Asp Pleural Space W/img Guide  Result Date: 10/30/2018 INDICATION: Patient with history of small cell carcinoma of the right lung, dyspnea, and recurrent right pleural effusion. Request is made for therapeutic right  thoracentesis. EXAM: ULTRASOUND GUIDED THERAPEUTIC RIGHT THORACENTESIS MEDICATIONS: 10 mL of 1% lidocaine COMPLICATIONS: None immediate. PROCEDURE: An ultrasound guided thoracentesis was thoroughly discussed with the patient and questions answered. The benefits, risks, alternatives and complications were also discussed. The patient understands and wishes to proceed with the procedure. Written consent was obtained. Ultrasound was performed to localize and mark an adequate pocket of fluid in the right chest. The area was then prepped and draped in the normal sterile fashion. 1% Lidocaine was used for local anesthesia. Under ultrasound guidance a 6 Fr Safe-T-Centesis catheter was introduced. Thoracentesis was performed. Procedure was stopped after 2.1 L due to patient coughing. The catheter was removed and a dressing applied. FINDINGS: A total of approximately 2.1 L of clear gold fluid was removed. Procedure was stopped after 2.1 L due to patient coughing. IMPRESSION: Successful ultrasound guided right thoracentesis yielding 2.1 L of pleural fluid. Read by: Earley Abide, PA-C Electronically Signed   By: Aletta Edouard M.D.   On: 10/30/2018 09:12    Labs:  CBC: Recent Labs    10/16/18 1010 10/31/18 1137 11/13/18 1050 11/15/18 1054  WBC 2.7* 3.5* 2.9* 2.8*  HGB 10.3* 10.4* 10.2* 10.2*  HCT 32.5* 33.1* 32.0* 30.9*  PLT 176 177 179 278    COAGS: Recent Labs    02/21/18 1120  INR 1.12  APTT 34    BMP: Recent Labs    10/02/18 1030 10/16/18 1010 10/31/18 1137 11/13/18 1100  NA 138 139 139 138  K 3.7 3.3* 3.5 3.5  CL 102 102 102 101  CO2 25 28 26 28   GLUCOSE 157* 117* 146* 147*  BUN 32* 26* 30* 36*  CALCIUM 9.3 9.3 9.0 9.4  CREATININE 1.47* 1.40* 1.50* 1.74*  GFRNONAA 48* 51* 46* 39*  GFRAA 55* 59* 54* 45*    LIVER FUNCTION TESTS: Recent Labs    10/02/18 1030 10/16/18 1010 10/31/18  1137 11/13/18 1100  BILITOT 0.6 0.6 0.5 0.5  AST 15 14* 16 17  ALT 11 9 9 10   ALKPHOS 34*  37* 35* 35*  PROT 6.7 6.8 6.7 7.1  ALBUMIN 3.5 3.4* 3.4* 3.4*    TUMOR MARKERS: No results for input(s): AFPTM, CEA, CA199, CHROMGRNA in the last 8760 hours.  Assessment and Plan:  Squamous cell carcinoma of the right lung, dyspnea, recurrent right pleural effusion.   Will proceed with placement of tunneled Pleural catheter today by Dr. Annamaria Boots.  Risks and benefits discussed with the patient including bleeding, infection, damage to adjacent structures, malfunction of the catheter with need for additional procedures.  All of the patient's questions were answered, patient is agreeable to proceed. Consent signed and in chart.  Thank you for this interesting consult.  I greatly enjoyed meeting Darrell [name] and look forward to participating in their care.  A copy of this report was sent to the requesting provider on this date.  Electronically Signed: Murrell Redden, PA-C   11/15/2018, 11:47 AM      I spent a total of    25 Minutes in face to face in clinical consultation, greater than 50% of which was counseling/coordinating care for pleurX placement.

## 2018-11-15 NOTE — Procedures (Signed)
Left lung ca, left effusion  S/p [place] AND FLUORO LEFT CHEST PLEURX INSERTION  2L THORA PERFORMED AFTER PLACEMENT  No comp Stable EBL min Full report in pacs

## 2018-11-16 ENCOUNTER — Telehealth: Payer: Self-pay | Admitting: Medical Oncology

## 2018-11-16 NOTE — Telephone Encounter (Signed)
They did not receive MD order page so I faxed it again. It will take 3-4 hours to get off the server - they are a paperless company.

## 2018-11-21 ENCOUNTER — Other Ambulatory Visit: Payer: Self-pay | Admitting: Medical Oncology

## 2018-11-21 DIAGNOSIS — J90 Pleural effusion, not elsewhere classified: Secondary | ICD-10-CM

## 2018-11-27 ENCOUNTER — Inpatient Hospital Stay: Payer: Medicare Other

## 2018-11-27 ENCOUNTER — Inpatient Hospital Stay (HOSPITAL_BASED_OUTPATIENT_CLINIC_OR_DEPARTMENT_OTHER): Payer: Medicare Other | Admitting: Internal Medicine

## 2018-11-27 ENCOUNTER — Encounter: Payer: Self-pay | Admitting: Internal Medicine

## 2018-11-27 ENCOUNTER — Other Ambulatory Visit: Payer: Self-pay

## 2018-11-27 VITALS — BP 116/60 | HR 76 | Temp 98.3°F | Resp 18 | Ht 69.0 in | Wt 261.2 lb

## 2018-11-27 DIAGNOSIS — Z5112 Encounter for antineoplastic immunotherapy: Secondary | ICD-10-CM

## 2018-11-27 DIAGNOSIS — C3411 Malignant neoplasm of upper lobe, right bronchus or lung: Secondary | ICD-10-CM

## 2018-11-27 DIAGNOSIS — I700 Atherosclerosis of aorta: Secondary | ICD-10-CM

## 2018-11-27 DIAGNOSIS — Z79899 Other long term (current) drug therapy: Secondary | ICD-10-CM

## 2018-11-27 DIAGNOSIS — E119 Type 2 diabetes mellitus without complications: Secondary | ICD-10-CM

## 2018-11-27 DIAGNOSIS — Z794 Long term (current) use of insulin: Secondary | ICD-10-CM

## 2018-11-27 DIAGNOSIS — E785 Hyperlipidemia, unspecified: Secondary | ICD-10-CM

## 2018-11-27 DIAGNOSIS — I712 Thoracic aortic aneurysm, without rupture: Secondary | ICD-10-CM

## 2018-11-27 DIAGNOSIS — Z95828 Presence of other vascular implants and grafts: Secondary | ICD-10-CM

## 2018-11-27 DIAGNOSIS — N189 Chronic kidney disease, unspecified: Secondary | ICD-10-CM

## 2018-11-27 DIAGNOSIS — R5383 Other fatigue: Secondary | ICD-10-CM

## 2018-11-27 DIAGNOSIS — C3490 Malignant neoplasm of unspecified part of unspecified bronchus or lung: Secondary | ICD-10-CM

## 2018-11-27 DIAGNOSIS — I129 Hypertensive chronic kidney disease with stage 1 through stage 4 chronic kidney disease, or unspecified chronic kidney disease: Secondary | ICD-10-CM

## 2018-11-27 DIAGNOSIS — J90 Pleural effusion, not elsewhere classified: Secondary | ICD-10-CM | POA: Diagnosis not present

## 2018-11-27 LAB — CMP (CANCER CENTER ONLY)
ALT: 10 U/L (ref 0–44)
AST: 18 U/L (ref 15–41)
Albumin: 3.2 g/dL — ABNORMAL LOW (ref 3.5–5.0)
Alkaline Phosphatase: 35 U/L — ABNORMAL LOW (ref 38–126)
Anion gap: 10 (ref 5–15)
BUN: 34 mg/dL — ABNORMAL HIGH (ref 8–23)
CO2: 26 mmol/L (ref 22–32)
Calcium: 8.8 mg/dL — ABNORMAL LOW (ref 8.9–10.3)
Chloride: 104 mmol/L (ref 98–111)
Creatinine: 1.56 mg/dL — ABNORMAL HIGH (ref 0.61–1.24)
GFR, Est AFR Am: 51 mL/min — ABNORMAL LOW (ref >60)
GFR, Estimated: 44 mL/min — ABNORMAL LOW (ref >60)
Glucose, Bld: 142 mg/dL — ABNORMAL HIGH (ref 70–99)
Potassium: 3.9 mmol/L (ref 3.5–5.1)
Sodium: 140 mmol/L (ref 135–145)
Total Bilirubin: 0.5 mg/dL (ref 0.3–1.2)
Total Protein: 6.5 g/dL (ref 6.5–8.1)

## 2018-11-27 LAB — CBC WITH DIFFERENTIAL (CANCER CENTER ONLY)
Abs Immature Granulocytes: 0.01 10*3/uL (ref 0.00–0.07)
Basophils Absolute: 0.1 10*3/uL (ref 0.0–0.1)
Basophils Relative: 2 %
Eosinophils Absolute: 0.2 10*3/uL (ref 0.0–0.5)
Eosinophils Relative: 7 %
HCT: 30.5 % — ABNORMAL LOW (ref 39.0–52.0)
Hemoglobin: 10 g/dL — ABNORMAL LOW (ref 13.0–17.0)
Immature Granulocytes: 0 %
Lymphocytes Relative: 12 %
Lymphs Abs: 0.4 10*3/uL — ABNORMAL LOW (ref 0.7–4.0)
MCH: 28.2 pg (ref 26.0–34.0)
MCHC: 32.8 g/dL (ref 30.0–36.0)
MCV: 85.9 fL (ref 80.0–100.0)
Monocytes Absolute: 0.4 10*3/uL (ref 0.1–1.0)
Monocytes Relative: 11 %
Neutro Abs: 2.2 10*3/uL (ref 1.7–7.7)
Neutrophils Relative %: 68 %
Platelet Count: 171 10*3/uL (ref 150–400)
RBC: 3.55 MIL/uL — ABNORMAL LOW (ref 4.22–5.81)
RDW: 18.6 % — ABNORMAL HIGH (ref 11.5–15.5)
WBC Count: 3.2 10*3/uL — ABNORMAL LOW (ref 4.0–10.5)
nRBC: 0 % (ref 0.0–0.2)

## 2018-11-27 MED ORDER — SODIUM CHLORIDE 0.9 % IV SOLN
10.0000 mg/kg | Freq: Once | INTRAVENOUS | Status: AC
  Administered 2018-11-27: 1120 mg via INTRAVENOUS
  Filled 2018-11-27: qty 20

## 2018-11-27 MED ORDER — HEPARIN SOD (PORK) LOCK FLUSH 100 UNIT/ML IV SOLN
500.0000 [IU] | Freq: Once | INTRAVENOUS | Status: AC | PRN
  Administered 2018-11-27: 14:00:00 500 [IU]
  Filled 2018-11-27: qty 5

## 2018-11-27 MED ORDER — SODIUM CHLORIDE 0.9% FLUSH
10.0000 mL | INTRAVENOUS | Status: DC | PRN
  Administered 2018-11-27: 10 mL
  Filled 2018-11-27: qty 10

## 2018-11-27 MED ORDER — SODIUM CHLORIDE 0.9 % IV SOLN
Freq: Once | INTRAVENOUS | Status: AC
  Administered 2018-11-27: 13:00:00 via INTRAVENOUS
  Filled 2018-11-27: qty 250

## 2018-11-27 NOTE — Progress Notes (Signed)
Per Dr [name] it is okay to treat pt today with Imfinz and creatinine of 1.56.

## 2018-11-27 NOTE — Patient Instructions (Signed)
[facility] Discharge Instructions for Patients Receiving Chemotherapy  Today you received the following chemotherapy agents: Imfinzi.  To help prevent nausea and vomiting after your treatment, we encourage you to take your nausea medication as directed.   If you develop nausea and vomiting that is not controlled by your nausea medication, call the clinic.   BELOW ARE SYMPTOMS THAT SHOULD BE REPORTED IMMEDIATELY:  *FEVER GREATER THAN 100.5 F  *CHILLS WITH OR WITHOUT FEVER  NAUSEA AND VOMITING THAT IS NOT CONTROLLED WITH YOUR NAUSEA MEDICATION  *UNUSUAL SHORTNESS OF BREATH  *UNUSUAL BRUISING OR BLEEDING  TENDERNESS IN MOUTH AND THROAT WITH OR WITHOUT PRESENCE OF ULCERS  *URINARY PROBLEMS  *BOWEL PROBLEMS  UNUSUAL RASH Items with * indicate a potential emergency and should be followed up as soon as possible.  Feel free to call the clinic should you have any questions or concerns. The clinic phone number is [phone].  Please show the CHEMO ALERT CARD at check-in to the Emergency Department and triage nurse.   

## 2018-11-27 NOTE — Progress Notes (Signed)
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 First dose 02/27/2018.Status post 5 cycles.  CURRENT THERAPY:Consolidationimmunotherapy with Imfinzi 10 mg/kg every 2 weeks.First dose given on 05/17/2018.Status post 11 cycles.  INTERVAL HISTORY: Darrell [name] 73 y.o. male returns to the clinic today for follow-up visit.  The patient is feeling fine today with no concerning complaints except for the shortness of breath with exertion.  He had a Pleurx catheter placed 10 days ago and he has drainage of around 1000 mL every other day.  The patient denied having any chest pain, cough or hemoptysis.  He denied having any fever or chills.  He has no nausea, vomiting, diarrhea or constipation.  He has no headache or visual changes.  He is here today for evaluation before starting cycle #12.   MEDICAL HISTORY: Past Medical History:  Diagnosis Date  . Aortic atherosclerosis ([place]) 07/18/2018  . Cancer ([place])   . Cellulitis of right lower extremity   . Chronic kidney disease   . Diabetes mellitus without complication ([place])   . Dyslipidemia   . Hypertension   . Thoracic aortic aneurysm without rupture ([place]) 07/18/2018    ALLERGIES:  is allergic to lisinopril.  MEDICATIONS:  Current Outpatient Medications  Medication Sig Dispense Refill  . albuterol (PROVENTIL HFA;VENTOLIN HFA) 108 (90 Base) MCG/ACT inhaler Inhale 2 puffs into the lungs every 4 (four) hours as needed for wheezing or shortness of breath. 1 Inhaler 5  . ASTAXANTHIN PO Take 1 capsule by mouth daily.     .  carvedilol (COREG) 6.25 MG tablet Take 6.25 mg by mouth 2 (two) times daily with a meal.     . Cinnamon Bark POWD Take 1,000 mg by mouth 2 (two) times daily.     . cloNIDine (CATAPRES) 0.1 MG tablet Take 0.1 mg by mouth 2 (two) times daily.     Marland Kitchen escitalopram (LEXAPRO) 10 MG tablet Take 10 mg by mouth daily.     . fenofibrate 160 MG tablet Take 160 mg by mouth daily.     . Glucosamine Sulfate 1000 MG TABS Take 2,000 mg by mouth daily.     Marland Kitchen glucose blood (PRECISION QID TEST) test strip by Misc.(Non-Drug; Combo Route) route.    . hydrochlorothiazide (HYDRODIURIL) 25 MG tablet Take 25 mg by mouth daily.     . Insulin Detemir (LEVEMIR FLEXTOUCH) 100 UNIT/ML Pen Inject 10 Units into the skin daily.     . Insulin Pen Needle (FIFTY50 PEN NEEDLES) 31G X 8 MM MISC USE AS DIRECTED    . Lancets MISC by Misc.(Non-Drug; Combo Route) route.    . lidocaine-prilocaine (EMLA) cream Apply 1 application topically as needed. (Patient taking differently: Apply 1 application topically as needed (port). ) 30 g 0  . losartan (COZAAR) 50 MG tablet Take 50 mg by mouth daily.     . magnesium oxide (MAG-OX) 400 MG tablet Take 400 mg by mouth daily.    . metFORMIN (GLUCOPHAGE) 1000 MG tablet Take 1,000 mg by mouth 2 (two) times daily with a meal.     . Multiple Vitamin (MULTI-VITAMINS) TABS Take 1  tablet by mouth daily.     . Omega-3 1000 MG CAPS Take 1,200 mg by mouth 2 (two) times daily.     . pioglitazone (ACTOS) 45 MG tablet Take 45 mg by mouth daily.     . potassium chloride SA (K-DUR,KLOR-CON) 20 MEQ tablet Take 1 tablet (20 mEq total) by mouth daily. (Patient not taking: Reported on 11/13/2018) 7 tablet 0  . pravastatin (PRAVACHOL) 40 MG tablet Take 40 mg by mouth daily.     . prochlorperazine (COMPAZINE) 10 MG tablet TAKE 1 TABLET BY MOUTH EVERY 6 HOURS AS NEEDED FOR NAUSEA OR VOMITING 385 tablet 0  . traMADol (ULTRAM) 50 MG tablet Take 1 tablet (50 mg total) by mouth every 6 (six) hours as needed for severe pain. 20  tablet 0   No current facility-administered medications for this visit.    Facility-Administered Medications Ordered in Other Visits  Medication Dose Route Frequency Provider Last Rate Last Dose  . sodium chloride flush (NS) 0.9 % injection 10 mL  10 mL Intracatheter PRN Curt Bears, MD   10 mL at 06/28/18 1501    SURGICAL HISTORY:  Past Surgical History:  Procedure Laterality Date  . IR GUIDED DRAIN W CATHETER PLACEMENT  11/15/2018  . IR THORACENTESIS ASP PLEURAL SPACE W/IMG GUIDE  09/05/2018  . IR THORACENTESIS ASP PLEURAL SPACE W/IMG GUIDE  10/11/2018  . IR THORACENTESIS ASP PLEURAL SPACE W/IMG GUIDE  10/30/2018  . PORTACATH PLACEMENT Left 02/21/2018   Procedure: INSERTION PORT-A-CATH;  Surgeon: Grace Isaac, MD;  Location: [facility] OR;  Service: Thoracic;  Laterality: Left;    REVIEW OF SYSTEMS:  A comprehensive review of systems was negative except for: Respiratory: positive for dyspnea on exertion   PHYSICAL EXAMINATION: General appearance: alert, cooperative and no distress Head: Normocephalic, without obvious abnormality, atraumatic Neck: no adenopathy, no JVD, supple, symmetrical, trachea midline and thyroid not enlarged, symmetric, no tenderness/mass/nodules Lymph nodes: Cervical, supraclavicular, and axillary nodes normal. Resp: clear to auscultation bilaterally Back: symmetric, no curvature. ROM normal. No CVA tenderness. Cardio: regular rate and rhythm, S1, S2 normal, no murmur, click, rub or gallop GI: soft, non-tender; bowel sounds normal; no masses,  no organomegaly Extremities: extremities normal, atraumatic, no cyanosis or edema  ECOG PERFORMANCE STATUS: 1 - Symptomatic but completely ambulatory  Blood pressure 116/60, pulse 76, temperature 98.3 F (36.8 C), temperature source Oral, resp. rate 18, height 5\' 9"  (1.753 m), weight 261 lb 3.2 oz (118.5 kg), SpO2 99 %.  LABORATORY DATA: Lab Results  Component Value Date   WBC 3.2 (L) 11/27/2018   HGB 10.0 (L)  11/27/2018   HCT 30.5 (L) 11/27/2018   MCV 85.9 11/27/2018   PLT 171 11/27/2018      Chemistry      Component Value Date/Time   NA 138 11/13/2018 1100   K 3.5 11/13/2018 1100   CL 101 11/13/2018 1100   CO2 28 11/13/2018 1100   BUN 36 (H) 11/13/2018 1100   CREATININE 1.74 (H) 11/13/2018 1100      Component Value Date/Time   CALCIUM 9.4 11/13/2018 1100   ALKPHOS 35 (L) 11/13/2018 1100   AST 17 11/13/2018 1100   ALT 10 11/13/2018 1100   BILITOT 0.5 11/13/2018 1100       RADIOGRAPHIC STUDIES: Dg Chest 1 View  Result Date: 10/30/2018 CLINICAL DATA:  Status post right thoracentesis today. EXAM: CHEST  1 VIEW COMPARISON:  Single-view of the chest 10/11/2018. FINDINGS: Right pleural effusion is decreased after thoracentesis. No pneumothorax.  Mild basilar atelectasis on the left noted. Heart size is upper normal. Port-A-Cath is in place. IMPRESSION: Decreased right pleural effusion after thoracentesis. Negative for pneumothorax. Electronically Signed   By: Inge Rise M.D.   On: 10/30/2018 09:06   Ir Guided Niel Hummer W Catheter Placement  Result Date: 11/15/2018 INDICATION: Recurrent symptomatic right pleural effusion, right lung cancer EXAM: ULTRASOUND AND FLUOROSCOPIC TUNNELED RIGHT CHEST PLEURAL DRAIN (PLEURX CATHETER) MEDICATIONS: 2 G ANCEF ADMINISTERED WITHIN 1 HOUR OF THE PROCEDURE ANESTHESIA/SEDATION: Fentanyl 50 mcg IV; Versed 1.0 mg IV Moderate Sedation Time:  14 minutes The patient was continuously monitored during the procedure by the interventional radiology nurse under my direct supervision. COMPLICATIONS: None immediate. PROCEDURE: Informed written consent was obtained from the patient after a thorough discussion of the procedural risks, benefits and alternatives. All questions were addressed. Maximal Sterile Barrier Technique was utilized including caps, mask, sterile gowns, sterile gloves, sterile drape, hand hygiene and skin antiseptic. A timeout was performed prior to the  initiation of the procedure. Previous imaging reviewed. Patient positioned right anterior oblique. Ultrasound was utilized to localized and marked the large right pleural effusion in the mid axillary line through a lower intercostal space. Under sterile conditions and local anesthesia, the introducer needle was advanced into the right pleural space under direct ultrasound. Images obtained for documentation. There was return of clear pleural fluid. Guidewire inserted under ultrasound. In the adjacent chest wall, subcutaneous tunnel was created under sterile conditions local anesthesia. The PleurX catheter was tunneled subcutaneously and inserted into the right chest pleural space through a valve peel-away sheath. Position was reconfirmed with ultrasound and fluoroscopy. Following insertion, a 2 L thoracentesis was performed with the catheter. Catheter secured with a prolene suture. Entry site closed with subcuticular Vicryl suture and Dermabond. Sterile dressing applied. No immediate complication. Patient tolerated the procedure well. IMPRESSION: Successful ultrasound and fluoroscopic tunneled right chest pleural drain (PleurX catheter). 2 L thoracentesis performed after insertion. Electronically Signed   By: Jerilynn Mages.  Shick M.D.   On: 11/15/2018 13:35   Ir Thoracentesis Asp Pleural Space W/img Guide  Result Date: 10/30/2018 INDICATION: Patient with history of small cell carcinoma of the right lung, dyspnea, and recurrent right pleural effusion. Request is made for therapeutic right thoracentesis. EXAM: ULTRASOUND GUIDED THERAPEUTIC RIGHT THORACENTESIS MEDICATIONS: 10 mL of 1% lidocaine COMPLICATIONS: None immediate. PROCEDURE: An ultrasound guided thoracentesis was thoroughly discussed with the patient and questions answered. The benefits, risks, alternatives and complications were also discussed. The patient understands and wishes to proceed with the procedure. Written consent was obtained. Ultrasound was performed  to localize and mark an adequate pocket of fluid in the right chest. The area was then prepped and draped in the normal sterile fashion. 1% Lidocaine was used for local anesthesia. Under ultrasound guidance a 6 Fr Safe-T-Centesis catheter was introduced. Thoracentesis was performed. Procedure was stopped after 2.1 L due to patient coughing. The catheter was removed and a dressing applied. FINDINGS: A total of approximately 2.1 L of clear gold fluid was removed. Procedure was stopped after 2.1 L due to patient coughing. IMPRESSION: Successful ultrasound guided right thoracentesis yielding 2.1 L of pleural fluid. Read by: Earley Abide, PA-C Electronically Signed   By: Aletta Edouard M.D.   On: 10/30/2018 09:12    ASSESSMENT AND PLAN: This is a very pleasant 73 years old white male with a stage IIb/IIIa non-small cell lung cancer, squamous cell carcinoma diagnosed in July 2019. The patient is currently undergoing a course of concurrent chemoradiation with  weekly carboplatin and paclitaxel status post 5 cycles.  He had partial response after the initial induction treatment. The patient was started on treatment with consolidation Imfinzi status post 11 cycles.  He continues to tolerate this treatment well with no concerning adverse effects. I recommended for the patient to proceed with cycle #12 today as planned. For the recurrent pleural effusion, he will continue drainage via the Pleurx catheter. I will see him back for follow-up visit in 2 weeks for evaluation with repeat CT scan of the chest for restaging of his disease. The patient was advised to call immediately if he has any concerning symptoms in the interval. The patient voices understanding of current disease status and treatment options and is in agreement with the current care plan. All questions were answered. The patient knows to call the clinic with any problems, questions or concerns. We can certainly see the patient much sooner if  necessary.  Disclaimer: This note was dictated with voice recognition software. Similar sounding words can inadvertently be transcribed and may not be corrected upon review.

## 2018-12-08 ENCOUNTER — Other Ambulatory Visit: Payer: Self-pay

## 2018-12-08 ENCOUNTER — Ambulatory Visit (HOSPITAL_COMMUNITY)
Admission: RE | Admit: 2018-12-08 | Discharge: 2018-12-08 | Disposition: A | Discharge status: Home / Self Care | Payer: Medicare Other | Source: Ambulatory Visit | Attending: Internal Medicine | Admitting: Internal Medicine

## 2018-12-08 DIAGNOSIS — C3490 Malignant neoplasm of unspecified part of unspecified bronchus or lung: Secondary | ICD-10-CM | POA: Insufficient documentation

## 2018-12-08 MED ORDER — IOHEXOL 300 MG/ML  SOLN
75.0000 mL | Freq: Once | INTRAMUSCULAR | Status: AC | PRN
  Administered 2018-12-08: 60 mL via INTRAVENOUS

## 2018-12-08 MED ORDER — HEPARIN SOD (PORK) LOCK FLUSH 100 UNIT/ML IV SOLN
INTRAVENOUS | Status: AC
  Filled 2018-12-08: qty 5

## 2018-12-08 MED ORDER — SODIUM CHLORIDE (PF) 0.9 % IJ SOLN
INTRAMUSCULAR | Status: AC
  Filled 2018-12-08: qty 50

## 2018-12-12 ENCOUNTER — Encounter: Payer: Self-pay | Admitting: Internal Medicine

## 2018-12-12 ENCOUNTER — Inpatient Hospital Stay (HOSPITAL_BASED_OUTPATIENT_CLINIC_OR_DEPARTMENT_OTHER): Payer: Medicare Other | Admitting: Internal Medicine

## 2018-12-12 ENCOUNTER — Inpatient Hospital Stay: Payer: Medicare Other

## 2018-12-12 ENCOUNTER — Other Ambulatory Visit: Payer: Self-pay

## 2018-12-12 ENCOUNTER — Inpatient Hospital Stay: Payer: Medicare Other | Attending: Internal Medicine

## 2018-12-12 VITALS — BP 151/81 | HR 80 | Temp 98.5°F | Resp 20 | Ht 69.0 in | Wt 264.7 lb

## 2018-12-12 DIAGNOSIS — Z794 Long term (current) use of insulin: Secondary | ICD-10-CM

## 2018-12-12 DIAGNOSIS — Z5112 Encounter for antineoplastic immunotherapy: Secondary | ICD-10-CM | POA: Insufficient documentation

## 2018-12-12 DIAGNOSIS — I700 Atherosclerosis of aorta: Secondary | ICD-10-CM | POA: Insufficient documentation

## 2018-12-12 DIAGNOSIS — E785 Hyperlipidemia, unspecified: Secondary | ICD-10-CM | POA: Insufficient documentation

## 2018-12-12 DIAGNOSIS — Z923 Personal history of irradiation: Secondary | ICD-10-CM | POA: Diagnosis not present

## 2018-12-12 DIAGNOSIS — I10 Essential (primary) hypertension: Secondary | ICD-10-CM

## 2018-12-12 DIAGNOSIS — E1122 Type 2 diabetes mellitus with diabetic chronic kidney disease: Secondary | ICD-10-CM | POA: Insufficient documentation

## 2018-12-12 DIAGNOSIS — Z9221 Personal history of antineoplastic chemotherapy: Secondary | ICD-10-CM

## 2018-12-12 DIAGNOSIS — Z95828 Presence of other vascular implants and grafts: Secondary | ICD-10-CM

## 2018-12-12 DIAGNOSIS — Z79899 Other long term (current) drug therapy: Secondary | ICD-10-CM | POA: Diagnosis not present

## 2018-12-12 DIAGNOSIS — J90 Pleural effusion, not elsewhere classified: Secondary | ICD-10-CM | POA: Insufficient documentation

## 2018-12-12 DIAGNOSIS — C3411 Malignant neoplasm of upper lobe, right bronchus or lung: Secondary | ICD-10-CM | POA: Diagnosis present

## 2018-12-12 DIAGNOSIS — I712 Thoracic aortic aneurysm, without rupture: Secondary | ICD-10-CM | POA: Diagnosis not present

## 2018-12-12 DIAGNOSIS — R5382 Chronic fatigue, unspecified: Secondary | ICD-10-CM

## 2018-12-12 LAB — CBC WITH DIFFERENTIAL (CANCER CENTER ONLY)
Abs Immature Granulocytes: 0.01 10*3/uL (ref 0.00–0.07)
Basophils Absolute: 0.1 10*3/uL (ref 0.0–0.1)
Basophils Relative: 2 %
Eosinophils Absolute: 0.1 10*3/uL (ref 0.0–0.5)
Eosinophils Relative: 4 %
HCT: 30.4 % — ABNORMAL LOW (ref 39.0–52.0)
Hemoglobin: 9.8 g/dL — ABNORMAL LOW (ref 13.0–17.0)
Immature Granulocytes: 0 %
Lymphocytes Relative: 10 %
Lymphs Abs: 0.3 10*3/uL — ABNORMAL LOW (ref 0.7–4.0)
MCH: 28.5 pg (ref 26.0–34.0)
MCHC: 32.2 g/dL (ref 30.0–36.0)
MCV: 88.4 fL (ref 80.0–100.0)
Monocytes Absolute: 0.3 10*3/uL (ref 0.1–1.0)
Monocytes Relative: 10 %
Neutro Abs: 2.5 10*3/uL (ref 1.7–7.7)
Neutrophils Relative %: 74 %
Platelet Count: 160 10*3/uL (ref 150–400)
RBC: 3.44 MIL/uL — ABNORMAL LOW (ref 4.22–5.81)
RDW: 20.1 % — ABNORMAL HIGH (ref 11.5–15.5)
WBC Count: 3.3 10*3/uL — ABNORMAL LOW (ref 4.0–10.5)
nRBC: 0 % (ref 0.0–0.2)

## 2018-12-12 LAB — CMP (CANCER CENTER ONLY)
ALT: 10 U/L (ref 0–44)
AST: 21 U/L (ref 15–41)
Albumin: 3.2 g/dL — ABNORMAL LOW (ref 3.5–5.0)
Alkaline Phosphatase: 38 U/L (ref 38–126)
Anion gap: 9 (ref 5–15)
BUN: 30 mg/dL — ABNORMAL HIGH (ref 8–23)
CO2: 26 mmol/L (ref 22–32)
Calcium: 8.9 mg/dL (ref 8.9–10.3)
Chloride: 105 mmol/L (ref 98–111)
Creatinine: 1.35 mg/dL — ABNORMAL HIGH (ref 0.61–1.24)
GFR, Est AFR Am: >60 mL/min (ref >60)
GFR, Estimated: 53 mL/min — ABNORMAL LOW (ref >60)
Glucose, Bld: 193 mg/dL — ABNORMAL HIGH (ref 70–99)
Potassium: 3.6 mmol/L (ref 3.5–5.1)
Sodium: 140 mmol/L (ref 135–145)
Total Bilirubin: 0.7 mg/dL (ref 0.3–1.2)
Total Protein: 6.8 g/dL (ref 6.5–8.1)

## 2018-12-12 LAB — TSH: TSH: 3.699 u[IU]/mL (ref 0.320–4.118)

## 2018-12-12 MED ORDER — SODIUM CHLORIDE 0.9 % IV SOLN
10.0000 mg/kg | Freq: Once | INTRAVENOUS | Status: AC
  Administered 2018-12-12: 1120 mg via INTRAVENOUS
  Filled 2018-12-12: qty 2.4

## 2018-12-12 MED ORDER — SODIUM CHLORIDE 0.9% FLUSH
10.0000 mL | INTRAVENOUS | Status: DC | PRN
  Administered 2018-12-12: 10 mL
  Filled 2018-12-12: qty 10

## 2018-12-12 MED ORDER — HEPARIN SOD (PORK) LOCK FLUSH 100 UNIT/ML IV SOLN
500.0000 [IU] | Freq: Once | INTRAVENOUS | Status: AC | PRN
  Administered 2018-12-12: 500 [IU]
  Filled 2018-12-12: qty 5

## 2018-12-12 MED ORDER — SODIUM CHLORIDE 0.9 % IV SOLN
Freq: Once | INTRAVENOUS | Status: AC
  Administered 2018-12-12: 10:00:00 via INTRAVENOUS
  Filled 2018-12-12: qty 250

## 2018-12-12 NOTE — Patient Instructions (Signed)

## 2018-12-12 NOTE — Progress Notes (Signed)
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 First dose 02/27/2018.Status post 5 cycles.  CURRENT THERAPY:Consolidationimmunotherapy with Imfinzi 10 mg/kg every 2 weeks.First dose given on 05/17/2018.Status post 12 cycles.  INTERVAL HISTORY: Darrell [name] 73 y.o. male returns to the clinic today for follow-up visit.  His wife was available by phone.  The patient is feeling fine today with no concerning complaints except for the baseline shortness of breath.  He continues to have drainage above the right pleural effusion around 1000 ml every other day.  The patient denied having any pain, cough or hemoptysis.  He denied having any fever or chills.  He has no nausea, diarrhea or constipation.  He had repeat CT scan of the chest performed recently and he is here today for evaluation and discussion of his scan results.   MEDICAL HISTORY: Past Medical History:  Diagnosis Date   Aortic atherosclerosis ([place]) 07/18/2018   Cancer ([place])    Cellulitis of right lower extremity    Chronic kidney disease    Diabetes mellitus without complication ([place])    Dyslipidemia    Hypertension    Thoracic aortic aneurysm without rupture ([place]) 07/18/2018    ALLERGIES:  is allergic to lisinopril.  MEDICATIONS:  Current Outpatient Medications  Medication Sig Dispense Refill   albuterol (PROVENTIL HFA;VENTOLIN HFA) 108 (90 Base) MCG/ACT inhaler Inhale 2 puffs into the lungs every 4 (four) hours as needed for wheezing or shortness of breath. 1 Inhaler 5   ASTAXANTHIN PO Take  1 capsule by mouth daily.      carvedilol (COREG) 6.25 MG tablet Take 6.25 mg by mouth 2 (two) times daily with a meal.      Cinnamon Bark POWD Take 1,000 mg by mouth 2 (two) times daily.      cloNIDine (CATAPRES) 0.1 MG tablet Take 0.1 mg by mouth 2 (two) times daily.      escitalopram (LEXAPRO) 10 MG tablet Take 10 mg by mouth daily.      fenofibrate 160 MG tablet Take 160 mg by mouth daily.      Glucosamine Sulfate 1000 MG TABS Take 2,000 mg by mouth daily.      glucose blood (PRECISION QID TEST) test strip by Misc.(Non-Drug; Combo Route) route.     hydrochlorothiazide (HYDRODIURIL) 25 MG tablet Take 25 mg by mouth daily.      Insulin Detemir (LEVEMIR FLEXTOUCH) 100 UNIT/ML Pen Inject 10 Units into the skin daily.      Insulin Pen Needle (FIFTY50 PEN NEEDLES) 31G X 8 MM MISC USE AS DIRECTED     Lancets MISC by Misc.(Non-Drug; Combo Route) route.     lidocaine-prilocaine (EMLA) cream Apply 1 application topically as needed. (Patient taking differently: Apply 1 application topically as needed (port). ) 30 g 0   losartan (COZAAR) 50 MG tablet Take 50 mg by mouth daily.      magnesium oxide (MAG-OX) 400 MG tablet Take 400 mg by mouth daily.     metFORMIN (GLUCOPHAGE) 1000 MG tablet Take 1,000 mg by mouth 2 (two) times daily with a meal.      Multiple  Vitamin (MULTI-VITAMINS) TABS Take 1 tablet by mouth daily.      Omega-3 1000 MG CAPS Take 1,200 mg by mouth 2 (two) times daily.      pioglitazone (ACTOS) 45 MG tablet Take 45 mg by mouth daily.      potassium chloride SA (K-DUR,KLOR-CON) 20 MEQ tablet Take 1 tablet (20 mEq total) by mouth daily. (Patient not taking: Reported on 11/13/2018) 7 tablet 0   pravastatin (PRAVACHOL) 40 MG tablet Take 40 mg by mouth daily.      prochlorperazine (COMPAZINE) 10 MG tablet TAKE 1 TABLET BY MOUTH EVERY 6 HOURS AS NEEDED FOR NAUSEA OR VOMITING 385 tablet 0   traMADol (ULTRAM) 50 MG tablet Take 1 tablet (50 mg total) by mouth every 6 (six)  hours as needed for severe pain. 20 tablet 0   No current facility-administered medications for this visit.    Facility-Administered Medications Ordered in Other Visits  Medication Dose Route Frequency Provider Last Rate Last Dose   sodium chloride flush (NS) 0.9 % injection 10 mL  10 mL Intracatheter PRN Curt Bears, MD   10 mL at 06/28/18 1501   sodium chloride flush (NS) 0.9 % injection 10 mL  10 mL Intracatheter PRN Curt Bears, MD   10 mL at 12/12/18 9629    SURGICAL HISTORY:  Past Surgical History:  Procedure Laterality Date   IR GUIDED DRAIN W CATHETER PLACEMENT  11/15/2018   IR THORACENTESIS ASP PLEURAL SPACE W/IMG GUIDE  09/05/2018   IR THORACENTESIS ASP PLEURAL SPACE W/IMG GUIDE  10/11/2018   IR THORACENTESIS ASP PLEURAL SPACE W/IMG GUIDE  10/30/2018   PORTACATH PLACEMENT Left 02/21/2018   Procedure: INSERTION PORT-A-CATH;  Surgeon: Grace Isaac, MD;  Location: [facility];  Service: Thoracic;  Laterality: Left;    REVIEW OF SYSTEMS:  Constitutional: positive for fatigue Eyes: negative Ears, nose, mouth, throat, and face: negative Respiratory: positive for dyspnea on exertion Cardiovascular: negative Gastrointestinal: negative Genitourinary:negative Integument/breast: negative Hematologic/lymphatic: negative Musculoskeletal:negative Neurological: negative Behavioral/Psych: negative Endocrine: negative Allergic/Immunologic: negative   PHYSICAL EXAMINATION: General appearance: alert, cooperative, fatigued and no distress Head: Normocephalic, without obvious abnormality, atraumatic Neck: no adenopathy, no JVD, supple, symmetrical, trachea midline and thyroid not enlarged, symmetric, no tenderness/mass/nodules Lymph nodes: Cervical, supraclavicular, and axillary nodes normal. Resp: diminished breath sounds RLL and dullness to percussion RLL Back: symmetric, no curvature. ROM normal. No CVA tenderness. Cardio: regular rate and rhythm, S1, S2 normal, no murmur,  click, rub or gallop GI: soft, non-tender; bowel sounds normal; no masses,  no organomegaly Extremities: extremities normal, atraumatic, no cyanosis or edema Neurologic: Alert and oriented X 3, normal strength and tone. Normal symmetric reflexes. Normal coordination and gait  ECOG PERFORMANCE STATUS: 1 - Symptomatic but completely ambulatory  Blood pressure (!) 151/81, pulse 80, temperature 98.5 F (36.9 C), temperature source Oral, resp. rate 20, height 5\' 9"  (1.753 m), weight 264 lb 11.2 oz (120.1 kg), SpO2 97 %.  LABORATORY DATA: Lab Results  Component Value Date   WBC 3.3 (L) 12/12/2018   HGB 9.8 (L) 12/12/2018   HCT 30.4 (L) 12/12/2018   MCV 88.4 12/12/2018   PLT 160 12/12/2018      Chemistry      Component Value Date/Time   NA 140 11/27/2018 1003   K 3.9 11/27/2018 1003   CL 104 11/27/2018 1003   CO2 26 11/27/2018 1003   BUN 34 (H) 11/27/2018 1003   CREATININE 1.56 (H) 11/27/2018 1003      Component Value Date/Time  CALCIUM 8.8 (L) 11/27/2018 1003   ALKPHOS 35 (L) 11/27/2018 1003   AST 18 11/27/2018 1003   ALT 10 11/27/2018 1003   BILITOT 0.5 11/27/2018 1003       RADIOGRAPHIC STUDIES: Ct Chest W Contrast  Result Date: 12/08/2018 CLINICAL DATA:  Followup lung cancer. EXAM: CT CHEST WITH CONTRAST TECHNIQUE: Multidetector CT imaging of the chest was performed during intravenous contrast administration. CONTRAST:  19mL OMNIPAQUE IOHEXOL 300 MG/ML  SOLN COMPARISON:  CT chest 08/28/2018 and PET-CT 10/13/2018. FINDINGS: Cardiovascular: The heart size appears normal. There is no pericardial effusion identified. Aortic atherosclerosis. The ascending thoracic aorta measures 4.3 cm in AP dimension. Calcification in the LAD, left circumflex and RCA coronary artery. Mediastinum/Nodes: Low-density nodule in right lobe of thyroid gland measures 1.3 cm. No axillary or supraclavicular adenopathy. No mediastinal or left hilar adenopathy. Lungs/Pleura: There is a moderate to large  loculated right pleural effusion with chest tube in place. The loculation appears progressive when compared with 10/13/2018. Large area of masslike architectural distortion within the perihilar right lung is identified. On today's exam this measures 4.1 by 8.8 cm. On the previous exam this measured 8.3 by 4.1 cm. Fibrosis and architectural distortion extends into the right upper lung zone as noted previously. No new pulmonary nodule or mass identified. Upper Abdomen: No acute abnormality. Musculoskeletal: Multi level spondylosis identified within the thoracic spine. No aggressive lytic or sclerotic bone lesion. IMPRESSION: 1. Large area of masslike architectural distortion in the right perihilar lung extending into the right upper lobe is again noted and is compatible with changes secondary to external beam radiation. 2. No specific findings identified to suggest recurrent or metastatic disease. 3. Progressive loculation of moderate right pleural effusion with chest tube in place. 4. Aortic Atherosclerosis (ICD10-I70.0). Coronary artery calcifications. 5. Ascending thoracic aortic aneurysm. Recommend annual imaging followup by CTA or MRA. This recommendation follows 2010 ACCF/AHA/AATS/ACR/ASA/SCA/SCAI/SIR/STS/SVM Guidelines for the Diagnosis and Management of Patients with Thoracic Aortic Disease. Circulation. 2010; 121: H885-O277. Aortic aneurysm NOS (ICD10-I71.9) Electronically Signed   By: Kerby Moors M.D.   On: 12/08/2018 14:39   Ir Guided Niel Hummer W Catheter Placement  Result Date: 11/15/2018 INDICATION: Recurrent symptomatic right pleural effusion, right lung cancer EXAM: ULTRASOUND AND FLUOROSCOPIC TUNNELED RIGHT CHEST PLEURAL DRAIN (PLEURX CATHETER) MEDICATIONS: 2 G ANCEF ADMINISTERED WITHIN 1 HOUR OF THE PROCEDURE ANESTHESIA/SEDATION: Fentanyl 50 mcg IV; Versed 1.0 mg IV Moderate Sedation Time:  14 minutes The patient was continuously monitored during the procedure by the interventional radiology nurse  under my direct supervision. COMPLICATIONS: None immediate. PROCEDURE: Informed written consent was obtained from the patient after a thorough discussion of the procedural risks, benefits and alternatives. All questions were addressed. Maximal Sterile Barrier Technique was utilized including caps, mask, sterile gowns, sterile gloves, sterile drape, hand hygiene and skin antiseptic. A timeout was performed prior to the initiation of the procedure. Previous imaging reviewed. Patient positioned right anterior oblique. Ultrasound was utilized to localized and marked the large right pleural effusion in the mid axillary line through a lower intercostal space. Under sterile conditions and local anesthesia, the introducer needle was advanced into the right pleural space under direct ultrasound. Images obtained for documentation. There was return of clear pleural fluid. Guidewire inserted under ultrasound. In the adjacent chest wall, subcutaneous tunnel was created under sterile conditions local anesthesia. The PleurX catheter was tunneled subcutaneously and inserted into the right chest pleural space through a valve peel-away sheath. Position was reconfirmed with ultrasound and fluoroscopy. Following insertion, a  2 L thoracentesis was performed with the catheter. Catheter secured with a prolene suture. Entry site closed with subcuticular Vicryl suture and Dermabond. Sterile dressing applied. No immediate complication. Patient tolerated the procedure well. IMPRESSION: Successful ultrasound and fluoroscopic tunneled right chest pleural drain (PleurX catheter). 2 L thoracentesis performed after insertion. Electronically Signed   By: Jerilynn Mages.  Shick M.D.   On: 11/15/2018 13:35    ASSESSMENT AND PLAN: This is a very pleasant 73 years old white male with a stage IIb/IIIa non-small cell lung cancer, squamous cell carcinoma diagnosed in July 2019. The patient is currently undergoing a course of concurrent chemoradiation with weekly  carboplatin and paclitaxel status post 5 cycles.  He had partial response after the initial induction treatment. The patient was started on treatment with consolidation Imfinzi status post 12 cycles.  The patient continues to tolerate the treatment well with no concerning adverse effects. He had repeat scan of the chest performed recently.  I personally and independently reviewed the scan images and discussed the results with the patient and his wife today. His scan showed no concerning findings for disease progression. I recommended for the patient to continue his current treatment with Imfinzi and he will proceed with cycle #13 today. For the recurrent right pleural effusion, he will continue with drainage via the Pleurx catheter. For hypertension, he was advised that his blood pressure medication as prescribed and monitor closely at home. The patient will come back for follow-up visit in 2 weeks for evaluation before starting cycle #14. He was advised to call immediately if he has any concerning symptoms in the interval. The patient voices understanding of current disease status and treatment options and is in agreement with the current care plan. All questions were answered. The patient knows to call the clinic with any problems, questions or concerns. We can certainly see the patient much sooner if necessary.  Disclaimer: This note was dictated with voice recognition software. Similar sounding words can inadvertently be transcribed and may not be corrected upon review.

## 2018-12-12 NOTE — Patient Instructions (Signed)
[facility] Discharge Instructions for Patients Receiving Chemotherapy  Today you received the following chemotherapy agents: Imfinzi.  To help prevent nausea and vomiting after your treatment, we encourage you to take your nausea medication as directed.   If you develop nausea and vomiting that is not controlled by your nausea medication, call the clinic.   BELOW ARE SYMPTOMS THAT SHOULD BE REPORTED IMMEDIATELY:  *FEVER GREATER THAN 100.5 F  *CHILLS WITH OR WITHOUT FEVER  NAUSEA AND VOMITING THAT IS NOT CONTROLLED WITH YOUR NAUSEA MEDICATION  *UNUSUAL SHORTNESS OF BREATH  *UNUSUAL BRUISING OR BLEEDING  TENDERNESS IN MOUTH AND THROAT WITH OR WITHOUT PRESENCE OF ULCERS  *URINARY PROBLEMS  *BOWEL PROBLEMS  UNUSUAL RASH Items with * indicate a potential emergency and should be followed up as soon as possible.  Feel free to call the clinic should you have any questions or concerns. The clinic phone number is [phone].  Please show the CHEMO ALERT CARD at check-in to the Emergency Department and triage nurse.   

## 2018-12-13 ENCOUNTER — Telehealth: Payer: Self-pay | Admitting: Internal Medicine

## 2018-12-13 NOTE — Telephone Encounter (Signed)
Added cycles to aptps already scheduled per 6/2 los - pt to get an updated schedule next visit.

## 2018-12-25 ENCOUNTER — Encounter: Payer: Self-pay | Admitting: Internal Medicine

## 2018-12-25 ENCOUNTER — Inpatient Hospital Stay: Payer: Medicare Other

## 2018-12-25 ENCOUNTER — Inpatient Hospital Stay (HOSPITAL_BASED_OUTPATIENT_CLINIC_OR_DEPARTMENT_OTHER): Payer: Medicare Other | Admitting: Internal Medicine

## 2018-12-25 ENCOUNTER — Telehealth: Payer: Self-pay | Admitting: Internal Medicine

## 2018-12-25 ENCOUNTER — Other Ambulatory Visit: Payer: Self-pay

## 2018-12-25 VITALS — BP 116/62 | HR 89 | Temp 98.0°F | Resp 18 | Ht 69.0 in | Wt 269.1 lb

## 2018-12-25 DIAGNOSIS — I700 Atherosclerosis of aorta: Secondary | ICD-10-CM

## 2018-12-25 DIAGNOSIS — Z9221 Personal history of antineoplastic chemotherapy: Secondary | ICD-10-CM

## 2018-12-25 DIAGNOSIS — Z95828 Presence of other vascular implants and grafts: Secondary | ICD-10-CM

## 2018-12-25 DIAGNOSIS — C3411 Malignant neoplasm of upper lobe, right bronchus or lung: Secondary | ICD-10-CM

## 2018-12-25 DIAGNOSIS — J90 Pleural effusion, not elsewhere classified: Secondary | ICD-10-CM

## 2018-12-25 DIAGNOSIS — Z923 Personal history of irradiation: Secondary | ICD-10-CM

## 2018-12-25 DIAGNOSIS — E1122 Type 2 diabetes mellitus with diabetic chronic kidney disease: Secondary | ICD-10-CM

## 2018-12-25 DIAGNOSIS — I10 Essential (primary) hypertension: Secondary | ICD-10-CM

## 2018-12-25 DIAGNOSIS — E785 Hyperlipidemia, unspecified: Secondary | ICD-10-CM

## 2018-12-25 DIAGNOSIS — Z794 Long term (current) use of insulin: Secondary | ICD-10-CM

## 2018-12-25 DIAGNOSIS — Z5112 Encounter for antineoplastic immunotherapy: Secondary | ICD-10-CM | POA: Diagnosis not present

## 2018-12-25 DIAGNOSIS — I712 Thoracic aortic aneurysm, without rupture: Secondary | ICD-10-CM

## 2018-12-25 DIAGNOSIS — Z79899 Other long term (current) drug therapy: Secondary | ICD-10-CM

## 2018-12-25 LAB — CMP (CANCER CENTER ONLY)
ALT: 13 U/L (ref 0–44)
AST: 16 U/L (ref 15–41)
Albumin: 3.3 g/dL — ABNORMAL LOW (ref 3.5–5.0)
Alkaline Phosphatase: 43 U/L (ref 38–126)
Anion gap: 11 (ref 5–15)
BUN: 38 mg/dL — ABNORMAL HIGH (ref 8–23)
CO2: 25 mmol/L (ref 22–32)
Calcium: 8.6 mg/dL — ABNORMAL LOW (ref 8.9–10.3)
Chloride: 104 mmol/L (ref 98–111)
Creatinine: 1.61 mg/dL — ABNORMAL HIGH (ref 0.61–1.24)
GFR, Est AFR Am: 49 mL/min — ABNORMAL LOW (ref >60)
GFR, Estimated: 43 mL/min — ABNORMAL LOW (ref >60)
Glucose, Bld: 119 mg/dL — ABNORMAL HIGH (ref 70–99)
Potassium: 3.9 mmol/L (ref 3.5–5.1)
Sodium: 140 mmol/L (ref 135–145)
Total Bilirubin: 0.5 mg/dL (ref 0.3–1.2)
Total Protein: 6.6 g/dL (ref 6.5–8.1)

## 2018-12-25 LAB — CBC WITH DIFFERENTIAL (CANCER CENTER ONLY)
Abs Immature Granulocytes: 0.01 10*3/uL (ref 0.00–0.07)
Basophils Absolute: 0 10*3/uL (ref 0.0–0.1)
Basophils Relative: 1 %
Eosinophils Absolute: 0.1 10*3/uL (ref 0.0–0.5)
Eosinophils Relative: 4 %
HCT: 30.2 % — ABNORMAL LOW (ref 39.0–52.0)
Hemoglobin: 9.5 g/dL — ABNORMAL LOW (ref 13.0–17.0)
Immature Granulocytes: 0 %
Lymphocytes Relative: 11 %
Lymphs Abs: 0.3 10*3/uL — ABNORMAL LOW (ref 0.7–4.0)
MCH: 28.6 pg (ref 26.0–34.0)
MCHC: 31.5 g/dL (ref 30.0–36.0)
MCV: 91 fL (ref 80.0–100.0)
Monocytes Absolute: 0.3 10*3/uL (ref 0.1–1.0)
Monocytes Relative: 10 %
Neutro Abs: 2.2 10*3/uL (ref 1.7–7.7)
Neutrophils Relative %: 74 %
Platelet Count: 197 10*3/uL (ref 150–400)
RBC: 3.32 MIL/uL — ABNORMAL LOW (ref 4.22–5.81)
RDW: 20.5 % — ABNORMAL HIGH (ref 11.5–15.5)
WBC Count: 2.9 10*3/uL — ABNORMAL LOW (ref 4.0–10.5)
nRBC: 0 % (ref 0.0–0.2)

## 2018-12-25 MED ORDER — SODIUM CHLORIDE 0.9 % IV SOLN
Freq: Once | INTRAVENOUS | Status: AC
  Administered 2018-12-25: 09:00:00 via INTRAVENOUS
  Filled 2018-12-25: qty 250

## 2018-12-25 MED ORDER — SODIUM CHLORIDE 0.9% FLUSH
10.0000 mL | INTRAVENOUS | Status: DC | PRN
  Administered 2018-12-25: 10 mL
  Filled 2018-12-25: qty 10

## 2018-12-25 MED ORDER — HEPARIN SOD (PORK) LOCK FLUSH 100 UNIT/ML IV SOLN
500.0000 [IU] | Freq: Once | INTRAVENOUS | Status: AC | PRN
  Administered 2018-12-25: 500 [IU]
  Filled 2018-12-25: qty 5

## 2018-12-25 MED ORDER — SODIUM CHLORIDE 0.9 % IV SOLN
10.0000 mg/kg | Freq: Once | INTRAVENOUS | Status: AC
  Administered 2018-12-25: 1120 mg via INTRAVENOUS
  Filled 2018-12-25: qty 2.4

## 2018-12-25 NOTE — Progress Notes (Signed)
Cr of 1.61 reported to Dr. [name]. Received OK to treat.

## 2018-12-25 NOTE — Progress Notes (Signed)
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 First dose 02/27/2018.Status post 5 cycles.  CURRENT THERAPY:Consolidationimmunotherapy with Imfinzi 10 mg/kg every 2 weeks.First dose given on 05/17/2018.Status post 13 cycles.  INTERVAL HISTORY: Darrell [name] 73 y.o. male returns to the clinic today for follow-up visit.  The patient is feeling fine today with no concerning complaints.  He continues to tolerate his treatment with Imfinzi fairly well.  He also continues to have drainage of the right pleural effusion around 500 mL every 3 days.  The patient denied having any chest pain, cough or hemoptysis.  He denied having any recent weight loss or night sweats.  He has no nausea, vomiting, diarrhea or constipation.  He has no headache or visual changes.  He is here today for evaluation before starting cycle #14.  MEDICAL HISTORY: Past Medical History:  Diagnosis Date  . Aortic atherosclerosis ([place]) 07/18/2018  . Cancer ([place])   . Cellulitis of right lower extremity   . Chronic kidney disease   . Diabetes mellitus without complication ([place])   . Dyslipidemia   . Hypertension   . Thoracic aortic aneurysm without rupture ([place]) 07/18/2018    ALLERGIES:  is allergic to lisinopril.  MEDICATIONS:  Current Outpatient Medications  Medication Sig Dispense Refill  . albuterol (PROVENTIL HFA;VENTOLIN HFA) 108 (90 Base) MCG/ACT inhaler Inhale 2 puffs into the lungs every 4 (four) hours as needed for wheezing or shortness of breath. 1 Inhaler 5  . carvedilol (COREG) 6.25 MG  tablet Take 6.25 mg by mouth 2 (two) times daily with a meal.     . Cinnamon Bark POWD Take 1,000 mg by mouth 2 (two) times daily.     . cloNIDine (CATAPRES) 0.1 MG tablet Take 0.1 mg by mouth 2 (two) times daily.     . fenofibrate 160 MG tablet Take 160 mg by mouth daily.     . Glucosamine Sulfate 1000 MG TABS Take 2,000 mg by mouth daily.     Marland Kitchen glucose blood (PRECISION QID TEST) test strip by Misc.(Non-Drug; Combo Route) route.    . hydrochlorothiazide (HYDRODIURIL) 25 MG tablet Take 25 mg by mouth daily.     . Insulin Detemir (LEVEMIR FLEXTOUCH) 100 UNIT/ML Pen Inject 10 Units into the skin daily.     . Insulin Pen Needle (FIFTY50 PEN NEEDLES) 31G X 8 MM MISC USE AS DIRECTED    . Lancets MISC by Misc.(Non-Drug; Combo Route) route.    . lidocaine-prilocaine (EMLA) cream Apply 1 application topically as needed. (Patient taking differently: Apply 1 application topically as needed (port). ) 30 g 0  . losartan (COZAAR) 50 MG tablet Take 50 mg by mouth daily.     . magnesium oxide (MAG-OX) 400 MG tablet Take 400 mg by mouth daily.    . metFORMIN (GLUCOPHAGE) 1000 MG tablet Take 1,000 mg by mouth 2 (two) times daily with a meal.     . Multiple Vitamin (MULTI-VITAMINS) TABS Take 1 tablet by mouth daily.     . Omega-3 1000 MG CAPS Take 1,200 mg by mouth 2 (two) times daily.     Marland Kitchen  pioglitazone (ACTOS) 45 MG tablet Take 45 mg by mouth daily.     . potassium chloride SA (K-DUR,KLOR-CON) 20 MEQ tablet Take 1 tablet (20 mEq total) by mouth daily. 7 tablet 0  . pravastatin (PRAVACHOL) 40 MG tablet Take 40 mg by mouth daily.     . ASTAXANTHIN PO Take 1 capsule by mouth daily.     . prochlorperazine (COMPAZINE) 10 MG tablet TAKE 1 TABLET BY MOUTH EVERY 6 HOURS AS NEEDED FOR NAUSEA OR VOMITING (Patient not taking: Reported on 12/25/2018) 385 tablet 0  . traMADol (ULTRAM) 50 MG tablet Take 1 tablet (50 mg total) by mouth every 6 (six) hours as needed for severe pain. (Patient not taking: Reported on 12/25/2018)  20 tablet 0   No current facility-administered medications for this visit.    Facility-Administered Medications Ordered in Other Visits  Medication Dose Route Frequency Provider Last Rate Last Dose  . sodium chloride flush (NS) 0.9 % injection 10 mL  10 mL Intracatheter PRN Curt Bears, MD   10 mL at 06/28/18 1501    SURGICAL HISTORY:  Past Surgical History:  Procedure Laterality Date  . IR GUIDED DRAIN W CATHETER PLACEMENT  11/15/2018  . IR THORACENTESIS ASP PLEURAL SPACE W/IMG GUIDE  09/05/2018  . IR THORACENTESIS ASP PLEURAL SPACE W/IMG GUIDE  10/11/2018  . IR THORACENTESIS ASP PLEURAL SPACE W/IMG GUIDE  10/30/2018  . PORTACATH PLACEMENT Left 02/21/2018   Procedure: INSERTION PORT-A-CATH;  Surgeon: Grace Isaac, MD;  Location: [facility] OR;  Service: Thoracic;  Laterality: Left;    REVIEW OF SYSTEMS:  A comprehensive review of systems was negative except for: Respiratory: positive for dyspnea on exertion   PHYSICAL EXAMINATION: General appearance: alert, cooperative, fatigued and no distress Head: Normocephalic, without obvious abnormality, atraumatic Neck: no adenopathy, no JVD, supple, symmetrical, trachea midline and thyroid not enlarged, symmetric, no tenderness/mass/nodules Lymph nodes: Cervical, supraclavicular, and axillary nodes normal. Resp: clear to auscultation bilaterally Back: symmetric, no curvature. ROM normal. No CVA tenderness. Cardio: regular rate and rhythm, S1, S2 normal, no murmur, click, rub or gallop GI: soft, non-tender; bowel sounds normal; no masses,  no organomegaly Extremities: extremities normal, atraumatic, no cyanosis or edema  ECOG PERFORMANCE STATUS: 1 - Symptomatic but completely ambulatory  Blood pressure 116/62, pulse 89, temperature 98 F (36.7 C), temperature source Oral, resp. rate 18, height 5\' 9"  (1.753 m), weight 269 lb 1.6 oz (122.1 kg), SpO2 99 %.  LABORATORY DATA: Lab Results  Component Value Date   WBC 2.9 (L) 12/25/2018   HGB 9.5  (L) 12/25/2018   HCT 30.2 (L) 12/25/2018   MCV 91.0 12/25/2018   PLT 197 12/25/2018      Chemistry      Component Value Date/Time   NA 140 12/12/2018 0925   K 3.6 12/12/2018 0925   CL 105 12/12/2018 0925   CO2 26 12/12/2018 0925   BUN 30 (H) 12/12/2018 0925   CREATININE 1.35 (H) 12/12/2018 0925      Component Value Date/Time   CALCIUM 8.9 12/12/2018 0925   ALKPHOS 38 12/12/2018 0925   AST 21 12/12/2018 0925   ALT 10 12/12/2018 0925   BILITOT 0.7 12/12/2018 0925       RADIOGRAPHIC STUDIES: Ct Chest W Contrast  Result Date: 12/08/2018 CLINICAL DATA:  Followup lung cancer. EXAM: CT CHEST WITH CONTRAST TECHNIQUE: Multidetector CT imaging of the chest was performed during intravenous contrast administration. CONTRAST:  81mL OMNIPAQUE IOHEXOL 300 MG/ML  SOLN COMPARISON:  CT chest 08/28/2018  and PET-CT 10/13/2018. FINDINGS: Cardiovascular: The heart size appears normal. There is no pericardial effusion identified. Aortic atherosclerosis. The ascending thoracic aorta measures 4.3 cm in AP dimension. Calcification in the LAD, left circumflex and RCA coronary artery. Mediastinum/Nodes: Low-density nodule in right lobe of thyroid gland measures 1.3 cm. No axillary or supraclavicular adenopathy. No mediastinal or left hilar adenopathy. Lungs/Pleura: There is a moderate to large loculated right pleural effusion with chest tube in place. The loculation appears progressive when compared with 10/13/2018. Large area of masslike architectural distortion within the perihilar right lung is identified. On today's exam this measures 4.1 by 8.8 cm. On the previous exam this measured 8.3 by 4.1 cm. Fibrosis and architectural distortion extends into the right upper lung zone as noted previously. No new pulmonary nodule or mass identified. Upper Abdomen: No acute abnormality. Musculoskeletal: Multi level spondylosis identified within the thoracic spine. No aggressive lytic or sclerotic bone lesion. IMPRESSION: 1.  Large area of masslike architectural distortion in the right perihilar lung extending into the right upper lobe is again noted and is compatible with changes secondary to external beam radiation. 2. No specific findings identified to suggest recurrent or metastatic disease. 3. Progressive loculation of moderate right pleural effusion with chest tube in place. 4. Aortic Atherosclerosis (ICD10-I70.0). Coronary artery calcifications. 5. Ascending thoracic aortic aneurysm. Recommend annual imaging followup by CTA or MRA. This recommendation follows 2010 ACCF/AHA/AATS/ACR/ASA/SCA/SCAI/SIR/STS/SVM Guidelines for the Diagnosis and Management of Patients with Thoracic Aortic Disease. Circulation. 2010; 121: O035-D974. Aortic aneurysm NOS (ICD10-I71.9) Electronically Signed   By: Kerby Moors M.D.   On: 12/08/2018 14:39    ASSESSMENT AND PLAN: This is a very pleasant 73 years old white male with a stage IIb/IIIa non-small cell lung cancer, squamous cell carcinoma diagnosed in July 2019. The patient is currently undergoing a course of concurrent chemoradiation with weekly carboplatin and paclitaxel status post 5 cycles.  He had partial response after the initial induction treatment. The patient was started on treatment with consolidation Imfinzi status post 13 cycles.  The patient continues to tolerate this treatment well with no concerning adverse effects. I recommended for him to proceed with cycle #14 today as planned. For the recurrent right pleural effusion, he will continue with drainage via the Pleurx catheter. For hypertension, he was advised that his blood pressure medication as prescribed and monitor closely at home. I will see him back for follow-up visit in 2 weeks for evaluation before starting cycle #15. The patient was advised to call immediately if he has any concerning symptoms in the interval. The patient voices understanding of current disease status and treatment options and is in agreement  with the current care plan. All questions were answered. The patient knows to call the clinic with any problems, questions or concerns. We can certainly see the patient much sooner if necessary.  Disclaimer: This note was dictated with voice recognition software. Similar sounding words can inadvertently be transcribed and may not be corrected upon review.

## 2018-12-25 NOTE — Telephone Encounter (Signed)
Scheduled appt per los. Mailed printout.

## 2018-12-25 NOTE — Patient Instructions (Signed)
[facility] Discharge Instructions for Patients Receiving Chemotherapy  Today you received the following chemotherapy agents: Imfinzi.  To help prevent nausea and vomiting after your treatment, we encourage you to take your nausea medication as directed.   If you develop nausea and vomiting that is not controlled by your nausea medication, call the clinic.   BELOW ARE SYMPTOMS THAT SHOULD BE REPORTED IMMEDIATELY:  *FEVER GREATER THAN 100.5 F  *CHILLS WITH OR WITHOUT FEVER  NAUSEA AND VOMITING THAT IS NOT CONTROLLED WITH YOUR NAUSEA MEDICATION  *UNUSUAL SHORTNESS OF BREATH  *UNUSUAL BRUISING OR BLEEDING  TENDERNESS IN MOUTH AND THROAT WITH OR WITHOUT PRESENCE OF ULCERS  *URINARY PROBLEMS  *BOWEL PROBLEMS  UNUSUAL RASH Items with * indicate a potential emergency and should be followed up as soon as possible.  Feel free to call the clinic should you have any questions or concerns. The clinic phone number is [phone].  Please show the CHEMO ALERT CARD at check-in to the Emergency Department and triage nurse.   

## 2019-01-01 ENCOUNTER — Telehealth: Payer: Self-pay | Admitting: *Deleted

## 2019-01-01 NOTE — Telephone Encounter (Signed)
If he has no drainage for 2 weeks, we will consider sending him back to IR to remove the tube.

## 2019-01-01 NOTE — Telephone Encounter (Signed)
Received call from pt's wife. She states that pt's pleuryx drain is draining less now. She has changed to draining it every other day. Today there was no drainage. She is asking at what point do the physicians decide it can be removed.  She is also asking if it can come out, could it be removed on his next treatment day of 01/08/19.  Please advise

## 2019-01-01 NOTE — Telephone Encounter (Signed)
TCT pt's wife regarding pt's pleuryx tube.  Advised that Dr. [name] would want to see no drainage for 2 weeks before considering taking the tube out. Darrell [name] states that she expected his SOB to improve if there is less fluid around his lung but she states it seems a bit worse. He cannot lay down to sleep. He sleeps in recliner and pt states that he feels more SOB.  She has drained his tube now every 3 days at pt request and got no fluid. Encouraged her to have patient shift his position a bit during draining or before draining to see if the fluid has shifted somewhat and she might be able to get more out.  She states she will try again on Wednesday to see how much is drained. Encouraged her to call back if SOB seems to be progressing. His next appt is 01/08/19. Advised that we could see him in [facility] if needed if breathing becomes worse.   Darrell [name] voiced understanding

## 2019-01-03 ENCOUNTER — Ambulatory Visit (HOSPITAL_COMMUNITY)
Admission: RE | Admit: 2019-01-03 | Discharge: 2019-01-03 | Disposition: A | Discharge status: Home / Self Care | Payer: Medicare Other | Source: Ambulatory Visit | Attending: Internal Medicine | Admitting: Internal Medicine

## 2019-01-03 ENCOUNTER — Other Ambulatory Visit: Payer: Self-pay | Admitting: Medical Oncology

## 2019-01-03 ENCOUNTER — Telehealth: Payer: Self-pay | Admitting: Medical Oncology

## 2019-01-03 ENCOUNTER — Other Ambulatory Visit: Payer: Self-pay

## 2019-01-03 DIAGNOSIS — J90 Pleural effusion, not elsewhere classified: Secondary | ICD-10-CM | POA: Insufficient documentation

## 2019-01-03 NOTE — Telephone Encounter (Signed)
Pleurix drain concern- zero drainage and increased SOB in past 2 days. Repositioning during draining did not help. CXR ordered and wife notified.

## 2019-01-05 ENCOUNTER — Telehealth: Payer: Self-pay | Admitting: Medical Oncology

## 2019-01-05 NOTE — Telephone Encounter (Signed)
Pleurix still not draining. [facility] unable to see pt. Message sent to Dr Annamaria Boots to review CXR for "catheter loops".  Pt states he is in no distress. I instructed wife and pt to go to ED if he has worsening SOB.

## 2019-01-05 NOTE — Telephone Encounter (Signed)
Darrell Keen, MD  Ardeen Garland, RN        Position is fine by cxr 6/24. Slight loop happens and its unchanged. Its not kinked.   Still has sig effusion though. Can try 6 mg TPA in 50cc NS and inject into pleurX, capp 2 -4 hours, then reattempt to drain       Wife notified of radiology recommendations.and that [name] will evaluate pt on Monday.

## 2019-01-06 ENCOUNTER — Encounter (HOSPITAL_COMMUNITY): Payer: Self-pay

## 2019-01-06 ENCOUNTER — Other Ambulatory Visit: Payer: Self-pay

## 2019-01-06 ENCOUNTER — Emergency Department (HOSPITAL_COMMUNITY): Payer: Medicare Other

## 2019-01-06 ENCOUNTER — Emergency Department (HOSPITAL_COMMUNITY)
Admission: EM | Admit: 2019-01-06 | Discharge: 2019-01-06 | Disposition: A | Discharge status: Home / Self Care | Payer: Medicare Other | Attending: Emergency Medicine | Admitting: Emergency Medicine

## 2019-01-06 DIAGNOSIS — Z85828 Personal history of other malignant neoplasm of skin: Secondary | ICD-10-CM | POA: Insufficient documentation

## 2019-01-06 DIAGNOSIS — Z79899 Other long term (current) drug therapy: Secondary | ICD-10-CM | POA: Insufficient documentation

## 2019-01-06 DIAGNOSIS — Z87891 Personal history of nicotine dependence: Secondary | ICD-10-CM | POA: Diagnosis not present

## 2019-01-06 DIAGNOSIS — T85698A Other mechanical complication of other specified internal prosthetic devices, implants and grafts, initial encounter: Secondary | ICD-10-CM | POA: Insufficient documentation

## 2019-01-06 DIAGNOSIS — Y828 Other medical devices associated with adverse incidents: Secondary | ICD-10-CM | POA: Insufficient documentation

## 2019-01-06 DIAGNOSIS — I2510 Atherosclerotic heart disease of native coronary artery without angina pectoris: Secondary | ICD-10-CM | POA: Diagnosis not present

## 2019-01-06 DIAGNOSIS — E1122 Type 2 diabetes mellitus with diabetic chronic kidney disease: Secondary | ICD-10-CM | POA: Insufficient documentation

## 2019-01-06 DIAGNOSIS — Z9049 Acquired absence of other specified parts of digestive tract: Secondary | ICD-10-CM | POA: Insufficient documentation

## 2019-01-06 DIAGNOSIS — J90 Pleural effusion, not elsewhere classified: Secondary | ICD-10-CM | POA: Diagnosis not present

## 2019-01-06 DIAGNOSIS — I129 Hypertensive chronic kidney disease with stage 1 through stage 4 chronic kidney disease, or unspecified chronic kidney disease: Secondary | ICD-10-CM | POA: Diagnosis not present

## 2019-01-06 DIAGNOSIS — J449 Chronic obstructive pulmonary disease, unspecified: Secondary | ICD-10-CM | POA: Insufficient documentation

## 2019-01-06 DIAGNOSIS — N183 Chronic kidney disease, stage 3 (moderate): Secondary | ICD-10-CM | POA: Diagnosis not present

## 2019-01-06 DIAGNOSIS — R0602 Shortness of breath: Secondary | ICD-10-CM | POA: Diagnosis present

## 2019-01-06 DIAGNOSIS — Z794 Long term (current) use of insulin: Secondary | ICD-10-CM | POA: Insufficient documentation

## 2019-01-06 LAB — CBC WITH DIFFERENTIAL/PLATELET
Abs Immature Granulocytes: 0.01 10*3/uL (ref 0.00–0.07)
Basophils Absolute: 0.1 10*3/uL (ref 0.0–0.1)
Basophils Relative: 2 %
Eosinophils Absolute: 0.2 10*3/uL (ref 0.0–0.5)
Eosinophils Relative: 4 %
HCT: 30.3 % — ABNORMAL LOW (ref 39.0–52.0)
Hemoglobin: 9.3 g/dL — ABNORMAL LOW (ref 13.0–17.0)
Immature Granulocytes: 0 %
Lymphocytes Relative: 9 %
Lymphs Abs: 0.3 10*3/uL — ABNORMAL LOW (ref 0.7–4.0)
MCH: 28.2 pg (ref 26.0–34.0)
MCHC: 30.7 g/dL (ref 30.0–36.0)
MCV: 91.8 fL (ref 80.0–100.0)
Monocytes Absolute: 0.4 10*3/uL (ref 0.1–1.0)
Monocytes Relative: 11 %
Neutro Abs: 2.5 10*3/uL (ref 1.7–7.7)
Neutrophils Relative %: 74 %
Platelets: 188 10*3/uL (ref 150–400)
RBC: 3.3 MIL/uL — ABNORMAL LOW (ref 4.22–5.81)
RDW: 20.3 % — ABNORMAL HIGH (ref 11.5–15.5)
WBC: 3.4 10*3/uL — ABNORMAL LOW (ref 4.0–10.5)
nRBC: 0 % (ref 0.0–0.2)

## 2019-01-06 LAB — BASIC METABOLIC PANEL
Anion gap: 11 (ref 5–15)
BUN: 36 mg/dL — ABNORMAL HIGH (ref 8–23)
CO2: 25 mmol/L (ref 22–32)
Calcium: 9.2 mg/dL (ref 8.9–10.3)
Chloride: 102 mmol/L (ref 98–111)
Creatinine, Ser: 1.48 mg/dL — ABNORMAL HIGH (ref 0.61–1.24)
GFR calc Af Amer: 55 mL/min — ABNORMAL LOW (ref >60)
GFR calc non Af Amer: 47 mL/min — ABNORMAL LOW (ref >60)
Glucose, Bld: 111 mg/dL — ABNORMAL HIGH (ref 70–99)
Potassium: 3.9 mmol/L (ref 3.5–5.1)
Sodium: 138 mmol/L (ref 135–145)

## 2019-01-06 MED ORDER — SODIUM CHLORIDE 0.9 % IV SOLN
5.0000 mg | Freq: Once | INTRAVENOUS | Status: DC
  Filled 2019-01-06: qty 5

## 2019-01-06 MED ORDER — SODIUM CHLORIDE 0.9 % IV SOLN
6.0000 mg | Freq: Once | INTRAVENOUS | Status: DC
  Filled 2019-01-06: qty 6

## 2019-01-06 MED ORDER — ALTEPLASE 2 MG IJ SOLR: 6.0000 mg | Freq: Once | INTRAMUSCULAR | Status: DC

## 2019-01-06 MED ORDER — SODIUM CHLORIDE 0.9 % IV SOLN
6.0000 mg | Freq: Once | INTRAVENOUS | Status: AC
  Administered 2019-01-06: 6 mg
  Filled 2019-01-06: qty 6

## 2019-01-06 MED ORDER — HEPARIN SOD (PORK) LOCK FLUSH 100 UNIT/ML IV SOLN
500.0000 [IU] | Freq: Once | INTRAVENOUS | Status: AC
  Administered 2019-01-06: 500 [IU]
  Filled 2019-01-06: qty 5

## 2019-01-06 NOTE — ED Triage Notes (Signed)
Pt reports that he has pleurex drain, and states that it is blocked, and has fluid build up in lungs.

## 2019-01-06 NOTE — ED Notes (Addendum)
Pleurx Vacutainer drained 950 mL fluid. PA Cortni made aware.  Vacutainer disconnected, pts chest tube incision site dressed with foam dressing, sterile gauze, tegaderm.

## 2019-01-06 NOTE — ED Triage Notes (Signed)
Pt is breathing unlabored and able to speak in full sentences. Pt denies pain or distress. Pt reports that drain has not been working since 2 weeks, and is suppose to unclog tubing on Monday.

## 2019-01-06 NOTE — ED Provider Notes (Signed)
[facility] DEPT Provider Note   CSN: [id number] Arrival date & time: 01/06/19  8841    History   Chief Complaint Chief Complaint  Patient presents with  . Shortness of Breath    HPI Darrell [name] is a 73 y.o. male.     HPI   Pt is a 73 y/o male wit ha h/o COPD, stage III CKD, thoracic aortic aneurysm, stage III squamous cell CA, DM, HTN, HLD, who presents to the ED today for eval of SOB.  Pt has known recurrent right pleural effusion with PleurX catheter in place. pt has had decrease in drainage from the tube over the last 2 weeks he has had no output from the PleurX. He has had increasing SOB on exertion and orthopnea. Denies any significant cough, fevers or chest pain.  States that he has not noticed any increased lower extremity swelling.  CXR completed 6/24 with slight looping but no kink in catheter. Persistent effusion present. He had planned to have TPA injected into catheter on Monday 6/29, but states that his sxs worsened over the weekend.   Past Medical History:  Diagnosis Date  . Aortic atherosclerosis ([place]) 07/18/2018  . Cancer ([place])   . Cellulitis of right lower extremity   . Chronic kidney disease   . Diabetes mellitus without complication ([place])   . Dyslipidemia   . Hypertension   . Thoracic aortic aneurysm without rupture ([place]) 07/18/2018    Patient Active Problem List   Diagnosis Date Noted  . Hypokalemia 09/18/2018  . Dyspnea 08/29/2018  . COPD (chronic obstructive pulmonary disease) ([place]) 08/03/2018  . Hemoptysis 07/18/2018  . CKD (chronic kidney disease) stage 3, GFR 30-59 ml/min ([facility]) 07/18/2018  . Lobar pneumonia ([place]) 07/18/2018  . Thoracic aortic aneurysm without rupture ([place]) 07/18/2018  . Aortic atherosclerosis ([place]) 07/18/2018  . Encounter for antineoplastic immunotherapy 05/17/2018  . Port-A-Cath in place 03/27/2018  . Encounter for antineoplastic chemotherapy 02/16/2018  . Stage III squamous cell cancer  02/07/2018  . Goals of care, counseling/discussion 02/07/2018  . ANGIOKERATOMA, BLEEDING 10/06/2006  . DM2 (diabetes mellitus, type 2) ([place]) 10/06/2006  . HYPERLIPIDEMIA 10/06/2006  . HYPERTENSION, BENIGN 10/06/2006  . LATERAL MENISCUS TEAR, RIGHT 10/06/2006  . CHOLECYSTECTOMY, HX OF 10/06/2006    Past Surgical History:  Procedure Laterality Date  . IR GUIDED DRAIN W CATHETER PLACEMENT  11/15/2018  . IR THORACENTESIS ASP PLEURAL SPACE W/IMG GUIDE  09/05/2018  . IR THORACENTESIS ASP PLEURAL SPACE W/IMG GUIDE  10/11/2018  . IR THORACENTESIS ASP PLEURAL SPACE W/IMG GUIDE  10/30/2018  . PORTACATH PLACEMENT Left 02/21/2018   Procedure: INSERTION PORT-A-CATH;  Surgeon: Grace Isaac, MD;  Location: [facility] OR;  Service: Thoracic;  Laterality: Left;        Home Medications    Prior to Admission medications   Medication Sig Start Date End Date Taking? Authorizing Provider  albuterol (PROVENTIL HFA;VENTOLIN HFA) 108 (90 Base) MCG/ACT inhaler Inhale 2 puffs into the lungs every 4 (four) hours as needed for wheezing or shortness of breath. 08/03/18  Yes Byrum, Rose Fillers, MD  carvedilol (COREG) 6.25 MG tablet Take 6.25 mg by mouth 2 (two) times daily with a meal.  07/19/16  Yes [provider]  Cinnamon Bark POWD Take 1,000 mg by mouth 2 (two) times daily.    Yes [provider]  cloNIDine (CATAPRES) 0.1 MG tablet Take 0.1 mg by mouth 2 (two) times daily.  08/31/16  Yes [provider]  fenofibrate 160 MG  tablet Take 160 mg by mouth daily.  11/13/17  Yes [provider]  hydrochlorothiazide (HYDRODIURIL) 25 MG tablet Take 25 mg by mouth daily.  02/06/18  Yes [provider]  Insulin Detemir (LEVEMIR FLEXTOUCH) 100 UNIT/ML Pen Inject 10 Units into the skin daily.  07/18/15  Yes [provider]  Insulin Pen Needle (FIFTY50 PEN NEEDLES) 31G X 8 MM MISC USE AS DIRECTED 06/30/15  Yes [provider]  lidocaine-prilocaine (EMLA) cream Apply 1 application  topically as needed. Patient taking differently: Apply 1 application topically as needed (port).  02/16/18  Yes Curt Bears, MD  losartan (COZAAR) 50 MG tablet Take 50 mg by mouth daily.  01/08/18  Yes [provider]  magnesium oxide (MAG-OX) 400 MG tablet Take 400 mg by mouth daily.   Yes [provider]  metFORMIN (GLUCOPHAGE) 1000 MG tablet Take 1,000 mg by mouth 2 (two) times daily with a meal.  11/13/17  Yes [provider]  Multiple Vitamin (MULTI-VITAMINS) TABS Take 1 tablet by mouth daily.    Yes [provider]  Omega-3 1000 MG CAPS Take 1,200 mg by mouth 2 (two) times daily.    Yes [provider]  pioglitazone (ACTOS) 45 MG tablet Take 45 mg by mouth daily.  02/06/18  Yes [provider]  pravastatin (PRAVACHOL) 40 MG tablet Take 40 mg by mouth daily.  01/08/18  Yes [provider]  traMADol (ULTRAM) 50 MG tablet Take 1 tablet (50 mg total) by mouth every 6 (six) hours as needed for severe pain. 06/23/18  Yes Curt Bears, MD  potassium chloride SA (K-DUR,KLOR-CON) 20 MEQ tablet Take 1 tablet (20 mEq total) by mouth daily. Patient not taking: Reported on 01/06/2019 09/18/18   Heilingoetter, Cassandra L, PA-C  prochlorperazine (COMPAZINE) 10 MG tablet TAKE 1 TABLET BY MOUTH EVERY 6 HOURS AS NEEDED FOR NAUSEA OR VOMITING Patient not taking: Reported on 01/06/2019 02/21/18   Curt Bears, MD    Family History Family History  Problem Relation Age of Onset  . Breast cancer Mother   . CAD Father     Social History Social History   Tobacco Use  . Smoking status: Former Smoker    Packs/day: 2.00    Years: 35.00    Pack years: 70.00    Types: Cigarettes    Quit date: 07/12/1996    Years since quitting: 22.5  . Smokeless tobacco: Never Used  Substance Use Topics  . Alcohol use: Yes    Comment: occasionally  . Drug use: Never     Allergies   Lisinopril   Review of Systems Review of Systems  Constitutional:  Negative for fever.  HENT: Negative for ear pain and sore throat.   Eyes: Negative for visual disturbance.  Respiratory: Positive for shortness of breath. Negative for cough.   Cardiovascular: Negative for chest pain.  Gastrointestinal: Negative for abdominal pain, constipation, diarrhea, nausea and vomiting.  Genitourinary: Negative for dysuria and hematuria.  Musculoskeletal: Negative for back pain.  Skin: Negative for rash.  Neurological: Negative for headaches.  All other systems reviewed and are negative.    Physical Exam Updated Vital Signs BP 138/83   Pulse 85   Temp 97.7 F (36.5 C) (Oral)   Resp (!) 26   SpO2 97%   Physical Exam Vitals signs and nursing note reviewed.  Constitutional:      Appearance: He is well-developed.  HENT:     Head: Normocephalic and atraumatic.  Eyes:  Conjunctiva/sclera: Conjunctivae normal.  Neck:     Musculoskeletal: Neck supple.  Cardiovascular:     Rate and Rhythm: Normal rate and regular rhythm.     Heart sounds: Normal heart sounds. No murmur.  Pulmonary:     Effort: Pulmonary effort is normal. No tachypnea or respiratory distress.     Breath sounds: Examination of the right-lower field reveals decreased breath sounds. Decreased breath sounds present. No wheezing, rhonchi or rales.  Abdominal:     Palpations: Abdomen is soft.     Tenderness: There is no abdominal tenderness.  Musculoskeletal:     Right lower leg: He exhibits no tenderness. Edema present.     Left lower leg: He exhibits no tenderness. Edema present.  Skin:    General: Skin is warm and dry.  Neurological:     Mental Status: He is alert.      ED Treatments / Results  Labs (all labs ordered are listed, but only abnormal results are displayed) Labs Reviewed  CBC WITH DIFFERENTIAL/PLATELET - Abnormal; Notable for the following components:      Result Value   WBC 3.4 (*)    RBC 3.30 (*)    Hemoglobin 9.3 (*)    HCT 30.3 (*)    RDW 20.3 (*)    Lymphs  Abs 0.3 (*)    All other components within normal limits  BASIC METABOLIC PANEL - Abnormal; Notable for the following components:   Glucose, Bld 111 (*)    BUN 36 (*)    Creatinine, Ser 1.48 (*)    GFR calc non Af Amer 47 (*)    GFR calc Af Amer 55 (*)    All other components within normal limits    EKG EKG Interpretation  Date/Time:  Saturday January 06 2019 09:38:39 EDT Ventricular Rate:  88 PR Interval:    QRS Duration: 95 QT Interval:  354 QTC Calculation: 429 R Axis:   16 Text Interpretation:  Sinus rhythm Nonspecific T abnormalities, diffuse leads No significant change since last tracing Confirmed by Isla Pence [phone]) on 01/06/2019 1:26:11 PM   Radiology Dg Chest 2 View  Result Date: 01/06/2019 CLINICAL DATA:  Pleural effusion with PleurX drainage catheter EXAM: CHEST - 2 VIEW COMPARISON:  January 03, 2019 chest radiograph and chest CT Dec 08, 2018 FINDINGS: PleurX catheter tip is in the lateral right base, stable. There is a persistent partially loculated right pleural effusion with atelectasis in the right base. There is perihilar opacity on the right, stable. Right middle lobe consolidation/collapse is grossly stable, better seen on recent CT. Left lung is clear. Heart is upper normal in size with pulmonary vascularity normal. There is aortic atherosclerosis. Port-A-Cath tip is in the superior vena cava. There is degenerative change in the thoracic spine. IMPRESSION: Stable PleurX catheter positioning. Loculated right pleural effusion with right base atelectasis appears essentially stable. Right middle lobe consolidation/collapse is better seen on the lateral view and is felt to be unchanged from recent CT. Right perihilar opacity consistent with consolidation is grossly stable. No new opacity evident. Left lung clear. Stable cardiac silhouette. Aortic Atherosclerosis (ICD10-I70.0). Electronically Signed   By: Lowella Grip III M.D.   On: 01/06/2019 10:12    Procedures  Procedures (including critical care time)  Medications Ordered in ED Medications  heparin lock flush 100 unit/mL (has no administration in time range)  alteplase (tPA/ACTIVASE) 6 mg in sodium chloride 0.9 % 44 mL infusion (6 mg Intracatheter Given by Other 01/06/19 1225)  Initial Impression / Assessment and Plan / ED Course  I have reviewed the triage vital signs and the nursing notes.  Pertinent labs & imaging results that were available during my care of the patient were reviewed by me and considered in my medical decision making (see chart for details).     Final Clinical Impressions(s) / ED Diagnoses   Final diagnoses:  Recurrent pleural effusion on right  Other mechanical complication of other specified internal prosthetic devices, implants and grafts, initial encounter   73 year old male with history of stage III squamous cell carcinoma with recurrent right pleural effusion with Pleurx in place presenting to ED for evaluation of increased shortness of breath and no output from Pleurx catheter for 2 weeks.  Pt satting well on RA and in no distress. Remainder of vitals are stable.   CBC and BMP appear to be at baseline.   CXR shows stable PleurX catheter positioning. Loculated right pleural effusion with right base atelectasis appears essentially stable. Right middle lobe consolidation/collapse is better seen on the lateral view and is felt to be unchanged from recent CT. Right perihilar opacity consistent with consolidation is grossly stable. No new opacity evident. Left lung clear. Stable cardiac silhouette. Aortic Atherosclerosis.  EKG with Sinus rhythm, Nonspecific T abnormalities, diffuse leads No significant change since last tracing  Dr. Gilford Raid administered tPA through the pleurX catheter. Fluid flushed easily.   12:35 PM Reassessed patient.  Pleurx is actively draining serosanguineous fluid.  About 700 cc have drained.  Patient states that he already feels much  improved.  1:25 PM Reassessed pt. He feels much improved. He has had 950 cc of output in the pleurx drain.   Discussed plan for f/u and specific return precautions. He voices understanding of the plan and reasons to return. All questions answered, pt stable for d/c.  Pt seen in conjunction with Dr. Gilford Raid who personally evaluated pt and is in agreement with plan.    ED Discharge Orders    None       Bishop [place] 01/06/19 1327    Isla Pence, MD 01/06/19 1358

## 2019-01-06 NOTE — ED Notes (Signed)
Port deaccessed prior to discharge.  Discharge paperwork reviewed with pt, pt verbalized understanding to return if any issue arise.  Pt wheeled to ED entrance to meet wife.

## 2019-01-06 NOTE — ED Notes (Signed)
Pleurx Vacutainer connected to pt by Legrand Como, RN from PACU, good fluid return noted.  Fluid is serosanguinous, pt able to speak in full sentences, VSS.

## 2019-01-06 NOTE — ED Notes (Signed)
Patient transported to X-ray 

## 2019-01-06 NOTE — Discharge Instructions (Addendum)
Please keep your appointment on 6/29 for re-evaluation.   Please return to the emergency department for any new or worsening symptoms in the meantime including shortness of breath, chest pain, increased swelling to your legs.

## 2019-01-06 NOTE — ED Notes (Signed)
Pts port accessed using sterile technique. Port flushed easily, provided good blood return.

## 2019-01-08 ENCOUNTER — Other Ambulatory Visit: Payer: Self-pay

## 2019-01-08 ENCOUNTER — Inpatient Hospital Stay (HOSPITAL_BASED_OUTPATIENT_CLINIC_OR_DEPARTMENT_OTHER): Payer: Medicare Other | Admitting: Internal Medicine

## 2019-01-08 ENCOUNTER — Inpatient Hospital Stay: Payer: Medicare Other

## 2019-01-08 ENCOUNTER — Encounter: Payer: Self-pay | Admitting: Internal Medicine

## 2019-01-08 VITALS — BP 117/76 | HR 72 | Temp 97.5°F | Resp 18 | Ht 69.0 in | Wt 270.0 lb

## 2019-01-08 DIAGNOSIS — Z79899 Other long term (current) drug therapy: Secondary | ICD-10-CM

## 2019-01-08 DIAGNOSIS — R5382 Chronic fatigue, unspecified: Secondary | ICD-10-CM

## 2019-01-08 DIAGNOSIS — Z5112 Encounter for antineoplastic immunotherapy: Secondary | ICD-10-CM

## 2019-01-08 DIAGNOSIS — J90 Pleural effusion, not elsewhere classified: Secondary | ICD-10-CM | POA: Diagnosis not present

## 2019-01-08 DIAGNOSIS — I700 Atherosclerosis of aorta: Secondary | ICD-10-CM | POA: Diagnosis not present

## 2019-01-08 DIAGNOSIS — C3411 Malignant neoplasm of upper lobe, right bronchus or lung: Secondary | ICD-10-CM

## 2019-01-08 DIAGNOSIS — Z794 Long term (current) use of insulin: Secondary | ICD-10-CM

## 2019-01-08 DIAGNOSIS — E1122 Type 2 diabetes mellitus with diabetic chronic kidney disease: Secondary | ICD-10-CM

## 2019-01-08 DIAGNOSIS — Z923 Personal history of irradiation: Secondary | ICD-10-CM

## 2019-01-08 DIAGNOSIS — I10 Essential (primary) hypertension: Secondary | ICD-10-CM

## 2019-01-08 DIAGNOSIS — I712 Thoracic aortic aneurysm, without rupture: Secondary | ICD-10-CM

## 2019-01-08 DIAGNOSIS — E785 Hyperlipidemia, unspecified: Secondary | ICD-10-CM

## 2019-01-08 DIAGNOSIS — Z95828 Presence of other vascular implants and grafts: Secondary | ICD-10-CM

## 2019-01-08 DIAGNOSIS — Z9221 Personal history of antineoplastic chemotherapy: Secondary | ICD-10-CM

## 2019-01-08 LAB — CMP (CANCER CENTER ONLY)
ALT: 12 U/L (ref 0–44)
AST: 17 U/L (ref 15–41)
Albumin: 3.3 g/dL — ABNORMAL LOW (ref 3.5–5.0)
Alkaline Phosphatase: 43 U/L (ref 38–126)
Anion gap: 9 (ref 5–15)
BUN: 32 mg/dL — ABNORMAL HIGH (ref 8–23)
CO2: 25 mmol/L (ref 22–32)
Calcium: 8.8 mg/dL — ABNORMAL LOW (ref 8.9–10.3)
Chloride: 102 mmol/L (ref 98–111)
Creatinine: 1.59 mg/dL — ABNORMAL HIGH (ref 0.61–1.24)
GFR, Est AFR Am: 50 mL/min — ABNORMAL LOW (ref >60)
GFR, Estimated: 43 mL/min — ABNORMAL LOW (ref >60)
Glucose, Bld: 143 mg/dL — ABNORMAL HIGH (ref 70–99)
Potassium: 4.2 mmol/L (ref 3.5–5.1)
Sodium: 136 mmol/L (ref 135–145)
Total Bilirubin: 0.7 mg/dL (ref 0.3–1.2)
Total Protein: 6.9 g/dL (ref 6.5–8.1)

## 2019-01-08 LAB — CBC WITH DIFFERENTIAL (CANCER CENTER ONLY)
Abs Immature Granulocytes: 0.01 10*3/uL (ref 0.00–0.07)
Basophils Absolute: 0 10*3/uL (ref 0.0–0.1)
Basophils Relative: 1 %
Eosinophils Absolute: 0.2 10*3/uL (ref 0.0–0.5)
Eosinophils Relative: 5 %
HCT: 29.2 % — ABNORMAL LOW (ref 39.0–52.0)
Hemoglobin: 9.4 g/dL — ABNORMAL LOW (ref 13.0–17.0)
Immature Granulocytes: 0 %
Lymphocytes Relative: 9 %
Lymphs Abs: 0.3 10*3/uL — ABNORMAL LOW (ref 0.7–4.0)
MCH: 28.7 pg (ref 26.0–34.0)
MCHC: 32.2 g/dL (ref 30.0–36.0)
MCV: 89 fL (ref 80.0–100.0)
Monocytes Absolute: 0.3 10*3/uL (ref 0.1–1.0)
Monocytes Relative: 11 %
Neutro Abs: 2.3 10*3/uL (ref 1.7–7.7)
Neutrophils Relative %: 74 %
Platelet Count: 184 10*3/uL (ref 150–400)
RBC: 3.28 MIL/uL — ABNORMAL LOW (ref 4.22–5.81)
RDW: 20 % — ABNORMAL HIGH (ref 11.5–15.5)
WBC Count: 3.2 10*3/uL — ABNORMAL LOW (ref 4.0–10.5)
nRBC: 0 % (ref 0.0–0.2)

## 2019-01-08 LAB — TSH: TSH: 4.226 u[IU]/mL — ABNORMAL HIGH (ref 0.320–4.118)

## 2019-01-08 MED ORDER — SODIUM CHLORIDE 0.9% FLUSH
10.0000 mL | INTRAVENOUS | Status: DC | PRN
  Administered 2019-01-08: 10 mL
  Filled 2019-01-08: qty 10

## 2019-01-08 MED ORDER — SODIUM CHLORIDE 0.9 % IV SOLN
Freq: Once | INTRAVENOUS | Status: AC
  Administered 2019-01-08: 11:00:00 via INTRAVENOUS
  Filled 2019-01-08: qty 250

## 2019-01-08 MED ORDER — HEPARIN SOD (PORK) LOCK FLUSH 100 UNIT/ML IV SOLN
500.0000 [IU] | Freq: Once | INTRAVENOUS | Status: AC | PRN
  Administered 2019-01-08: 500 [IU]
  Filled 2019-01-08: qty 5

## 2019-01-08 MED ORDER — SODIUM CHLORIDE 0.9 % IV SOLN
10.0000 mg/kg | Freq: Once | INTRAVENOUS | Status: AC
  Administered 2019-01-08: 1120 mg via INTRAVENOUS
  Filled 2019-01-08: qty 20

## 2019-01-08 NOTE — Patient Instructions (Signed)
[facility] Discharge Instructions for Patients Receiving Chemotherapy  Today you received the following chemotherapy agents:  durvalumab (Ifminzi)  To help prevent nausea and vomiting after your treatment, we encourage you to take your nausea medication as prescribed.   If you develop nausea and vomiting that is not controlled by your nausea medication, call the clinic.   BELOW ARE SYMPTOMS THAT SHOULD BE REPORTED IMMEDIATELY:  *FEVER GREATER THAN 100.5 F  *CHILLS WITH OR WITHOUT FEVER  NAUSEA AND VOMITING THAT IS NOT CONTROLLED WITH YOUR NAUSEA MEDICATION  *UNUSUAL SHORTNESS OF BREATH  *UNUSUAL BRUISING OR BLEEDING  TENDERNESS IN MOUTH AND THROAT WITH OR WITHOUT PRESENCE OF ULCERS  *URINARY PROBLEMS  *BOWEL PROBLEMS  UNUSUAL RASH Items with * indicate a potential emergency and should be followed up as soon as possible.  Feel free to call the clinic should you have any questions or concerns. The clinic phone number is [phone].  Please show the [facility] at check-in to the Emergency Department and triage nurse.  Coronavirus (COVID-19) Are you at risk?  Are you at risk for the Coronavirus (COVID-19)?  To be considered HIGH RISK for Coronavirus (COVID-19), you have to meet the following criteria:  . Traveled to [place], [place], [place], [place] or [place]; or in the [place] to [place], [employer], [place], or [place]; and have fever, cough, and shortness of breath within the last 2 weeks of travel OR . Been in close contact with a person diagnosed with COVID-19 within the last 2 weeks and have fever, cough, and shortness of breath . IF YOU DO NOT MEET THESE CRITERIA, YOU ARE CONSIDERED LOW RISK FOR COVID-19.  What to do if you are HIGH RISK for COVID-19?  Marland Kitchen If you are having a medical emergency, call 911. . Seek medical care right away. Before you go to a doctor's office, urgent care or emergency department, call ahead and tell them  about your recent travel, contact with someone diagnosed with COVID-19, and your symptoms. You should receive instructions from your physician's office regarding next steps of care.  . When you arrive at healthcare provider, tell the healthcare staff immediately you have returned from visiting [place], [place], [place], [place] or [place]; or traveled in the [place] to [place], [place], [place], or [place]; in the last two weeks or you have been in close contact with a person diagnosed with COVID-19 in the last 2 weeks.   . Tell the health care staff about your symptoms: fever, cough and shortness of breath. . After you have been seen by a medical provider, you will be either: o Tested for (COVID-19) and discharged home on quarantine except to seek medical care if symptoms worsen, and asked to  - Stay home and avoid contact with others until you get your results (4-5 days)  - Avoid travel on public transportation if possible (such as bus, train, or airplane) or o Sent to the Emergency Department by EMS for evaluation, COVID-19 testing, and possible admission depending on your condition and test results.  What to do if you are LOW RISK for COVID-19?  Reduce your risk of any infection by using the same precautions used for avoiding the common cold or flu:  Marland Kitchen Wash your hands often with soap and warm water for at least 20 seconds.  If soap and water are not readily available, use an alcohol-based hand sanitizer with at least 60% alcohol.  . If coughing  or sneezing, cover your mouth and nose by coughing or sneezing into the elbow areas of your shirt or coat, into a tissue or into your sleeve (not your hands). . Avoid shaking hands with others and consider head nods or verbal greetings only. . Avoid touching your eyes, nose, or mouth with unwashed hands.  . Avoid close contact with people who are sick. . Avoid places or events with large numbers of people in one location, like concerts or  sporting events. . Carefully consider travel plans you have or are making. . If you are planning any travel outside or inside the [place], visit the CDC's Travelers' Health webpage for the latest health notices. . If you have some symptoms but not all symptoms, continue to monitor at home and seek medical attention if your symptoms worsen. . If you are having a medical emergency, call 911.   [facility] / e-Visit: [email or web address]         [facility]: [facility]: [phone]                   [facility]: [phone]

## 2019-01-08 NOTE — Progress Notes (Signed)
MD reviewed cbc and cmet- ok to treat despite lab results

## 2019-01-08 NOTE — Progress Notes (Signed)
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 First dose 02/27/2018.Status post 5 cycles.  CURRENT THERAPY:Consolidationimmunotherapy with Imfinzi 10 mg/kg every 2 weeks.First dose given on 05/17/2018.Status post 14 cycles.  INTERVAL HISTORY: Darrell [name] 73 y.o. male returns to the clinic today for follow-up visit.  The patient is feeling fine today with no concerning complaints except for the shortness of breath with exertion.  He has TPA of the Pleurx catheter over the weekend and has drainage of 1 L daily for the last 2 days.  He denied having any current chest pain, cough or hemoptysis.  He denied having any fever or chills.  He has no nausea, vomiting, diarrhea or constipation.  He denied having any headache or visual changes.  He is here today for evaluation before starting cycle #15 of his treatment.  MEDICAL HISTORY: Past Medical History:  Diagnosis Date   Aortic atherosclerosis ([place]) 07/18/2018   Cancer ([place])    Cellulitis of right lower extremity    Chronic kidney disease    Diabetes mellitus without complication ([place])    Dyslipidemia    Hypertension    Thoracic aortic aneurysm without rupture ([place]) 07/18/2018    ALLERGIES:  is allergic to lisinopril.  MEDICATIONS:  Current Outpatient Medications  Medication Sig Dispense Refill   albuterol (PROVENTIL HFA;VENTOLIN HFA) 108 (90 Base) MCG/ACT inhaler Inhale 2 puffs into the lungs every 4 (four) hours as needed for wheezing or shortness of breath. 1 Inhaler 5   carvedilol (COREG) 6.25 MG  tablet Take 6.25 mg by mouth 2 (two) times daily with a meal.      Cinnamon Bark POWD Take 1,000 mg by mouth 2 (two) times daily.      cloNIDine (CATAPRES) 0.1 MG tablet Take 0.1 mg by mouth 2 (two) times daily.      fenofibrate 160 MG tablet Take 160 mg by mouth daily.      hydrochlorothiazide (HYDRODIURIL) 25 MG tablet Take 25 mg by mouth daily.      Insulin Detemir (LEVEMIR FLEXTOUCH) 100 UNIT/ML Pen Inject 10 Units into the skin daily.      Insulin Pen [name] (FIFTY50 PEN NEEDLES) 31G X 8 MM MISC USE AS DIRECTED     lidocaine-prilocaine (EMLA) cream Apply 1 application topically as needed. (Patient taking differently: Apply 1 application topically as needed (port). ) 30 g 0   losartan (COZAAR) 50 MG tablet Take 50 mg by mouth daily.      magnesium oxide (MAG-OX) 400 MG tablet Take 400 mg by mouth daily.     metFORMIN (GLUCOPHAGE) 1000 MG tablet Take 1,000 mg by mouth 2 (two) times daily with a meal.      Multiple Vitamin (MULTI-VITAMINS) TABS Take 1 tablet by mouth daily.      Omega-3 1000 MG CAPS Take 1,200 mg by mouth 2 (two) times daily.      pioglitazone (ACTOS) 45 MG tablet Take 45 mg by mouth daily.      potassium chloride SA (K-DUR,KLOR-CON) 20 MEQ tablet Take 1 tablet (20 mEq total) by mouth daily. 7 tablet 0   pravastatin (PRAVACHOL) 40  MG tablet Take 40 mg by mouth daily.      prochlorperazine (COMPAZINE) 10 MG tablet TAKE 1 TABLET BY MOUTH EVERY 6 HOURS AS NEEDED FOR NAUSEA OR VOMITING 385 tablet 0   traMADol (ULTRAM) 50 MG tablet Take 1 tablet (50 mg total) by mouth every 6 (six) hours as needed for severe pain. 20 tablet 0   No current facility-administered medications for this visit.    Facility-Administered Medications Ordered in Other Visits  Medication Dose Route Frequency Provider Last Rate Last Dose   sodium chloride flush (NS) 0.9 % injection 10 mL  10 mL Intracatheter PRN Curt Bears, MD   10 mL at 06/28/18 1501    SURGICAL HISTORY:  Past  Surgical History:  Procedure Laterality Date   IR GUIDED DRAIN W CATHETER PLACEMENT  11/15/2018   IR THORACENTESIS ASP PLEURAL SPACE W/IMG GUIDE  09/05/2018   IR THORACENTESIS ASP PLEURAL SPACE W/IMG GUIDE  10/11/2018   IR THORACENTESIS ASP PLEURAL SPACE W/IMG GUIDE  10/30/2018   PORTACATH PLACEMENT Left 02/21/2018   Procedure: INSERTION PORT-A-CATH;  Surgeon: Grace Isaac, MD;  Location: [facility];  Service: Thoracic;  Laterality: Left;    REVIEW OF SYSTEMS:  A comprehensive review of systems was negative except for: Respiratory: positive for dyspnea on exertion   PHYSICAL EXAMINATION: General appearance: alert, cooperative, fatigued and no distress Head: Normocephalic, without obvious abnormality, atraumatic Neck: no adenopathy, no JVD, supple, symmetrical, trachea midline and thyroid not enlarged, symmetric, no tenderness/mass/nodules Lymph nodes: Cervical, supraclavicular, and axillary nodes normal. Resp: clear to auscultation bilaterally Back: symmetric, no curvature. ROM normal. No CVA tenderness. Cardio: regular rate and rhythm, S1, S2 normal, no murmur, click, rub or gallop GI: soft, non-tender; bowel sounds normal; no masses,  no organomegaly Extremities: extremities normal, atraumatic, no cyanosis or edema  ECOG PERFORMANCE STATUS: 1 - Symptomatic but completely ambulatory  Blood pressure 117/76, pulse 72, temperature (!) 97.5 F (36.4 C), temperature source Oral, resp. rate 18, height 5\' 9"  (1.753 m), weight 270 lb (122.5 kg), SpO2 97 %.  LABORATORY DATA: Lab Results  Component Value Date   WBC 3.2 (L) 01/08/2019   HGB 9.4 (L) 01/08/2019   HCT 29.2 (L) 01/08/2019   MCV 89.0 01/08/2019   PLT 184 01/08/2019      Chemistry      Component Value Date/Time   NA 136 01/08/2019 0913   K 4.2 01/08/2019 0913   CL 102 01/08/2019 0913   CO2 25 01/08/2019 0913   BUN 32 (H) 01/08/2019 0913   CREATININE 1.59 (H) 01/08/2019 0913      Component Value Date/Time   CALCIUM 8.8  (L) 01/08/2019 0913   ALKPHOS 43 01/08/2019 0913   AST 17 01/08/2019 0913   ALT 12 01/08/2019 0913   BILITOT 0.7 01/08/2019 0913       RADIOGRAPHIC STUDIES: Dg Chest 1 View  Result Date: 01/03/2019 CLINICAL DATA:  Increasing sob in pt with hx of pleural effusions with pluerix drainage zero in past 2 days, EXAM: CHEST  1 VIEW COMPARISON:  10/30/2018 FINDINGS: Right PleurX catheter loops at the lung base. Slight increase in moderate right pleural effusion. Right perihilar opacity stable. Left lung clear. Heart size normal. Stable left subclavian power injectable port catheter. No pneumothorax. Visualized bones unremarkable. IMPRESSION: Slight increase in moderate right pleural effusion. Electronically Signed   By: Lucrezia Europe M.D.   On: 01/03/2019 17:28   Dg Chest 2 View  Result Date: 01/06/2019 CLINICAL DATA:  Pleural effusion  with PleurX drainage catheter EXAM: CHEST - 2 VIEW COMPARISON:  January 03, 2019 chest radiograph and chest CT Dec 08, 2018 FINDINGS: PleurX catheter tip is in the lateral right base, stable. There is a persistent partially loculated right pleural effusion with atelectasis in the right base. There is perihilar opacity on the right, stable. Right middle lobe consolidation/collapse is grossly stable, better seen on recent CT. Left lung is clear. Heart is upper normal in size with pulmonary vascularity normal. There is aortic atherosclerosis. Port-A-Cath tip is in the superior vena cava. There is degenerative change in the thoracic spine. IMPRESSION: Stable PleurX catheter positioning. Loculated right pleural effusion with right base atelectasis appears essentially stable. Right middle lobe consolidation/collapse is better seen on the lateral view and is felt to be unchanged from recent CT. Right perihilar opacity consistent with consolidation is grossly stable. No new opacity evident. Left lung clear. Stable cardiac silhouette. Aortic Atherosclerosis (ICD10-I70.0). Electronically  Signed   By: Lowella Grip III M.D.   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 The patient is currently undergoing a course of concurrent chemoradiation with weekly carboplatin and paclitaxel status post 5 cycles.  He had partial response after the initial induction treatment. The patient was started on treatment with consolidation Imfinzi status post 14 cycles.  The patient is feeling much better today after resuming the drainage from the Pleurx catheter. I recommended for him to proceed with cycle #15 today as planned. For the recurrent right pleural effusion, he will continue with drainage via the Pleurx catheter. For hypertension, he was advised that his blood pressure medication as prescribed and monitor closely at home. He will come back for follow-up visit in 2 weeks for evaluation before the next cycle of his treatment. He was advised to call immediately if he has any concerning symptoms in the interval. The patient voices understanding of current disease status and treatment options and is in agreement with the current care plan. All questions were answered. The patient knows to call the clinic with any problems, questions or concerns. We can certainly see the patient much sooner if necessary.  Disclaimer: This note was dictated with voice recognition software. Similar sounding words can inadvertently be transcribed and may not be corrected upon review.

## 2019-01-09 ENCOUNTER — Other Ambulatory Visit (HOSPITAL_COMMUNITY): Payer: Self-pay | Admitting: Interventional Radiology

## 2019-01-09 DIAGNOSIS — R0602 Shortness of breath: Secondary | ICD-10-CM

## 2019-01-09 DIAGNOSIS — J90 Pleural effusion, not elsewhere classified: Secondary | ICD-10-CM

## 2019-01-10 ENCOUNTER — Ambulatory Visit (HOSPITAL_COMMUNITY)
Admission: RE | Admit: 2019-01-10 | Discharge: 2019-01-10 | Disposition: A | Discharge status: Home / Self Care | Payer: Medicare Other | Source: Ambulatory Visit | Attending: Interventional Radiology | Admitting: Interventional Radiology

## 2019-01-10 ENCOUNTER — Encounter (HOSPITAL_COMMUNITY): Payer: Self-pay | Admitting: Interventional Radiology

## 2019-01-10 ENCOUNTER — Other Ambulatory Visit: Payer: Self-pay

## 2019-01-10 ENCOUNTER — Other Ambulatory Visit (HOSPITAL_COMMUNITY): Payer: Self-pay | Admitting: Interventional Radiology

## 2019-01-10 DIAGNOSIS — R0602 Shortness of breath: Secondary | ICD-10-CM | POA: Insufficient documentation

## 2019-01-10 DIAGNOSIS — T859XXA Unspecified complication of internal prosthetic device, implant and graft, initial encounter: Secondary | ICD-10-CM | POA: Diagnosis not present

## 2019-01-10 DIAGNOSIS — J90 Pleural effusion, not elsewhere classified: Secondary | ICD-10-CM

## 2019-01-10 DIAGNOSIS — Y831 Surgical operation with implant of artificial internal device as the cause of abnormal reaction of the patient, or of later complication, without mention of misadventure at the time of the procedure: Secondary | ICD-10-CM | POA: Diagnosis not present

## 2019-01-10 NOTE — Progress Notes (Signed)
Patient presented to IR today for right Pleurx evaluation due to poor output. He was seen on 01/06/19 for same + dyspnea. He underwent tPA dwell which initially improved output and symptoms however he has had decreasing output since then - reporting 700 cc removed 2 days ago when he usually uses removes at least 1 L of fluid. He also reports very slow flow.  Patient evaluated today - please see imaging section of Epic for full dictation.  Will schedule patient for non-contrast CT chest to evaluate tubing position as well as Pleurx revision.  Please call IR with questions or concerns.  Candiss Norse, PA-C

## 2019-01-11 ENCOUNTER — Encounter (HOSPITAL_COMMUNITY): Payer: Self-pay

## 2019-01-11 ENCOUNTER — Other Ambulatory Visit (HOSPITAL_COMMUNITY): Payer: Self-pay | Admitting: Interventional Radiology

## 2019-01-11 DIAGNOSIS — R0602 Shortness of breath: Secondary | ICD-10-CM

## 2019-01-11 DIAGNOSIS — J90 Pleural effusion, not elsewhere classified: Secondary | ICD-10-CM

## 2019-01-16 ENCOUNTER — Other Ambulatory Visit: Payer: Self-pay

## 2019-01-16 ENCOUNTER — Ambulatory Visit (HOSPITAL_COMMUNITY)
Admission: RE | Admit: 2019-01-16 | Discharge: 2019-01-16 | Disposition: A | Discharge status: Home / Self Care | Payer: Medicare Other | Source: Ambulatory Visit | Attending: Interventional Radiology | Admitting: Interventional Radiology

## 2019-01-16 DIAGNOSIS — R0602 Shortness of breath: Secondary | ICD-10-CM | POA: Insufficient documentation

## 2019-01-16 DIAGNOSIS — J90 Pleural effusion, not elsewhere classified: Secondary | ICD-10-CM | POA: Diagnosis present

## 2019-01-18 ENCOUNTER — Other Ambulatory Visit (HOSPITAL_COMMUNITY): Payer: Self-pay | Admitting: Interventional Radiology

## 2019-01-18 DIAGNOSIS — R0602 Shortness of breath: Secondary | ICD-10-CM

## 2019-01-18 DIAGNOSIS — J90 Pleural effusion, not elsewhere classified: Secondary | ICD-10-CM

## 2019-01-19 ENCOUNTER — Other Ambulatory Visit (HOSPITAL_COMMUNITY)
Admission: RE | Admit: 2019-01-19 | Discharge: 2019-01-19 | Disposition: A | Discharge status: Home / Self Care | Payer: Medicare Other | Source: Ambulatory Visit | Attending: Internal Medicine | Admitting: Internal Medicine

## 2019-01-19 DIAGNOSIS — Z01812 Encounter for preprocedural laboratory examination: Secondary | ICD-10-CM | POA: Insufficient documentation

## 2019-01-19 DIAGNOSIS — Z1159 Encounter for screening for other viral diseases: Secondary | ICD-10-CM | POA: Diagnosis not present

## 2019-01-19 LAB — SARS CORONAVIRUS 2 (TAT 6-24 HRS): SARS Coronavirus 2: NEGATIVE

## 2019-01-22 ENCOUNTER — Other Ambulatory Visit: Payer: Self-pay | Admitting: Internal Medicine

## 2019-01-22 ENCOUNTER — Inpatient Hospital Stay: Payer: Medicare Other

## 2019-01-22 ENCOUNTER — Other Ambulatory Visit: Payer: Self-pay

## 2019-01-22 ENCOUNTER — Inpatient Hospital Stay: Payer: Medicare Other | Attending: Internal Medicine

## 2019-01-22 ENCOUNTER — Inpatient Hospital Stay (HOSPITAL_BASED_OUTPATIENT_CLINIC_OR_DEPARTMENT_OTHER): Payer: Medicare Other | Admitting: Internal Medicine

## 2019-01-22 ENCOUNTER — Encounter: Payer: Self-pay | Admitting: Internal Medicine

## 2019-01-22 VITALS — BP 135/72 | HR 78 | Temp 97.8°F | Resp 18 | Ht 69.0 in | Wt 271.6 lb

## 2019-01-22 DIAGNOSIS — I10 Essential (primary) hypertension: Secondary | ICD-10-CM

## 2019-01-22 DIAGNOSIS — N189 Chronic kidney disease, unspecified: Secondary | ICD-10-CM | POA: Insufficient documentation

## 2019-01-22 DIAGNOSIS — Z794 Long term (current) use of insulin: Secondary | ICD-10-CM | POA: Insufficient documentation

## 2019-01-22 DIAGNOSIS — C3411 Malignant neoplasm of upper lobe, right bronchus or lung: Secondary | ICD-10-CM | POA: Insufficient documentation

## 2019-01-22 DIAGNOSIS — Z5112 Encounter for antineoplastic immunotherapy: Secondary | ICD-10-CM | POA: Insufficient documentation

## 2019-01-22 DIAGNOSIS — Z9221 Personal history of antineoplastic chemotherapy: Secondary | ICD-10-CM | POA: Insufficient documentation

## 2019-01-22 DIAGNOSIS — I129 Hypertensive chronic kidney disease with stage 1 through stage 4 chronic kidney disease, or unspecified chronic kidney disease: Secondary | ICD-10-CM | POA: Insufficient documentation

## 2019-01-22 DIAGNOSIS — E785 Hyperlipidemia, unspecified: Secondary | ICD-10-CM | POA: Insufficient documentation

## 2019-01-22 DIAGNOSIS — I700 Atherosclerosis of aorta: Secondary | ICD-10-CM | POA: Diagnosis not present

## 2019-01-22 DIAGNOSIS — I712 Thoracic aortic aneurysm, without rupture: Secondary | ICD-10-CM | POA: Insufficient documentation

## 2019-01-22 DIAGNOSIS — E119 Type 2 diabetes mellitus without complications: Secondary | ICD-10-CM

## 2019-01-22 DIAGNOSIS — Z95828 Presence of other vascular implants and grafts: Secondary | ICD-10-CM

## 2019-01-22 DIAGNOSIS — Z79899 Other long term (current) drug therapy: Secondary | ICD-10-CM | POA: Diagnosis not present

## 2019-01-22 DIAGNOSIS — J910 Malignant pleural effusion: Secondary | ICD-10-CM | POA: Insufficient documentation

## 2019-01-22 LAB — CBC WITH DIFFERENTIAL (CANCER CENTER ONLY)
Abs Immature Granulocytes: 0.01 10*3/uL (ref 0.00–0.07)
Basophils Absolute: 0.1 10*3/uL (ref 0.0–0.1)
Basophils Relative: 2 %
Eosinophils Absolute: 0.2 10*3/uL (ref 0.0–0.5)
Eosinophils Relative: 6 %
HCT: 30.1 % — ABNORMAL LOW (ref 39.0–52.0)
Hemoglobin: 9.5 g/dL — ABNORMAL LOW (ref 13.0–17.0)
Immature Granulocytes: 0 %
Lymphocytes Relative: 12 %
Lymphs Abs: 0.4 10*3/uL — ABNORMAL LOW (ref 0.7–4.0)
MCH: 28.5 pg (ref 26.0–34.0)
MCHC: 31.6 g/dL (ref 30.0–36.0)
MCV: 90.4 fL (ref 80.0–100.0)
Monocytes Absolute: 0.3 10*3/uL (ref 0.1–1.0)
Monocytes Relative: 11 %
Neutro Abs: 2.2 10*3/uL (ref 1.7–7.7)
Neutrophils Relative %: 69 %
Platelet Count: 195 10*3/uL (ref 150–400)
RBC: 3.33 MIL/uL — ABNORMAL LOW (ref 4.22–5.81)
RDW: 19.5 % — ABNORMAL HIGH (ref 11.5–15.5)
WBC Count: 3.1 10*3/uL — ABNORMAL LOW (ref 4.0–10.5)
nRBC: 0 % (ref 0.0–0.2)

## 2019-01-22 LAB — CMP (CANCER CENTER ONLY)
ALT: 10 U/L (ref 0–44)
AST: 18 U/L (ref 15–41)
Albumin: 3.3 g/dL — ABNORMAL LOW (ref 3.5–5.0)
Alkaline Phosphatase: 43 U/L (ref 38–126)
Anion gap: 11 (ref 5–15)
BUN: 36 mg/dL — ABNORMAL HIGH (ref 8–23)
CO2: 24 mmol/L (ref 22–32)
Calcium: 8.7 mg/dL — ABNORMAL LOW (ref 8.9–10.3)
Chloride: 103 mmol/L (ref 98–111)
Creatinine: 1.6 mg/dL — ABNORMAL HIGH (ref 0.61–1.24)
GFR, Est AFR Am: 50 mL/min — ABNORMAL LOW (ref >60)
GFR, Estimated: 43 mL/min — ABNORMAL LOW (ref >60)
Glucose, Bld: 102 mg/dL — ABNORMAL HIGH (ref 70–99)
Potassium: 4.1 mmol/L (ref 3.5–5.1)
Sodium: 138 mmol/L (ref 135–145)
Total Bilirubin: 0.6 mg/dL (ref 0.3–1.2)
Total Protein: 6.8 g/dL (ref 6.5–8.1)

## 2019-01-22 MED ORDER — SODIUM CHLORIDE 0.9% FLUSH
10.0000 mL | INTRAVENOUS | Status: DC | PRN
  Administered 2019-01-22: 10 mL
  Filled 2019-01-22: qty 10

## 2019-01-22 MED ORDER — SODIUM CHLORIDE 0.9 % IV SOLN
10.0000 mg/kg | Freq: Once | INTRAVENOUS | Status: AC
  Administered 2019-01-22: 10:00:00 1120 mg via INTRAVENOUS
  Filled 2019-01-22: qty 20

## 2019-01-22 MED ORDER — SODIUM CHLORIDE 0.9 % IV SOLN
Freq: Once | INTRAVENOUS | Status: AC
  Administered 2019-01-22: 09:00:00 via INTRAVENOUS
  Filled 2019-01-22: qty 250

## 2019-01-22 MED ORDER — HEPARIN SOD (PORK) LOCK FLUSH 100 UNIT/ML IV SOLN
500.0000 [IU] | Freq: Once | INTRAVENOUS | Status: AC | PRN
  Administered 2019-01-22: 500 [IU]
  Filled 2019-01-22: qty 5

## 2019-01-22 NOTE — Patient Instructions (Addendum)
[facility] Discharge Instructions for Patients Receiving Chemotherapy  Today you received the following chemotherapy agents:  Infinzi (durvalumab)  To help prevent nausea and vomiting after your treatment, we encourage you to take your nausea medication as prescribed.   If you develop nausea and vomiting that is not controlled by your nausea medication, call the clinic.   BELOW ARE SYMPTOMS THAT SHOULD BE REPORTED IMMEDIATELY:  *FEVER GREATER THAN 100.5 F  *CHILLS WITH OR WITHOUT FEVER  NAUSEA AND VOMITING THAT IS NOT CONTROLLED WITH YOUR NAUSEA MEDICATION  *UNUSUAL SHORTNESS OF BREATH  *UNUSUAL BRUISING OR BLEEDING  TENDERNESS IN MOUTH AND THROAT WITH OR WITHOUT PRESENCE OF ULCERS  *URINARY PROBLEMS  *BOWEL PROBLEMS  UNUSUAL RASH Items with * indicate a potential emergency and should be followed up as soon as possible.  Feel free to call the clinic should you have any questions or concerns. The clinic phone number is [phone].  Please show the [facility] at check-in to the Emergency Department and triage nurse.  Coronavirus (COVID-19) Are you at risk?  Are you at risk for the Coronavirus (COVID-19)?  To be considered HIGH RISK for Coronavirus (COVID-19), you have to meet the following criteria:  . Traveled to [place], [place], [place], [place] or [place]; or in the [place] to [place], [place], [place], or [place]; and have fever, cough, and shortness of breath within the last 2 weeks of travel OR . Been in close contact with a person diagnosed with COVID-19 within the last 2 weeks and have fever, cough, and shortness of breath . IF YOU DO NOT MEET THESE CRITERIA, YOU ARE CONSIDERED LOW RISK FOR COVID-19.  What to do if you are HIGH RISK for COVID-19?  Marland Kitchen If you are having a medical emergency, call 911. . Seek medical care right away. Before you go to a doctor's office, urgent care or emergency department, call ahead and tell them  about your recent travel, contact with someone diagnosed with COVID-19, and your symptoms. You should receive instructions from your physician's office regarding next steps of care.  . When you arrive at healthcare provider, tell the healthcare staff immediately you have returned from visiting [place], [place], [place], [place] or [place]; or traveled in the [place] to [place], [place], [place], or [place]; in the last two weeks or you have been in close contact with a person diagnosed with COVID-19 in the last 2 weeks.   . Tell the health care staff about your symptoms: fever, cough and shortness of breath. . After you have been seen by a medical provider, you will be either: o Tested for (COVID-19) and discharged home on quarantine except to seek medical care if symptoms worsen, and asked to  - Stay home and avoid contact with others until you get your results (4-5 days)  - Avoid travel on public transportation if possible (such as bus, train, or airplane) or o Sent to the Emergency Department by EMS for evaluation, COVID-19 testing, and possible admission depending on your condition and test results.  What to do if you are LOW RISK for COVID-19?  Reduce your risk of any infection by using the same precautions used for avoiding the common cold or flu:  Marland Kitchen Wash your hands often with soap and warm water for at least 20 seconds.  If soap and water are not readily available, use an alcohol-based hand sanitizer with at least 60% alcohol.  . If coughing  or sneezing, cover your mouth and nose by coughing or sneezing into the elbow areas of your shirt or coat, into a tissue or into your sleeve (not your hands). . Avoid shaking hands with others and consider head nods or verbal greetings only. . Avoid touching your eyes, nose, or mouth with unwashed hands.  . Avoid close contact with people who are sick. . Avoid places or events with large numbers of people in one location, like concerts or  sporting events. . Carefully consider travel plans you have or are making. . If you are planning any travel outside or inside the [place], visit the CDC's Travelers' Health webpage for the latest health notices. . If you have some symptoms but not all symptoms, continue to monitor at home and seek medical attention if your symptoms worsen. . If you are having a medical emergency, call 911.   [place] / e-Visit: [email or web address]         [facility]: [facility]: [phone]                   [facility]: [phone]

## 2019-01-22 NOTE — Progress Notes (Signed)
Per Dr [name] OK to proceed with Imfinzi with CRT of 1.6

## 2019-01-22 NOTE — Progress Notes (Signed)
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 First dose 02/27/2018.Status post 5 cycles.  CURRENT THERAPY:Consolidationimmunotherapy with Imfinzi 10 mg/kg every 2 weeks.First dose given on 05/17/2018.Status post 15 cycles.  INTERVAL HISTORY: Darrell [name] 73 y.o. male returns to the clinic today for follow-up visit.  The patient continues to have shortness of breath at baseline increased with exertion.  He has less drainage of the right pleural effusion because of dysfunctional right Pleurx.  He is scheduled to see interventional radiology tomorrow for reevaluation.  He denied having any chest pain, cough or hemoptysis.  He denied having any fever or chills.  He has no nausea, vomiting, diarrhea or constipation.  He denied having any headache or visual changes.  He had repeat CT scan of the chest performed recently and he is here for evaluation and discussion of his scan results and treatment options.  MEDICAL HISTORY: Past Medical History:  Diagnosis Date   Aortic atherosclerosis ([place]) 07/18/2018   Cancer ([place])    Cellulitis of right lower extremity    Chronic kidney disease    Diabetes mellitus without complication ([place])    Dyslipidemia    Hypertension    Thoracic aortic aneurysm without rupture ([place]) 07/18/2018    ALLERGIES:  is allergic to lisinopril.  MEDICATIONS:  Current Outpatient Medications  Medication Sig Dispense Refill   albuterol (PROVENTIL HFA;VENTOLIN HFA) 108 (90 Base) MCG/ACT inhaler Inhale 2 puffs into the lungs every 4 (four)  hours as needed for wheezing or shortness of breath. 1 Inhaler 5   carvedilol (COREG) 6.25 MG tablet Take 6.25 mg by mouth 2 (two) times daily with a meal.      Cinnamon Bark POWD Take 1,000 mg by mouth 2 (two) times daily.      cloNIDine (CATAPRES) 0.1 MG tablet Take 0.1 mg by mouth 2 (two) times daily.      fenofibrate 160 MG tablet Take 160 mg by mouth daily.      hydrochlorothiazide (HYDRODIURIL) 25 MG tablet Take 25 mg by mouth daily.      Insulin Detemir (LEVEMIR FLEXTOUCH) 100 UNIT/ML Pen Inject 10 Units into the skin daily.      Insulin Pen Needle (FIFTY50 PEN NEEDLES) 31G X 8 MM MISC USE AS DIRECTED     lidocaine-prilocaine (EMLA) cream Apply 1 application topically as needed. (Patient taking differently: Apply 1 application topically as needed (port). ) 30 g 0   losartan (COZAAR) 50 MG tablet Take 50 mg by mouth daily.      magnesium oxide (MAG-OX) 400 MG tablet Take 400 mg by mouth daily.     metFORMIN (GLUCOPHAGE) 1000 MG tablet Take 1,000 mg by mouth 2 (two) times daily with a meal.      Multiple Vitamin (MULTI-VITAMINS) TABS Take 1 tablet by mouth daily.      Omega-3 1000 MG CAPS Take 1,200 mg by mouth 2 (two) times daily.      pioglitazone (ACTOS) 45 MG tablet Take 45 mg by mouth daily.      potassium chloride SA (K-DUR,KLOR-CON) 20 MEQ tablet Take 1 tablet (20 mEq total) by mouth  daily. 7 tablet 0   pravastatin (PRAVACHOL) 40 MG tablet Take 40 mg by mouth daily.      prochlorperazine (COMPAZINE) 10 MG tablet TAKE 1 TABLET BY MOUTH EVERY 6 HOURS AS NEEDED FOR NAUSEA OR VOMITING 385 tablet 0   traMADol (ULTRAM) 50 MG tablet Take 1 tablet (50 mg total) by mouth every 6 (six) hours as needed for severe pain. 20 tablet 0   No current facility-administered medications for this visit.    Facility-Administered Medications Ordered in Other Visits  Medication Dose Route Frequency Provider Last Rate Last Dose   sodium chloride flush (NS) 0.9 % injection 10 mL  10 mL  Intracatheter PRN Curt Bears, MD   10 mL at 06/28/18 1501    SURGICAL HISTORY:  Past Surgical History:  Procedure Laterality Date   IR GUIDED DRAIN W CATHETER PLACEMENT  11/15/2018   IR THORACENTESIS ASP PLEURAL SPACE W/IMG GUIDE  09/05/2018   IR THORACENTESIS ASP PLEURAL SPACE W/IMG GUIDE  10/11/2018   IR THORACENTESIS ASP PLEURAL SPACE W/IMG GUIDE  10/30/2018   PORTACATH PLACEMENT Left 02/21/2018   Procedure: INSERTION PORT-A-CATH;  Surgeon: Grace Isaac, MD;  Location: [facility];  Service: Thoracic;  Laterality: Left;    REVIEW OF SYSTEMS:  Constitutional: positive for fatigue Eyes: negative Ears, nose, mouth, throat, and face: negative Respiratory: positive for dyspnea on exertion Cardiovascular: negative Gastrointestinal: negative Genitourinary:negative Integument/breast: negative Hematologic/lymphatic: negative Musculoskeletal:negative Neurological: negative Behavioral/Psych: negative Endocrine: negative Allergic/Immunologic: negative   PHYSICAL EXAMINATION: General appearance: alert, cooperative, fatigued and no distress Head: Normocephalic, without obvious abnormality, atraumatic Neck: no adenopathy, no JVD, supple, symmetrical, trachea midline and thyroid not enlarged, symmetric, no tenderness/mass/nodules Lymph nodes: Cervical, supraclavicular, and axillary nodes normal. Resp: clear to auscultation bilaterally Back: symmetric, no curvature. ROM normal. No CVA tenderness. Cardio: regular rate and rhythm, S1, S2 normal, no murmur, click, rub or gallop GI: soft, non-tender; bowel sounds normal; no masses,  no organomegaly Extremities: extremities normal, atraumatic, no cyanosis or edema Neurologic: Alert and oriented X 3, normal strength and tone. Normal symmetric reflexes. Normal coordination and gait  ECOG PERFORMANCE STATUS: 1 - Symptomatic but completely ambulatory  Blood pressure 135/72, pulse 78, temperature 97.8 F (36.6 C), temperature source Oral,  resp. rate 18, height 5\' 9"  (1.753 m), weight 271 lb 9.6 oz (123.2 kg).  LABORATORY DATA: Lab Results  Component Value Date   WBC 3.2 (L) 01/08/2019   HGB 9.4 (L) 01/08/2019   HCT 29.2 (L) 01/08/2019   MCV 89.0 01/08/2019   PLT 184 01/08/2019      Chemistry      Component Value Date/Time   NA 136 01/08/2019 0913   K 4.2 01/08/2019 0913   CL 102 01/08/2019 0913   CO2 25 01/08/2019 0913   BUN 32 (H) 01/08/2019 0913   CREATININE 1.59 (H) 01/08/2019 0913      Component Value Date/Time   CALCIUM 8.8 (L) 01/08/2019 0913   ALKPHOS 43 01/08/2019 0913   AST 17 01/08/2019 0913   ALT 12 01/08/2019 0913   BILITOT 0.7 01/08/2019 0913       RADIOGRAPHIC STUDIES: Dg Chest 1 View  Result Date: 01/03/2019 CLINICAL DATA:  Increasing sob in pt with hx of pleural effusions with pluerix drainage zero in past 2 days, EXAM: CHEST  1 VIEW COMPARISON:  10/30/2018 FINDINGS: Right PleurX catheter loops at the lung base. Slight increase in moderate right pleural effusion. Right perihilar opacity stable. Left lung clear. Heart size normal. Stable left subclavian  power injectable port catheter. No pneumothorax. Visualized bones unremarkable. IMPRESSION: Slight increase in moderate right pleural effusion. Electronically Signed   By: Lucrezia Europe M.D.   On: 01/03/2019 17:28   Dg Chest 2 View  Result Date: 01/06/2019 CLINICAL DATA:  Pleural effusion with PleurX drainage catheter EXAM: CHEST - 2 VIEW COMPARISON:  January 03, 2019 chest radiograph and chest CT Dec 08, 2018 FINDINGS: PleurX catheter tip is in the lateral right base, stable. There is a persistent partially loculated right pleural effusion with atelectasis in the right base. There is perihilar opacity on the right, stable. Right middle lobe consolidation/collapse is grossly stable, better seen on recent CT. Left lung is clear. Heart is upper normal in size with pulmonary vascularity normal. There is aortic atherosclerosis. Port-A-Cath tip is in the  superior vena cava. There is degenerative change in the thoracic spine. IMPRESSION: Stable PleurX catheter positioning. Loculated right pleural effusion with right base atelectasis appears essentially stable. Right middle lobe consolidation/collapse is better seen on the lateral view and is felt to be unchanged from recent CT. Right perihilar opacity consistent with consolidation is grossly stable. No new opacity evident. Left lung clear. Stable cardiac silhouette. Aortic Atherosclerosis (ICD10-I70.0). Electronically Signed   By: Lowella Grip III M.D.   On: 01/06/2019 10:12   Ct Chest Wo Contrast  Result Date: 01/16/2019 CLINICAL DATA:  Follow-up pleural effusion. EXAM: CT CHEST WITHOUT CONTRAST TECHNIQUE: Multidetector CT imaging of the chest was performed following the standard protocol without IV contrast. COMPARISON:  12/08/2018 FINDINGS: Cardiovascular: Normal heart size. No pericardial effusion. Injectable port in stable position. Calcific atherosclerotic disease of the coronary arteries and aorta. Stable fusiform ascending aortic aneurysm measuring 4.4 cm. Mediastinum/Nodes: Borderline enlarged right supraclavicular lymph node measures 10 mm in short axis. No discrete hilar lymphadenopathy. Lungs/Pleura: Further progression of loculations in stable in size right pleural effusion with chest tube in place. Stable area of masslike architectural distortion within the perihilar right lung, which extends to the right upper lobe, thought to represent posttreatment changes. Several new ground-glass subpleural pulmonary nodules in the right lower lobe, the largest measuring 2.2 cm. The left lung is clear. Upper Abdomen: No acute abnormality. Musculoskeletal: No chest wall mass or suspicious bone lesions identified. Multilevel spondylosis of the thoracic spine. IMPRESSION: 1. Further progression of loculations in moderate in size right pleural effusion with chest tube in place. 2. Stable masslike architectural  distortion in the perihilar right lung, which extends to the right upper lobe, considered to represent posttreatment changes. 3. Several new ground-glass subpleural pulmonary nodules in the right lower lobe, the largest measuring 2.2 cm. These have indeterminate appearance and may represent infectious/inflammatory nodules or potentially, but less likely, progression of disease. 4. Borderline enlarged right supraclavicular lymph node. 5. Stable fusiform ascending aortic aneurysm measuring 4.4 cm. Recommend annual imaging followup by CTA or MRA. This recommendation follows 2010 ACCF/AHA/AATS/ACR/ASA/SCA/SCAI/SIR/STS/SVM Guidelines for the Diagnosis and Management of Patients with Thoracic Aortic Disease. 2010; 121: N462-V035. Aortic Atherosclerosis (ICD10-I70.0). Aortic aneurysm NOS (ICD10-I71.9). Electronically Signed   By: Fidela Salisbury M.D.   On: 01/16/2019 11:20   Ir US Chest  Result Date: 01/10/2019 CLINICAL DATA:  Patient with history of right lung cancer and recurrent right pleural effusion s/p right Pleurx placement 11/15/18. Patient presented to the ED on 01/06/19 due to complaints dyspnea and little to no output from Pleurx for previous 2 weeks - imaging during this visit showed a well-positioned Pleurx catheter and tPA dwell was performed. After tPA dwell  a total of 950 cc of pleural fluid was drained and patient's symptoms resolved. He presents today for evaluation of his Pleurx catheter due to decreased output and rate of flow. He reports that he typically drains approximately 1 liter of fluid every other day however the last time he successfully used the Pleurx was 2 days ago when 700 cc was drained. He reports that the fluid came out very slow and sporadically and took approximately 45 minutes to drain. He denies any dyspnea, chest pain, cough or leakage of fluids from insertion site. EXAM: Right Pleurx dressed appropriately, insertion site clean, dry and intact. Connected Pleurx to drainage  system and ultimately drained 200 cc of golden pleural fluid over the course of approximately 1 hour. A limited chest ultrasound was performed which showed a large amount of residual pleural fluid on the right. The Pleurx catheter was visualized in the pleural space. FINDINGS: Patient with decreased output from right Pleurx s/p tPA dwell 01/06/19 in the ED which initially improved output, however on exam today only 200 cc of pleural fluid was removed with large pleural effusion per ultrasound remaining. IMPRESSION: Patient discussed today with Dr. Vernard Gambles who recommends obtaining an outpatient non-contrast CT chest to evaluate tubing position and scheduling patient for Pleurx revision. Patient is aware of and agrees to plan, he understands to call our office or return to the ED if he experiences worsening symptoms. Read by Candiss Norse, PA-C Electronically Signed   By: Lucrezia Europe M.D.   On: 01/10/2019 16:35    ASSESSMENT AND PLAN: This is a very pleasant 73 years old white male with a stage IIb/IIIa non-small cell lung cancer, squamous cell carcinoma diagnosed in July 2019. The patient is currently undergoing a course of concurrent chemoradiation with weekly carboplatin and paclitaxel status post 5 cycles.  He had partial response after the initial induction treatment. The patient was started on treatment with consolidation Imfinzi status post 15 cycles.  The patient continues to tolerate his treatment well. He had repeat CT scan of the chest performed recently.  I personally and independently reviewed the scan images and discussed the results with the patient today. His scan showed a stable disease but there was some inflammatory changes in the right lower lobe. I recommended for the patient to continue his current treatment with Imfinzi and he will proceed with cycle #16 today. For the right pleural effusion, he is scheduled to see interventional audiology tomorrow for evaluation of the right Pleurx  catheter that is currently dysfunctional. I will see him back for follow-up visit in 2 weeks for evaluation before the next cycle of his treatment. He was advised to call immediately if he has any concerning symptoms in the interval. The patient voices understanding of current disease status and treatment options and is in agreement with the current care plan. All questions were answered. The patient knows to call the clinic with any problems, questions or concerns. We can certainly see the patient much sooner if necessary.  Disclaimer: This note was dictated with voice recognition software. Similar sounding words can inadvertently be transcribed and may not be corrected upon review.

## 2019-01-23 ENCOUNTER — Encounter (HOSPITAL_COMMUNITY): Payer: Self-pay | Admitting: Student

## 2019-01-23 ENCOUNTER — Telehealth: Payer: Self-pay | Admitting: Internal Medicine

## 2019-01-23 ENCOUNTER — Ambulatory Visit (HOSPITAL_COMMUNITY)
Admission: RE | Admit: 2019-01-23 | Discharge: 2019-01-23 | Disposition: A | Discharge status: Home / Self Care | Payer: Medicare Other | Source: Ambulatory Visit | Attending: Interventional Radiology | Admitting: Interventional Radiology

## 2019-01-23 ENCOUNTER — Other Ambulatory Visit (HOSPITAL_COMMUNITY): Payer: Self-pay | Admitting: Interventional Radiology

## 2019-01-23 ENCOUNTER — Encounter (HOSPITAL_COMMUNITY): Payer: Self-pay

## 2019-01-23 ENCOUNTER — Other Ambulatory Visit (HOSPITAL_COMMUNITY)
Admission: RE | Admit: 2019-01-23 | Discharge: 2019-01-23 | Disposition: A | Discharge status: Home / Self Care | Payer: Medicare Other | Source: Ambulatory Visit | Attending: Internal Medicine | Admitting: Internal Medicine

## 2019-01-23 DIAGNOSIS — J90 Pleural effusion, not elsewhere classified: Secondary | ICD-10-CM | POA: Diagnosis present

## 2019-01-23 DIAGNOSIS — R0602 Shortness of breath: Secondary | ICD-10-CM | POA: Insufficient documentation

## 2019-01-23 DIAGNOSIS — Z85118 Personal history of other malignant neoplasm of bronchus and lung: Secondary | ICD-10-CM | POA: Diagnosis not present

## 2019-01-23 DIAGNOSIS — Z1159 Encounter for screening for other viral diseases: Secondary | ICD-10-CM | POA: Insufficient documentation

## 2019-01-23 HISTORY — PX: IR INSTILL VIA CHEST TUBE AGENT FOR FIBRINOLYSIS INI DAY: IMG5400

## 2019-01-23 LAB — SARS CORONAVIRUS 2 BY RT PCR (HOSPITAL ORDER, PERFORMED IN ~~LOC~~ HOSPITAL LAB): SARS Coronavirus 2: NEGATIVE

## 2019-01-23 MED ORDER — ALTEPLASE 2 MG IJ SOLR
INTRAMUSCULAR | Status: AC
  Administered 2019-01-23: 15:00:00 6 mg
  Filled 2019-01-23: qty 6

## 2019-01-23 NOTE — Telephone Encounter (Signed)
Scheduled appt per 7/13 los - pt to get an updated schedule next visit

## 2019-01-23 NOTE — Progress Notes (Signed)
IR.  Patient with history of lung cancer with recurrent malignant right pleural effusion s/p right PleurX catheter placement 11/15/2018 by Dr. Annamaria Boots. He [facility] 01/06/2019 due to poor output from PleurX/increased dyspnea- subsequently underwent tPA dwell same day which initially improved output. He then presented to [facility] IR 01/10/2019 for PleurX evaluation with same complaints (decreased output/increased symptoms)- approximately 200 cc pleural fluid removed. Case discussed with Dr. Vernard Gambles who recommended CT chest and possible PleurX revision. CT chest 01/16/2019 revealed PleurX in good position. Patient presented to [facility] IR today for again PleurX evaluation with same complaints- decreased output/increased dyspnea.  Patient evaluated today. Right PleurX catheter with poor output and large pleural effusion on ultrasound. tPA dwell (6 mL tPA in 30 cc NS) into right PleurX catheter x 2 hours. Following dwell, right PleurX was connected to drainage system and 900 mL of dark red fluid was removed. Patient tolerated procedure well, no complications. Please see imaging section of Epic for full dictation.  Patient advised to call our office or return to ED if symptoms return. Please call IR with questions/concerns.   Bea Graff Lucelia Lacey, PA-C 01/23/2019, 5:20 PM

## 2019-01-29 ENCOUNTER — Telehealth: Payer: Self-pay | Admitting: Student

## 2019-01-29 ENCOUNTER — Other Ambulatory Visit (HOSPITAL_COMMUNITY): Payer: Self-pay | Admitting: Interventional Radiology

## 2019-01-29 ENCOUNTER — Other Ambulatory Visit: Payer: Self-pay | Admitting: Student

## 2019-01-29 DIAGNOSIS — J90 Pleural effusion, not elsewhere classified: Secondary | ICD-10-CM

## 2019-01-29 NOTE — Telephone Encounter (Signed)
Patient with difficulty using PleurX at home.  Has been to Radiology several times in the past month for troubleshooting and re-evaluation.  Wife called today to notify of poor fluid drainage at home-- 450 mL drained over 3 hrs last PM.  Patient does have improvement in volume removed and symptoms after tPA instillation, but quickly becomes short of breath with presumed reaccumulation and difficulty draining in the following days.   Review of available imaging shows patient with increased loculations on recent CT imaging. Plan discussed with Dr. Earleen Newport. Proceed with possible PleurX removal, replacement, thoracentesis on Wednesday.   Discussed with wife.  Instructed not to drain at least 24 hours prior to placement.  She verbalizes understanding of care plan.   Brynda Greathouse, MS RD PA-C

## 2019-01-30 ENCOUNTER — Encounter (HOSPITAL_BASED_OUTPATIENT_CLINIC_OR_DEPARTMENT_OTHER): Payer: Self-pay

## 2019-01-30 ENCOUNTER — Other Ambulatory Visit (HOSPITAL_COMMUNITY)
Admission: RE | Admit: 2019-01-30 | Discharge: 2019-01-30 | Disposition: A | Discharge status: Home / Self Care | Payer: Medicare Other | Source: Ambulatory Visit | Attending: Internal Medicine | Admitting: Internal Medicine

## 2019-01-30 ENCOUNTER — Emergency Department (HOSPITAL_BASED_OUTPATIENT_CLINIC_OR_DEPARTMENT_OTHER)
Admission: EM | Admit: 2019-01-30 | Discharge: 2019-01-30 | Disposition: A | Discharge status: Home / Self Care | Payer: Medicare Other | Attending: Emergency Medicine | Admitting: Emergency Medicine

## 2019-01-30 ENCOUNTER — Other Ambulatory Visit: Payer: Self-pay

## 2019-01-30 ENCOUNTER — Emergency Department (HOSPITAL_BASED_OUTPATIENT_CLINIC_OR_DEPARTMENT_OTHER): Payer: Medicare Other

## 2019-01-30 ENCOUNTER — Other Ambulatory Visit: Payer: Self-pay | Admitting: Radiology

## 2019-01-30 DIAGNOSIS — Y929 Unspecified place or not applicable: Secondary | ICD-10-CM | POA: Diagnosis not present

## 2019-01-30 DIAGNOSIS — I2510 Atherosclerotic heart disease of native coronary artery without angina pectoris: Secondary | ICD-10-CM | POA: Insufficient documentation

## 2019-01-30 DIAGNOSIS — I129 Hypertensive chronic kidney disease with stage 1 through stage 4 chronic kidney disease, or unspecified chronic kidney disease: Secondary | ICD-10-CM | POA: Insufficient documentation

## 2019-01-30 DIAGNOSIS — J449 Chronic obstructive pulmonary disease, unspecified: Secondary | ICD-10-CM | POA: Insufficient documentation

## 2019-01-30 DIAGNOSIS — Z9049 Acquired absence of other specified parts of digestive tract: Secondary | ICD-10-CM | POA: Insufficient documentation

## 2019-01-30 DIAGNOSIS — S42202A Unspecified fracture of upper end of left humerus, initial encounter for closed fracture: Secondary | ICD-10-CM | POA: Diagnosis not present

## 2019-01-30 DIAGNOSIS — Z87891 Personal history of nicotine dependence: Secondary | ICD-10-CM | POA: Insufficient documentation

## 2019-01-30 DIAGNOSIS — N183 Chronic kidney disease, stage 3 (moderate): Secondary | ICD-10-CM | POA: Insufficient documentation

## 2019-01-30 DIAGNOSIS — Z79899 Other long term (current) drug therapy: Secondary | ICD-10-CM | POA: Diagnosis not present

## 2019-01-30 DIAGNOSIS — Y9301 Activity, walking, marching and hiking: Secondary | ICD-10-CM | POA: Insufficient documentation

## 2019-01-30 DIAGNOSIS — Z85828 Personal history of other malignant neoplasm of skin: Secondary | ICD-10-CM | POA: Diagnosis not present

## 2019-01-30 DIAGNOSIS — Y998 Other external cause status: Secondary | ICD-10-CM | POA: Diagnosis not present

## 2019-01-30 DIAGNOSIS — S4992XA Unspecified injury of left shoulder and upper arm, initial encounter: Secondary | ICD-10-CM | POA: Diagnosis present

## 2019-01-30 DIAGNOSIS — W101XXA Fall (on)(from) sidewalk curb, initial encounter: Secondary | ICD-10-CM | POA: Diagnosis not present

## 2019-01-30 DIAGNOSIS — E1122 Type 2 diabetes mellitus with diabetic chronic kidney disease: Secondary | ICD-10-CM | POA: Diagnosis not present

## 2019-01-30 DIAGNOSIS — Z20828 Contact with and (suspected) exposure to other viral communicable diseases: Secondary | ICD-10-CM | POA: Insufficient documentation

## 2019-01-30 DIAGNOSIS — Z794 Long term (current) use of insulin: Secondary | ICD-10-CM | POA: Insufficient documentation

## 2019-01-30 LAB — SARS CORONAVIRUS 2 (TAT 6-24 HRS): SARS Coronavirus 2: NEGATIVE

## 2019-01-30 MED ORDER — OXYCODONE-ACETAMINOPHEN 5-325 MG PO TABS
1.0000 | ORAL_TABLET | Freq: Once | ORAL | Status: AC
  Administered 2019-01-30: 1 via ORAL

## 2019-01-30 MED ORDER — OXYCODONE-ACETAMINOPHEN 5-325 MG PO TABS
ORAL_TABLET | ORAL | Status: AC
  Filled 2019-01-30: qty 1

## 2019-01-30 MED ORDER — OXYCODONE-ACETAMINOPHEN 5-325 MG PO TABS: 1.0000 | ORAL_TABLET | Freq: Four times a day (QID) | ORAL | 0 refills | Status: DC | PRN

## 2019-01-30 NOTE — ED Triage Notes (Signed)
States he tripped/fell ~30 min PTA-pain to left shoulder-NAD-to triage in w/c with EMS sling in place-pt refused transport by EMS

## 2019-01-30 NOTE — Discharge Instructions (Signed)
Continue taking home medications as prescribed.  Use the sling for pain control.  Take Tylenol and ibuprofen as needed for mild to moderate pain.  Use percocet as needed for severe breakthrough pain.  Have caution, this may make you tired or groggy.  Do not drive or behavior machinery when taking this medicine.  Take this medication instead of the tramadol. Use ice to help with pain and swelling. Called with Dr. Lorin Mercy' office to set up an appointment for next week. Return to the emergency room if you develop severe worsening pain, numbness, any new, worsening, concerning symptoms.

## 2019-01-30 NOTE — ED Provider Notes (Signed)
[facility] EMERGENCY DEPARTMENT Provider Note   CSN: [id number] Arrival date & time: 01/30/19  1309    History   Chief Complaint Chief Complaint  Patient presents with  . Fall    HPI JORI THRALL is a 73 y.o. male presenting for evaluation of left shoulder pain after fall.  Patient states just prior to arrival he was walking when he got tripped up over a curb, and fell landing on his left shoulder.  He denies hitting his head or loss of consciousness.  He is not on blood thinners.  He denies pain in the neck or the back.  He is having pain only in the shoulder.  He denies dizziness or weakness prior to the fall.  He took tramadol for pain without improvement.  Pain is mild at rest, severe with any attempted movement of his arm.  He denies numbness or tingling in his hand.  He is currently being treated for lung cancer with immunosuppression, last treatment was 6 days ago.  He does not have an orthopedic doctor.  He denies pain or injury elsewhere.     HPI  Past Medical History:  Diagnosis Date  . Aortic atherosclerosis ([place]) 07/18/2018  . Cancer ([place])   . Cellulitis of right lower extremity   . Chronic kidney disease   . Diabetes mellitus without complication ([place])   . Dyslipidemia   . Hypertension   . Thoracic aortic aneurysm without rupture ([place]) 07/18/2018    Patient Active Problem List   Diagnosis Date Noted  . Hypokalemia 09/18/2018  . Dyspnea 08/29/2018  . COPD (chronic obstructive pulmonary disease) ([place]) 08/03/2018  . Hemoptysis 07/18/2018  . CKD (chronic kidney disease) stage 3, GFR 30-59 ml/min ([facility]) 07/18/2018  . Lobar pneumonia ([place]) 07/18/2018  . Thoracic aortic aneurysm without rupture ([place]) 07/18/2018  . Aortic atherosclerosis ([place]) 07/18/2018  . Encounter for antineoplastic immunotherapy 05/17/2018  . Port-A-Cath in place 03/27/2018  . Encounter for antineoplastic chemotherapy 02/16/2018  . 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 Goals  of care, counseling/discussion 02/07/2018  . ANGIOKERATOMA, BLEEDING 10/06/2006  . DM2 (diabetes mellitus, type 2) ([place]) 10/06/2006  . HYPERLIPIDEMIA 10/06/2006  . HYPERTENSION, BENIGN 10/06/2006  . LATERAL MENISCUS TEAR, RIGHT 10/06/2006  . CHOLECYSTECTOMY, HX OF 10/06/2006    Past Surgical History:  Procedure Laterality Date  . IR GUIDED DRAIN W CATHETER PLACEMENT  11/15/2018  . IR INSTILL VIA CHEST TUBE AGENT FOR FIBRINOLYSIS INI DAY  01/23/2019  . IR THORACENTESIS ASP PLEURAL SPACE W/IMG GUIDE  09/05/2018  . IR THORACENTESIS ASP PLEURAL SPACE W/IMG GUIDE  10/11/2018  . IR THORACENTESIS ASP PLEURAL SPACE W/IMG GUIDE  10/30/2018  . PORTACATH PLACEMENT Left 02/21/2018   Procedure: INSERTION PORT-A-CATH;  Surgeon: Grace Isaac, MD;  Location: [facility] OR;  Service: Thoracic;  Laterality: Left;        Home Medications    Prior to Admission medications   Medication Sig Start Date End Date Taking? Authorizing Provider  albuterol (PROVENTIL HFA;VENTOLIN HFA) 108 (90 Base) MCG/ACT inhaler Inhale 2 puffs into the lungs every 4 (four) hours as needed for wheezing or shortness of breath. 08/03/18   Byrum, Rose Fillers, MD  carvedilol (COREG) 6.25 MG tablet Take 6.25 mg by mouth 2 (two) times daily with a meal.  07/19/16   [provider]  Cinnamon Bark POWD Take 1,000 mg by mouth 2 (two) times daily.     [provider]  cloNIDine (CATAPRES) 0.1 MG tablet Take 0.1  mg by mouth 2 (two) times daily.  08/31/16   [provider]  fenofibrate 160 MG tablet Take 160 mg by mouth daily.  11/13/17   [provider]  hydrochlorothiazide (HYDRODIURIL) 25 MG tablet Take 25 mg by mouth daily.  02/06/18   [provider]  Insulin Detemir (LEVEMIR FLEXTOUCH) 100 UNIT/ML Pen Inject 10 Units into the skin daily.  07/18/15   [provider]  Insulin Pen Needle (FIFTY50 PEN NEEDLES) 31G X 8 MM MISC USE AS DIRECTED 06/30/15   [provider]  lidocaine-prilocaine  (EMLA) cream Apply 1 application topically as needed. Patient taking differently: Apply 1 application topically as needed (port).  02/16/18   Curt Bears, MD  losartan (COZAAR) 50 MG tablet Take 50 mg by mouth daily.  01/08/18   [provider]  magnesium oxide (MAG-OX) 400 MG tablet Take 400 mg by mouth daily.    [provider]  metFORMIN (GLUCOPHAGE) 1000 MG tablet Take 1,000 mg by mouth 2 (two) times daily with a meal.  11/13/17   [provider]  Multiple Vitamin (MULTI-VITAMINS) TABS Take 1 tablet by mouth daily.     [provider]  Omega-3 1000 MG CAPS Take 1,200 mg by mouth 2 (two) times daily.     [provider]  oxyCODONE-acetaminophen (PERCOCET/ROXICET) 5-325 MG tablet Take 1 tablet by mouth every 6 (six) hours as needed for severe pain. 01/30/19   Emanuele Mcwhirter, PA-C  pioglitazone (ACTOS) 45 MG tablet Take 45 mg by mouth daily.  02/06/18   [provider]  potassium chloride SA (K-DUR,KLOR-CON) 20 MEQ tablet Take 1 tablet (20 mEq total) by mouth daily. 09/18/18   Heilingoetter, Cassandra L, PA-C  pravastatin (PRAVACHOL) 40 MG tablet Take 40 mg by mouth daily.  01/08/18   [provider]  prochlorperazine (COMPAZINE) 10 MG tablet TAKE 1 TABLET BY MOUTH EVERY 6 HOURS AS NEEDED FOR NAUSEA OR VOMITING Patient not taking: Reported on 01/22/2019 02/21/18   Curt Bears, MD  traMADol (ULTRAM) 50 MG tablet Take 1 tablet (50 mg total) by mouth every 6 (six) hours as needed for severe pain. Patient not taking: Reported on 01/22/2019 06/23/18   Curt Bears, MD    Family History Family History  Problem Relation Age of Onset  . Breast cancer Mother   . CAD Father     Social History Social History   Tobacco Use  . Smoking status: Former Smoker    Packs/day: 2.00    Years: 35.00    Pack years: 70.00    Types: Cigarettes    Quit date: 07/12/1996    Years since quitting: 22.5  . Smokeless tobacco: Never Used  Substance  Use Topics  . Alcohol use: Yes    Comment: occasionally  . Drug use: Never     Allergies   Lisinopril   Review of Systems Review of Systems  Musculoskeletal: Positive for arthralgias.  Neurological: Negative for numbness.  Hematological: Does not bruise/bleed easily.  All other systems reviewed and are negative.    Physical Exam Updated Vital Signs BP (!) 110/59 (BP Location: Right Arm)   Pulse 77   Temp 98.1 F (36.7 C) (Oral)   Ht 5\' 9"  (1.753 m)   Wt 122.5 kg   SpO2 97%   BMI 39.87 kg/m   Physical Exam Vitals signs and nursing note reviewed.  Constitutional:      General: He is not in acute distress.    Appearance: He is well-developed.  HENT:     Head: Normocephalic and atraumatic.  Neck:     Musculoskeletal: Normal range of motion.     Comments: No tenderness palpation midline C-spine.  No step-offs or deformities.  Moving head without signs of pain. Cardiovascular:     Rate and Rhythm: Normal rate and regular rhythm.     Pulses: Normal pulses.  Pulmonary:     Effort: Pulmonary effort is normal.     Breath sounds: Normal breath sounds.     Comments: Speaking in full sentences.  Clear lung sounds in all fields.  No tenderness palpation of the chest wall. Chest:     Chest wall: No tenderness.  Abdominal:     General: There is no distension.     Palpations: There is no mass.     Tenderness: There is no abdominal tenderness. There is no guarding or rebound.     Comments: No tenderness palpation the abdomen.  Soft without rigidity, guarding, distention.  Musculoskeletal: Normal range of motion.        General: Tenderness present.     Comments: Tenderness palpation of the left shoulder over the humeral head and AC joint.  No tenderness palpation of the distal humerus, elbow, forearm, or wrist.  Radial pulses intact.  Grip strength intact bilaterally.  No tenderness palpation of the clavicle or posterior shoulder.  No obvious deformity/dislocation.  Skin:     General: Skin is warm.     Capillary Refill: Capillary refill takes less than 2 seconds.     Findings: No rash.  Neurological:     Mental Status: He is alert and oriented to person, place, and time.      ED Treatments / Results  Labs (all labs ordered are listed, but only abnormal results are displayed) Labs Reviewed - No data to display  EKG None  Radiology Dg Shoulder Left  Result Date: 01/30/2019 CLINICAL DATA:  73 year old who fell and injured the LEFT shoulder earlier today. Patient currently unable to abduct the arm. Initial encounter. EXAM: LEFT SHOULDER - 2+ VIEW COMPARISON:  None. FINDINGS: Acute impacted fracture involving the humeral head and neck. An apparent free fragment is present which includes the greater tuberosity. Glenohumeral joint anatomically aligned with well-preserved joint space. Acromioclavicular joint anatomically aligned with only mild degenerative changes. Port-A-Cath port overlies the lower shoulder joint on the AP image. IMPRESSION: 1. Acute traumatic impacted fracture involving the humeral head and neck. 2. Apparent free fragment which includes the greater tuberosity. Electronically Signed   By: Evangeline Dakin M.D.   On: 01/30/2019 14:00    Procedures Procedures (including critical care time)  Medications Ordered in ED Medications  oxyCODONE-acetaminophen (PERCOCET/ROXICET) 5-325 MG per tablet (has no administration in time range)  oxyCODONE-acetaminophen (PERCOCET/ROXICET) 5-325 MG per tablet 1 tablet (1 tablet Oral Given 01/30/19 1416)     Initial Impression / Assessment and Plan / ED Course  I have reviewed the triage vital signs and the nursing notes.  Pertinent labs & imaging results that were available during my care of the patient were reviewed by me and considered in my medical decision making (see chart for details).        Patient presenting for evaluation of left shoulder pain after fall.  Physical exam reassuring, he is  neurovascularly intact.  Will obtain x-rays for further evaluation.  X-rays viewed interpreted by me, shows fracture of the humeral head.  Per radiology read, there is an impacted fracture with concern for free-floating fragment of the humeral  head.  Will discuss with orthopedics for timeline for follow-up.  Discussed with Dr. Lorin Mercy who reviewed the images.  Recommends placing patient in a splint, pain control, and follow-up in the office in 1 week.  Case discussed with attending, Dr. tegeler agrees to plan.  PMO checked, pt without concerning controlled substance Rx. As he is on tramadol, will given short course of percocet to take instead.  At this time, patient appears safe for discharge.  Return precautions given.  Patient states he understands and agrees to plan.  Final Clinical Impressions(s) / ED Diagnoses   Final diagnoses:  None    ED Discharge Orders         Ordered    oxyCODONE-acetaminophen (PERCOCET/ROXICET) 5-325 MG tablet  Every 6 hours PRN     01/30/19 [facility], Charde Macfarlane, PA-C 01/30/19 1554    Tegeler, Gwenyth Allegra, MD 01/30/19 [phone]

## 2019-01-31 ENCOUNTER — Ambulatory Visit (HOSPITAL_COMMUNITY)
Admission: RE | Admit: 2019-01-31 | Discharge: 2019-01-31 | Disposition: A | Discharge status: Home / Self Care | Payer: Medicare Other | Source: Ambulatory Visit | Attending: Interventional Radiology | Admitting: Interventional Radiology

## 2019-01-31 ENCOUNTER — Ambulatory Visit (HOSPITAL_COMMUNITY): Payer: Medicare Other

## 2019-01-31 ENCOUNTER — Other Ambulatory Visit (HOSPITAL_COMMUNITY): Payer: Self-pay | Admitting: Interventional Radiology

## 2019-01-31 ENCOUNTER — Other Ambulatory Visit: Payer: Self-pay

## 2019-01-31 ENCOUNTER — Ambulatory Visit (HOSPITAL_COMMUNITY)
Admission: RE | Admit: 2019-01-31 | Discharge: 2019-01-31 | Disposition: A | Discharge status: Home / Self Care | Payer: Medicare Other | Source: Ambulatory Visit | Attending: *Deleted | Admitting: *Deleted

## 2019-01-31 ENCOUNTER — Encounter (HOSPITAL_COMMUNITY): Payer: Self-pay

## 2019-01-31 DIAGNOSIS — J910 Malignant pleural effusion: Secondary | ICD-10-CM | POA: Insufficient documentation

## 2019-01-31 DIAGNOSIS — Z87891 Personal history of nicotine dependence: Secondary | ICD-10-CM | POA: Insufficient documentation

## 2019-01-31 DIAGNOSIS — Z888 Allergy status to other drugs, medicaments and biological substances status: Secondary | ICD-10-CM | POA: Insufficient documentation

## 2019-01-31 DIAGNOSIS — Z794 Long term (current) use of insulin: Secondary | ICD-10-CM | POA: Diagnosis not present

## 2019-01-31 DIAGNOSIS — J90 Pleural effusion, not elsewhere classified: Secondary | ICD-10-CM

## 2019-01-31 DIAGNOSIS — C3492 Malignant neoplasm of unspecified part of left bronchus or lung: Secondary | ICD-10-CM | POA: Insufficient documentation

## 2019-01-31 DIAGNOSIS — E785 Hyperlipidemia, unspecified: Secondary | ICD-10-CM | POA: Insufficient documentation

## 2019-01-31 DIAGNOSIS — E1122 Type 2 diabetes mellitus with diabetic chronic kidney disease: Secondary | ICD-10-CM | POA: Diagnosis not present

## 2019-01-31 DIAGNOSIS — I129 Hypertensive chronic kidney disease with stage 1 through stage 4 chronic kidney disease, or unspecified chronic kidney disease: Secondary | ICD-10-CM | POA: Insufficient documentation

## 2019-01-31 DIAGNOSIS — N189 Chronic kidney disease, unspecified: Secondary | ICD-10-CM | POA: Diagnosis not present

## 2019-01-31 DIAGNOSIS — Z79899 Other long term (current) drug therapy: Secondary | ICD-10-CM | POA: Insufficient documentation

## 2019-01-31 DIAGNOSIS — Z8249 Family history of ischemic heart disease and other diseases of the circulatory system: Secondary | ICD-10-CM | POA: Insufficient documentation

## 2019-01-31 DIAGNOSIS — I712 Thoracic aortic aneurysm, without rupture: Secondary | ICD-10-CM | POA: Insufficient documentation

## 2019-01-31 DIAGNOSIS — I700 Atherosclerosis of aorta: Secondary | ICD-10-CM | POA: Diagnosis not present

## 2019-01-31 HISTORY — PX: IR REMOVAL OF PLURAL CATH W/CUFF: IMG5346

## 2019-01-31 HISTORY — PX: IR GUIDED DRAIN W CATHETER PLACEMENT: IMG719

## 2019-01-31 LAB — CBC
HCT: 28.7 % — ABNORMAL LOW (ref 39.0–52.0)
Hemoglobin: 8.8 g/dL — ABNORMAL LOW (ref 13.0–17.0)
MCH: 28.8 pg (ref 26.0–34.0)
MCHC: 30.7 g/dL (ref 30.0–36.0)
MCV: 93.8 fL (ref 80.0–100.0)
Platelets: 202 10*3/uL (ref 150–400)
RBC: 3.06 MIL/uL — ABNORMAL LOW (ref 4.22–5.81)
RDW: 19.1 % — ABNORMAL HIGH (ref 11.5–15.5)
WBC: 3.1 10*3/uL — ABNORMAL LOW (ref 4.0–10.5)
nRBC: 0 % (ref 0.0–0.2)

## 2019-01-31 LAB — GLUCOSE, CAPILLARY: Glucose-Capillary: 109 mg/dL — ABNORMAL HIGH (ref 70–99)

## 2019-01-31 LAB — APTT: aPTT: 37 seconds — ABNORMAL HIGH (ref 24–36)

## 2019-01-31 LAB — PROTIME-INR
INR: 1.3 — ABNORMAL HIGH (ref 0.8–1.2)
Prothrombin Time: 15.7 seconds — ABNORMAL HIGH (ref 11.4–15.2)

## 2019-01-31 MED ORDER — LIDOCAINE HCL (PF) 1 % IJ SOLN
INTRAMUSCULAR | Status: AC | PRN
  Administered 2019-01-31: 20 mL

## 2019-01-31 MED ORDER — LIDOCAINE HCL 1 % IJ SOLN
INTRAMUSCULAR | Status: AC
  Filled 2019-01-31: qty 20

## 2019-01-31 MED ORDER — SODIUM CHLORIDE 0.9 % IV SOLN: INTRAVENOUS | Status: DC

## 2019-01-31 MED ORDER — CHLORHEXIDINE GLUCONATE 4 % EX LIQD
CUTANEOUS | Status: AC
  Filled 2019-01-31: qty 15

## 2019-01-31 MED ORDER — FENTANYL CITRATE (PF) 100 MCG/2ML IJ SOLN
INTRAMUSCULAR | Status: AC
  Filled 2019-01-31: qty 2

## 2019-01-31 MED ORDER — FENTANYL CITRATE (PF) 100 MCG/2ML IJ SOLN
INTRAMUSCULAR | Status: AC | PRN
  Administered 2019-01-31: 50 ug via INTRAVENOUS

## 2019-01-31 MED ORDER — CEFAZOLIN SODIUM-DEXTROSE 2-4 GM/100ML-% IV SOLN
2.0000 g | Freq: Once | INTRAVENOUS | Status: AC
  Administered 2019-01-31: 09:00:00 2 g via INTRAVENOUS

## 2019-01-31 MED ORDER — MIDAZOLAM HCL 2 MG/2ML IJ SOLN
INTRAMUSCULAR | Status: AC | PRN
  Administered 2019-01-31: 1 mg via INTRAVENOUS

## 2019-01-31 MED ORDER — CEFAZOLIN SODIUM-DEXTROSE 2-4 GM/100ML-% IV SOLN
INTRAVENOUS | Status: AC
  Filled 2019-01-31: qty 100

## 2019-01-31 MED ORDER — MIDAZOLAM HCL 2 MG/2ML IJ SOLN
INTRAMUSCULAR | Status: AC
  Filled 2019-01-31: qty 2

## 2019-01-31 NOTE — Progress Notes (Signed)
Ambulated to bathroom with assistance tol well

## 2019-01-31 NOTE — Discharge Instructions (Addendum)
Indwelling Pleural Catheter Home Guide  An indwelling pleural catheter is a thin, flexible tube that is inserted under your skin and into your chest. The catheter drains excess fluid that collects in the area between the chest wall and the lungs (pleural space).After the catheter is inserted, it can be attached to a bottle that collects fluid. The pleural catheter will allow you to drain fluid from your chest at home on a regular basis (sometimes daily). This will eliminate the need for frequent visits to the hospital or clinic to drain the fluid. The catheter may be removed after the excess fluid problem is resolved, usually after 2-3 months. It is important to follow instructions from your health care provider about how to drain and care for your catheter. What are the risks? Generally, this is a safe procedure. However, problems may occur, including:  Infection.  Skin damage around the catheter.  Lung damage.  Failure of the chest tube to work properly.  Spreading of cancer cells along the catheter, if you have cancer. Supplies needed:  Vacuum-sealed drainage bottle with attached drainage line.  Sterile dressing.  Sterile alcohol pads.  Sterile gloves.  Valve cap.  Sterile gauze pads, 4  4 inch (10 cm  10 cm).  Tape.  Adhesive dressing.  Sterile foam catheter pad. How to care for your catheter and insertion site  Wash your hands with soap and warm water before and after touching the catheter or insertion site. If soap and water are not available, use hand sanitizer.  Check your bandage (dressing) daily to make sure it is clean and dry.  Keep the skin around the catheter clean and dry.  Check the catheter regularly for any cracks or kinks in the tubing.  Check your catheter insertion site every day for signs of infection. Check for: ? Skin breakdown. ? Redness, swelling, or pain. ? Fluid or blood. ? Warmth. ? Pus or a bad smell. How to drain your catheter You  may need to drain your catheter every day, or more or less often as told by your health care provider. Follow instructions from your health care provider about how to drain your catheter. You may also refer to instructions that come with the drainage system. To drain the catheter: 1. Wash your hands with soap and warm water. If soap and water are not available, use hand sanitizer. 2. Carefully remove the dressing from around the catheter. 3. Wash your hands again. 4. Put on the gloves provided. 5. Prepare the vacuum-sealed drainage bottle and drainage line. Close the drainage line of the vacuum-sealed drainage bottle by squeezing the pinch clamp or rolling the wheel of the roller clamp toward the bottle. The vacuum in the bottle will be lost if the line is not closed completely. 6. Remove the access tip cover from the drainage line. Do not touch the end. Set it on a sterile surface. 7. Remove the catheter valve cap and throw it away. 8. Use an alcohol pad to clean the end of the catheter. 9. Insert the access tip into the catheter valve. Make sure the valve and access tip are securely connected. Listen for a click to confirm that they are connected. 10. Insert the T plunger to break the vacuum seal on the drainage bottle. 11. Open the clamp on the drainage line. 12. Allow the catheter to drain. Keep the catheter and the drainage bottle below the level of your chest. There may be a one-way valve on the end of the  tubing that will allow liquid and air to flow out of the catheter without letting air inside. 13. Drain the amount of fluid as told by your health care provider. It usually takes 5-15 minutes. Do not drain more than 1000 mL of fluid. You may feel a little discomfort while you are draining. If the pain is severe, stop draining and contact your health care provider. 14. After you finish draining the catheter, remove the drainage bottle tubing from the catheter. 15. Use a clean alcohol pad to  wipe the catheter tip. 16. Place a clean cap on the end of the catheter. 17. Use an alcohol pad to clean the skin around the catheter. 18. Allow the skin to air-dry. 19. Put the catheter pad on your skin. Curl the catheter into loops and place it on the pad. Do not place the catheter on your skin. 20. Replace the dressing over the catheter. 21. Discard the drainage bottle as instructed by your health care provider. Do not reuse the drainage bottle. How to change your dressing Change your dressing at least once a week, or more often if needed to keep the dressing dry. Be sure to change the dressing whenever it becomes moist. Your health care provider will tell you how often to change your dressing. 1. Wash your hands with soap and warm water. If soap and water are not available, use hand sanitizer. 2. Gently remove the old dressing. Avoid using scissors to remove the dressing. Sharp objects may damage the catheter. 3. Wash the skin around the insertion site with mild, fragrance-free soap and warm water. Rinse well, then pat the area dry with a clean cloth. 4. Check the skin around the catheter for signs of infection. Check for: ? Skin breakdown. ? Redness, swelling, or pain. ? Fluid or blood. ? Warmth. ? Pus or a bad smell. 5. If your catheter was stitched (sutured) to your skin, look at the suture to make sure it is still anchored in your skin. 6. Do not apply creams, ointments, or alcohol to the area. Let your skin air-dry completely before you apply a new dressing. 7. Curl the catheter into loops and place it on the sterile catheter pad. Do not place the catheter on your skin. 8. If you do not have a pad, use a clean dressing. Slide the dressing under the disk that holds the drainage catheter in place. 9. Use gauze to cover the catheter and the catheter pad. The catheter should rest on the pad or dressing, not on your skin. 10. Tape the dressing to your skin. You may be instructed to use an  adhesive dressing covering instead of gauze and tape. 11. Wash your hands with soap and warm water. If soap and water are not available, use hand sanitizer. General recommendations  Always wash your hands with soap and warm water before and after caring for your catheter and drainage bottle. Use a mild, fragrance-free soap. If soap and water are not available, use hand sanitizer.  Always make sure there are no leaks in the catheter or drainage bottle.  Each time you drain the catheter, note the color and amount of fluid.  Do not touch the tip of the catheter or the drainage bottle tubing.  Do not reuse drainage bottles.  Do not take baths, swim, or use a hot tub until your health care provider approves. Ask your health care provider if you may take showers. You may only be allowed to take sponge baths.  Take  deep breaths regularly, followed by a cough. Doing this can help to prevent lung infection. Contact a health care provider if:  You have any questions about caring for your catheter or drainage bottle.  You still have pain at the catheter insertion site more than 2 days after your procedure.  You have pain while draining your catheter.  Your catheter becomes bent, twisted, or cracked.  The connection between the catheter and the collection bottle becomes loose.  You have any of these around your catheter insertion site or coming from it: ? Skin breakdown. ? Redness, swelling, or pain. ? Fluid or blood. ? Warmth. ? Pus or a bad smell. Get help right away if:  You have a fever or chills.  You have chest pain.  You have dizziness or shortness of breath.  You have severe redness, swelling, or pain at your catheter insertion site.  The catheter comes out.  The catheter is blocked or clogged. Summary  An indwelling pleural catheter is a thin, flexible tube that is inserted under your skin and into your chest. The catheter drains excess fluid that collects in the area  between the chest wall and the lungs (pleural space).  It is important to follow instructions from your health care provider about how to drain and care for your catheter.  Do not touch the tip of the catheter or the drainage bottle tubing.  Always wash your hands with soap and water before and after caring for your catheter and drainage bottle. If soap and water are not available, use hand sanitizer. This information is not intended to replace advice given to you by your health care provider. Make sure you discuss any questions you have with your health care provider. Document Released: 10/21/2016 Document Revised: 10/20/2018 Document Reviewed: 10/21/2016 Elsevier Patient Education  [place]. Moderate Conscious Sedation, Adult, Care After These instructions provide you with information about caring for yourself after your procedure. Your health care provider may also give you more specific instructions. Your treatment has been planned according to current medical practices, but problems sometimes occur. Call your health care provider if you have any problems or questions after your procedure. What can I expect after the procedure? After your procedure, it is common:  To feel sleepy for several hours.  To feel clumsy and have poor balance for several hours.  To have poor judgment for several hours.  To vomit if you eat too soon. Follow these instructions at home: For at least 24 hours after the procedure:   Do not: ? Participate in activities where you could fall or become injured. ? Drive. ? Use heavy machinery. ? Drink alcohol. ? Take sleeping pills or medicines that cause drowsiness. ? Make important decisions or sign legal documents. ? Take care of children on your own.  Rest. Eating and drinking  Follow the diet recommended by your health care provider.  If you vomit: ? Drink water, juice, or soup when you can drink without vomiting. ? Make sure you have  little or no nausea before eating solid foods. General instructions  Have a responsible adult stay with you until you are awake and alert.  Take over-the-counter and prescription medicines only as told by your health care provider.  If you smoke, do not smoke without supervision.  Keep all follow-up visits as told by your health care provider. This is important. Contact a health care provider if:  You keep feeling nauseous or you keep vomiting.  You feel light-headed.  You develop a rash.  You have a fever. Get help right away if:  You have trouble breathing. This information is not intended to replace advice given to you by your health care provider. Make sure you discuss any questions you have with your health care provider. Document Released: 04/18/2013 Document Revised: 06/10/2017 Document Reviewed: 10/18/2015 Elsevier Patient Education  2020 [employer].

## 2019-01-31 NOTE — Procedures (Signed)
Interventional Radiology Procedure Note  Procedure:  Placement of new right tunneled pleural drain.  Removal of the prior tunneled pleural drain.  Findings: The prior drain had inspissated clot/debris within ~50% length of the internal lumen.  The US showed complex fluid remaining in the dependent pleural space.  CXR shows fluid partially loculated.  >> The  Effusion is complex and may not drain well with any current or future pleurex.  Removed ~400cc of amber fluid with the new drain.   Complications: None  Recommendations:  - Ok to shower tomorrow - Do not submerge - Routine care   Signed,  Dulcy Fanny. Earleen Newport, DO

## 2019-01-31 NOTE — H&P (Signed)
Chief Complaint: Patient was seen in consultation today for Right pleurX catheter removal; replacement and drain fluid   Referring Physician(s): Dr Mayme Genta  Supervising Physician: Corrie Mckusick  Patient Status: [facility] - Out-pt  History of Present Illness: Darrell [name] is a 73 y.o. male   Hx Lung Ca Recurrent Rt pleural effusion Tunneled PleurX catheter placed 11/15/18  Worked well for while Has had increasing problems with this catheter for several weeks TPA dwell 01/23/19 helped for for few days  Called 7/20-- again- slow flow; little OP  Scheduled now for removal; replacement and drain fluid today in IR  Pt fell yesterday and broke left arm IMPRESSION: 1. Acute traumatic impacted fracture involving the humeral head and neck. 2. Apparent free fragment which includes the greater tuberosity.  Past Medical History:  Diagnosis Date   Aortic atherosclerosis ([place]) 07/18/2018   Cancer ([place])    Cellulitis of right lower extremity    Chronic kidney disease    Diabetes mellitus without complication ([place])    Dyslipidemia    Hypertension    Thoracic aortic aneurysm without rupture ([place]) 07/18/2018    Past Surgical History:  Procedure Laterality Date   IR GUIDED DRAIN W CATHETER PLACEMENT  11/15/2018   IR INSTILL VIA CHEST TUBE AGENT FOR FIBRINOLYSIS INI DAY  01/23/2019   IR THORACENTESIS ASP PLEURAL SPACE W/IMG GUIDE  09/05/2018   IR THORACENTESIS ASP PLEURAL SPACE W/IMG GUIDE  10/11/2018   IR THORACENTESIS ASP PLEURAL SPACE W/IMG GUIDE  10/30/2018   PORTACATH PLACEMENT Left 02/21/2018   Procedure: INSERTION PORT-A-CATH;  Surgeon: Grace Isaac, MD;  Location: [place];  Service: Thoracic;  Laterality: Left;    Allergies: Lisinopril  Medications: Prior to Admission medications   Medication Sig Start Date End Date Taking? Authorizing Provider  albuterol (PROVENTIL HFA;VENTOLIN HFA) 108 (90 Base) MCG/ACT inhaler Inhale 2 puffs into the lungs every 4 (four)  hours as needed for wheezing or shortness of breath. 08/03/18  Yes Byrum, Rose Fillers, MD  carvedilol (COREG) 6.25 MG tablet Take 6.25 mg by mouth 2 (two) times daily with a meal.  07/19/16  Yes [provider]  cloNIDine (CATAPRES) 0.1 MG tablet Take 0.1 mg by mouth 2 (two) times daily.  08/31/16  Yes [provider]  fenofibrate 160 MG tablet Take 160 mg by mouth daily.  11/13/17  Yes [provider]  hydrochlorothiazide (HYDRODIURIL) 25 MG tablet Take 25 mg by mouth daily.  02/06/18  Yes [provider]  Insulin Detemir (LEVEMIR FLEXTOUCH) 100 UNIT/ML Pen Inject 10 Units into the skin daily.  07/18/15  Yes [provider]  lidocaine-prilocaine (EMLA) cream Apply 1 application topically as needed. Patient taking differently: Apply 1 application topically as needed (port).  02/16/18  Yes Curt Bears, MD  losartan (COZAAR) 50 MG tablet Take 50 mg by mouth daily.  01/08/18  Yes [provider]  magnesium oxide (MAG-OX) 400 MG tablet Take 400 mg by mouth daily.   Yes [provider]  metFORMIN (GLUCOPHAGE) 1000 MG tablet Take 1,000 mg by mouth 2 (two) times daily with a meal.  11/13/17  Yes [provider]  Multiple Vitamin (MULTI-VITAMINS) TABS Take 1 tablet by mouth daily.    Yes [provider]  Omega-3 1000 MG CAPS Take 1,200 mg by mouth 2 (two) times daily.    Yes [provider]  oxyCODONE-acetaminophen (PERCOCET/ROXICET) 5-325 MG tablet Take 1 tablet by mouth every 6 (six) hours as needed for severe pain. 01/30/19  Yes Caccavale, Sophia, PA-C  pioglitazone (ACTOS) 45 MG tablet Take 45 mg by mouth daily.  02/06/18  Yes [provider]  pravastatin (PRAVACHOL) 40 MG tablet Take 40 mg by mouth daily.  01/08/18  Yes [provider]  traMADol (ULTRAM) 50 MG tablet Take 1 tablet (50 mg total) by mouth every 6 (six) hours as needed for severe pain. 06/23/18  Yes Curt Bears, MD  Cinnamon Bark POWD Take  1,000 mg by mouth 2 (two) times daily.     [provider]  Insulin Pen Needle (FIFTY50 PEN NEEDLES) 31G X 8 MM MISC USE AS DIRECTED 06/30/15   [provider]  potassium chloride SA (K-DUR,KLOR-CON) 20 MEQ tablet Take 1 tablet (20 mEq total) by mouth daily. 09/18/18   Heilingoetter, Cassandra L, PA-C  prochlorperazine (COMPAZINE) 10 MG tablet TAKE 1 TABLET BY MOUTH EVERY 6 HOURS AS NEEDED FOR NAUSEA OR VOMITING Patient not taking: Reported on 01/22/2019 02/21/18   Curt Bears, MD     Family History  Problem Relation Age of Onset   Breast cancer Mother    CAD Father     Social History   Socioeconomic History   Marital status: Married    Spouse name: Not on file   Number of children: Not on file   Years of education: Not on file   Highest education level: Not on file  Occupational History   Not on file  Social Needs   Financial resource strain: Not on file   Food insecurity    Worry: Not on file    Inability: Not on file   Transportation needs    Medical: Not on file    Non-medical: Not on file  Tobacco Use   Smoking status: Former Smoker    Packs/day: 2.00    Years: 35.00    Pack years: 70.00    Types: Cigarettes    Quit date: 07/12/1996    Years since quitting: 22.5   Smokeless tobacco: Never Used  Substance and Sexual Activity   Alcohol use: Yes    Comment: occasionally   Drug use: Never   Sexual activity: Not Currently  Lifestyle   Physical activity    Days per week: Not on file    Minutes per session: Not on file   Stress: Not on file  Relationships   Social connections    Talks on phone: Not on file    Gets together: Not on file    Attends religious service: Not on file    Active member of club or organization: Not on file    Attends meetings of clubs or organizations: Not on file    Relationship status: Not on file  Other Topics Concern   Not on file  Social History Narrative   05-15-18 Unable to ask abuse  questions wife with him today    Review of Systems: A 12 point ROS discussed and pertinent positives are indicated in the HPI above.  All other systems are negative.  Review of Systems  Constitutional: Positive for activity change. Negative for fatigue and fever.  Respiratory: Positive for shortness of breath. Negative for cough and choking.   Cardiovascular: Negative for chest pain.  Gastrointestinal: Negative for abdominal pain.  Neurological: Negative for weakness.  Psychiatric/Behavioral: Negative for behavioral problems and confusion.    Vital Signs: BP (!) 141/75    Pulse 73    Temp (!) 97.2 F (36.2 C) (Oral)    Ht 5\' 9"  (1.753 m)  Wt 270 lb (122.5 kg)    SpO2 100%    BMI 39.87 kg/m   Physical Exam Vitals signs reviewed.  Cardiovascular:     Rate and Rhythm: Normal rate and regular rhythm.     Heart sounds: Normal heart sounds.  Pulmonary:     Effort: Pulmonary effort is normal.     Breath sounds: Normal breath sounds.     Comments: Sounds diminished on right Musculoskeletal:     Comments: Left arm in sling  Skin:    General: Skin is warm and dry.  Neurological:     Mental Status: He is alert and oriented to person, place, and time.  Psychiatric:        Mood and Affect: Mood normal.        Behavior: Behavior normal.        Thought Content: Thought content normal.        Judgment: Judgment normal.     Imaging: Dg Chest 1 View  Result Date: 01/03/2019 CLINICAL DATA:  Increasing sob in pt with hx of pleural effusions with pluerix drainage zero in past 2 days, EXAM: CHEST  1 VIEW COMPARISON:  10/30/2018 FINDINGS: Right PleurX catheter loops at the lung base. Slight increase in moderate right pleural effusion. Right perihilar opacity stable. Left lung clear. Heart size normal. Stable left subclavian power injectable port catheter. No pneumothorax. Visualized bones unremarkable. IMPRESSION: Slight increase in moderate right pleural effusion. Electronically Signed   By:  Lucrezia Europe M.D.   On: 01/03/2019 17:28   Dg Chest 2 View  Result Date: 01/06/2019 CLINICAL DATA:  Pleural effusion with PleurX drainage catheter EXAM: CHEST - 2 VIEW COMPARISON:  January 03, 2019 chest radiograph and chest CT Dec 08, 2018 FINDINGS: PleurX catheter tip is in the lateral right base, stable. There is a persistent partially loculated right pleural effusion with atelectasis in the right base. There is perihilar opacity on the right, stable. Right middle lobe consolidation/collapse is grossly stable, better seen on recent CT. Left lung is clear. Heart is upper normal in size with pulmonary vascularity normal. There is aortic atherosclerosis. Port-A-Cath tip is in the superior vena cava. There is degenerative change in the thoracic spine. IMPRESSION: Stable PleurX catheter positioning. Loculated right pleural effusion with right base atelectasis appears essentially stable. Right middle lobe consolidation/collapse is better seen on the lateral view and is felt to be unchanged from recent CT. Right perihilar opacity consistent with consolidation is grossly stable. No new opacity evident. Left lung clear. Stable cardiac silhouette. Aortic Atherosclerosis (ICD10-I70.0). Electronically Signed   By: Lowella Grip III M.D.   On: 01/06/2019 10:12   Ct Chest Wo Contrast  Result Date: 01/16/2019 CLINICAL DATA:  Follow-up pleural effusion. EXAM: CT CHEST WITHOUT CONTRAST TECHNIQUE: Multidetector CT imaging of the chest was performed following the standard protocol without IV contrast. COMPARISON:  12/08/2018 FINDINGS: Cardiovascular: Normal heart size. No pericardial effusion. Injectable port in stable position. Calcific atherosclerotic disease of the coronary arteries and aorta. Stable fusiform ascending aortic aneurysm measuring 4.4 cm. Mediastinum/Nodes: Borderline enlarged right supraclavicular lymph node measures 10 mm in short axis. No discrete hilar lymphadenopathy. Lungs/Pleura: Further progression of  loculations in stable in size right pleural effusion with chest tube in place. Stable area of masslike architectural distortion within the perihilar right lung, which extends to the right upper lobe, thought to represent posttreatment changes. Several new ground-glass subpleural pulmonary nodules in the right lower lobe, the largest measuring 2.2 cm. The left lung is  clear. Upper Abdomen: No acute abnormality. Musculoskeletal: No chest wall mass or suspicious bone lesions identified. Multilevel spondylosis of the thoracic spine. IMPRESSION: 1. Further progression of loculations in moderate in size right pleural effusion with chest tube in place. 2. Stable masslike architectural distortion in the perihilar right lung, which extends to the right upper lobe, considered to represent posttreatment changes. 3. Several new ground-glass subpleural pulmonary nodules in the right lower lobe, the largest measuring 2.2 cm. These have indeterminate appearance and may represent infectious/inflammatory nodules or potentially, but less likely, progression of disease. 4. Borderline enlarged right supraclavicular lymph node. 5. Stable fusiform ascending aortic aneurysm measuring 4.4 cm. Recommend annual imaging followup by CTA or MRA. This recommendation follows 2010 ACCF/AHA/AATS/ACR/ASA/SCA/SCAI/SIR/STS/SVM Guidelines for the Diagnosis and Management of Patients with Thoracic Aortic Disease. 2010; 121: Z124-P809. Aortic Atherosclerosis (ICD10-I70.0). Aortic aneurysm NOS (ICD10-I71.9). Electronically Signed   By: Fidela Salisbury M.D.   On: 01/16/2019 11:20   Dg Shoulder Left  Result Date: 01/30/2019 CLINICAL DATA:  73 year old who fell and injured the LEFT shoulder earlier today. Patient currently unable to abduct the arm. Initial encounter. EXAM: LEFT SHOULDER - 2+ VIEW COMPARISON:  None. FINDINGS: Acute impacted fracture involving the humeral head and neck. An apparent free fragment is present which includes the greater  tuberosity. Glenohumeral joint anatomically aligned with well-preserved joint space. Acromioclavicular joint anatomically aligned with only mild degenerative changes. Port-A-Cath port overlies the lower shoulder joint on the AP image. IMPRESSION: 1. Acute traumatic impacted fracture involving the humeral head and neck. 2. Apparent free fragment which includes the greater tuberosity. Electronically Signed   By: Evangeline Dakin M.D.   On: 01/30/2019 14:00   Ir Instill Via Ch/tube Fibrinolysis Ini  Result Date: 01/23/2019 INDICATION: Patient with history of lung cancer with recurrent malignant right pleural effusion s/p right PleurX catheter placement 11/15/2018 by Dr. Annamaria Boots. He presented to [facility] ED 01/06/2019 due to poor output from PleurX/increased dyspnea- subsequently underwent tPA dwell same day which initially improved output. He then presented to [facility] IR 01/10/2019 for PleurX evaluation with same complaints (decreased output/increased symptoms)- approximately 200 cc pleural fluid removed. Case discussed with Dr. Vernard Gambles who recommended CT chest for possible PleurX revision. CT chest 01/16/2019 revealed PleurX in good position. Patient presented to [facility] IR today for again PleurX evaluation with same complaints- decreased output/increased dyspnea. Moderately large persistent loculated right effusion. EXAM: RIGHT PLEURX CATHETER EVALUATION MEDICATIONS: None. ANESTHESIA/SEDATION: None. COMPLICATIONS: None immediate. PROCEDURE: Right PleurX catheter dressed appropriately, insertion site clean, dry, and intact. Initial connection of PleurX to drainage system drained minimal output of dark red pleural fluid. A limited chest ultrasound was performed which showed a large amount of residual pleural fluid on the right side with PleurX catheter visualized in pleural space. Case was discussed with Dr. Vernard Gambles who recommended tPA dwell. 6 mg tPA was mixed in 30 cc NS and injected into right PleurX catheter at approximately 1505.  This mixture was removed from right PleurX catheter at approximately 1705 (2 hour dwell). During dwell, patient was asked to switch positions (sitting, laying on back, laying on left/right side) to allow for maximum tPA effects. Following removal of tPA mixture from drainage catheter, right PleurX was connected to drainage system and 900 mL of dark red fluid was removed. A limited chest ultrasound was preformed which showed little to no residual pleural fluid on right side. IMPRESSION: Successful right PleurX catheter tPA dwell with subsequent removal of 900 mL of pleural fluid via right PleurX catheter. Patient  advised to call our office or return to ED if symptoms return. Read by: Earley Abide, PA-C Electronically Signed   By: Lucrezia Europe M.D.   On: 01/23/2019 17:37   Ir US Chest  Result Date: 01/23/2019 INDICATION: Patient with history of lung cancer with recurrent malignant right pleural effusion s/p right PleurX catheter placement 11/15/2018 by Dr. Annamaria Boots. He presented to [facility] ED 01/06/2019 due to poor output from PleurX/increased dyspnea- subsequently underwent tPA dwell same day which initially improved output. He then presented to [facility] IR 01/10/2019 for PleurX evaluation with same complaints (decreased output/increased symptoms)- approximately 200 cc pleural fluid removed. Case discussed with Dr. Vernard Gambles who recommended CT chest for possible PleurX revision. CT chest 01/16/2019 revealed PleurX in good position.  Patient presented to [facility] IR today for again PleurX evaluation with same complaints- decreased output/increased dyspnea. Moderately large persistent loculated right effusion.  EXAM: RIGHT PLEURX CATHETER EVALUATION  MEDICATIONS: None.  ANESTHESIA/SEDATION: None.  COMPLICATIONS: None immediate.  PROCEDURE: Right PleurX catheter dressed appropriately, insertion site clean, dry, and intact. Initial connection of PleurX to drainage system drained minimal output of dark red pleural fluid. A limited chest  ultrasound was performed which showed a large amount of residual pleural fluid on the right side with PleurX catheter visualized in pleural space.  Case was discussed with Dr. Vernard Gambles who recommended tPA dwell. 6 mg tPA was mixed in 30 cc NS and injected into right PleurX catheter at approximately 1505. This mixture was removed from right PleurX catheter at approximately 1705 (2 hour dwell). During dwell, patient was asked to switch positions (sitting, laying on back, laying on left/right side) to allow for maximum tPA effects. Following removal of tPA mixture from drainage catheter, right PleurX was connected to drainage system and 900 mL of dark red fluid was removed. A limited chest ultrasound was preformed which showed little to no residual pleural fluid on right side.  IMPRESSION: Successful right PleurX catheter tPA dwell with subsequent removal of 900 mL of pleural fluid via right PleurX catheter.  Patient advised to call our office or return to ED if symptoms return.  Read by: Earley Abide, PA-C   Electronically Signed   By: Lucrezia Europe M.D.   On: 01/23/2019 17:37   Ir US Chest  Result Date: 01/10/2019 CLINICAL DATA:  Patient with history of right lung cancer and recurrent right pleural effusion s/p right Pleurx placement 11/15/18. Patient presented to the ED on 01/06/19 due to complaints dyspnea and little to no output from Pleurx for previous 2 weeks - imaging during this visit showed a well-positioned Pleurx catheter and tPA dwell was performed. After tPA dwell a total of 950 cc of pleural fluid was drained and patient's symptoms resolved. He presents today for evaluation of his Pleurx catheter due to decreased output and rate of flow. He reports that he typically drains approximately 1 liter of fluid every other day however the last time he successfully used the Pleurx was 2 days ago when 700 cc was drained. He reports that the fluid came out very slow and sporadically and took approximately 45  minutes to drain. He denies any dyspnea, chest pain, cough or leakage of fluids from insertion site. EXAM: Right Pleurx dressed appropriately, insertion site clean, dry and intact. Connected Pleurx to drainage system and ultimately drained 200 cc of golden pleural fluid over the course of approximately 1 hour. A limited chest ultrasound was performed which showed a large amount of residual pleural fluid on the right.  The Pleurx catheter was visualized in the pleural space. FINDINGS: Patient with decreased output from right Pleurx s/p tPA dwell 01/06/19 in the ED which initially improved output, however on exam today only 200 cc of pleural fluid was removed with large pleural effusion per ultrasound remaining. IMPRESSION: Patient discussed today with Dr. Vernard Gambles who recommends obtaining an outpatient non-contrast CT chest to evaluate tubing position and scheduling patient for Pleurx revision. Patient is aware of and agrees to plan, he understands to call our office or return to the ED if he experiences worsening symptoms. Read by Candiss Norse, PA-C Electronically Signed   By: Lucrezia Europe M.D.   On: 01/10/2019 16:35    Labs:  CBC: Recent Labs    12/25/18 0810 01/06/19 0946 01/08/19 0913 01/22/19 0806  WBC 2.9* 3.4* 3.2* 3.1*  HGB 9.5* 9.3* 9.4* 9.5*  HCT 30.2* 30.3* 29.2* 30.1*  PLT 197 188 184 195    COAGS: Recent Labs    02/21/18 1120 11/15/18 1133  INR 1.12 1.3*  APTT 34  --     BMP: Recent Labs    12/25/18 0810 01/06/19 0946 01/08/19 0913 01/22/19 0806  NA 140 138 136 138  K 3.9 3.9 4.2 4.1  CL 104 102 102 103  CO2 25 25 25 24   GLUCOSE 119* 111* 143* 102*  BUN 38* 36* 32* 36*  CALCIUM 8.6* 9.2 8.8* 8.7*  CREATININE 1.61* 1.48* 1.59* 1.60*  GFRNONAA 43* 47* 43* 43*  GFRAA 49* 55* 50* 50*    LIVER FUNCTION TESTS: Recent Labs    12/12/18 0925 12/25/18 0810 01/08/19 0913 01/22/19 0806  BILITOT 0.7 0.5 0.7 0.6  AST 21 16 17 18   ALT 10 13 12 10   ALKPHOS 38 43 43  43  PROT 6.8 6.6 6.9 6.8  ALBUMIN 3.2* 3.3* 3.3* 3.3*    TUMOR MARKERS: No results for input(s): AFPTM, CEA, CA199, CHROMGRNA in the last 8760 hours.  Assessment and Plan:  Lung Ca RT recurrent pleural effusion PleurX cath placed 11/15/18--- not working well even after TPA dwell 01/23/19 Now for removal; replacement and fluid drainage Pt is aware of procedure benefits and risks including but not limited to:  infection; bleeding; pneumothorax; damage to surrounding structures Agreeable to proceed Consent  signed in chart  Thank you for this interesting consult.  I greatly enjoyed meeting Darrell [name] and look forward to participating in their care.  A copy of this report was sent to the requesting provider on this date.  Electronically Signed: Lavonia Drafts, PA-C 01/31/2019, 8:27 AM   I spent a total of    25 Minutes in face to face in clinical consultation, greater than 50% of which was counseling/coordinating care for right pleurx cath removal; replacement; drain fluid

## 2019-01-31 NOTE — Progress Notes (Signed)
Discharge instructions  Reviewed with pt and his wife (via telephone) voices understanding.

## 2019-02-02 ENCOUNTER — Ambulatory Visit (INDEPENDENT_AMBULATORY_CARE_PROVIDER_SITE_OTHER): Payer: Medicare Other | Admitting: Orthopaedic Surgery

## 2019-02-02 ENCOUNTER — Encounter: Payer: Self-pay | Admitting: Orthopaedic Surgery

## 2019-02-02 DIAGNOSIS — S42202A Unspecified fracture of upper end of left humerus, initial encounter for closed fracture: Secondary | ICD-10-CM | POA: Diagnosis not present

## 2019-02-02 MED ORDER — OXYCODONE-ACETAMINOPHEN 5-325 MG PO TABS: 1.0000 | ORAL_TABLET | Freq: Four times a day (QID) | ORAL | 0 refills | Status: DC | PRN

## 2019-02-02 NOTE — Progress Notes (Signed)
Office Visit Note   Patient: Darrell [name]           Date of Birth: 08/23/1945           MRN: [id number] Visit Date: 02/02/2019              Requested by: [name], MD [place],  [place] [id number] PCP: [name], MD   Assessment & Plan: Visit Diagnoses:  1. Closed fracture of proximal end of left humerus, unspecified fracture morphology, initial encounter     Plan: X-rays were reviewed again copy of images.  Patient will not require surgical intervention for this problem.  He will continue with the sling he will do better sleeping in the beachchair recliner type position.  Percocet 30 tablets prescribed.  He has had a bowel movement in 3 days.  He will get some stool softener enema make sure he does not have impaction.  Recheck 3 weeks repeat x-rays 2 views left proximal humerus on return.  Follow-Up Instructions: Return in about 3 weeks (around 02/23/2019).   Orders:  No orders of the defined types were placed in this encounter.  No orders of the defined types were placed in this encounter.     Procedures: No procedures performed   Clinical Data: No additional findings.   Subjective: Chief Complaint  Patient presents with  . 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  Injury date was 01/30/2019 he fell injuring his left proximal humerus with an impacted humeral neck fracture.  He does not have significant angulation he was given a Percocet he is in a sling.  He has had his ring on for many years wedding band ring finger and this had to be cut off today due to hand swelling.  Review of Systems 14 point review system positive for diabetes type 2 stage III kidney disease squamous cell lung cancer on immunotherapy history of cholecystectomy, Port-A-Cath.  Thoracic aorta  aneurysm otherwise noncontributory as pertains HPI.  Patient quit smoking 20 to 30 years ago.   Objective: Vital Signs: Ht 5\' 9"  (1.753 m)   Wt 270 lb (122.5 kg)   BMI 39.87 kg/m   Physical Exam Constitutional:      Appearance: He is well-developed.  HENT:     Head: Normocephalic and atraumatic.  Eyes:     Pupils: Pupils are equal, round, and reactive to light.  Neck:     Thyroid: No thyromegaly.     Trachea: No tracheal deviation.  Cardiovascular:     Rate and Rhythm: Normal rate.  Pulmonary:     Effort: Pulmonary effort is normal.     Breath sounds: No wheezing.  Abdominal:     General: Bowel sounds are normal.     Palpations: Abdomen is soft.  Skin:    General: Skin is warm and dry.     Capillary Refill: Capillary refill takes less than 2 seconds.  Neurological:     Mental Status: He is alert and oriented to person, place, and time.  Psychiatric:        Behavior: Behavior normal.        Thought Content: Thought content normal.        Judgment: Judgment normal.     Ortho Exam patient is in a wheelchair fusiform  swelling of the hand he has a ring he has taken for years significant ring finger swelling and ring had to be removed with a ring cutter.  Axillary sensation over the deltoid is intact.  Median ulnar sensation is intact.  He is using his phone of both hands. Specialty Comments:  No specialty comments available.  Imaging: Study Result  CLINICAL DATA:  73 year old who fell and injured the LEFT shoulder earlier today. Patient currently unable to abduct the arm. Initial encounter.  EXAM: LEFT SHOULDER - 2+ VIEW  COMPARISON:  None.  FINDINGS: Acute impacted fracture involving the humeral head and neck. An apparent free fragment is present which includes the greater tuberosity. Glenohumeral joint anatomically aligned with well-preserved joint space. Acromioclavicular joint anatomically aligned with only mild degenerative changes. Port-A-Cath port  overlies the lower shoulder joint on the AP image.  IMPRESSION: 1. Acute traumatic impacted fracture involving the humeral head and neck. 2. Apparent free fragment which includes the greater tuberosity.   Electronically Signed   By: Evangeline Dakin M.D.      PMFS History: Patient Active Problem List   Diagnosis Date Noted  . Closed fracture of left proximal humerus 02/02/2019  . Hypokalemia 09/18/2018  . Dyspnea 08/29/2018  . COPD (chronic obstructive pulmonary disease) ([place]) 08/03/2018  . Hemoptysis 07/18/2018  . CKD (chronic kidney disease) stage 3, GFR 30-59 ml/min ([facility]) 07/18/2018  . Lobar pneumonia ([place]) 07/18/2018  . Thoracic aortic aneurysm without rupture ([place]) 07/18/2018  . Aortic atherosclerosis ([place]) 07/18/2018  . Encounter for antineoplastic immunotherapy 05/17/2018  . Port-A-Cath in place 03/27/2018  . Encounter for antineoplastic chemotherapy 02/16/2018  . 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 Goals of care, counseling/discussion 02/07/2018  . ANGIOKERATOMA, BLEEDING 10/06/2006  . DM2 (diabetes mellitus, type 2) ([place]) 10/06/2006  . HYPERLIPIDEMIA 10/06/2006  . HYPERTENSION, BENIGN 10/06/2006  . LATERAL MENISCUS TEAR, RIGHT 10/06/2006  . CHOLECYSTECTOMY, HX OF 10/06/2006   Past Medical History:  Diagnosis Date  . Aortic atherosclerosis ([place]) 07/18/2018  . Cancer ([place])   . Cellulitis of right lower extremity   . Chronic kidney disease   . Diabetes mellitus without complication ([place])   . Dyslipidemia   . Hypertension   . Thoracic aortic aneurysm without rupture ([place]) 07/18/2018    Family History  Problem Relation Age of Onset  . Breast cancer Mother   . CAD Father     Past Surgical History:  Procedure Laterality Date  . IR GUIDED DRAIN W CATHETER PLACEMENT  11/15/2018  . IR GUIDED DRAIN W CATHETER PLACEMENT  01/31/2019  . IR INSTILL VIA CHEST TUBE AGENT FOR FIBRINOLYSIS INI DAY  01/23/2019  . IR REMOVAL OF PLURAL CATH W/CUFF  01/31/2019  . IR  THORACENTESIS ASP PLEURAL SPACE W/IMG GUIDE  09/05/2018  . IR THORACENTESIS ASP PLEURAL SPACE W/IMG GUIDE  10/11/2018  . IR THORACENTESIS ASP PLEURAL SPACE W/IMG GUIDE  10/30/2018  . PORTACATH PLACEMENT Left 02/21/2018   Procedure: INSERTION PORT-A-CATH;  Surgeon: Grace Isaac, MD;  Location: [facility];  Service: Thoracic;  Laterality: Left;   Social History   Occupational History  . Not on file  Tobacco Use  . Smoking status: Former Smoker    Packs/day: 2.00    Years: 35.00    Pack years: 70.00    Types: Cigarettes    Quit date: 07/12/1996    Years since quitting: 22.5  . Smokeless tobacco: Never Used  Substance and Sexual Activity  . Alcohol use: Yes  Comment: occasionally  . Drug use: Never  . Sexual activity: Not Currently

## 2019-02-05 ENCOUNTER — Inpatient Hospital Stay: Payer: Medicare Other

## 2019-02-05 ENCOUNTER — Encounter: Payer: Self-pay | Admitting: Internal Medicine

## 2019-02-05 ENCOUNTER — Inpatient Hospital Stay (HOSPITAL_BASED_OUTPATIENT_CLINIC_OR_DEPARTMENT_OTHER): Payer: Medicare Other | Admitting: Internal Medicine

## 2019-02-05 ENCOUNTER — Other Ambulatory Visit: Payer: Self-pay

## 2019-02-05 VITALS — BP 109/64 | HR 93 | Temp 98.0°F | Resp 18 | Ht 69.0 in | Wt 271.8 lb

## 2019-02-05 DIAGNOSIS — I129 Hypertensive chronic kidney disease with stage 1 through stage 4 chronic kidney disease, or unspecified chronic kidney disease: Secondary | ICD-10-CM

## 2019-02-05 DIAGNOSIS — Z794 Long term (current) use of insulin: Secondary | ICD-10-CM

## 2019-02-05 DIAGNOSIS — E785 Hyperlipidemia, unspecified: Secondary | ICD-10-CM

## 2019-02-05 DIAGNOSIS — C3411 Malignant neoplasm of upper lobe, right bronchus or lung: Secondary | ICD-10-CM | POA: Diagnosis not present

## 2019-02-05 DIAGNOSIS — R5382 Chronic fatigue, unspecified: Secondary | ICD-10-CM

## 2019-02-05 DIAGNOSIS — Z5112 Encounter for antineoplastic immunotherapy: Secondary | ICD-10-CM

## 2019-02-05 DIAGNOSIS — N189 Chronic kidney disease, unspecified: Secondary | ICD-10-CM

## 2019-02-05 DIAGNOSIS — E119 Type 2 diabetes mellitus without complications: Secondary | ICD-10-CM

## 2019-02-05 DIAGNOSIS — I712 Thoracic aortic aneurysm, without rupture: Secondary | ICD-10-CM

## 2019-02-05 DIAGNOSIS — J910 Malignant pleural effusion: Secondary | ICD-10-CM

## 2019-02-05 DIAGNOSIS — Z95828 Presence of other vascular implants and grafts: Secondary | ICD-10-CM

## 2019-02-05 DIAGNOSIS — I700 Atherosclerosis of aorta: Secondary | ICD-10-CM

## 2019-02-05 DIAGNOSIS — Z79899 Other long term (current) drug therapy: Secondary | ICD-10-CM

## 2019-02-05 DIAGNOSIS — Z9221 Personal history of antineoplastic chemotherapy: Secondary | ICD-10-CM

## 2019-02-05 LAB — CBC WITH DIFFERENTIAL (CANCER CENTER ONLY)
Abs Immature Granulocytes: 0.01 10*3/uL (ref 0.00–0.07)
Basophils Absolute: 0 10*3/uL (ref 0.0–0.1)
Basophils Relative: 1 %
Eosinophils Absolute: 0.2 10*3/uL (ref 0.0–0.5)
Eosinophils Relative: 7 %
HCT: 27.8 % — ABNORMAL LOW (ref 39.0–52.0)
Hemoglobin: 9 g/dL — ABNORMAL LOW (ref 13.0–17.0)
Immature Granulocytes: 0 %
Lymphocytes Relative: 11 %
Lymphs Abs: 0.3 10*3/uL — ABNORMAL LOW (ref 0.7–4.0)
MCH: 29 pg (ref 26.0–34.0)
MCHC: 32.4 g/dL (ref 30.0–36.0)
MCV: 89.7 fL (ref 80.0–100.0)
Monocytes Absolute: 0.4 10*3/uL (ref 0.1–1.0)
Monocytes Relative: 12 %
Neutro Abs: 2.1 10*3/uL (ref 1.7–7.7)
Neutrophils Relative %: 69 %
Platelet Count: 198 10*3/uL (ref 150–400)
RBC: 3.1 MIL/uL — ABNORMAL LOW (ref 4.22–5.81)
RDW: 18.3 % — ABNORMAL HIGH (ref 11.5–15.5)
WBC Count: 3 10*3/uL — ABNORMAL LOW (ref 4.0–10.5)
nRBC: 0 % (ref 0.0–0.2)

## 2019-02-05 LAB — CMP (CANCER CENTER ONLY)
ALT: 10 U/L (ref 0–44)
AST: 18 U/L (ref 15–41)
Albumin: 3.2 g/dL — ABNORMAL LOW (ref 3.5–5.0)
Alkaline Phosphatase: 40 U/L (ref 38–126)
Anion gap: 10 (ref 5–15)
BUN: 31 mg/dL — ABNORMAL HIGH (ref 8–23)
CO2: 24 mmol/L (ref 22–32)
Calcium: 9.7 mg/dL (ref 8.9–10.3)
Chloride: 102 mmol/L (ref 98–111)
Creatinine: 1.54 mg/dL — ABNORMAL HIGH (ref 0.61–1.24)
GFR, Est AFR Am: 52 mL/min — ABNORMAL LOW (ref >60)
GFR, Estimated: 45 mL/min — ABNORMAL LOW (ref >60)
Glucose, Bld: 143 mg/dL — ABNORMAL HIGH (ref 70–99)
Potassium: 3.8 mmol/L (ref 3.5–5.1)
Sodium: 136 mmol/L (ref 135–145)
Total Bilirubin: 0.7 mg/dL (ref 0.3–1.2)
Total Protein: 6.9 g/dL (ref 6.5–8.1)

## 2019-02-05 LAB — TSH: TSH: 4.152 u[IU]/mL — ABNORMAL HIGH (ref 0.320–4.118)

## 2019-02-05 MED ORDER — SODIUM CHLORIDE 0.9 % IV SOLN
Freq: Once | INTRAVENOUS | Status: AC
  Administered 2019-02-05: 09:00:00 via INTRAVENOUS
  Filled 2019-02-05: qty 250

## 2019-02-05 MED ORDER — SODIUM CHLORIDE 0.9% FLUSH
10.0000 mL | INTRAVENOUS | Status: DC | PRN
  Administered 2019-02-05: 10 mL
  Filled 2019-02-05: qty 10

## 2019-02-05 MED ORDER — HEPARIN SOD (PORK) LOCK FLUSH 100 UNIT/ML IV SOLN
500.0000 [IU] | Freq: Once | INTRAVENOUS | Status: AC | PRN
  Administered 2019-02-05: 500 [IU]
  Filled 2019-02-05: qty 5

## 2019-02-05 MED ORDER — SODIUM CHLORIDE 0.9 % IV SOLN
10.0000 mg/kg | Freq: Once | INTRAVENOUS | Status: AC
  Administered 2019-02-05: 1120 mg via INTRAVENOUS
  Filled 2019-02-05: qty 20

## 2019-02-05 NOTE — Progress Notes (Signed)
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 First dose 02/27/2018.Status post 5 cycles.  CURRENT THERAPY:Consolidationimmunotherapy with Imfinzi 10 mg/kg every 2 weeks.First dose given on 05/17/2018.Status post 16 cycles.  INTERVAL HISTORY: Darrell [name] 73 y.o. male returns to the clinic today for follow-up visit.  The patient is feeling fine today with no concerning complaints except for shortness of breath at baseline increased with exertion.  He has replacement of the right Pleurx catheter by interventional radiology.  He also has a recent fall and impacted fracture of the humeral head and neck.  He is currently managed conservatively.  He denied having any chest pain, cough or hemoptysis.  He denied having any fever or chills.  He has no nausea, vomiting, diarrhea or constipation.  He has no headache or visual changes.  He has drainage of around 500 mL of pleural fluid every other day.  The patient is here today for evaluation before starting cycle #17.  MEDICAL HISTORY: Past Medical History:  Diagnosis Date   Aortic atherosclerosis ([place]) 07/18/2018   Cancer ([place])    Cellulitis of right lower extremity    Chronic kidney disease    Diabetes mellitus without complication ([place])    Dyslipidemia    Hypertension    Thoracic aortic aneurysm without rupture ([place]) 07/18/2018    ALLERGIES:  is allergic to lisinopril.  MEDICATIONS:  Current Outpatient Medications  Medication Sig Dispense Refill   albuterol (PROVENTIL HFA;VENTOLIN HFA) 108 (90 Base)  MCG/ACT inhaler Inhale 2 puffs into the lungs every 4 (four) hours as needed for wheezing or shortness of breath. 1 Inhaler 5   carvedilol (COREG) 6.25 MG tablet Take 6.25 mg by mouth 2 (two) times daily with a meal.      Cinnamon Bark POWD Take 1,000 mg by mouth 2 (two) times daily.      cloNIDine (CATAPRES) 0.1 MG tablet Take 0.1 mg by mouth 2 (two) times daily.      fenofibrate 160 MG tablet Take 160 mg by mouth daily.      hydrochlorothiazide (HYDRODIURIL) 25 MG tablet Take 25 mg by mouth daily.      Insulin Detemir (LEVEMIR FLEXTOUCH) 100 UNIT/ML Pen Inject 10 Units into the skin daily.      Insulin Pen Needle (FIFTY50 PEN NEEDLES) 31G X 8 MM MISC USE AS DIRECTED     lidocaine-prilocaine (EMLA) cream Apply 1 application topically as needed. (Patient taking differently: Apply 1 application topically as needed (port). ) 30 g 0   losartan (COZAAR) 50 MG tablet Take 50 mg by mouth daily.      magnesium oxide (MAG-OX) 400 MG tablet Take 400 mg by mouth daily.     metFORMIN (GLUCOPHAGE) 1000 MG tablet Take 1,000 mg by mouth 2 (two) times daily with a meal.      Multiple Vitamin (MULTI-VITAMINS) TABS Take 1 tablet by mouth daily.      Omega-3 1000 MG CAPS Take 1,200 mg by mouth 2 (two) times daily.      oxyCODONE-acetaminophen (PERCOCET/ROXICET) 5-325 MG tablet Take 1 tablet by mouth every 6 (six) hours as needed for  severe pain. (Patient not taking: Reported on 02/02/2019) 8 tablet 0   oxyCODONE-acetaminophen (PERCOCET/ROXICET) 5-325 MG tablet Take 1 tablet by mouth every 6 (six) hours as needed for severe pain. 30 tablet 0   pioglitazone (ACTOS) 45 MG tablet Take 45 mg by mouth daily.      potassium chloride SA (K-DUR,KLOR-CON) 20 MEQ tablet Take 1 tablet (20 mEq total) by mouth daily. 7 tablet 0   pravastatin (PRAVACHOL) 40 MG tablet Take 40 mg by mouth daily.      prochlorperazine (COMPAZINE) 10 MG tablet TAKE 1 TABLET BY MOUTH EVERY 6 HOURS AS NEEDED FOR NAUSEA OR VOMITING 385  tablet 0   traMADol (ULTRAM) 50 MG tablet Take 1 tablet (50 mg total) by mouth every 6 (six) hours as needed for severe pain. 20 tablet 0   No current facility-administered medications for this visit.    Facility-Administered Medications Ordered in Other Visits  Medication Dose Route Frequency Provider Last Rate Last Dose   sodium chloride flush (NS) 0.9 % injection 10 mL  10 mL Intracatheter PRN Curt Bears, MD   10 mL at 06/28/18 1501   sodium chloride flush (NS) 0.9 % injection 10 mL  10 mL Intracatheter PRN Curt Bears, MD   10 mL at 02/05/19 0818    SURGICAL HISTORY:  Past Surgical History:  Procedure Laterality Date   IR GUIDED DRAIN W CATHETER PLACEMENT  11/15/2018   IR GUIDED DRAIN W CATHETER PLACEMENT  01/31/2019   IR INSTILL VIA CHEST TUBE AGENT FOR FIBRINOLYSIS INI DAY  01/23/2019   IR REMOVAL OF PLURAL CATH W/CUFF  01/31/2019   IR THORACENTESIS ASP PLEURAL SPACE W/IMG GUIDE  09/05/2018   IR THORACENTESIS ASP PLEURAL SPACE W/IMG GUIDE  10/11/2018   IR THORACENTESIS ASP PLEURAL SPACE W/IMG GUIDE  10/30/2018   PORTACATH PLACEMENT Left 02/21/2018   Procedure: INSERTION PORT-A-CATH;  Surgeon: Grace Isaac, MD;  Location: [facility];  Service: Thoracic;  Laterality: Left;    REVIEW OF SYSTEMS:  A comprehensive review of systems was negative except for: Constitutional: positive for fatigue Respiratory: positive for dyspnea on exertion Musculoskeletal: positive for bone pain   PHYSICAL EXAMINATION: General appearance: alert, cooperative, fatigued and no distress Head: Normocephalic, without obvious abnormality, atraumatic Neck: no adenopathy, no JVD, supple, symmetrical, trachea midline and thyroid not enlarged, symmetric, no tenderness/mass/nodules Lymph nodes: Cervical, supraclavicular, and axillary nodes normal. Resp: clear to auscultation bilaterally Back: symmetric, no curvature. ROM normal. No CVA tenderness. Cardio: regular rate and rhythm, S1, S2 normal, no  murmur, click, rub or gallop GI: soft, non-tender; bowel sounds normal; no masses,  no organomegaly Extremities: extremities normal, atraumatic, no cyanosis or edema  ECOG PERFORMANCE STATUS: 1 - Symptomatic but completely ambulatory  Blood pressure 109/64, pulse 93, temperature 98 F (36.7 C), temperature source Oral, resp. rate 18, height 5\' 9"  (1.753 m), weight 271 lb 12.8 oz (123.3 kg), SpO2 97 %.  LABORATORY DATA: Lab Results  Component Value Date   WBC 3.1 (L) 01/31/2019   HGB 8.8 (L) 01/31/2019   HCT 28.7 (L) 01/31/2019   MCV 93.8 01/31/2019   PLT 202 01/31/2019      Chemistry      Component Value Date/Time   NA 138 01/22/2019 0806   K 4.1 01/22/2019 0806   CL 103 01/22/2019 0806   CO2 24 01/22/2019 0806   BUN 36 (H) 01/22/2019 0806   CREATININE 1.60 (H) 01/22/2019 0806      Component Value Date/Time   CALCIUM  8.7 (L) 01/22/2019 0806   ALKPHOS 43 01/22/2019 0806   AST 18 01/22/2019 0806   ALT 10 01/22/2019 0806   BILITOT 0.6 01/22/2019 0806       RADIOGRAPHIC STUDIES: Dg Chest 2 View  Result Date: 01/06/2019 CLINICAL DATA:  Pleural effusion with PleurX drainage catheter EXAM: CHEST - 2 VIEW COMPARISON:  January 03, 2019 chest radiograph and chest CT Dec 08, 2018 FINDINGS: PleurX catheter tip is in the lateral right base, stable. There is a persistent partially loculated right pleural effusion with atelectasis in the right base. There is perihilar opacity on the right, stable. Right middle lobe consolidation/collapse is grossly stable, better seen on recent CT. Left lung is clear. Heart is upper normal in size with pulmonary vascularity normal. There is aortic atherosclerosis. Port-A-Cath tip is in the superior vena cava. There is degenerative change in the thoracic spine. IMPRESSION: Stable PleurX catheter positioning. Loculated right pleural effusion with right base atelectasis appears essentially stable. Right middle lobe consolidation/collapse is better seen on the  lateral view and is felt to be unchanged from recent CT. Right perihilar opacity consistent with consolidation is grossly stable. No new opacity evident. Left lung clear. Stable cardiac silhouette. Aortic Atherosclerosis (ICD10-I70.0). Electronically Signed   By: Lowella Grip III M.D.   On: 01/06/2019 10:12   Ct Chest Wo Contrast  Result Date: 01/16/2019 CLINICAL DATA:  Follow-up pleural effusion. EXAM: CT CHEST WITHOUT CONTRAST TECHNIQUE: Multidetector CT imaging of the chest was performed following the standard protocol without IV contrast. COMPARISON:  12/08/2018 FINDINGS: Cardiovascular: Normal heart size. No pericardial effusion. Injectable port in stable position. Calcific atherosclerotic disease of the coronary arteries and aorta. Stable fusiform ascending aortic aneurysm measuring 4.4 cm. Mediastinum/Nodes: Borderline enlarged right supraclavicular lymph node measures 10 mm in short axis. No discrete hilar lymphadenopathy. Lungs/Pleura: Further progression of loculations in stable in size right pleural effusion with chest tube in place. Stable area of masslike architectural distortion within the perihilar right lung, which extends to the right upper lobe, thought to represent posttreatment changes. Several new ground-glass subpleural pulmonary nodules in the right lower lobe, the largest measuring 2.2 cm. The left lung is clear. Upper Abdomen: No acute abnormality. Musculoskeletal: No chest wall mass or suspicious bone lesions identified. Multilevel spondylosis of the thoracic spine. IMPRESSION: 1. Further progression of loculations in moderate in size right pleural effusion with chest tube in place. 2. Stable masslike architectural distortion in the perihilar right lung, which extends to the right upper lobe, considered to represent posttreatment changes. 3. Several new ground-glass subpleural pulmonary nodules in the right lower lobe, the largest measuring 2.2 cm. These have indeterminate  appearance and may represent infectious/inflammatory nodules or potentially, but less likely, progression of disease. 4. Borderline enlarged right supraclavicular lymph node. 5. Stable fusiform ascending aortic aneurysm measuring 4.4 cm. Recommend annual imaging followup by CTA or MRA. This recommendation follows 2010 ACCF/AHA/AATS/ACR/ASA/SCA/SCAI/SIR/STS/SVM Guidelines for the Diagnosis and Management of Patients with Thoracic Aortic Disease. 2010; 121: Z660-Y301. Aortic Atherosclerosis (ICD10-I70.0). Aortic aneurysm NOS (ICD10-I71.9). Electronically Signed   By: Fidela Salisbury M.D.   On: 01/16/2019 11:20   Ir Guided Niel Hummer W Catheter Placement  Result Date: 01/31/2019 INDICATION: 73 year old male with a history of malignant right-sided pleural effusion with nonfunctioning existing PleurX catheter which was placed Nov 15, 2018 EXAM: IMAGE GUIDED PLACEMENT OF A NEW RIGHT-SIDED PLEURAL TUNNELED CATHETER AND REMOVAL OF THE PRIOR CATHETER MEDICATIONS: 2 G ANCEF the antibiotics were administered within an appropriate time frame prior to  the initiation of the procedure. ANESTHESIA/SEDATION: Fentanyl 50 mcg IV; Versed 1.0 mg IV Moderate Sedation Time:  20 MINUTES The patient was continuously monitored during the procedure by the interventional radiology nurse under my direct supervision. COMPLICATIONS: None PROCEDURE: The procedure, risks, benefits, and alternatives were explained to the patient and the patient's family. Specific risks that were addressed included bleeding, infection, pneumothorax, need for further procedure, chance of delayed pneumothorax or hemorrhage, hemoptysis, cardiopulmonary collapse, death. Questions regarding the procedure were encouraged and answered. The patient understands and consents to the procedure. The right chest wall and the indwelling catheter was prepped with chlorhexidine in a sterile fashion, and a sterile drape was applied covering the operative field. A sterile gown and  sterile gloves were used for the procedure. Local anesthesia was provided with 1% Lidocaine. Ultrasound image documentation was performed. After creating a small skin incision, a 19 gauge needle was advanced into the pleural cavity/pleural fluid under ultrasound guidance. A guide wire was then advanced under fluoroscopy into the pleural space. Pleural access was dilated serially and a 16-French peel-away sheath placed. The skin and subcutaneous tissues were generously infiltrated with 1% lidocaine from the puncture site over the pleura along the intercostal margin anteriorly. A small stab incision was made with 11 blade scalpel at the insertion site of the catheter, and the catheter was back tunneled to the site at the pleural puncture. A tunneled CareFusion Pleurex catheter was placed. This was tunneled from the incision 5 cm anterior to the pleural access to the access site. The catheter was advanced through the peel-away sheath. The sheath was then removed. At this point the prior tunneled catheter was removed from the pleural space. Final catheter positioning was confirmed with a fluoroscopic spot image. Both access incision sites were closed with Derma bond after chlorhexidine wash was again applied. Dermabond was applied to the catheterization incision. Approximately 400 cc of fluid performed via thoracentesis through the new catheter utilizing vacuum bottle The patient tolerated the procedure well and remained hemodynamically stable throughout. No complications were encountered and no significant blood loss was encountered. FINDINGS: Ultrasound demonstrates complex fluid within the dependent aspects of the right pleural space indicating proteinaceous/loculated fluid. Inspissated blood and debris found within the remote tunneled catheter occupying approximately 50% of the length of the lumen of the catheter 400 cc of amber fluid removed through the current catheter with the final image demonstrating residual  scarring/complex fluid/loculated fluid at the right lung base. The new tunneled pleural catheter appears subpleural. IMPRESSION: Status post placement of a new PleurX catheter/tunneled catheter into the right residual pleural fluid, with removal of the prior tunneled PleurX catheter. Signed, Dulcy Fanny. Dellia Nims, RPVI [facility] Electronically Signed   By: Corrie Mckusick D.O.   On: 01/31/2019 10:18   Dg Chest Port 1 View  Result Date: 01/31/2019 CLINICAL DATA:  Preop film for drainage of lung. EXAM: PORTABLE CHEST 1 VIEW COMPARISON:  January 06, 2019 FINDINGS: The heart size and mediastinal contours are stable. Moderate right pleural effusion with consolidation of the right mid and lower lung are unchanged. Consolidation of the right perihilar lung is identified unchanged. Right chest tube is unchanged. The visualized skeletal structures are stable. IMPRESSION: Moderate right pleural effusion with consolidation of the right mid and lower lung are unchanged. Consolidation of the right perihilar lung is unchanged. Electronically Signed   By: Abelardo Diesel M.D.   On: 01/31/2019 09:34   Dg Shoulder Left  Result  Date: 01/30/2019 CLINICAL DATA:  73 year old who fell and injured the LEFT shoulder earlier today. Patient currently unable to abduct the arm. Initial encounter. EXAM: LEFT SHOULDER - 2+ VIEW COMPARISON:  None. FINDINGS: Acute impacted fracture involving the humeral head and neck. An apparent free fragment is present which includes the greater tuberosity. Glenohumeral joint anatomically aligned with well-preserved joint space. Acromioclavicular joint anatomically aligned with only mild degenerative changes. Port-A-Cath port overlies the lower shoulder joint on the AP image. IMPRESSION: 1. Acute traumatic impacted fracture involving the humeral head and neck. 2. Apparent free fragment which includes the greater tuberosity. Electronically Signed   By:  Evangeline Dakin M.D.   On: 01/30/2019 14:00   Ir Instill Via Ch/tube Fibrinolysis Ini  Result Date: 01/23/2019 INDICATION: Patient with history of lung cancer with recurrent malignant right pleural effusion s/p right PleurX catheter placement 11/15/2018 by Dr. Annamaria Boots. He presented [facility] ED 01/06/2019 due to poor output from PleurX/increased dyspnea- subsequently underwent tPA dwell same day which initially improved output. He then presented to [facility] IR 01/10/2019 for PleurX evaluation with same complaints (decreased output/increased symptoms)- approximately 200 cc pleural fluid removed. Case discussed with Dr. Vernard Gambles who recommended CT chest for possible PleurX revision. CT chest 01/16/2019 revealed PleurX in good position. Patient presented to [facility] IR today for again PleurX evaluation with same complaints- decreased output/increased dyspnea. Moderately large persistent loculated right effusion. EXAM: RIGHT PLEURX CATHETER EVALUATION MEDICATIONS: None. ANESTHESIA/SEDATION: None. COMPLICATIONS: None immediate. PROCEDURE: Right PleurX catheter dressed appropriately, insertion site clean, dry, and intact. Initial connection of PleurX to drainage system drained minimal output of dark red pleural fluid. A limited chest ultrasound was performed which showed a large amount of residual pleural fluid on the right side with PleurX catheter visualized in pleural space. Case was discussed with Dr. Vernard Gambles who recommended tPA dwell. 6 mg tPA was mixed in 30 cc NS and injected into right PleurX catheter at approximately 1505. This mixture was removed from right PleurX catheter at approximately 1705 (2 hour dwell). During dwell, patient was asked to switch positions (sitting, laying on back, laying on left/right side) to allow for maximum tPA effects. Following removal of tPA mixture from drainage catheter, right PleurX was connected to drainage system and 900 mL of dark red fluid was removed. A limited chest ultrasound was preformed  which showed little to no residual pleural fluid on right side. IMPRESSION: Successful right PleurX catheter tPA dwell with subsequent removal of 900 mL of pleural fluid via right PleurX catheter. Patient advised to call our office or return to ED if symptoms return. Read by: Earley Abide, PA-C Electronically Signed   By: Lucrezia Europe M.D.   On: 01/23/2019 17:37   Ir US Chest  Result Date: 01/23/2019 INDICATION: Patient with history of lung cancer with recurrent malignant right pleural effusion s/p right PleurX catheter placement 11/15/2018 by Dr. Annamaria Boots. He presented to [facility] ED 01/06/2019 due to poor output from PleurX/increased dyspnea- subsequently underwent tPA dwell same day which initially improved output. He then presented to [facility] IR 01/10/2019 for PleurX evaluation with same complaints (decreased output/increased symptoms)- approximately 200 cc pleural fluid removed. Case discussed with Dr. Vernard Gambles who recommended CT chest for possible PleurX revision. CT chest 01/16/2019 revealed PleurX in good position.  Patient presented to [facility] IR today for again PleurX evaluation with same complaints- decreased output/increased dyspnea. Moderately large persistent loculated right effusion.  EXAM: RIGHT PLEURX CATHETER EVALUATION  MEDICATIONS: None.  ANESTHESIA/SEDATION: None.  COMPLICATIONS: None immediate.  PROCEDURE: Right PleurX catheter dressed appropriately, insertion site clean, dry, and intact. Initial connection of PleurX to drainage system drained minimal output of dark red pleural fluid. A limited chest ultrasound was performed which showed a large amount of residual pleural fluid on the right side with PleurX catheter visualized in pleural space.  Case was discussed with Dr. Vernard Gambles who recommended tPA dwell. 6 mg tPA was mixed in 30 cc NS and injected into right PleurX catheter at approximately 1505. This mixture was removed from right PleurX catheter at approximately 1705 (2 hour dwell). During dwell,  patient was asked to switch positions (sitting, laying on back, laying on left/right side) to allow for maximum tPA effects. Following removal of tPA mixture from drainage catheter, right PleurX was connected to drainage system and 900 mL of dark red fluid was removed. A limited chest ultrasound was preformed which showed little to no residual pleural fluid on right side.  IMPRESSION: Successful right PleurX catheter tPA dwell with subsequent removal of 900 mL of pleural fluid via right PleurX catheter.  Patient advised to call our office or return to ED if symptoms return.  Read by: Earley Abide, PA-C   Electronically Signed   By: Lucrezia Europe M.D.   On: 01/23/2019 17:37   Ir US Chest  Result Date: 01/10/2019 CLINICAL DATA:  Patient with history of right lung cancer and recurrent right pleural effusion s/p right Pleurx placement 11/15/18. Patient presented to the ED on 01/06/19 due to complaints dyspnea and little to no output from Pleurx for previous 2 weeks - imaging during this visit showed a well-positioned Pleurx catheter and tPA dwell was performed. After tPA dwell a total of 950 cc of pleural fluid was drained and patient's symptoms resolved. He presents today for evaluation of his Pleurx catheter due to decreased output and rate of flow. He reports that he typically drains approximately 1 liter of fluid every other day however the last time he successfully used the Pleurx was 2 days ago when 700 cc was drained. He reports that the fluid came out very slow and sporadically and took approximately 45 minutes to drain. He denies any dyspnea, chest pain, cough or leakage of fluids from insertion site. EXAM: Right Pleurx dressed appropriately, insertion site clean, dry and intact. Connected Pleurx to drainage system and ultimately drained 200 cc of golden pleural fluid over the course of approximately 1 hour. A limited chest ultrasound was performed which showed a large amount of residual pleural fluid on  the right. The Pleurx catheter was visualized in the pleural space. FINDINGS: Patient with decreased output from right Pleurx s/p tPA dwell 01/06/19 in the ED which initially improved output, however on exam today only 200 cc of pleural fluid was removed with large pleural effusion per ultrasound remaining. IMPRESSION: Patient discussed today with Dr. Vernard Gambles who recommends obtaining an outpatient non-contrast CT chest to evaluate tubing position and scheduling patient for Pleurx revision. Patient is aware of and agrees to plan, he understands to call our office or return to the ED if he experiences worsening symptoms. Read by Candiss Norse, PA-C Electronically Signed   By: Lucrezia Europe M.D.   On: 01/10/2019 16:35   Ir Removal Of Plural Cath W/cuff  Result Date: 01/31/2019 INDICATION: 73 year old male with a history of malignant right-sided pleural effusion with nonfunctioning existing PleurX catheter which was placed Nov 15, 2018 EXAM: IMAGE GUIDED PLACEMENT OF A NEW RIGHT-SIDED PLEURAL TUNNELED CATHETER AND REMOVAL OF THE PRIOR CATHETER MEDICATIONS: 2  G ANCEF the antibiotics were administered within an appropriate time frame prior to the initiation of the procedure. ANESTHESIA/SEDATION: Fentanyl 50 mcg IV; Versed 1.0 mg IV Moderate Sedation Time:  20 MINUTES The patient was continuously monitored during the procedure by the interventional radiology nurse under my direct supervision. COMPLICATIONS: None PROCEDURE: The procedure, risks, benefits, and alternatives were explained to the patient and the patient's family. Specific risks that were addressed included bleeding, infection, pneumothorax, need for further procedure, chance of delayed pneumothorax or hemorrhage, hemoptysis, cardiopulmonary collapse, death. Questions regarding the procedure were encouraged and answered. The patient understands and consents to the procedure. The right chest wall and the indwelling catheter was prepped with chlorhexidine in a  sterile fashion, and a sterile drape was applied covering the operative field. A sterile gown and sterile gloves were used for the procedure. Local anesthesia was provided with 1% Lidocaine. Ultrasound image documentation was performed. After creating a small skin incision, a 19 gauge needle was advanced into the pleural cavity/pleural fluid under ultrasound guidance. A guide wire was then advanced under fluoroscopy into the pleural space. Pleural access was dilated serially and a 16-French peel-away sheath placed. The skin and subcutaneous tissues were generously infiltrated with 1% lidocaine from the puncture site over the pleura along the intercostal margin anteriorly. A small stab incision was made with 11 blade scalpel at the insertion site of the catheter, and the catheter was back tunneled to the site at the pleural puncture. A tunneled CareFusion Pleurex catheter was placed. This was tunneled from the incision 5 cm anterior to the pleural access to the access site. The catheter was advanced through the peel-away sheath. The sheath was then removed. At this point the prior tunneled catheter was removed from the pleural space. Final catheter positioning was confirmed with a fluoroscopic spot image. Both access incision sites were closed with Derma bond after chlorhexidine wash was again applied. Dermabond was applied to the catheterization incision. Approximately 400 cc of fluid performed via thoracentesis through the new catheter utilizing vacuum bottle The patient tolerated the procedure well and remained hemodynamically stable throughout. No complications were encountered and no significant blood loss was encountered. FINDINGS: Ultrasound demonstrates complex fluid within the dependent aspects of the right pleural space indicating proteinaceous/loculated fluid. Inspissated blood and debris found within the remote tunneled catheter occupying approximately 50% of the length of the lumen of the catheter 400 cc  of amber fluid removed through the current catheter with the final image demonstrating residual scarring/complex fluid/loculated fluid at the right lung base. The new tunneled pleural catheter appears subpleural. IMPRESSION: Status post placement of a new PleurX catheter/tunneled catheter into the right residual pleural fluid, with removal of the prior tunneled PleurX catheter. Signed, Dulcy Fanny. Dellia Nims, RPVI [facility] Radiology Electronically Signed   By: Corrie Mckusick D.O.   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 The patient is currently undergoing a course of concurrent chemoradiation with weekly carboplatin and paclitaxel status post 5 cycles.  He had partial response after the initial induction treatment. The patient was started on treatment with consolidation Imfinzi status post 16 cycles.  He has been tolerating this treatment well with no concerning adverse effects. I recommended for him to proceed with cycle #17 today as planned. For the recurrent right pleural effusion, he will  continue with drainage via the Pleurx catheter. For the left shoulder fracture, he is managed conservatively by orthopedic surgery. The patient will come back for follow-up visit in 2 weeks for evaluation before starting cycle #18. He was advised to call immediately if he has any concerning symptoms in the interval. The patient voices understanding of current disease status and treatment options and is in agreement with the current care plan. All questions were answered. The patient knows to call the clinic with any problems, questions or concerns. We can certainly see the patient much sooner if necessary.  Disclaimer: This note was dictated with voice recognition software. Similar sounding words can inadvertently  be transcribed and may not be corrected upon review.

## 2019-02-05 NOTE — Patient Instructions (Signed)
[facility] Discharge Instructions for Patients Receiving Chemotherapy  Today you received the following chemotherapy agents Durvalumab (IMFINZI).  To help prevent nausea and vomiting after your treatment, we encourage you to take your nausea medication as prescribed.  If you develop nausea and vomiting that is not controlled by your nausea medication, call the clinic.   BELOW ARE SYMPTOMS THAT SHOULD BE REPORTED IMMEDIATELY:  *FEVER GREATER THAN 100.5 F  *CHILLS WITH OR WITHOUT FEVER  NAUSEA AND VOMITING THAT IS NOT CONTROLLED WITH YOUR NAUSEA MEDICATION  *UNUSUAL SHORTNESS OF BREATH  *UNUSUAL BRUISING OR BLEEDING  TENDERNESS IN MOUTH AND THROAT WITH OR WITHOUT PRESENCE OF ULCERS  *URINARY PROBLEMS  *BOWEL PROBLEMS  UNUSUAL RASH Items with * indicate a potential emergency and should be followed up as soon as possible.  Feel free to call the clinic should you have any questions or concerns. The clinic phone number is [phone].  Please show the [facility] at check-in to the Emergency Department and triage nurse.  Coronavirus (COVID-19) Are you at risk?  Are you at risk for the Coronavirus (COVID-19)?  To be considered HIGH RISK for Coronavirus (COVID-19), you have to meet the following criteria:  . Traveled to [place], [place], [place], [place] or [place]; or in the [place] to [place], [place], [place], or [place]; and have fever, cough, and shortness of breath within the last 2 weeks of travel OR . Been in close contact with a person diagnosed with COVID-19 within the last 2 weeks and have fever, cough, and shortness of breath . IF YOU DO NOT MEET THESE CRITERIA, YOU ARE CONSIDERED LOW RISK FOR COVID-19.  What to do if you are HIGH RISK for COVID-19?  Marland Kitchen If you are having a medical emergency, call 911. . Seek medical care right away. Before you go to a doctor's office, urgent care or emergency department, call ahead and tell them  about your recent travel, contact with someone diagnosed with COVID-19, and your symptoms. You should receive instructions from your physician's office regarding next steps of care.  . When you arrive at healthcare provider, tell the healthcare staff immediately you have returned from visiting [place], [place], [place], [place] or [place]; or traveled in the [place] to [place], [place], [place], or [place]; in the last two weeks or you have been in close contact with a person diagnosed with COVID-19 in the last 2 weeks.   . Tell the health care staff about your symptoms: fever, cough and shortness of breath. . After you have been seen by a medical provider, you will be either: o Tested for (COVID-19) and discharged home on quarantine except to seek medical care if symptoms worsen, and asked to  - Stay home and avoid contact with others until you get your results (4-5 days)  - Avoid travel on public transportation if possible (such as bus, train, or airplane) or o Sent to the Emergency Department by EMS for evaluation, COVID-19 testing, and possible admission depending on your condition and test results.  What to do if you are LOW RISK for COVID-19?  Reduce your risk of any infection by using the same precautions used for avoiding the common cold or flu:  Marland Kitchen Wash your hands often with soap and warm water for at least 20 seconds.  If soap and water are not readily available, use an alcohol-based hand sanitizer with at least 60% alcohol.  . If coughing or sneezing,  cover your mouth and nose by coughing or sneezing into the elbow areas of your shirt or coat, into a tissue or into your sleeve (not your hands). . Avoid shaking hands with others and consider head nods or verbal greetings only. . Avoid touching your eyes, nose, or mouth with unwashed hands.  . Avoid close contact with people who are sick. . Avoid places or events with large numbers of people in one location, like concerts or  sporting events. . Carefully consider travel plans you have or are making. . If you are planning any travel outside or inside the [place], visit the CDC's Travelers' Health webpage for the latest health notices. . If you have some symptoms but not all symptoms, continue to monitor at home and seek medical attention if your symptoms worsen. . If you are having a medical emergency, call 911.   [facility] / e-Visit: [email or web address]         [facility]: [facility]: [phone]                   [facility]: [phone]

## 2019-02-05 NOTE — Progress Notes (Signed)
Verbal order from Dr. [name]: okay to treat patient with Scr. level of 1.54.

## 2019-02-14 ENCOUNTER — Telehealth: Payer: Self-pay | Admitting: Orthopaedic Surgery

## 2019-02-14 ENCOUNTER — Other Ambulatory Visit: Payer: Self-pay | Admitting: Orthopaedic Surgery

## 2019-02-14 MED ORDER — OXYCODONE-ACETAMINOPHEN 5-325 MG PO TABS: 1.0000 | ORAL_TABLET | ORAL | 0 refills | Status: DC | PRN

## 2019-02-14 NOTE — Telephone Encounter (Signed)
I called patient's wife and advised.

## 2019-02-14 NOTE — Telephone Encounter (Signed)
Please advise 

## 2019-02-14 NOTE — Telephone Encounter (Signed)
I sent in  # 20 more tabs please notify thanks

## 2019-02-14 NOTE — Telephone Encounter (Signed)
Patient's wife Blair Promise called advised patient need Rx refilled (Oxycodone) The number to contact Blair Promise is [phone]

## 2019-02-19 ENCOUNTER — Telehealth: Payer: Self-pay | Admitting: Internal Medicine

## 2019-02-19 ENCOUNTER — Inpatient Hospital Stay: Payer: Medicare Other

## 2019-02-19 ENCOUNTER — Other Ambulatory Visit: Payer: Self-pay

## 2019-02-19 ENCOUNTER — Inpatient Hospital Stay: Payer: Medicare Other | Attending: Internal Medicine | Admitting: Internal Medicine

## 2019-02-19 ENCOUNTER — Encounter: Payer: Self-pay | Admitting: Internal Medicine

## 2019-02-19 ENCOUNTER — Other Ambulatory Visit: Payer: Self-pay | Admitting: *Deleted

## 2019-02-19 VITALS — BP 103/59 | HR 64 | Temp 97.8°F | Resp 17 | Ht 69.0 in | Wt 277.8 lb

## 2019-02-19 DIAGNOSIS — Z5112 Encounter for antineoplastic immunotherapy: Secondary | ICD-10-CM | POA: Diagnosis not present

## 2019-02-19 DIAGNOSIS — Z95828 Presence of other vascular implants and grafts: Secondary | ICD-10-CM

## 2019-02-19 DIAGNOSIS — C3411 Malignant neoplasm of upper lobe, right bronchus or lung: Secondary | ICD-10-CM

## 2019-02-19 DIAGNOSIS — R0602 Shortness of breath: Secondary | ICD-10-CM | POA: Diagnosis not present

## 2019-02-19 DIAGNOSIS — I130 Hypertensive heart and chronic kidney disease with heart failure and stage 1 through stage 4 chronic kidney disease, or unspecified chronic kidney disease: Secondary | ICD-10-CM | POA: Diagnosis not present

## 2019-02-19 LAB — CBC WITH DIFFERENTIAL (CANCER CENTER ONLY)
Abs Immature Granulocytes: 0.02 10*3/uL (ref 0.00–0.07)
Basophils Absolute: 0 10*3/uL (ref 0.0–0.1)
Basophils Relative: 1 %
Eosinophils Absolute: 0.2 10*3/uL (ref 0.0–0.5)
Eosinophils Relative: 8 %
HCT: 27.2 % — ABNORMAL LOW (ref 39.0–52.0)
Hemoglobin: 8.8 g/dL — ABNORMAL LOW (ref 13.0–17.0)
Immature Granulocytes: 1 %
Lymphocytes Relative: 10 %
Lymphs Abs: 0.3 10*3/uL — ABNORMAL LOW (ref 0.7–4.0)
MCH: 29.3 pg (ref 26.0–34.0)
MCHC: 32.4 g/dL (ref 30.0–36.0)
MCV: 90.7 fL (ref 80.0–100.0)
Monocytes Absolute: 0.3 10*3/uL (ref 0.1–1.0)
Monocytes Relative: 10 %
Neutro Abs: 2.2 10*3/uL (ref 1.7–7.7)
Neutrophils Relative %: 70 %
Platelet Count: 163 10*3/uL (ref 150–400)
RBC: 3 MIL/uL — ABNORMAL LOW (ref 4.22–5.81)
RDW: 18 % — ABNORMAL HIGH (ref 11.5–15.5)
WBC Count: 3.1 10*3/uL — ABNORMAL LOW (ref 4.0–10.5)
nRBC: 0 % (ref 0.0–0.2)

## 2019-02-19 LAB — CMP (CANCER CENTER ONLY)
ALT: 12 U/L (ref 0–44)
AST: 23 U/L (ref 15–41)
Albumin: 3.3 g/dL — ABNORMAL LOW (ref 3.5–5.0)
Alkaline Phosphatase: 48 U/L (ref 38–126)
Anion gap: 12 (ref 5–15)
BUN: 44 mg/dL — ABNORMAL HIGH (ref 8–23)
CO2: 23 mmol/L (ref 22–32)
Calcium: 9.6 mg/dL (ref 8.9–10.3)
Chloride: 104 mmol/L (ref 98–111)
Creatinine: 1.94 mg/dL — ABNORMAL HIGH (ref 0.61–1.24)
GFR, Est AFR Am: 39 mL/min — ABNORMAL LOW (ref >60)
GFR, Estimated: 34 mL/min — ABNORMAL LOW (ref >60)
Glucose, Bld: 131 mg/dL — ABNORMAL HIGH (ref 70–99)
Potassium: 3.8 mmol/L (ref 3.5–5.1)
Sodium: 139 mmol/L (ref 135–145)
Total Bilirubin: 0.7 mg/dL (ref 0.3–1.2)
Total Protein: 6.9 g/dL (ref 6.5–8.1)

## 2019-02-19 MED ORDER — FUROSEMIDE 20 MG PO TABS: ORAL_TABLET | ORAL | 0 refills | Status: DC

## 2019-02-19 MED ORDER — SODIUM CHLORIDE 0.9 % IV SOLN
10.0000 mg/kg | Freq: Once | INTRAVENOUS | Status: AC
  Administered 2019-02-19: 1120 mg via INTRAVENOUS
  Filled 2019-02-19: qty 20

## 2019-02-19 MED ORDER — SODIUM CHLORIDE 0.9% FLUSH
10.0000 mL | INTRAVENOUS | Status: DC | PRN
  Administered 2019-02-19: 10 mL
  Filled 2019-02-19: qty 10

## 2019-02-19 MED ORDER — HEPARIN SOD (PORK) LOCK FLUSH 100 UNIT/ML IV SOLN
500.0000 [IU] | Freq: Once | INTRAVENOUS | Status: AC | PRN
  Administered 2019-02-19: 500 [IU]
  Filled 2019-02-19: qty 5

## 2019-02-19 MED ORDER — SODIUM CHLORIDE 0.9 % IV SOLN
Freq: Once | INTRAVENOUS | Status: AC
  Administered 2019-02-19: 10:00:00 via INTRAVENOUS
  Filled 2019-02-19: qty 250

## 2019-02-19 NOTE — Telephone Encounter (Signed)
Scheduled appt per 8/10 los - gave patient AVS and calender per los.

## 2019-02-19 NOTE — Patient Instructions (Signed)
[facility] Discharge Instructions for Patients Receiving Chemotherapy  Today you received the following chemotherapy agents Durvalumab (IMFINZI).  To help prevent nausea and vomiting after your treatment, we encourage you to take your nausea medication as prescribed.   If you develop nausea and vomiting that is not controlled by your nausea medication, call the clinic.   BELOW ARE SYMPTOMS THAT SHOULD BE REPORTED IMMEDIATELY:  *FEVER GREATER THAN 100.5 F  *CHILLS WITH OR WITHOUT FEVER  NAUSEA AND VOMITING THAT IS NOT CONTROLLED WITH YOUR NAUSEA MEDICATION  *UNUSUAL SHORTNESS OF BREATH  *UNUSUAL BRUISING OR BLEEDING  TENDERNESS IN MOUTH AND THROAT WITH OR WITHOUT PRESENCE OF ULCERS  *URINARY PROBLEMS  *BOWEL PROBLEMS  UNUSUAL RASH Items with * indicate a potential emergency and should be followed up as soon as possible.  Feel free to call the clinic should you have any questions or concerns. The clinic phone number is [phone].  Please show the [facility] at check-in to the Emergency Department and triage nurse.  Coronavirus (COVID-19) Are you at risk?  Are you at risk for the Coronavirus (COVID-19)?  To be considered HIGH RISK for Coronavirus (COVID-19), you have to meet the following criteria:  . Traveled to [place], [place], [place], [place] or [place]; or in the [place] to [place], [place], [place], or [place]; and have fever, cough, and shortness of breath within the last 2 weeks of travel OR . Been in close contact with a person diagnosed with COVID-19 within the last 2 weeks and have fever, cough, and shortness of breath . IF YOU DO NOT MEET THESE CRITERIA, YOU ARE CONSIDERED LOW RISK FOR COVID-19.  What to do if you are HIGH RISK for COVID-19?  Marland Kitchen If you are having a medical emergency, call 911. . Seek medical care right away. Before you go to a doctor's office, urgent care or emergency department, call ahead and tell them  about your recent travel, contact with someone diagnosed with COVID-19, and your symptoms. You should receive instructions from your physician's office regarding next steps of care.  . When you arrive at healthcare provider, tell the healthcare staff immediately you have returned from visiting [place], [place], [place], [place] or [place]; or traveled in the [place] to [place], [place], [place], or [place]; in the last two weeks or you have been in close contact with a person diagnosed with COVID-19 in the last 2 weeks.   . Tell the health care staff about your symptoms: fever, cough and shortness of breath. . After you have been seen by a medical provider, you will be either: o Tested for (COVID-19) and discharged home on quarantine except to seek medical care if symptoms worsen, and asked to  - Stay home and avoid contact with others until you get your results (4-5 days)  - Avoid travel on public transportation if possible (such as bus, train, or airplane) or o Sent to the Emergency Department by EMS for evaluation, COVID-19 testing, and possible admission depending on your condition and test results.  What to do if you are LOW RISK for COVID-19?  Reduce your risk of any infection by using the same precautions used for avoiding the common cold or flu:  Marland Kitchen Wash your hands often with soap and warm water for at least 20 seconds.  If soap and water are not readily available, use an alcohol-based hand sanitizer with at least 60% alcohol.  . If coughing or  sneezing, cover your mouth and nose by coughing or sneezing into the elbow areas of your shirt or coat, into a tissue or into your sleeve (not your hands). . Avoid shaking hands with others and consider head nods or verbal greetings only. . Avoid touching your eyes, nose, or mouth with unwashed hands.  . Avoid close contact with people who are sick. . Avoid places or events with large numbers of people in one location, like concerts or  sporting events. . Carefully consider travel plans you have or are making. . If you are planning any travel outside or inside the [place], visit the CDC's Travelers' Health webpage for the latest health notices. . If you have some symptoms but not all symptoms, continue to monitor at home and seek medical attention if your symptoms worsen. . If you are having a medical emergency, call 911.   [facility] / e-Visit: [email or web address]         [facility]: [facility]: [phone]                   [facility]: [phone]

## 2019-02-19 NOTE — Progress Notes (Signed)
Verbal order per Dr. [name]: okay to treat patient with current Scr. Level of 1.94.

## 2019-02-19 NOTE — Progress Notes (Signed)
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 First dose 02/27/2018.Status post 5 cycles.  CURRENT THERAPY:Consolidationimmunotherapy with Imfinzi 10 mg/kg every 2 weeks.First dose given on 05/17/2018.Status post 17 cycles.  INTERVAL HISTORY: Darrell [name] 73 y.o. male returns to the clinic today for follow-up visit.  His wife was available by phone during the visit.  The patient continues to complain of increasing fatigue and weakness.  He gained around 6 pounds since his last visit likely secondary to fluid retention.  He denied having any chest pain but continues to have shortness of breath at baseline increased with exertion.  He has no nausea, vomiting, diarrhea or constipation.  Has some itching.  He is here today for evaluation before starting cycle #18 of his treatment.   MEDICAL HISTORY: Past Medical History:  Diagnosis Date   Aortic atherosclerosis ([place]) 07/18/2018   Cancer ([place])    Cellulitis of right lower extremity    Chronic kidney disease    Diabetes mellitus without complication ([place])    Dyslipidemia    Hypertension    Thoracic aortic aneurysm without rupture ([place]) 07/18/2018    ALLERGIES:  is allergic to lisinopril.  MEDICATIONS:  Current Outpatient Medications  Medication Sig Dispense Refill   albuterol (PROVENTIL HFA;VENTOLIN HFA) 108 (90 Base) MCG/ACT inhaler Inhale 2 puffs into the lungs every 4 (four) hours as needed for wheezing or shortness of breath. 1 Inhaler 5   carvedilol (COREG) 6.25 MG tablet Take 6.25 mg by mouth 2  (two) times daily with a meal.      cloNIDine (CATAPRES) 0.1 MG tablet Take 0.1 mg by mouth 2 (two) times daily.      fenofibrate 160 MG tablet Take 160 mg by mouth daily.      hydrochlorothiazide (HYDRODIURIL) 25 MG tablet Take 25 mg by mouth daily.      Insulin Detemir (LEVEMIR FLEXTOUCH) 100 UNIT/ML Pen Inject 10 Units into the skin daily.      Insulin Pen Needle (FIFTY50 PEN NEEDLES) 31G X 8 MM MISC USE AS DIRECTED     lidocaine-prilocaine (EMLA) cream Apply 1 application topically as needed. (Patient taking differently: Apply 1 application topically as needed (port). ) 30 g 0   losartan (COZAAR) 50 MG tablet Take 50 mg by mouth daily.      magnesium oxide (MAG-OX) 400 MG tablet Take 400 mg by mouth daily.     metFORMIN (GLUCOPHAGE) 1000 MG tablet Take 1,000 mg by mouth 2 (two) times daily with a meal.      Multiple Vitamin (MULTI-VITAMINS) TABS Take 1 tablet by mouth daily.      Omega-3 1000 MG CAPS Take 1,200 mg by mouth 2 (two) times daily.      oxyCODONE-acetaminophen (PERCOCET/ROXICET) 5-325 MG tablet Take 1 tablet by mouth every 6 (six) hours as needed for severe pain. (Patient not taking: Reported on 02/02/2019) 8 tablet 0   oxyCODONE-acetaminophen (PERCOCET/ROXICET) 5-325 MG tablet Take 1 tablet by mouth every 6 (six) hours as needed for severe pain. 30 tablet 0   oxyCODONE-acetaminophen (PERCOCET/ROXICET) 5-325 MG tablet Take 1 tablet by mouth every 4 (four)  hours as needed for severe pain. 20 tablet 0   pioglitazone (ACTOS) 45 MG tablet Take 45 mg by mouth daily.      pravastatin (PRAVACHOL) 40 MG tablet Take 40 mg by mouth daily.      prochlorperazine (COMPAZINE) 10 MG tablet TAKE 1 TABLET BY MOUTH EVERY 6 HOURS AS NEEDED FOR NAUSEA OR VOMITING 385 tablet 0   traMADol (ULTRAM) 50 MG tablet Take 1 tablet (50 mg total) by mouth every 6 (six) hours as needed for severe pain. 20 tablet 0   No current facility-administered medications for this visit.     Facility-Administered Medications Ordered in Other Visits  Medication Dose Route Frequency Provider Last Rate Last Dose   sodium chloride flush (NS) 0.9 % injection 10 mL  10 mL Intracatheter PRN Curt Bears, MD   10 mL at 06/28/18 1501   sodium chloride flush (NS) 0.9 % injection 10 mL  10 mL Intracatheter PRN Curt Bears, MD   10 mL at 02/19/19 0845    SURGICAL HISTORY:  Past Surgical History:  Procedure Laterality Date   IR GUIDED DRAIN W CATHETER PLACEMENT  11/15/2018   IR GUIDED DRAIN W CATHETER PLACEMENT  01/31/2019   IR INSTILL VIA CHEST TUBE AGENT FOR FIBRINOLYSIS INI DAY  01/23/2019   IR REMOVAL OF PLURAL CATH W/CUFF  01/31/2019   IR THORACENTESIS ASP PLEURAL SPACE W/IMG GUIDE  09/05/2018   IR THORACENTESIS ASP PLEURAL SPACE W/IMG GUIDE  10/11/2018   IR THORACENTESIS ASP PLEURAL SPACE W/IMG GUIDE  10/30/2018   PORTACATH PLACEMENT Left 02/21/2018   Procedure: INSERTION PORT-A-CATH;  Surgeon: Grace Isaac, MD;  Location: [facility];  Service: Thoracic;  Laterality: Left;    REVIEW OF SYSTEMS:  A comprehensive review of systems was negative except for: Constitutional: positive for fatigue Respiratory: positive for dyspnea on exertion Integument/breast: positive for pruritus   PHYSICAL EXAMINATION: General appearance: alert, cooperative, fatigued and no distress Head: Normocephalic, without obvious abnormality, atraumatic Neck: no adenopathy, no JVD, supple, symmetrical, trachea midline and thyroid not enlarged, symmetric, no tenderness/mass/nodules Lymph nodes: Cervical, supraclavicular, and axillary nodes normal. Resp: clear to auscultation bilaterally Back: symmetric, no curvature. ROM normal. No CVA tenderness. Cardio: regular rate and rhythm, S1, S2 normal, no murmur, click, rub or gallop GI: soft, non-tender; bowel sounds normal; no masses,  no organomegaly Extremities: edema 2 + edema bilaterally.  ECOG PERFORMANCE STATUS: 1 - Symptomatic but completely  ambulatory  Blood pressure (!) 103/59, pulse 64, temperature 97.8 F (36.6 C), temperature source Oral, resp. rate 17, height 5\' 9"  (1.753 m), weight 277 lb 12.8 oz (126 kg), SpO2 100 %.  LABORATORY DATA: Lab Results  Component Value Date   WBC 3.0 (L) 02/05/2019   HGB 9.0 (L) 02/05/2019   HCT 27.8 (L) 02/05/2019   MCV 89.7 02/05/2019   PLT 198 02/05/2019      Chemistry      Component Value Date/Time   NA 136 02/05/2019 0823   K 3.8 02/05/2019 0823   CL 102 02/05/2019 0823   CO2 24 02/05/2019 0823   BUN 31 (H) 02/05/2019 0823   CREATININE 1.54 (H) 02/05/2019 0823      Component Value Date/Time   CALCIUM 9.7 02/05/2019 0823   ALKPHOS 40 02/05/2019 0823   AST 18 02/05/2019 0823   ALT 10 02/05/2019 0823   BILITOT 0.7 02/05/2019 0823       RADIOGRAPHIC STUDIES: Ir Lenise Arena W Catheter Placement  Result Date: 01/31/2019 INDICATION: 73 year old male with  a history of malignant right-sided pleural effusion with nonfunctioning existing PleurX catheter which was placed Nov 15, 2018 EXAM: IMAGE GUIDED PLACEMENT OF A NEW RIGHT-SIDED PLEURAL TUNNELED CATHETER AND REMOVAL OF THE PRIOR CATHETER MEDICATIONS: 2 G ANCEF the antibiotics were administered within an appropriate time frame prior to the initiation of the procedure. ANESTHESIA/SEDATION: Fentanyl 50 mcg IV; Versed 1.0 mg IV Moderate Sedation Time:  20 MINUTES The patient was continuously monitored during the procedure by the interventional radiology nurse under my direct supervision. COMPLICATIONS: None PROCEDURE: The procedure, risks, benefits, and alternatives were explained to the patient and the patient's family. Specific risks that were addressed included bleeding, infection, pneumothorax, need for further procedure, chance of delayed pneumothorax or hemorrhage, hemoptysis, cardiopulmonary collapse, death. Questions regarding the procedure were encouraged and answered. The patient understands and consents to the procedure. The  right chest wall and the indwelling catheter was prepped with chlorhexidine in a sterile fashion, and a sterile drape was applied covering the operative field. A sterile gown and sterile gloves were used for the procedure. Local anesthesia was provided with 1% Lidocaine. Ultrasound image documentation was performed. After creating a small skin incision, a 19 gauge needle was advanced into the pleural cavity/pleural fluid under ultrasound guidance. A guide wire was then advanced under fluoroscopy into the pleural space. Pleural access was dilated serially and a 16-French peel-away sheath placed. The skin and subcutaneous tissues were generously infiltrated with 1% lidocaine from the puncture site over the pleura along the intercostal margin anteriorly. A small stab incision was made with 11 blade scalpel at the insertion site of the catheter, and the catheter was back tunneled to the site at the pleural puncture. A tunneled CareFusion Pleurex catheter was placed. This was tunneled from the incision 5 cm anterior to the pleural access to the access site. The catheter was advanced through the peel-away sheath. The sheath was then removed. At this point the prior tunneled catheter was removed from the pleural space. Final catheter positioning was confirmed with a fluoroscopic spot image. Both access incision sites were closed with Derma bond after chlorhexidine wash was again applied. Dermabond was applied to the catheterization incision. Approximately 400 cc of fluid performed via thoracentesis through the new catheter utilizing vacuum bottle The patient tolerated the procedure well and remained hemodynamically stable throughout. No complications were encountered and no significant blood loss was encountered. FINDINGS: Ultrasound demonstrates complex fluid within the dependent aspects of the right pleural space indicating proteinaceous/loculated fluid. Inspissated blood and debris found within the remote tunneled  catheter occupying approximately 50% of the length of the lumen of the catheter 400 cc of amber fluid removed through the current catheter with the final image demonstrating residual scarring/complex fluid/loculated fluid at the right lung base. The new tunneled pleural catheter appears subpleural. IMPRESSION: Status post placement of a new PleurX catheter/tunneled catheter into the right residual pleural fluid, with removal of the prior tunneled PleurX catheter. Signed, Dulcy Fanny. Dellia Nims, RPVI [facility] Radiology Electronically Signed   By: Corrie Mckusick D.O.   On: 01/31/2019 10:18   Dg Chest Port 1 View  Result Date: 01/31/2019 CLINICAL DATA:  Preop film for drainage of lung. EXAM: PORTABLE CHEST 1 VIEW COMPARISON:  January 06, 2019 FINDINGS: The heart size and mediastinal contours are stable. Moderate right pleural effusion with consolidation of the right mid and lower lung are unchanged. Consolidation of the right perihilar lung is identified unchanged. Right chest tube is unchanged. The  visualized skeletal structures are stable. IMPRESSION: Moderate right pleural effusion with consolidation of the right mid and lower lung are unchanged. Consolidation of the right perihilar lung is unchanged. Electronically Signed   By: Abelardo Diesel M.D.   On: 01/31/2019 09:34   Dg Shoulder Left  Result Date: 01/30/2019 CLINICAL DATA:  73 year old who fell and injured the LEFT shoulder earlier today. Patient currently unable to abduct the arm. Initial encounter. EXAM: LEFT SHOULDER - 2+ VIEW COMPARISON:  None. FINDINGS: Acute impacted fracture involving the humeral head and neck. An apparent free fragment is present which includes the greater tuberosity. Glenohumeral joint anatomically aligned with well-preserved joint space. Acromioclavicular joint anatomically aligned with only mild degenerative changes. Port-A-Cath port overlies the lower shoulder joint on the AP  image. IMPRESSION: 1. Acute traumatic impacted fracture involving the humeral head and neck. 2. Apparent free fragment which includes the greater tuberosity. Electronically Signed   By: Evangeline Dakin M.D.   On: 01/30/2019 14:00   Ir Instill Via Ch/tube Fibrinolysis Ini  Result Date: 01/23/2019 INDICATION: Patient with history of lung cancer with recurrent malignant right pleural effusion s/p right PleurX catheter placement 11/15/2018 by Dr. Annamaria Boots. He presented to [facility] ED 01/06/2019 due to poor output from PleurX/increased dyspnea- subsequently underwent tPA dwell same day which initially improved output. He then presented to [facility] IR 01/10/2019 for PleurX evaluation with same complaints (decreased output/increased symptoms)- approximately 200 cc pleural fluid removed. Case discussed with Dr. Vernard Gambles who recommended CT chest for possible PleurX revision. CT chest 01/16/2019 revealed PleurX in good position. Patient presented to [facility] IR today for again PleurX evaluation with same complaints- decreased output/increased dyspnea. Moderately large persistent loculated right effusion. EXAM: RIGHT PLEURX CATHETER EVALUATION MEDICATIONS: None. ANESTHESIA/SEDATION: None. COMPLICATIONS: None immediate. PROCEDURE: Right PleurX catheter dressed appropriately, insertion site clean, dry, and intact. Initial connection of PleurX to drainage system drained minimal output of dark red pleural fluid. A limited chest ultrasound was performed which showed a large amount of residual pleural fluid on the right side with PleurX catheter visualized in pleural space. Case was discussed with Dr. Vernard Gambles who recommended tPA dwell. 6 mg tPA was mixed in 30 cc NS and injected into right PleurX catheter at approximately 1505. This mixture was removed from right PleurX catheter at approximately 1705 (2 hour dwell). During dwell, patient was asked to switch positions (sitting, laying on back, laying on left/right side) to allow for maximum tPA effects.  Following removal of tPA mixture from drainage catheter, right PleurX was connected to drainage system and 900 mL of dark red fluid was removed. A limited chest ultrasound was preformed which showed little to no residual pleural fluid on right side. IMPRESSION: Successful right PleurX catheter tPA dwell with subsequent removal of 900 mL of pleural fluid via right PleurX catheter. Patient advised to call our office or return to ED if symptoms return. Read by: Earley Abide, PA-C Electronically Signed   By: Lucrezia Europe M.D.   On: 01/23/2019 17:37   Ir US Chest  Result Date: 01/23/2019 INDICATION: Patient with history of lung cancer with recurrent malignant right pleural effusion s/p right PleurX catheter placement 11/15/2018 by Dr. Annamaria Boots. He presented to [facility] ED 01/06/2019 due to poor output from PleurX/increased dyspnea- subsequently underwent tPA dwell same day which initially improved output. He then presented to [facility] IR 01/10/2019 for PleurX evaluation with same complaints (decreased output/increased symptoms)- approximately 200 cc pleural fluid removed. Case discussed with Dr. Vernard Gambles who recommended CT chest for possible  PleurX revision. CT chest 01/16/2019 revealed PleurX in good position.  Patient presented to [facility] IR today for again PleurX evaluation with same complaints- decreased output/increased dyspnea. Moderately large persistent loculated right effusion.  EXAM: RIGHT PLEURX CATHETER EVALUATION  MEDICATIONS: None.  ANESTHESIA/SEDATION: None.  COMPLICATIONS: None immediate.  PROCEDURE: Right PleurX catheter dressed appropriately, insertion site clean, dry, and intact. Initial connection of PleurX to drainage system drained minimal output of dark red pleural fluid. A limited chest ultrasound was performed which showed a large amount of residual pleural fluid on the right side with PleurX catheter visualized in pleural space.  Case was discussed with Dr. Vernard Gambles who recommended tPA dwell. 6 mg tPA was  mixed in 30 cc NS and injected into right PleurX catheter at approximately 1505. This mixture was removed from right PleurX catheter at approximately 1705 (2 hour dwell). During dwell, patient was asked to switch positions (sitting, laying on back, laying on left/right side) to allow for maximum tPA effects. Following removal of tPA mixture from drainage catheter, right PleurX was connected to drainage system and 900 mL of dark red fluid was removed. A limited chest ultrasound was preformed which showed little to no residual pleural fluid on right side.  IMPRESSION: Successful right PleurX catheter tPA dwell with subsequent removal of 900 mL of pleural fluid via right PleurX catheter.  Patient advised to call our office or return to ED if symptoms return.  Read by: Earley Abide, PA-C   Electronically Signed   By: Lucrezia Europe M.D.   On: 01/23/2019 17:37   Ir Removal Of Plural Cath W/cuff  Result Date: 01/31/2019 INDICATION: 73 year old male with a history of malignant right-sided pleural effusion with nonfunctioning existing PleurX catheter which was placed Nov 15, 2018 EXAM: IMAGE GUIDED PLACEMENT OF A NEW RIGHT-SIDED PLEURAL TUNNELED CATHETER AND REMOVAL OF THE PRIOR CATHETER MEDICATIONS: 2 G ANCEF the antibiotics were administered within an appropriate time frame prior to the initiation of the procedure. ANESTHESIA/SEDATION: Fentanyl 50 mcg IV; Versed 1.0 mg IV Moderate Sedation Time:  20 MINUTES The patient was continuously monitored during the procedure by the interventional radiology nurse under my direct supervision. COMPLICATIONS: None PROCEDURE: The procedure, risks, benefits, and alternatives were explained to the patient and the patient's family. Specific risks that were addressed included bleeding, infection, pneumothorax, need for further procedure, chance of delayed pneumothorax or hemorrhage, hemoptysis, cardiopulmonary collapse, death. Questions regarding the procedure were encouraged and  answered. The patient understands and consents to the procedure. The right chest wall and the indwelling catheter was prepped with chlorhexidine in a sterile fashion, and a sterile drape was applied covering the operative field. A sterile gown and sterile gloves were used for the procedure. Local anesthesia was provided with 1% Lidocaine. Ultrasound image documentation was performed. After creating a small skin incision, a 19 gauge needle was advanced into the pleural cavity/pleural fluid under ultrasound guidance. A guide wire was then advanced under fluoroscopy into the pleural space. Pleural access was dilated serially and a 16-French peel-away sheath placed. The skin and subcutaneous tissues were generously infiltrated with 1% lidocaine from the puncture site over the pleura along the intercostal margin anteriorly. A small stab incision was made with 11 blade scalpel at the insertion site of the catheter, and the catheter was back tunneled to the site at the pleural puncture. A tunneled CareFusion Pleurex catheter was placed. This was tunneled from the incision 5 cm anterior to the pleural access to the access site. The catheter was  advanced through the peel-away sheath. The sheath was then removed. At this point the prior tunneled catheter was removed from the pleural space. Final catheter positioning was confirmed with a fluoroscopic spot image. Both access incision sites were closed with Derma bond after chlorhexidine wash was again applied. Dermabond was applied to the catheterization incision. Approximately 400 cc of fluid performed via thoracentesis through the new catheter utilizing vacuum bottle The patient tolerated the procedure well and remained hemodynamically stable throughout. No complications were encountered and no significant blood loss was encountered. FINDINGS: Ultrasound demonstrates complex fluid within the dependent aspects of the right pleural space indicating proteinaceous/loculated  fluid. Inspissated blood and debris found within the remote tunneled catheter occupying approximately 50% of the length of the lumen of the catheter 400 cc of amber fluid removed through the current catheter with the final image demonstrating residual scarring/complex fluid/loculated fluid at the right lung base. The new tunneled pleural catheter appears subpleural. IMPRESSION: Status post placement of a new PleurX catheter/tunneled catheter into the right residual pleural fluid, with removal of the prior tunneled PleurX catheter. Signed, Dulcy Fanny. Dellia Nims, RPVI [facility] Radiology Electronically Signed   By: Corrie Mckusick D.O.   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 The patient is currently undergoing a course of concurrent chemoradiation with weekly carboplatin and paclitaxel status post 5 cycles.  He had partial response after the initial induction treatment. The patient was started on treatment with consolidation Imfinzi status post 17 cycles.  The patient has been tolerating this treatment well with no concerning complaints except for mild itching. I recommended for him to proceed with cycle #18 today as planned. For the recurrent right pleural effusion, he will continue with drainage via the Pleurx catheter.  He currently draining around 100-200 mL every other day. For the left shoulder fracture, he is managed conservatively by orthopedic surgery. For the swelling of the lower extremities, I will start the patient on Lasix 20 mg p.o. daily as needed.  He was advised to take potassium supplement. The patient will come back for follow-up visit in 2 weeks for evaluation before starting cycle #19. He was advised to call immediately if he has any concerning symptoms in the interval. The patient  voices understanding of current disease status and treatment options and is in agreement with the current care plan. All questions were answered. The patient knows to call the clinic with any problems, questions or concerns. We can certainly see the patient much sooner if necessary.  Disclaimer: This note was dictated with voice recognition software. Similar sounding words can inadvertently be transcribed and may not be corrected upon review.

## 2019-02-22 ENCOUNTER — Other Ambulatory Visit: Payer: Self-pay

## 2019-02-22 ENCOUNTER — Encounter (HOSPITAL_COMMUNITY): Payer: Self-pay | Admitting: Emergency Medicine

## 2019-02-22 ENCOUNTER — Telehealth: Payer: Self-pay | Admitting: *Deleted

## 2019-02-22 ENCOUNTER — Emergency Department (HOSPITAL_COMMUNITY): Payer: Medicare Other

## 2019-02-22 ENCOUNTER — Inpatient Hospital Stay (HOSPITAL_COMMUNITY)
Admission: EM | Admit: 2019-02-22 | Discharge: 2019-02-28 | DRG: 291 | Disposition: A | Discharge status: Home Health | Payer: Medicare Other | Attending: Internal Medicine | Admitting: Internal Medicine

## 2019-02-22 DIAGNOSIS — C3411 Malignant neoplasm of upper lobe, right bronchus or lung: Secondary | ICD-10-CM | POA: Diagnosis present

## 2019-02-22 DIAGNOSIS — Z79899 Other long term (current) drug therapy: Secondary | ICD-10-CM

## 2019-02-22 DIAGNOSIS — E876 Hypokalemia: Secondary | ICD-10-CM | POA: Diagnosis present

## 2019-02-22 DIAGNOSIS — I712 Thoracic aortic aneurysm, without rupture, unspecified: Secondary | ICD-10-CM | POA: Diagnosis present

## 2019-02-22 DIAGNOSIS — Z794 Long term (current) use of insulin: Secondary | ICD-10-CM

## 2019-02-22 DIAGNOSIS — D630 Anemia in neoplastic disease: Secondary | ICD-10-CM | POA: Diagnosis present

## 2019-02-22 DIAGNOSIS — M7989 Other specified soft tissue disorders: Secondary | ICD-10-CM

## 2019-02-22 DIAGNOSIS — S4292XD Fracture of left shoulder girdle, part unspecified, subsequent encounter for fracture with routine healing: Secondary | ICD-10-CM | POA: Diagnosis not present

## 2019-02-22 DIAGNOSIS — I5033 Acute on chronic diastolic (congestive) heart failure: Secondary | ICD-10-CM | POA: Diagnosis present

## 2019-02-22 DIAGNOSIS — M79601 Pain in right arm: Secondary | ICD-10-CM

## 2019-02-22 DIAGNOSIS — J449 Chronic obstructive pulmonary disease, unspecified: Secondary | ICD-10-CM | POA: Diagnosis present

## 2019-02-22 DIAGNOSIS — E785 Hyperlipidemia, unspecified: Secondary | ICD-10-CM | POA: Diagnosis present

## 2019-02-22 DIAGNOSIS — W19XXXD Unspecified fall, subsequent encounter: Secondary | ICD-10-CM | POA: Diagnosis present

## 2019-02-22 DIAGNOSIS — I10 Essential (primary) hypertension: Secondary | ICD-10-CM | POA: Diagnosis present

## 2019-02-22 DIAGNOSIS — R0602 Shortness of breath: Secondary | ICD-10-CM

## 2019-02-22 DIAGNOSIS — J9601 Acute respiratory failure with hypoxia: Secondary | ICD-10-CM | POA: Diagnosis present

## 2019-02-22 DIAGNOSIS — Z888 Allergy status to other drugs, medicaments and biological substances status: Secondary | ICD-10-CM

## 2019-02-22 DIAGNOSIS — Z9049 Acquired absence of other specified parts of digestive tract: Secondary | ICD-10-CM | POA: Diagnosis not present

## 2019-02-22 DIAGNOSIS — N183 Chronic kidney disease, stage 3 unspecified: Secondary | ICD-10-CM | POA: Diagnosis present

## 2019-02-22 DIAGNOSIS — C3490 Malignant neoplasm of unspecified part of unspecified bronchus or lung: Secondary | ICD-10-CM | POA: Diagnosis present

## 2019-02-22 DIAGNOSIS — I700 Atherosclerosis of aorta: Secondary | ICD-10-CM | POA: Diagnosis present

## 2019-02-22 DIAGNOSIS — Z8701 Personal history of pneumonia (recurrent): Secondary | ICD-10-CM | POA: Diagnosis not present

## 2019-02-22 DIAGNOSIS — Z87891 Personal history of nicotine dependence: Secondary | ICD-10-CM | POA: Diagnosis not present

## 2019-02-22 DIAGNOSIS — Z09 Encounter for follow-up examination after completed treatment for conditions other than malignant neoplasm: Secondary | ICD-10-CM

## 2019-02-22 DIAGNOSIS — J918 Pleural effusion in other conditions classified elsewhere: Secondary | ICD-10-CM | POA: Diagnosis present

## 2019-02-22 DIAGNOSIS — E119 Type 2 diabetes mellitus without complications: Secondary | ICD-10-CM

## 2019-02-22 DIAGNOSIS — D6481 Anemia due to antineoplastic chemotherapy: Secondary | ICD-10-CM | POA: Diagnosis present

## 2019-02-22 DIAGNOSIS — I5031 Acute diastolic (congestive) heart failure: Secondary | ICD-10-CM | POA: Diagnosis present

## 2019-02-22 DIAGNOSIS — Z20828 Contact with and (suspected) exposure to other viral communicable diseases: Secondary | ICD-10-CM | POA: Diagnosis present

## 2019-02-22 DIAGNOSIS — E1122 Type 2 diabetes mellitus with diabetic chronic kidney disease: Secondary | ICD-10-CM | POA: Diagnosis present

## 2019-02-22 DIAGNOSIS — Z8249 Family history of ischemic heart disease and other diseases of the circulatory system: Secondary | ICD-10-CM

## 2019-02-22 DIAGNOSIS — I130 Hypertensive heart and chronic kidney disease with heart failure and stage 1 through stage 4 chronic kidney disease, or unspecified chronic kidney disease: Secondary | ICD-10-CM | POA: Diagnosis present

## 2019-02-22 DIAGNOSIS — I4891 Unspecified atrial fibrillation: Secondary | ICD-10-CM | POA: Diagnosis present

## 2019-02-22 DIAGNOSIS — I509 Heart failure, unspecified: Secondary | ICD-10-CM | POA: Diagnosis not present

## 2019-02-22 DIAGNOSIS — D631 Anemia in chronic kidney disease: Secondary | ICD-10-CM | POA: Diagnosis present

## 2019-02-22 DIAGNOSIS — R0902 Hypoxemia: Secondary | ICD-10-CM

## 2019-02-22 DIAGNOSIS — Z803 Family history of malignant neoplasm of breast: Secondary | ICD-10-CM

## 2019-02-22 DIAGNOSIS — R7989 Other specified abnormal findings of blood chemistry: Secondary | ICD-10-CM

## 2019-02-22 LAB — COMPREHENSIVE METABOLIC PANEL
ALT: 17 U/L (ref 0–44)
AST: 26 U/L (ref 15–41)
Albumin: 3.3 g/dL — ABNORMAL LOW (ref 3.5–5.0)
Alkaline Phosphatase: 47 U/L (ref 38–126)
Anion gap: 8 (ref 5–15)
BUN: 50 mg/dL — ABNORMAL HIGH (ref 8–23)
CO2: 25 mmol/L (ref 22–32)
Calcium: 9.2 mg/dL (ref 8.9–10.3)
Chloride: 101 mmol/L (ref 98–111)
Creatinine, Ser: 1.94 mg/dL — ABNORMAL HIGH (ref 0.61–1.24)
GFR calc Af Amer: 39 mL/min — ABNORMAL LOW (ref >60)
GFR calc non Af Amer: 34 mL/min — ABNORMAL LOW (ref >60)
Glucose, Bld: 102 mg/dL — ABNORMAL HIGH (ref 70–99)
Potassium: 3.5 mmol/L (ref 3.5–5.1)
Sodium: 134 mmol/L — ABNORMAL LOW (ref 135–145)
Total Bilirubin: 0.9 mg/dL (ref 0.3–1.2)
Total Protein: 6.8 g/dL (ref 6.5–8.1)

## 2019-02-22 LAB — CBC WITH DIFFERENTIAL/PLATELET
Abs Immature Granulocytes: 0.01 10*3/uL (ref 0.00–0.07)
Basophils Absolute: 0 10*3/uL (ref 0.0–0.1)
Basophils Relative: 1 %
Eosinophils Absolute: 0.2 10*3/uL (ref 0.0–0.5)
Eosinophils Relative: 8 %
HCT: 27.3 % — ABNORMAL LOW (ref 39.0–52.0)
Hemoglobin: 8.6 g/dL — ABNORMAL LOW (ref 13.0–17.0)
Immature Granulocytes: 0 %
Lymphocytes Relative: 12 %
Lymphs Abs: 0.3 10*3/uL — ABNORMAL LOW (ref 0.7–4.0)
MCH: 29.2 pg (ref 26.0–34.0)
MCHC: 31.5 g/dL (ref 30.0–36.0)
MCV: 92.5 fL (ref 80.0–100.0)
Monocytes Absolute: 0.3 10*3/uL (ref 0.1–1.0)
Monocytes Relative: 9 %
Neutro Abs: 2 10*3/uL (ref 1.7–7.7)
Neutrophils Relative %: 70 %
Platelets: 164 10*3/uL (ref 150–400)
RBC: 2.95 MIL/uL — ABNORMAL LOW (ref 4.22–5.81)
RDW: 18.1 % — ABNORMAL HIGH (ref 11.5–15.5)
WBC: 2.9 10*3/uL — ABNORMAL LOW (ref 4.0–10.5)
nRBC: 0 % (ref 0.0–0.2)

## 2019-02-22 LAB — TROPONIN I (HIGH SENSITIVITY)
Troponin I (High Sensitivity): 6 ng/L (ref <18)
Troponin I (High Sensitivity): 6 ng/L (ref <18)

## 2019-02-22 LAB — LIPASE, BLOOD: Lipase: 18 U/L (ref 11–51)

## 2019-02-22 LAB — BRAIN NATRIURETIC PEPTIDE: B Natriuretic Peptide: 501.4 pg/mL — ABNORMAL HIGH (ref 0.0–100.0)

## 2019-02-22 MED ORDER — OXYCODONE-ACETAMINOPHEN 5-325 MG PO TABS
1.0000 | ORAL_TABLET | Freq: Once | ORAL | Status: AC
  Administered 2019-02-22: 1 via ORAL
  Filled 2019-02-22: qty 1

## 2019-02-22 MED ORDER — FUROSEMIDE 10 MG/ML IJ SOLN
40.0000 mg | Freq: Two times a day (BID) | INTRAMUSCULAR | Status: DC
  Administered 2019-02-23 – 2019-02-25 (×6): 40 mg via INTRAVENOUS
  Filled 2019-02-22 (×6): qty 4

## 2019-02-22 MED ORDER — FUROSEMIDE 10 MG/ML IJ SOLN: 20.0000 mg | Freq: Once | INTRAMUSCULAR | Status: DC

## 2019-02-22 MED ORDER — ALBUTEROL SULFATE (2.5 MG/3ML) 0.083% IN NEBU: 5.0000 mg | INHALATION_SOLUTION | Freq: Once | RESPIRATORY_TRACT | Status: DC

## 2019-02-22 NOTE — Telephone Encounter (Signed)
Received call from pt's wife this am.  She states that she is very concerned about her husband's breathing and overall status.  She states that he is very SOB-worse than when he was here last.  Dr. [name] started him on Lasix 20 mg on 02/19/19.  Darrell [name] states he is no better. She states his breathing sounds like he is 'rattling', he is experiencing increased edema in his legs.  She states his pleuryx tube is in tact but only draining 100-200 cc every other day.  She states he is sleeping more, very weak. She is seeking guidance on what she should do at this point. Advised her that the best thing to do is to go to the ED for evaluation.  Explained that they can do more there for him than we can in the clinic.  She voiced understanding and she will talk to him about going to the ED.   Received call back from [facility] a couple hours after initial call. She states her husband refused to go to ED at that time.  She will continue to watch him and call back with any changes. @ approx. 2:30 or so, received call from pt's wife. She states she is now taking him to the [facility].  She states she will keep [place] up to date on his status.

## 2019-02-22 NOTE — ED Provider Notes (Signed)
73 year old male received at signout from Dr. [name] pending labs.  Per his HPI:  "The history is provided by the patient and medical records. No language interpreter was used.  Shortness of Breath Severity:  Moderate Onset quality:  Gradual Duration:  1 week Timing:  Constant Progression:  Worsening Chronicity:  New Relieved by:  Nothing Worsened by:  Exertion Ineffective treatments:  None tried Associated symptoms: no abdominal pain, no chest pain, no cough, no diaphoresis, no fever, no headaches, no neck pain, no sputum production, no vomiting and no wheezing   Risk factors: obesity "    Physical Exam  BP 101/81 (BP Location: Left Arm)   Pulse 81   Temp 97.9 F (36.6 C) (Oral)   Resp 16   SpO2 99%   Physical Exam Vitals signs and nursing note reviewed.  Constitutional:      Appearance: He is well-developed. He is obese.     Comments: Nasal cannula in place on 2L.   HENT:     Head: Normocephalic and atraumatic.  Neck:     Musculoskeletal: Neck supple.  Pulmonary:     Effort: Pulmonary effort is normal.     Breath sounds: Rales present.     Comments: Crackles in bilateral bases Musculoskeletal:     Right lower leg: Edema present.     Left lower leg: Edema present.  Neurological:     Mental Status: He is alert and oriented to person, place, and time.     Cranial Nerves: No cranial nerve deficit.  Psychiatric:        Behavior: Behavior normal.    ED Course/Procedures     Procedures  MDM   73 year old male with a history of diabetes mellitus type 2, CKD stage III, stage III squamous cell cancer, thoracic aortic aneurysm, COPD, HLD with a Pleurx drain in place received at signout from Dr. [name] pending labs.  In brief, patient with 3 days of worsening shortness of breath, upper and lower peripheral edema, and a 12 pound weight gain over the last 2 weeks despite taking home Lasix.  He also notes Pleurx has had decreased output.  BNP elevated at 501.   Crackles auscultated on pulmonary exam.  He has had intermittent sats in the 80s since arrival to the ER and was placed on supplemental oxygen with improvement into the 90s.  Chest x-ray with stable volume loss in the right hemithorax with loculated pleural effusion and soft tissue thickening in the right perihilar region.  Suspect new onset, acute heart failure exacerbation.  Low suspicion for ACS as delta troponin is unremarkable and EKG does not appear ischemic.   Creatinine is 1.94, up from 1.54 three weeks ago.  20 of IV Lasix given in the ER.  Consult to the hospitalist team and spoke with Dr. Hal Hope will accept the patient for admission. The patient appears reasonably stabilized for admission considering the current resources, flow, and capabilities available in the ED at this time, and I doubt any other [facility] requiring further screening and/or treatment in the ED prior to admission.    Joanne Gavel, PA-C 02/23/19 0006    Tegeler, Gwenyth Allegra, MD 02/24/19 [phone]

## 2019-02-22 NOTE — ED Triage Notes (Signed)
Patient c/o worsening SOB x3 days. Reports seven pound weight gain. States retaining fluid. States drain in pleural cavity has had decreased output. C/o swelling to lower extremities.

## 2019-02-22 NOTE — ED Notes (Signed)
Pt sitting in chair beside bed. Pt stated bed was hurting his back.

## 2019-02-22 NOTE — ED Provider Notes (Signed)
[facility] DEPT Provider Note   CSN: [id number] Arrival date & time: 02/22/19  1440     History   Chief Complaint Chief Complaint  Patient presents with  . Shortness of Breath    HPI Darrell [name] is a 73 y.o. male.     The history is provided by the patient and medical records. No language interpreter was used.  Shortness of Breath Severity:  Moderate Onset quality:  Gradual Duration:  1 week Timing:  Constant Progression:  Worsening Chronicity:  New Relieved by:  Nothing Worsened by:  Exertion Ineffective treatments:  None tried Associated symptoms: no abdominal pain, no chest pain, no cough, no diaphoresis, no fever, no headaches, no neck pain, no sputum production, no vomiting and no wheezing   Risk factors: obesity     Past Medical History:  Diagnosis Date  . Aortic atherosclerosis ([place]) 07/18/2018  . Cancer ([place])   . Cellulitis of right lower extremity   . Chronic kidney disease   . Diabetes mellitus without complication ([place])   . Dyslipidemia   . Hypertension   . Thoracic aortic aneurysm without rupture ([place]) 07/18/2018    Patient Active Problem List   Diagnosis Date Noted  . Closed fracture of left proximal humerus 02/02/2019  . Hypokalemia 09/18/2018  . Dyspnea 08/29/2018  . COPD (chronic obstructive pulmonary disease) ([place]) 08/03/2018  . Hemoptysis 07/18/2018  . CKD (chronic kidney disease) stage 3, GFR 30-59 ml/min ([facility]) 07/18/2018  . Lobar pneumonia ([place]) 07/18/2018  . Thoracic aortic aneurysm without rupture ([place]) 07/18/2018  . Aortic atherosclerosis ([place]) 07/18/2018  . Encounter for antineoplastic immunotherapy 05/17/2018  . Port-A-Cath in place 03/27/2018  . Encounter for antineoplastic chemotherapy 02/16/2018  . 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 Goals of care, counseling/discussion 02/07/2018  . ANGIOKERATOMA, BLEEDING 10/06/2006  . DM2 (diabetes mellitus, type 2) ([place]) 10/06/2006  . HYPERLIPIDEMIA  10/06/2006  . HYPERTENSION, BENIGN 10/06/2006  . LATERAL MENISCUS TEAR, RIGHT 10/06/2006  . CHOLECYSTECTOMY, HX OF 10/06/2006    Past Surgical History:  Procedure Laterality Date  . IR GUIDED DRAIN W CATHETER PLACEMENT  11/15/2018  . IR GUIDED DRAIN W CATHETER PLACEMENT  01/31/2019  . IR INSTILL VIA CHEST TUBE AGENT FOR FIBRINOLYSIS INI DAY  01/23/2019  . IR REMOVAL OF PLURAL CATH W/CUFF  01/31/2019  . IR THORACENTESIS ASP PLEURAL SPACE W/IMG GUIDE  09/05/2018  . IR THORACENTESIS ASP PLEURAL SPACE W/IMG GUIDE  10/11/2018  . IR THORACENTESIS ASP PLEURAL SPACE W/IMG GUIDE  10/30/2018  . PORTACATH PLACEMENT Left 02/21/2018   Procedure: INSERTION PORT-A-CATH;  Surgeon: Grace Isaac, MD;  Location: [facility] OR;  Service: Thoracic;  Laterality: Left;        Home Medications    Prior to Admission medications   Medication Sig Start Date End Date Taking? Authorizing Provider  albuterol (PROVENTIL HFA;VENTOLIN HFA) 108 (90 Base) MCG/ACT inhaler Inhale 2 puffs into the lungs every 4 (four) hours as needed for wheezing or shortness of breath. 08/03/18   Byrum, Rose Fillers, MD  carvedilol (COREG) 6.25 MG tablet Take 6.25 mg by mouth 2 (two) times daily with a meal.  07/19/16   [provider]  cloNIDine (CATAPRES) 0.1 MG tablet Take 0.1 mg by mouth 2 (two) times daily.  08/31/16   [provider]  fenofibrate 160 MG tablet Take 160 mg by mouth daily.  11/13/17   [provider]  furosemide (LASIX) 20 MG tablet 1 tablet p.o. daily as needed for  swelling. 02/19/19   Curt Bears, MD  hydrochlorothiazide (HYDRODIURIL) 25 MG tablet Take 25 mg by mouth daily.  02/06/18   [provider]  Insulin Detemir (LEVEMIR FLEXTOUCH) 100 UNIT/ML Pen Inject 10 Units into the skin daily.  07/18/15   [provider]  Insulin Pen Needle (FIFTY50 PEN NEEDLES) 31G X 8 MM MISC USE AS DIRECTED 06/30/15   [provider]  lidocaine-prilocaine (EMLA) cream Apply 1 application topically  as needed. Patient taking differently: Apply 1 application topically as needed (port).  02/16/18   Curt Bears, MD  losartan (COZAAR) 50 MG tablet Take 50 mg by mouth daily.  01/08/18   [provider]  magnesium oxide (MAG-OX) 400 MG tablet Take 400 mg by mouth daily.    [provider]  metFORMIN (GLUCOPHAGE) 1000 MG tablet Take 1,000 mg by mouth 2 (two) times daily with a meal.  11/13/17   [provider]  Multiple Vitamin (MULTI-VITAMINS) TABS Take 1 tablet by mouth daily.     [provider]  Omega-3 1000 MG CAPS Take 1,200 mg by mouth 2 (two) times daily.     [provider]  oxyCODONE-acetaminophen (PERCOCET/ROXICET) 5-325 MG tablet Take 1 tablet by mouth every 6 (six) hours as needed for severe pain. Patient not taking: Reported on 02/02/2019 01/30/19   Caccavale, Sophia, PA-C  oxyCODONE-acetaminophen (PERCOCET/ROXICET) 5-325 MG tablet Take 1 tablet by mouth every 6 (six) hours as needed for severe pain. 02/02/19   Marybelle Killings, MD  oxyCODONE-acetaminophen (PERCOCET/ROXICET) 5-325 MG tablet Take 1 tablet by mouth every 4 (four) hours as needed for severe pain. 02/14/19   Marybelle Killings, MD  pioglitazone (ACTOS) 45 MG tablet Take 45 mg by mouth daily.  02/06/18   [provider]  pravastatin (PRAVACHOL) 40 MG tablet Take 40 mg by mouth daily.  01/08/18   [provider]  prochlorperazine (COMPAZINE) 10 MG tablet TAKE 1 TABLET BY MOUTH EVERY 6 HOURS AS NEEDED FOR NAUSEA OR VOMITING 02/21/18   Curt Bears, MD  traMADol (ULTRAM) 50 MG tablet Take 1 tablet (50 mg total) by mouth every 6 (six) hours as needed for severe pain. 06/23/18   Curt Bears, MD    Family History Family History  Problem Relation Age of Onset  . Breast cancer Mother   . CAD Father     Social History Social History   Tobacco Use  . Smoking status: Former Smoker    Packs/day: 2.00    Years: 35.00    Pack years: 70.00    Types: Cigarettes    Quit  date: 07/12/1996    Years since quitting: 22.6  . Smokeless tobacco: Never Used  Substance Use Topics  . Alcohol use: Yes    Comment: occasionally  . Drug use: Never     Allergies   Lisinopril   Review of Systems Review of Systems  Constitutional: Positive for fatigue. Negative for chills, diaphoresis and fever.  HENT: Negative for congestion.   Eyes: Negative for visual disturbance.  Respiratory: Positive for shortness of breath. Negative for cough, sputum production and wheezing.   Cardiovascular: Positive for leg swelling. Negative for chest pain and palpitations.  Gastrointestinal: Negative for abdominal pain, constipation, diarrhea, nausea and vomiting.  Genitourinary: Negative for flank pain.  Musculoskeletal: Negative for back pain, neck pain and neck stiffness.  Neurological: Negative for light-headedness and headaches.  Psychiatric/Behavioral: Negative for agitation.  All other systems reviewed and are negative.    Physical Exam Updated Vital  Signs BP (!) 107/57 (BP Location: Right Arm)   Pulse (!) 55   Temp 97.9 F (36.6 C) (Oral)   Resp 16   SpO2 99%   Physical Exam Vitals signs and nursing note reviewed.  Constitutional:      General: He is not in acute distress.    Appearance: He is well-developed. He is not ill-appearing, toxic-appearing or diaphoretic.  HENT:     Head: Normocephalic and atraumatic.  Eyes:     Conjunctiva/sclera: Conjunctivae normal.  Neck:     Musculoskeletal: Neck supple.  Cardiovascular:     Rate and Rhythm: Normal rate and regular rhythm.     Heart sounds: No murmur.  Pulmonary:     Effort: Pulmonary effort is normal. No tachypnea or respiratory distress.     Breath sounds: Rales present. No wheezing or rhonchi.  Chest:     Chest wall: No mass or tenderness.  Abdominal:     Palpations: Abdomen is soft.     Tenderness: There is no abdominal tenderness.  Musculoskeletal: Normal range of motion.     Right lower leg: He  exhibits no tenderness. Edema present.     Left lower leg: He exhibits no tenderness. Edema present.  Skin:    General: Skin is warm and dry.     Capillary Refill: Capillary refill takes less than 2 seconds.  Neurological:     General: No focal deficit present.     Mental Status: He is alert.  Psychiatric:        Mood and Affect: Mood normal.      ED Treatments / Results  Labs (all labs ordered are listed, but only abnormal results are displayed) Labs Reviewed  CBC WITH DIFFERENTIAL/PLATELET - Abnormal; Notable for the following components:      Result Value   WBC 2.9 (*)    RBC 2.95 (*)    Hemoglobin 8.6 (*)    HCT 27.3 (*)    RDW 18.1 (*)    Lymphs Abs 0.3 (*)    All other components within normal limits  COMPREHENSIVE METABOLIC PANEL - Abnormal; Notable for the following components:   Sodium 134 (*)    Glucose, Bld 102 (*)    BUN 50 (*)    Creatinine, Ser 1.94 (*)    Albumin 3.3 (*)    GFR calc non Af Amer 34 (*)    GFR calc Af Amer 39 (*)    All other components within normal limits  BRAIN NATRIURETIC PEPTIDE - Abnormal; Notable for the following components:   B Natriuretic Peptide 501.4 (*)    All other components within normal limits  LIPASE, BLOOD  TROPONIN I (HIGH SENSITIVITY)  TROPONIN I (HIGH SENSITIVITY)    EKG EKG Interpretation  Date/Time:  Thursday February 22 2019 16:08:29 EDT Ventricular Rate:  65 PR Interval:    QRS Duration: 102 QT Interval:  376 QTC Calculation: 391 R Axis:     Text Interpretation:  Atrial fibrillation Borderline T abnormalities, diffuse leads No STEMI  Confirmed by Nanda Quinton [phone]) on 02/22/2019 5:01:03 PM   Radiology Dg Chest 2 View  Result Date: 02/22/2019 CLINICAL DATA:  Shortness of breath. EXAM: CHEST - 2 VIEW COMPARISON:  January 31, 2019 FINDINGS: Injectable port in stable position. Normal cardiac silhouette. Stable volume loss in the right hemithorax with loculated pleural effusion. Stable soft tissue thickening in  the right perihilar region. The left lung is clear. Osseous structures are without acute abnormality. Soft tissues are grossly normal.  IMPRESSION: 1. Stable volume loss in the right hemithorax with loculated pleural effusion. 2. Stable soft tissue thickening in the right perihilar region. Electronically Signed   By: Fidela Salisbury M.D.   On: 02/22/2019 15:58    Procedures Procedures (including critical care time)  Medications Ordered in ED Medications  oxyCODONE-acetaminophen (PERCOCET/ROXICET) 5-325 MG per tablet 1 tablet (1 tablet Oral Given 02/22/19 2108)     Initial Impression / Assessment and Plan / ED Course  I have reviewed the triage vital signs and the nursing notes.  Pertinent labs & imaging results that were available during my care of the patient were reviewed by me and considered in my medical decision making (see chart for details).        Darrell [name] is a 73 y.o. male with a past medical history significant for hypertension, diabetes, non-small cell lung cancer with known pleural effusion and a Pleurx catheter, known thoracic aortic aneurysm, COPD not on oxygen at home, and CKD who presents with worsened peripheral edema and shortness of breath.  Patient reports that over the last 3 weeks he has had worsening edema in his legs and arms.  He feels his abdomen is slightly swollen.  He was started on Lasix for several days by PCP and he reports it did not help.  He reports over the last several days the swelling is continued to worsen and now he is being very short of breath when he tries to ambulate whatsoever.  He also has some shortness of breath at rest.  He denies any chest pain or palpitations.  No fevers, chills, congestion, or cough.  He reports his Pleurx catheter has continued to drain but a small amount than before.  He denies any recent injuries.  He denies any urinary symptoms or GI symptoms.  He denies any history of DVT or PE.  He is primarily concerned about  fluid overload.  On exam, bilateral lower extremities are edematous.  Arms are also edematous.  Patient had recent left shoulder fracture from a fall.  Patient is in a sling.  His lungs were very crackly on exam with rales in all lung fields.  No murmur appreciated.  Patient will had oxygen saturations intermittently in the 80s on my examination on room air, oxygen was started.  Clinically concerned patient is having fluid overload contributing to his hypoxia.  He will get diagnostic work-up including chest x-ray and labs.  Based on his lack of any chest pain or unilateral leg swelling, I have lower suspicion for a DVT or PE and rather suspect is generalized fluid.  He does report he is gained about 12 pounds in the last 2 weeks.  Anticipate admission for hypoxia after work-up.     11:17 PM BNP elevated over 500. With no hx of CHF, new peripheral edema, and new hypoxia requoiring oxygen, do not feel pt is safe dor discharge. PT will be admitted for echo and further workup.      Final Clinical Impressions(s) / ED Diagnoses   Final diagnoses:  SOB (shortness of breath)  Hypoxia  Elevated brain natriuretic peptide (BNP) level    ED Discharge Orders    None      Clinical Impression: 1. SOB (shortness of breath)   2. Hypoxia   3. Elevated brain natriuretic peptide (BNP) level     Disposition: Admit  This note was prepared with assistance of Dragon voice recognition software. Occasional wrong-word or sound-a-like substitutions may have occurred due to the  inherent limitations of voice recognition software.     Carter Kaman, Gwenyth Allegra, MD 02/22/19 2322

## 2019-02-23 ENCOUNTER — Ambulatory Visit: Payer: Medicare Other | Admitting: Orthopaedic Surgery

## 2019-02-23 ENCOUNTER — Inpatient Hospital Stay (HOSPITAL_COMMUNITY): Payer: Medicare Other

## 2019-02-23 ENCOUNTER — Encounter (HOSPITAL_COMMUNITY): Payer: Self-pay | Admitting: Internal Medicine

## 2019-02-23 DIAGNOSIS — N183 Chronic kidney disease, stage 3 (moderate): Secondary | ICD-10-CM

## 2019-02-23 DIAGNOSIS — C3411 Malignant neoplasm of upper lobe, right bronchus or lung: Secondary | ICD-10-CM

## 2019-02-23 DIAGNOSIS — Z794 Long term (current) use of insulin: Secondary | ICD-10-CM

## 2019-02-23 DIAGNOSIS — R0602 Shortness of breath: Secondary | ICD-10-CM

## 2019-02-23 DIAGNOSIS — E1122 Type 2 diabetes mellitus with diabetic chronic kidney disease: Secondary | ICD-10-CM

## 2019-02-23 DIAGNOSIS — I509 Heart failure, unspecified: Secondary | ICD-10-CM

## 2019-02-23 DIAGNOSIS — I10 Essential (primary) hypertension: Secondary | ICD-10-CM

## 2019-02-23 DIAGNOSIS — I712 Thoracic aortic aneurysm, without rupture: Secondary | ICD-10-CM

## 2019-02-23 LAB — TSH: TSH: 5.561 u[IU]/mL — ABNORMAL HIGH (ref 0.350–4.500)

## 2019-02-23 LAB — BASIC METABOLIC PANEL
Anion gap: 13 (ref 5–15)
BUN: 54 mg/dL — ABNORMAL HIGH (ref 8–23)
CO2: 23 mmol/L (ref 22–32)
Calcium: 9.3 mg/dL (ref 8.9–10.3)
Chloride: 99 mmol/L (ref 98–111)
Creatinine, Ser: 1.94 mg/dL — ABNORMAL HIGH (ref 0.61–1.24)
GFR calc Af Amer: 39 mL/min — ABNORMAL LOW (ref >60)
GFR calc non Af Amer: 34 mL/min — ABNORMAL LOW (ref >60)
Glucose, Bld: 107 mg/dL — ABNORMAL HIGH (ref 70–99)
Potassium: 3.3 mmol/L — ABNORMAL LOW (ref 3.5–5.1)
Sodium: 135 mmol/L (ref 135–145)

## 2019-02-23 LAB — T4, FREE: Free T4: 1.39 ng/dL — ABNORMAL HIGH (ref 0.61–1.12)

## 2019-02-23 LAB — GLUCOSE, CAPILLARY
Glucose-Capillary: 92 mg/dL (ref 70–99)
Glucose-Capillary: 96 mg/dL (ref 70–99)
Glucose-Capillary: 122 mg/dL — ABNORMAL HIGH (ref 70–99)
Glucose-Capillary: 108 mg/dL — ABNORMAL HIGH (ref 70–99)

## 2019-02-23 LAB — HEMOGLOBIN A1C
Hgb A1c MFr Bld: 5.8 % — ABNORMAL HIGH (ref 4.8–5.6)
Mean Plasma Glucose: 119.76 mg/dL

## 2019-02-23 LAB — ECHOCARDIOGRAM COMPLETE
Height: 69 in
Weight: 4444.47 oz

## 2019-02-23 LAB — MAGNESIUM: Magnesium: 2.1 mg/dL (ref 1.7–2.4)

## 2019-02-23 LAB — SARS CORONAVIRUS 2 BY RT PCR (HOSPITAL ORDER, PERFORMED IN ~~LOC~~ HOSPITAL LAB): SARS Coronavirus 2: NEGATIVE

## 2019-02-23 MED ORDER — PRAVASTATIN SODIUM 40 MG PO TABS
40.0000 mg | ORAL_TABLET | Freq: Every day | ORAL | Status: DC
  Administered 2019-02-23 – 2019-02-28 (×6): 40 mg via ORAL
  Filled 2019-02-23: qty 1
  Filled 2019-02-23: qty 2
  Filled 2019-02-23 (×4): qty 1

## 2019-02-23 MED ORDER — INSULIN DETEMIR 100 UNIT/ML ~~LOC~~ SOLN
10.0000 [IU] | Freq: Every day | SUBCUTANEOUS | Status: DC
  Administered 2019-02-23 – 2019-02-28 (×7): 10 [IU] via SUBCUTANEOUS
  Filled 2019-02-23 (×6): qty 0.1

## 2019-02-23 MED ORDER — ACETAMINOPHEN 650 MG RE SUPP: 650.0000 mg | Freq: Four times a day (QID) | RECTAL | Status: DC | PRN

## 2019-02-23 MED ORDER — ALBUTEROL SULFATE HFA 108 (90 BASE) MCG/ACT IN AERS
2.0000 | INHALATION_SPRAY | RESPIRATORY_TRACT | Status: DC | PRN
  Filled 2019-02-23: qty 6.7

## 2019-02-23 MED ORDER — OMEGA-3 1000 MG PO CAPS: 1200.0000 mg | ORAL_CAPSULE | Freq: Two times a day (BID) | ORAL | Status: DC

## 2019-02-23 MED ORDER — CLONIDINE HCL 0.1 MG PO TABS
0.1000 mg | ORAL_TABLET | Freq: Two times a day (BID) | ORAL | Status: DC
  Administered 2019-02-23 – 2019-02-27 (×9): 0.1 mg via ORAL
  Filled 2019-02-23 (×9): qty 1

## 2019-02-23 MED ORDER — NYSTATIN 100000 UNIT/GM EX POWD
Freq: Three times a day (TID) | CUTANEOUS | Status: DC
  Administered 2019-02-23 – 2019-02-28 (×13): via TOPICAL
  Filled 2019-02-23: qty 15

## 2019-02-23 MED ORDER — LOSARTAN POTASSIUM 50 MG PO TABS
50.0000 mg | ORAL_TABLET | Freq: Every day | ORAL | Status: DC
  Administered 2019-02-23: 50 mg via ORAL
  Filled 2019-02-23: qty 1

## 2019-02-23 MED ORDER — HEPARIN BOLUS VIA INFUSION
4000.0000 [IU] | Freq: Once | INTRAVENOUS | Status: AC
  Administered 2019-02-23: 4000 [IU] via INTRAVENOUS
  Filled 2019-02-23: qty 4000

## 2019-02-23 MED ORDER — INSULIN ASPART 100 UNIT/ML ~~LOC~~ SOLN
0.0000 [IU] | Freq: Three times a day (TID) | SUBCUTANEOUS | Status: DC
  Administered 2019-02-23 – 2019-02-27 (×6): 1 [IU] via SUBCUTANEOUS
  Filled 2019-02-23: qty 0.09

## 2019-02-23 MED ORDER — HYDROCORTISONE 0.5 % EX CREA
TOPICAL_CREAM | Freq: Three times a day (TID) | CUTANEOUS | Status: DC
  Administered 2019-02-24 – 2019-02-28 (×11): via TOPICAL
  Filled 2019-02-23: qty 28.35

## 2019-02-23 MED ORDER — ENOXAPARIN SODIUM 40 MG/0.4ML ~~LOC~~ SOLN: 40.0000 mg | SUBCUTANEOUS | Status: DC

## 2019-02-23 MED ORDER — OXYCODONE-ACETAMINOPHEN 5-325 MG PO TABS
1.0000 | ORAL_TABLET | ORAL | Status: DC | PRN
  Administered 2019-02-23 – 2019-02-28 (×27): 1 via ORAL
  Filled 2019-02-23 (×27): qty 1

## 2019-02-23 MED ORDER — TRAMADOL HCL 50 MG PO TABS
50.0000 mg | ORAL_TABLET | Freq: Four times a day (QID) | ORAL | Status: DC | PRN
  Administered 2019-02-23: 50 mg via ORAL
  Filled 2019-02-23: qty 1

## 2019-02-23 MED ORDER — HEPARIN (PORCINE) 25000 UT/250ML-% IV SOLN
1400.0000 [IU]/h | INTRAVENOUS | Status: DC
  Administered 2019-02-23: 1400 [IU]/h via INTRAVENOUS
  Filled 2019-02-23: qty 250

## 2019-02-23 MED ORDER — CARVEDILOL 6.25 MG PO TABS
6.2500 mg | ORAL_TABLET | Freq: Two times a day (BID) | ORAL | Status: DC
  Administered 2019-02-23 – 2019-02-28 (×11): 6.25 mg via ORAL
  Filled 2019-02-23 (×13): qty 1

## 2019-02-23 MED ORDER — ONDANSETRON HCL 4 MG/2ML IJ SOLN: 4.0000 mg | Freq: Four times a day (QID) | INTRAMUSCULAR | Status: DC | PRN

## 2019-02-23 MED ORDER — ACETAMINOPHEN 325 MG PO TABS: 650.0000 mg | ORAL_TABLET | Freq: Four times a day (QID) | ORAL | Status: DC | PRN

## 2019-02-23 MED ORDER — OMEGA-3-ACID ETHYL ESTERS 1 G PO CAPS
1.0000 g | ORAL_CAPSULE | Freq: Two times a day (BID) | ORAL | Status: DC
  Administered 2019-02-23 – 2019-02-28 (×11): 1 g via ORAL
  Filled 2019-02-23 (×11): qty 1

## 2019-02-23 MED ORDER — HYDROCHLOROTHIAZIDE 25 MG PO TABS
25.0000 mg | ORAL_TABLET | Freq: Every day | ORAL | Status: DC
  Administered 2019-02-23: 25 mg via ORAL
  Filled 2019-02-23: qty 1

## 2019-02-23 MED ORDER — POTASSIUM CHLORIDE CRYS ER 20 MEQ PO TBCR
20.0000 meq | EXTENDED_RELEASE_TABLET | Freq: Once | ORAL | Status: AC
  Administered 2019-02-23: 20 meq via ORAL
  Filled 2019-02-23: qty 1

## 2019-02-23 MED ORDER — ONDANSETRON HCL 4 MG PO TABS: 4.0000 mg | ORAL_TABLET | Freq: Four times a day (QID) | ORAL | Status: DC | PRN

## 2019-02-23 MED ORDER — FENOFIBRATE 160 MG PO TABS
160.0000 mg | ORAL_TABLET | Freq: Every day | ORAL | Status: DC
  Administered 2019-02-23 – 2019-02-28 (×6): 160 mg via ORAL
  Filled 2019-02-23 (×6): qty 1

## 2019-02-23 MED ORDER — PERFLUTREN LIPID MICROSPHERE
1.0000 mL | INTRAVENOUS | Status: AC | PRN
  Administered 2019-02-23: 2 mL via INTRAVENOUS
  Filled 2019-02-23: qty 10

## 2019-02-23 MED ORDER — ENOXAPARIN SODIUM 60 MG/0.6ML ~~LOC~~ SOLN
60.0000 mg | SUBCUTANEOUS | Status: DC
  Administered 2019-02-23: 60 mg via SUBCUTANEOUS
  Filled 2019-02-23 (×2): qty 0.6

## 2019-02-23 NOTE — H&P (Signed)
History and Physical    Darrell [name]:[id number] DOB: [birth year] DOA: 02/22/2019  PCP: [name], MD  Patient coming from: Home.  Chief Complaint: Shortness of breath increasing peripheral edema.  HPI: Darrell [name] is a 73 y.o. 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  Patient states he has been getting increasing weight over the last few weeks and may have gained over 10 pounds.  He is getting more short of breath on exertion denies any chest pain fever chills productive cough.  Patient's oncologist had placed patient on Lasix 20 mg daily for last 3 days despite which patient shortness of breath does not improve.  Patient had a recent left shoulder fracture.  ED Course: On exam in the ER patient has significant peripheral edema elevated JVD with chest x-ray does not show anything acute.  EKG shows A. fib rate controlled.  Afebrile COVID-19 test was negative.  Labs show creatinine 1.9 albumin 3.3 BNP of 501.4 high-sensitivity troponin VI hemoglobin 8.6 at baseline.  Patient was started on Lasix 40 mg IV by me and admitted for likely new onset CHF.  Review of Systems: As per HPI, rest all negative.   Past Medical History:  Diagnosis Date  . Aortic atherosclerosis ([place]) 07/18/2018  . Cancer ([place])   . Cellulitis of right lower extremity   . Chronic kidney disease   . Diabetes mellitus without complication ([place])   . Dyslipidemia   . Hypertension   . Thoracic aortic aneurysm without rupture ([place]) 07/18/2018    Past Surgical History:  Procedure Laterality Date  . IR GUIDED DRAIN W CATHETER PLACEMENT  11/15/2018  . IR GUIDED DRAIN W CATHETER PLACEMENT   01/31/2019  . IR INSTILL VIA CHEST TUBE AGENT FOR FIBRINOLYSIS INI DAY  01/23/2019  . IR REMOVAL OF PLURAL CATH W/CUFF  01/31/2019  . IR THORACENTESIS ASP PLEURAL SPACE W/IMG GUIDE  09/05/2018  . IR THORACENTESIS ASP PLEURAL SPACE W/IMG GUIDE  10/11/2018  . IR THORACENTESIS ASP PLEURAL SPACE W/IMG GUIDE  10/30/2018  . PORTACATH PLACEMENT Left 02/21/2018   Procedure: INSERTION PORT-A-CATH;  Surgeon: Grace Isaac, MD;  Location: [facility];  Service: Thoracic;  Laterality: Left;     reports that he quit smoking about 22 years ago. His smoking use included cigarettes. He has a 70.00 pack-year smoking history. He has never used smokeless tobacco. He reports current alcohol use. He reports that he does not use drugs.  Allergies  Allergen Reactions  . Lisinopril Cough         Family History  Problem Relation Age of Onset  . Breast cancer Mother   . CAD Father     Prior to Admission medications   Medication Sig Start Date End Date Taking? Authorizing Provider  albuterol (PROVENTIL HFA;VENTOLIN HFA) 108 (90 Base) MCG/ACT inhaler Inhale 2 puffs into the lungs every 4 (four) hours as needed for wheezing or shortness of breath. 08/03/18  Yes Byrum, Rose Fillers, MD  carvedilol (COREG) 6.25 MG tablet Take 6.25 mg by mouth 2 (two) times daily with a meal.  07/19/16  Yes [provider]  cloNIDine (CATAPRES) 0.1 MG tablet Take 0.1 mg by mouth 2 (two) times daily.  08/31/16  Yes [provider]  fenofibrate 160 MG tablet Take 160 mg by mouth daily.  11/13/17  Yes [provider]  furosemide (LASIX) 20 MG tablet 1 tablet p.o. daily as needed for swelling. 02/19/19  Yes Curt Bears, MD  hydrochlorothiazide (HYDRODIURIL) 25 MG tablet Take 25 mg by mouth daily.  02/06/18  Yes [provider]  Insulin Detemir (LEVEMIR FLEXTOUCH) 100 UNIT/ML Pen Inject 10 Units into the skin daily.  07/18/15  Yes [provider]  lidocaine-prilocaine (EMLA) cream Apply 1 application  topically as needed. Patient taking differently: Apply 1 application topically as needed (port).  02/16/18  Yes Curt Bears, MD  losartan (COZAAR) 50 MG tablet Take 50 mg by mouth daily.  01/08/18  Yes [provider]  metFORMIN (GLUCOPHAGE) 1000 MG tablet Take 1,000 mg by mouth 2 (two) times daily with a meal.  11/13/17  Yes [provider]  Multiple Vitamin (MULTI-VITAMINS) TABS Take 1 tablet by mouth daily.    Yes [provider]  Omega-3 1000 MG CAPS Take 1,200 mg by mouth 2 (two) times daily.    Yes [provider]  oxyCODONE-acetaminophen (PERCOCET/ROXICET) 5-325 MG tablet Take 1 tablet by mouth every 4 (four) hours as needed for severe pain. 02/14/19  Yes Marybelle Killings, MD  pioglitazone (ACTOS) 45 MG tablet Take 45 mg by mouth daily.  02/06/18  Yes [provider]  pravastatin (PRAVACHOL) 40 MG tablet Take 40 mg by mouth daily.  01/08/18  Yes [provider]  prochlorperazine (COMPAZINE) 10 MG tablet TAKE 1 TABLET BY MOUTH EVERY 6 HOURS AS NEEDED FOR NAUSEA OR VOMITING Patient taking differently: Take 10 mg by mouth every 6 (six) hours as needed for nausea or vomiting.  02/21/18  Yes Curt Bears, MD  traMADol (ULTRAM) 50 MG tablet Take 1 tablet (50 mg total) by mouth every 6 (six) hours as needed for severe pain. 06/23/18  Yes Curt Bears, MD  Insulin Pen Needle (FIFTY50 PEN NEEDLES) 31G X 8 MM MISC USE AS DIRECTED 06/30/15   [provider]  oxyCODONE-acetaminophen (PERCOCET/ROXICET) 5-325 MG tablet Take 1 tablet by mouth every 6 (six) hours as needed for severe pain. Patient not taking: Reported on 02/02/2019 01/30/19   Caccavale, Sophia, PA-C  oxyCODONE-acetaminophen (PERCOCET/ROXICET) 5-325 MG tablet Take 1 tablet by mouth every 6 (six) hours as needed for severe pain. Patient not taking: Reported on 02/22/2019 02/02/19   Marybelle Killings, MD    Physical Exam: Constitutional: Moderately built and nourished. Vitals:    02/22/19 1507 02/22/19 1510 02/22/19 2202 02/23/19 0033  BP: (!) 107/57  101/81 113/77  Pulse: (!) 55  81 78  Resp: 16  16 18   Temp:  97.9 F (36.6 C)    TempSrc: Oral Oral    SpO2: 99%  99% 100%   Eyes: Anicteric no pallor. ENMT: No discharge from the ears eyes nose and mouth. Neck: No mass felt.  No neck rigidity. Respiratory: No rhonchi or crepitations. Cardiovascular: S1-S2 heard. Abdomen: Soft nontender bowel sounds present. Musculoskeletal: Bilateral lower extremity edema extending up to the knee.  Left arm sling. Skin: No rash. Neurologic: Alert awake oriented to time place and person.  Moves all extremities. Psychiatric: Appears poor normal per normal affect.   Labs on Admission: I have personally reviewed following labs and imaging studies  CBC: Recent Labs  Lab 02/19/19 0845 02/22/19 2120  WBC 3.1* 2.9*  NEUTROABS 2.2 2.0  HGB 8.8* 8.6*  HCT 27.2* 27.3*  MCV 90.7 92.5  PLT 163 191   Basic Metabolic Panel: Recent Labs  Lab 02/19/19 0845 02/22/19 2120  NA 139 134*  K 3.8 3.5  CL 104 101  CO2 23 25  GLUCOSE 131* 102*  BUN 44* 50*  CREATININE 1.94* 1.94*  CALCIUM 9.6 9.2   GFR: Estimated Creatinine Clearance: 46.5 mL/min (A) (by C-G formula based on SCr of 1.94 mg/dL (H)). Liver Function Tests: Recent Labs  Lab 02/19/19 0845 02/22/19 2120  AST 23 26  ALT 12 17  ALKPHOS 48 47  BILITOT 0.7 0.9  PROT 6.9 6.8  ALBUMIN 3.3* 3.3*   Recent Labs  Lab 02/22/19 2120  LIPASE 18   No results for input(s): AMMONIA in the last 168 hours. Coagulation Profile: No results for input(s): INR, PROTIME in the last 168 hours. Cardiac Enzymes: No results for input(s): CKTOTAL, CKMB, CKMBINDEX, TROPONINI in the last 168 hours. BNP (last 3 results) No results for input(s): PROBNP in the last 8760 hours. HbA1C: No results for input(s): HGBA1C in the last 72 hours. CBG: No results for input(s): GLUCAP in the last 168 hours. Lipid Profile: No results for  input(s): CHOL, HDL, LDLCALC, TRIG, CHOLHDL, LDLDIRECT in the last 72 hours. Thyroid Function Tests: No results for input(s): TSH, T4TOTAL, FREET4, T3FREE, THYROIDAB in the last 72 hours. Anemia Panel: No results for input(s): VITAMINB12, FOLATE, FERRITIN, TIBC, IRON, RETICCTPCT in the last 72 hours. Urine analysis: No results found for: COLORURINE, APPEARANCEUR, LABSPEC, PHURINE, GLUCOSEU, HGBUR, BILIRUBINUR, KETONESUR, PROTEINUR, UROBILINOGEN, NITRITE, LEUKOCYTESUR Sepsis Labs: @LABRCNTIP (procalcitonin:4,lacticidven:4) ) Recent Results (from the past 240 hour(s))  SARS Coronavirus 2 [facility] order, Performed in [facility] hospital lab) Nasopharyngeal Nasopharyngeal Swab     Status: None   Collection Time: 02/22/19 11:33 PM   Specimen: Nasopharyngeal Swab  Result Value Ref Range Status   SARS Coronavirus 2 NEGATIVE NEGATIVE Final    Comment: (NOTE) If result is NEGATIVE SARS-CoV-2 target nucleic acids are NOT DETECTED. The SARS-CoV-2 RNA is generally detectable in upper and lower  respiratory specimens during the acute phase of infection. The lowest  concentration of SARS-CoV-2 viral copies this assay can detect is 250  copies / mL. A negative result does not preclude SARS-CoV-2 infection  and should not be used as the sole basis for treatment or other  patient management decisions.  A negative result may occur with  improper specimen collection / handling, submission of specimen other  than nasopharyngeal swab, presence of viral mutation(s) within the  areas targeted by this assay, and inadequate number of viral copies  (<250 copies / mL). A negative result must be combined with clinical  observations, patient history, and epidemiological information. If result is POSITIVE SARS-CoV-2 target nucleic acids are DETECTED. The SARS-CoV-2 RNA is generally detectable in upper and lower  respiratory specimens dur ing the acute phase of infection.  Positive  results are indicative of  active infection with SARS-CoV-2.  Clinical  correlation with patient history and other diagnostic information is  necessary to determine patient infection status.  Positive results do  not rule out bacterial infection or co-infection with other viruses. If result is PRESUMPTIVE POSTIVE SARS-CoV-2 nucleic acids MAY BE PRESENT.   A presumptive positive result was obtained on the submitted specimen  and confirmed on repeat testing.  While 2019 novel coronavirus  (SARS-CoV-2) nucleic acids may be present in the submitted sample  additional confirmatory testing may be necessary for epidemiological  and / or clinical management purposes  to differentiate between  SARS-CoV-2 and other  Sarbecovirus currently known to infect humans.  If clinically indicated additional testing with an alternate test  methodology [phone]) is advised. The SARS-CoV-2 RNA is generally  detectable in upper and lower respiratory sp ecimens during the acute  phase of infection. The expected result is Negative. Fact Sheet for Patients:  StrictlyIdeas.no Fact Sheet for Healthcare Providers: BankingDealers.co.za This test is not yet approved or cleared by the [place] FDA and has been authorized for detection and/or diagnosis of SARS-CoV-2 by FDA under an Emergency Use Authorization (EUA).  This EUA will remain in effect (meaning this test can be used) for the duration of the COVID-19 declaration under Section 564(b)(1) of the Act, 21 U.S.C. section 360bbb-3(b)(1), unless the authorization is terminated or revoked sooner. Performed at [facility], [place] [address], [place], [place] [address]      Radiological Exams on Admission: Dg Chest 2 View  Result Date: 02/22/2019 CLINICAL DATA:  Shortness of breath. EXAM: CHEST - 2 VIEW COMPARISON:  January 31, 2019 FINDINGS: Injectable port in stable position. Normal cardiac silhouette. Stable volume loss in  the right hemithorax with loculated pleural effusion. Stable soft tissue thickening in the right perihilar region. The left lung is clear. Osseous structures are without acute abnormality. Soft tissues are grossly normal. IMPRESSION: 1. Stable volume loss in the right hemithorax with loculated pleural effusion. 2. Stable soft tissue thickening in the right perihilar region. Electronically Signed   By: Fidela Salisbury M.D.   On: 02/22/2019 15:58    EKG: Independently reviewed.  A. fib rate controlled.  Assessment/Plan Principal Problem:   Acute CHF (congestive heart failure) (HCC) Active Problems:   DM2 (diabetes mellitus, type 2) (HCC)   HYPERTENSION, BENIGN   Stage III squamous cell cancer   CKD (chronic kidney disease) stage 3, GFR 30-59 ml/min (HCC)   Thoracic aortic aneurysm without rupture (HCC)   COPD (chronic obstructive pulmonary disease) ([place])    1. Acute CHF unknown EF -we will check 2D echo.  I have placed patient on Lasix 40 mg IV every 12.  Closely follow intake output metabolic panel daily weights.  Patient is on ARB.  Patient's Actos will need to be discontinued due to fluid overload. 2. Likely new onset A. Fib -patient takes Coreg presently rate controlled.  Will start patient on anticoagulation if there is no contraindication.  Chads 2 vascular is 3. 3. Hypertension on Coreg clonidine ARB and hydrochlorothiazide.  Note that patient has chronic kidney disease and is on IV Lasix closely follow metabolic panel and if there is worsening may have to hold ARB and hydrochlorothiazide. 4. Chronic and disease stage III creatinine appears to be at baseline when compared to right recent labs.  Note that patient is on ARB and IV Lasix at this time.  May have to hold ARB if there is further worsening of creatinine. 5. Recurrent pleural effusion on right-sided pleural catheter.  Chest x-ray does not show anything new. 6. Squamous cell lung cancer being followed by oncologist on  immunotherapy. 7. Anemia leukopenia likely related to patient's known history of cancer and chemotherapy. 8. Diabetes mellitus type 2 on Levemir.  Patient's Actos will need to be discontinued due to fluid overload. 9. History of thoracic aortic aneurysm per chart.  Denies any chest pain.   Patient has significant peripheral edema shortness of breath and CHF will need inpatient status for close monitoring.   DVT prophylaxis: We will start anticoagulation if there is no contraindication for A. fib. Code Status:  Full code. Family Communication: Discussed with patient. Disposition Plan: Home. Consults called: Cardiology. Admission status: Inpatient.   Rise Patience MD [facility] Pager [phone].  If 7PM-7AM, please contact night-coverage [email or web address] Password [email or web address]  02/23/2019, 2:12 AM

## 2019-02-23 NOTE — Progress Notes (Signed)
Echocardiogram 2D Echocardiogram with Definity has been performed.  02/23/2019 12:01 PM Maudry Mayhew, MHA, RVT, RDCS, RDMS

## 2019-02-23 NOTE — ED Notes (Signed)
Pt given recliner chair for comfort. Pt states he is uncomfortable in bed and wants to be in a recliner. Pt also sleeps in a recliner at home.

## 2019-02-23 NOTE — Progress Notes (Signed)
HEMATOLOGY-ONCOLOGY PROGRESS NOTE  SUBJECTIVE: The patient was admitted with shortness of breath and peripheral edema.  He is currently being followed at the cancer center for stage IIb/IIIa non-small cell lung cancer, squamous cell carcinoma.  He is on consolidation immunotherapy with Imfinzi 10 mg/kg every 2 weeks.  Last dose given on 02/19/2019.  On admission, the patient was found to have significant peripheral edema and JVD.  Chest x-ray did not show any acute findings.  EKG showed A. fib, rate controlled.  BNP was elevated at 501.4.  He was started on IV diuretics.  When seen today, the patient reports that his breathing is slightly improved.  He still has ongoing lower extremity edema which has not really changed.  He has a Pleurx catheter in place and states that the output from the Pleurx catheter has decreased overall.  He is removing 100 to 200 cc every other day.  He has ongoing left shoulder pain secondary to a fall.  No fevers or chills.  Denies chest discomfort.  No cough or hemoptysis.  Oncology History Overview Note  Patient presented with hemoptysis to ED while working in [facility].  Work up showed right lung mass.    Stage III squamous cell cancer  01/31/2018 Imaging   CXR/CT Chest/Bronchoscopy in [facility]   01/31/2018 Pathology Results   NSCLC Squamous cell    02/07/2018 Initial Diagnosis   Stage III squamous cell carcinoma of right lung ([place])   02/16/2018 Imaging   PET IMPRESSION: 1. There is intense FDG uptake associated with the right hilar lung mass which obstructs the right upper lobe bronchus. This results in mild postobstructive pneumonitis in the right upper lobe. 2. Small right paratracheal lymph node demonstrates mild FDG uptake with an SUV max of 3.48, equivocal for metastatic disease. No evidence for distant metastatic disease.   02/27/2018 - 04/16/2018 Chemotherapy   The patient had palonosetron (ALOXI) injection 0.25 mg, 0.25 mg, Intravenous,  Once, 6 of 6  cycles Administration: 0.25 mg (02/27/2018), 0.25 mg (03/27/2018), 0.25 mg (03/06/2018), 0.25 mg (03/14/2018), 0.25 mg (03/20/2018) CARBOplatin (PARAPLATIN) 180 mg in sodium chloride 0.9 % 250 mL chemo infusion, 180 mg (100 % of original dose 182 mg), Intravenous,  Once, 6 of 6 cycles Dose modification: 182 mg (original dose 182 mg, Cycle 1), 210 mg (original dose 182 mg, Cycle 6, Reason: Change in SCr/CrCl) Administration: 180 mg (02/27/2018), 180 mg (03/27/2018), 180 mg (03/06/2018), 180 mg (03/14/2018), 180 mg (03/20/2018) PACLitaxel (TAXOL) 108 mg in sodium chloride 0.9 % 250 mL chemo infusion (</= 80mg /m2), 45 mg/m2 = 108 mg, Intravenous,  Once, 6 of 6 cycles Administration: 108 mg (02/27/2018), 108 mg (03/27/2018), 108 mg (03/06/2018), 108 mg (03/14/2018), 108 mg (03/20/2018)  for chemotherapy treatment.    05/17/2018 -  Chemotherapy   The patient had durvalumab (IMFINZI) 1,120 mg in sodium chloride 0.9 % 100 mL chemo infusion, 10 mg/kg = 1,120 mg, Intravenous,  Once, 18 of 30 cycles Administration: 1,120 mg (05/17/2018), 1,120 mg (09/04/2018), 1,120 mg (10/02/2018), 1,120 mg (10/16/2018), 1,120 mg (10/31/2018), 1,120 mg (11/13/2018), 1,120 mg (11/27/2018), 1,120 mg (05/31/2018), 1,120 mg (12/12/2018), 1,120 mg (06/16/2018), 1,120 mg (07/10/2018), 1,120 mg (08/07/2018), 1,120 mg (08/21/2018), 1,120 mg (12/25/2018), 1,120 mg (01/08/2019), 1,120 mg (01/22/2019), 1,120 mg (02/05/2019), 1,120 mg (02/19/2019)  for chemotherapy treatment.       REVIEW OF SYSTEMS:   Constitutional: Denies fevers, chills.  Reports weight gain of approximately 10 pounds secondary to edema. Respiratory: Reports shortness of breath which is slightly improved this  morning.  Denies cough and hemoptysis. Cardiovascular: Denies palpitation, chest discomfort Gastrointestinal:  Denies nausea, heartburn or change in bowel habits Skin: Denies abnormal skin rashes Lymphatics: Denies new lymphadenopathy or easy bruising Neurological:Denies numbness, tingling or  new weaknesses Behavioral/Psych: Mood is stable, no new changes  Extremities: Reports lower extremity edema All other systems were reviewed with the patient and are negative.  I have reviewed the past medical history, past surgical history, social history and family history with the patient and they are unchanged from previous note.   PHYSICAL EXAMINATION: ECOG PERFORMANCE STATUS: 1 - Symptomatic but completely ambulatory  Vitals:   02/23/19 0800 02/23/19 0915  BP:  (!) 143/91  Pulse:    Resp:    Temp: 97.7 F (36.5 C)   SpO2:     Filed Weights   02/23/19 0630  Weight: 277 lb 12.5 oz (126 kg)    Intake/Output from previous day: No intake/output data recorded.  GENERAL:alert, no distress and comfortable NECK: Moderate JVD LYMPH:  no palpable lymphadenopathy in the cervical, axillary or inguinal LUNGS: Expiratory wheezes, normal breathing effort HEART: regular rate & rhythm and no murmurs.  3+ pitting edema to the bilateral lower extremities. ABDOMEN:abdomen soft, non-tender and normal bowel sounds Musculoskeletal:no cyanosis of digits and no clubbing  NEURO: alert & oriented x 3 with fluent speech, no focal motor/sensory deficits  LABORATORY DATA:  I have reviewed the data as listed CMP Latest Ref Rng & Units 02/22/2019 02/19/2019 02/05/2019  Glucose 70 - 99 mg/dL 102(H) 131(H) 143(H)  BUN 8 - 23 mg/dL 50(H) 44(H) 31(H)  Creatinine 0.61 - 1.24 mg/dL 1.94(H) 1.94(H) 1.54(H)  Sodium 135 - 145 mmol/L 134(L) 139 136  Potassium 3.5 - 5.1 mmol/L 3.5 3.8 3.8  Chloride 98 - 111 mmol/L 101 104 102  CO2 22 - 32 mmol/L 25 23 24   Calcium 8.9 - 10.3 mg/dL 9.2 9.6 9.7  Total Protein 6.5 - 8.1 g/dL 6.8 6.9 6.9  Total Bilirubin 0.3 - 1.2 mg/dL 0.9 0.7 0.7  Alkaline Phos 38 - 126 U/L 47 48 40  AST 15 - 41 U/L 26 23 18   ALT 0 - 44 U/L 17 12 10     Lab Results  Component Value Date   WBC 2.9 (L) 02/22/2019   HGB 8.6 (L) 02/22/2019   HCT 27.3 (L) 02/22/2019   MCV 92.5 02/22/2019    PLT 164 02/22/2019   NEUTROABS 2.0 02/22/2019    Dg Chest 2 View  Result Date: 02/22/2019 CLINICAL DATA:  Shortness of breath. EXAM: CHEST - 2 VIEW COMPARISON:  January 31, 2019 FINDINGS: Injectable port in stable position. Normal cardiac silhouette. Stable volume loss in the right hemithorax with loculated pleural effusion. Stable soft tissue thickening in the right perihilar region. The left lung is clear. Osseous structures are without acute abnormality. Soft tissues are grossly normal. IMPRESSION: 1. Stable volume loss in the right hemithorax with loculated pleural effusion. 2. Stable soft tissue thickening in the right perihilar region. Electronically Signed   By: Fidela Salisbury M.D.   On: 02/22/2019 15:58   Ir Guided Niel Hummer W Catheter Placement  Result Date: 01/31/2019 INDICATION: 73 year old male with a history of malignant right-sided pleural effusion with nonfunctioning existing PleurX catheter which was placed Nov 15, 2018 EXAM: IMAGE GUIDED PLACEMENT OF A NEW RIGHT-SIDED PLEURAL TUNNELED CATHETER AND REMOVAL OF THE PRIOR CATHETER MEDICATIONS: 2 G ANCEF the antibiotics were administered within an appropriate time frame prior to the initiation of the procedure. ANESTHESIA/SEDATION: Fentanyl 50 mcg  IV; Versed 1.0 mg IV Moderate Sedation Time:  20 MINUTES The patient was continuously monitored during the procedure by the interventional radiology nurse under my direct supervision. COMPLICATIONS: None PROCEDURE: The procedure, risks, benefits, and alternatives were explained to the patient and the patient's family. Specific risks that were addressed included bleeding, infection, pneumothorax, need for further procedure, chance of delayed pneumothorax or hemorrhage, hemoptysis, cardiopulmonary collapse, death. Questions regarding the procedure were encouraged and answered. The patient understands and consents to the procedure. The right chest wall and the indwelling catheter was prepped with  chlorhexidine in a sterile fashion, and a sterile drape was applied covering the operative field. A sterile gown and sterile gloves were used for the procedure. Local anesthesia was provided with 1% Lidocaine. Ultrasound image documentation was performed. After creating a small skin incision, a 19 gauge needle was advanced into the pleural cavity/pleural fluid under ultrasound guidance. A guide wire was then advanced under fluoroscopy into the pleural space. Pleural access was dilated serially and a 16-French peel-away sheath placed. The skin and subcutaneous tissues were generously infiltrated with 1% lidocaine from the puncture site over the pleura along the intercostal margin anteriorly. A small stab incision was made with 11 blade scalpel at the insertion site of the catheter, and the catheter was back tunneled to the site at the pleural puncture. A tunneled CareFusion Pleurex catheter was placed. This was tunneled from the incision 5 cm anterior to the pleural access to the access site. The catheter was advanced through the peel-away sheath. The sheath was then removed. At this point the prior tunneled catheter was removed from the pleural space. Final catheter positioning was confirmed with a fluoroscopic spot image. Both access incision sites were closed with Derma bond after chlorhexidine wash was again applied. Dermabond was applied to the catheterization incision. Approximately 400 cc of fluid performed via thoracentesis through the new catheter utilizing vacuum bottle The patient tolerated the procedure well and remained hemodynamically stable throughout. No complications were encountered and no significant blood loss was encountered. FINDINGS: Ultrasound demonstrates complex fluid within the dependent aspects of the right pleural space indicating proteinaceous/loculated fluid. Inspissated blood and debris found within the remote tunneled catheter occupying approximately 50% of the length of the lumen of  the catheter 400 cc of amber fluid removed through the current catheter with the final image demonstrating residual scarring/complex fluid/loculated fluid at the right lung base. The new tunneled pleural catheter appears subpleural. IMPRESSION: Status post placement of a new PleurX catheter/tunneled catheter into the right residual pleural fluid, with removal of the prior tunneled PleurX catheter. Signed, Dulcy Fanny. Dellia Nims, RPVI [facility] Radiology Electronically Signed   By: Corrie Mckusick D.O.   On: 01/31/2019 10:18   Dg Chest Port 1 View  Result Date: 01/31/2019 CLINICAL DATA:  Preop film for drainage of lung. EXAM: PORTABLE CHEST 1 VIEW COMPARISON:  January 06, 2019 FINDINGS: The heart size and mediastinal contours are stable. Moderate right pleural effusion with consolidation of the right mid and lower lung are unchanged. Consolidation of the right perihilar lung is identified unchanged. Right chest tube is unchanged. The visualized skeletal structures are stable. IMPRESSION: Moderate right pleural effusion with consolidation of the right mid and lower lung are unchanged. Consolidation of the right perihilar lung is unchanged. Electronically Signed   By: Abelardo Diesel M.D.   On: 01/31/2019 09:34   Dg Shoulder Left  Result Date: 01/30/2019 CLINICAL DATA:  73 year old who fell and  injured the LEFT shoulder earlier today. Patient currently unable to abduct the arm. Initial encounter. EXAM: LEFT SHOULDER - 2+ VIEW COMPARISON:  None. FINDINGS: Acute impacted fracture involving the humeral head and neck. An apparent free fragment is present which includes the greater tuberosity. Glenohumeral joint anatomically aligned with well-preserved joint space. Acromioclavicular joint anatomically aligned with only mild degenerative changes. Port-A-Cath port overlies the lower shoulder joint on the AP image. IMPRESSION: 1. Acute traumatic impacted fracture involving the  humeral head and neck. 2. Apparent free fragment which includes the greater tuberosity. Electronically Signed   By: Evangeline Dakin M.D.   On: 01/30/2019 14:00   Ir Removal Of Plural Cath W/cuff  Result Date: 01/31/2019 INDICATION: 73 year old male with a history of malignant right-sided pleural effusion with nonfunctioning existing PleurX catheter which was placed Nov 15, 2018 EXAM: IMAGE GUIDED PLACEMENT OF A NEW RIGHT-SIDED PLEURAL TUNNELED CATHETER AND REMOVAL OF THE PRIOR CATHETER MEDICATIONS: 2 G ANCEF the antibiotics were administered within an appropriate time frame prior to the initiation of the procedure. ANESTHESIA/SEDATION: Fentanyl 50 mcg IV; Versed 1.0 mg IV Moderate Sedation Time:  20 MINUTES The patient was continuously monitored during the procedure by the interventional radiology nurse under my direct supervision. COMPLICATIONS: None PROCEDURE: The procedure, risks, benefits, and alternatives were explained to the patient and the patient's family. Specific risks that were addressed included bleeding, infection, pneumothorax, need for further procedure, chance of delayed pneumothorax or hemorrhage, hemoptysis, cardiopulmonary collapse, death. Questions regarding the procedure were encouraged and answered. The patient understands and consents to the procedure. The right chest wall and the indwelling catheter was prepped with chlorhexidine in a sterile fashion, and a sterile drape was applied covering the operative field. A sterile gown and sterile gloves were used for the procedure. Local anesthesia was provided with 1% Lidocaine. Ultrasound image documentation was performed. After creating a small skin incision, a 19 gauge needle was advanced into the pleural cavity/pleural fluid under ultrasound guidance. A guide wire was then advanced under fluoroscopy into the pleural space. Pleural access was dilated serially and a 16-French peel-away sheath placed. The skin and subcutaneous tissues were  generously infiltrated with 1% lidocaine from the puncture site over the pleura along the intercostal margin anteriorly. A small stab incision was made with 11 blade scalpel at the insertion site of the catheter, and the catheter was back tunneled to the site at the pleural puncture. A tunneled CareFusion Pleurex catheter was placed. This was tunneled from the incision 5 cm anterior to the pleural access to the access site. The catheter was advanced through the peel-away sheath. The sheath was then removed. At this point the prior tunneled catheter was removed from the pleural space. Final catheter positioning was confirmed with a fluoroscopic spot image. Both access incision sites were closed with Derma bond after chlorhexidine wash was again applied. Dermabond was applied to the catheterization incision. Approximately 400 cc of fluid performed via thoracentesis through the new catheter utilizing vacuum bottle The patient tolerated the procedure well and remained hemodynamically stable throughout. No complications were encountered and no significant blood loss was encountered. FINDINGS: Ultrasound demonstrates complex fluid within the dependent aspects of the right pleural space indicating proteinaceous/loculated fluid. Inspissated blood and debris found within the remote tunneled catheter occupying approximately 50% of the length of the lumen of the catheter 400 cc of amber fluid removed through the current catheter with the final image demonstrating residual scarring/complex fluid/loculated fluid at the right lung base. The new tunneled  pleural catheter appears subpleural. IMPRESSION: Status post placement of a new PleurX catheter/tunneled catheter into the right residual pleural fluid, with removal of the prior tunneled PleurX catheter. Signed, Dulcy Fanny. Dellia Nims, RPVI [facility] Inter[facility] Radiology Special[facility] Radiology Electronically Signed   By: Corrie Mckusick D.O.   On: 01/31/2019 10:18     ASSESSMENT AND PLAN: 1.  Stage IIb/IIIa non-small cell lung cancer, squamous cell carcinoma 2.  New onset CHF 3.  New onset atrial fibrillation 4.  Stage III CKD 5.  Hypertension 6.  Normocytic anemia 7.  Mild leukopenia  -On immunotherapy with Imfinzi every 2 weeks.  Last dose given on 02/19/2019.  Will reevaluate the patient at his neck scheduled follow-up prior to [place].  -On IV diuretics.  Continue management per cardiology -On IV heparin for anticoagulation with plans to switch to DOAC -ARB and HCTZ discontinued secondary to renal insufficiency.  Avoid nephrotoxic agents.  Monitor renal function closely. -Anemia is likely multifactorial due to renal insufficiency as well as underlying malignancy.  Monitor and consider transfusion for hemoglobin less than 7.0.  -Leukopenia is overall stable.  Will monitor closely.  Please contact medical oncology over the weekend if there are questions.  Patient has outpatient follow-up already scheduled on 03/05/2019.   LOS: 1 day   Mikey Bussing, DNP, AGPCNP-BC, AOCNP 02/23/19

## 2019-02-23 NOTE — ED Notes (Signed)
Pt walked to restroom with no assistance. Gait steady

## 2019-02-23 NOTE — TOC Initial Note (Signed)
Transition of Care [facility]) - Initial/Assessment Note    Patient Details  Name: Darrell [name] MRN: [id number] Date of Birth: 1946-01-02  Transition of Care [facility]) CM/SW Contact:    Lynnell Catalan, RN Phone Number: 02/23/2019, 12:11 PM  Clinical Narrative:                 From home with spouse.   Expected Discharge Plan: [place] Barriers to Discharge: Continued Medical Work up   Expected Discharge Plan and Services Expected Discharge Plan: [place]             Admission diagnosis:  SOB (shortness of breath) [R06.02] Hypoxia [R09.02] Elevated brain natriuretic peptide (BNP) level [R79.89] Acute CHF (congestive heart failure) ([place]) [I50.9] Patient Active Problem List   Diagnosis Date Noted  . Acute CHF (congestive heart failure) ([place]) 02/22/2019  . Closed fracture of left proximal humerus 02/02/2019  . Hypokalemia 09/18/2018  . Dyspnea 08/29/2018  . COPD (chronic obstructive pulmonary disease) ([place]) 08/03/2018  . Hemoptysis 07/18/2018  . CKD (chronic kidney disease) stage 3, GFR 30-59 ml/min (HCC) 07/18/2018  . Lobar pneumonia ([place]) 07/18/2018  . Thoracic aortic aneurysm without rupture ([place]) 07/18/2018  . Aortic atherosclerosis ([place]) 07/18/2018  . Encounter for antineoplastic immunotherapy 05/17/2018  . Port-A-Cath in place 03/27/2018  . Encounter for antineoplastic chemotherapy 02/16/2018  . 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 Goals of care, counseling/discussion 02/07/2018  . ANGIOKERATOMA, BLEEDING 10/06/2006  . DM2 (diabetes mellitus, type 2) ([place]) 10/06/2006  . HYPERLIPIDEMIA 10/06/2006  . HYPERTENSION, BENIGN 10/06/2006  . LATERAL MENISCUS TEAR, RIGHT 10/06/2006  . CHOLECYSTECTOMY, HX OF 10/06/2006   PCP:  [name], MD Pharmacy:   [facility] [phone] [address], [place] AT [facility] [address] [place] [address] Phone: [phone] Fax:  [phone]     Social Determinants of Health ([place]) Interventions    Readmission Risk Interventions Readmission Risk Prevention Plan 02/23/2019  Transportation Screening Complete  PCP or Specialist Appt within 3-5 Days Not Complete  Not Complete comments Not ready for dc  HRI or [place] Not Complete  HRI or Home Care Consult comments NA  Social Work Consult for [place] Planning/Counseling Not Complete  SW consult not completed comments NA  Palliative Care Screening Not Applicable  Medication Review Press photographer) Complete  Some recent data might be hidden

## 2019-02-23 NOTE — Progress Notes (Signed)
ANTICOAGULATION CONSULT NOTE - Initial Consult  Pharmacy Consult for heparin Indication: atrial fibrillation  Allergies  Allergen Reactions  . Lisinopril Cough         Patient Measurements: Height: 5\' 9"  (175.3 cm) Weight: 277 lb 12.5 oz (126 kg) IBW/kg (Calculated) : 70.7 Heparin Dosing Weight: 99 kg  Vital Signs: Temp: 97.9 F (36.6 C) (08/14 0630) Temp Source: Oral (08/14 0630) BP: 134/72 (08/14 0630) Pulse Rate: 55 (08/14 0630)  Labs: Recent Labs    02/22/19 2120 02/22/19 2151  HGB 8.6*  --   HCT 27.3*  --   PLT 164  --   CREATININE 1.94*  --   TROPONINIHS 6 6    Estimated Creatinine Clearance: 46.5 mL/min (A) (by C-G formula based on SCr of 1.94 mg/dL (H)).   Medical History: Past Medical History:  Diagnosis Date  . Aortic atherosclerosis ([place]) 07/18/2018  . Cancer ([place])   . Cellulitis of right lower extremity   . Chronic kidney disease   . Diabetes mellitus without complication ([place])   . Dyslipidemia   . Hypertension   . Thoracic aortic aneurysm without rupture ([place]) 07/18/2018    Assessment: Pharmacy consulted to dose/monitor heparin for new onset atrial fibrillation. Pt was not on anticoagulation PTA. Baseline labs ordered STAT.  Today, 02/23/19  Hgb 8.6 is low - stable from yesterday  Plt 164 - WNL  APTT and protime/INR ordered STAT  Goal of Therapy:  Heparin level 0.3-0.7 units/ml Monitor platelets by anticoagulation protocol: Yes   Plan:  Give 4000 units bolus x 1 Start heparin infusion at 1400 units/hr Check anti-Xa level in 6 hours and daily while on heparin Continue to monitor H&H and platelets  Lenis Noon, PharmD 02/23/2019,6:55 AM

## 2019-02-23 NOTE — Consult Note (Addendum)
Cardiology Consultation:   Patient ID: Darrell [name] MRN: [id number]; DOB: [birth year]  Admit date: 02/22/2019 Date of Consult: 02/23/2019  Primary Care Provider: [name], MD Primary Cardiologist: Candee Furbish, MD  Primary Electrophysiologist:  None    Patient Profile:   Darrell [name] is a 73 y.o. male with a hx of stage III squamous cell carcinoma followed by Dr. Earlie Server on immunotherapy, recurrent pleural effusion with right sided Pleurx catheter placement, DM2, CKD stage III, HTN, thoracic aortic aneurysm who is being seen today for the evaluation of new onset CHFand new onset atrial fibrillation at the request of Dr. Karleen Hampshire.  History of Present Illness:   Patient has no significant cardiac history. He denies known history of a stroke, heart attack, CAD, Afib, or arterial disease. Patient says last year he had a stress test done in [place] and it was normal.   Patient was admitted in January of this year for PNA complicated by lung cancer and recurrent pleural effusions. The patient needed 3 thoracentesis performed for the recurrent fluid buildup. A Pleurx drain was placed 11/15/2018. Patient was draining it every other day. After a couple weeks the amount of fluid slowly diminished. Drain was replaced 01/31/19.   01/30/19 patient fell and landed on his left shoulder and went to the ER. Imaging confirmed a fracture of the left shoulder and patient was discharged in a sling.   Mr. Chestnut presented to the ER 8/13 for progressive peripheral edema and shortness of breath. Symptoms have been worsening over the last 2- 3 weeks. He noticed worsening leg edema and as well as swelling in his abdomen. He says he has gained over 10 lbs within the last 2 weeks. Patient became severly short of breath on exertion and was having trouble walking short distances. Patient also noticed increased output of the Pleurx drain. He denies chest pain, fever, chills, or cough. Patient's oncologist had  given him lasix 20mg  for the last 3 days without improvement.   In the ED patient was afebrile. BP 101/81, pulse 81, RR 16. Oxygen levels were noted to be in the 80s so patient was placed on 2L O2. On exam he was noted to have elevated JVD, peripheral edema, and crackles. Labs showed Potasium 3.5, creatinine 1.9, albumin 3.3, BNP 501, HS troponin 6 > 6. WBC 2.9, Hgb 8.6 (at baseline). CXR with stable volume loss in the right hemithorax with loculated pleural effusion and soft tissue thickening in the right perihilar region. COVD negative. He was given 20 IV lasix in the ER. EKG showed new onset afib, rate 65 bpm. Patient was started on IV Lasix 40 mg and admitted for new onset CHF.   Patient quit smoking 22 years ago. He denies drug use. He does drink alcohol, 5-6 beers per week. Patient has positive family history of heart disease in his father.   Heart Pathway Score:     Past Medical History:  Diagnosis Date  . Aortic atherosclerosis ([place]) 07/18/2018  . Cancer ([place])   . Cellulitis of right lower extremity   . Chronic kidney disease   . Diabetes mellitus without complication ([place])   . Dyslipidemia   . Hypertension   . Thoracic aortic aneurysm without rupture ([place]) 07/18/2018    Past Surgical History:  Procedure Laterality Date  . IR GUIDED DRAIN W CATHETER PLACEMENT  11/15/2018  . IR GUIDED DRAIN W CATHETER PLACEMENT  01/31/2019  . IR INSTILL VIA CHEST TUBE AGENT FOR FIBRINOLYSIS INI DAY  01/23/2019  .  IR REMOVAL OF PLURAL CATH W/CUFF  01/31/2019  . IR THORACENTESIS ASP PLEURAL SPACE W/IMG GUIDE  09/05/2018  . IR THORACENTESIS ASP PLEURAL SPACE W/IMG GUIDE  10/11/2018  . IR THORACENTESIS ASP PLEURAL SPACE W/IMG GUIDE  10/30/2018  . PORTACATH PLACEMENT Left 02/21/2018   Procedure: INSERTION PORT-A-CATH;  Surgeon: Grace Isaac, MD;  Location: [facility] OR;  Service: Thoracic;  Laterality: Left;     Home Medications:  Prior to Admission medications   Medication Sig Start Date End Date Taking?  Authorizing Provider  albuterol (PROVENTIL HFA;VENTOLIN HFA) 108 (90 Base) MCG/ACT inhaler Inhale 2 puffs into the lungs every 4 (four) hours as needed for wheezing or shortness of breath. 08/03/18  Yes Byrum, Rose Fillers, MD  carvedilol (COREG) 6.25 MG tablet Take 6.25 mg by mouth 2 (two) times daily with a meal.  07/19/16  Yes [provider]  cloNIDine (CATAPRES) 0.1 MG tablet Take 0.1 mg by mouth 2 (two) times daily.  08/31/16  Yes [provider]  fenofibrate 160 MG tablet Take 160 mg by mouth daily.  11/13/17  Yes [provider]  furosemide (LASIX) 20 MG tablet 1 tablet p.o. daily as needed for swelling. 02/19/19  Yes Curt Bears, MD  hydrochlorothiazide (HYDRODIURIL) 25 MG tablet Take 25 mg by mouth daily.  02/06/18  Yes [provider]  Insulin Detemir (LEVEMIR FLEXTOUCH) 100 UNIT/ML Pen Inject 10 Units into the skin daily.  07/18/15  Yes [provider]  lidocaine-prilocaine (EMLA) cream Apply 1 application topically as needed. Patient taking differently: Apply 1 application topically as needed (port).  02/16/18  Yes Curt Bears, MD  losartan (COZAAR) 50 MG tablet Take 50 mg by mouth daily.  01/08/18  Yes [provider]  metFORMIN (GLUCOPHAGE) 1000 MG tablet Take 1,000 mg by mouth 2 (two) times daily with a meal.  11/13/17  Yes [provider]  Multiple Vitamin (MULTI-VITAMINS) TABS Take 1 tablet by mouth daily.    Yes [provider]  Omega-3 1000 MG CAPS Take 1,200 mg by mouth 2 (two) times daily.    Yes [provider]  oxyCODONE-acetaminophen (PERCOCET/ROXICET) 5-325 MG tablet Take 1 tablet by mouth every 4 (four) hours as needed for severe pain. 02/14/19  Yes Marybelle Killings, MD  pioglitazone (ACTOS) 45 MG tablet Take 45 mg by mouth daily.  02/06/18  Yes [provider]  pravastatin (PRAVACHOL) 40 MG tablet Take 40 mg by mouth daily.  01/08/18  Yes [provider]  prochlorperazine (COMPAZINE) 10 MG  tablet TAKE 1 TABLET BY MOUTH EVERY 6 HOURS AS NEEDED FOR NAUSEA OR VOMITING Patient taking differently: Take 10 mg by mouth every 6 (six) hours as needed for nausea or vomiting.  02/21/18  Yes Curt Bears, MD  traMADol (ULTRAM) 50 MG tablet Take 1 tablet (50 mg total) by mouth every 6 (six) hours as needed for severe pain. 06/23/18  Yes Curt Bears, MD  Insulin Pen Needle (FIFTY50 PEN NEEDLES) 31G X 8 MM MISC USE AS DIRECTED 06/30/15   [provider]  oxyCODONE-acetaminophen (PERCOCET/ROXICET) 5-325 MG tablet Take 1 tablet by mouth every 6 (six) hours as needed for severe pain. Patient not taking: Reported on 02/02/2019 01/30/19   Caccavale, Sophia, PA-C  oxyCODONE-acetaminophen (PERCOCET/ROXICET) 5-325 MG tablet Take 1 tablet by mouth every 6 (six) hours as needed for severe pain. Patient not taking: Reported on 02/22/2019 02/02/19   Marybelle Killings, MD    Inpatient Medications: Scheduled Meds: . carvedilol  6.25 mg  Oral BID WC  . cloNIDine  0.1 mg Oral BID  . fenofibrate  160 mg Oral Daily  . furosemide  40 mg Intravenous Q12H  . heparin  4,000 Units Intravenous Once  . hydrochlorothiazide  25 mg Oral Daily  . insulin aspart  0-9 Units Subcutaneous TID WC  . insulin detemir  10 Units Subcutaneous Daily  . losartan  50 mg Oral Daily  . omega-3 acid ethyl esters  1 g Oral BID  . pravastatin  40 mg Oral Daily   Continuous Infusions: . heparin     PRN Meds: acetaminophen **OR** acetaminophen, albuterol, ondansetron **OR** ondansetron (ZOFRAN) IV, oxyCODONE-acetaminophen, traMADol  Allergies:    Allergies  Allergen Reactions  . Lisinopril Cough         Social History:   Social History   Socioeconomic History  . Marital status: Married    Spouse name: Not on file  . Number of children: Not on file  . Years of education: Not on file  . Highest education level: Not on file  Occupational History  . Not on file  Social Needs  . Financial resource strain: Not on  file  . Food insecurity    Worry: Not on file    Inability: Not on file  . Transportation needs    Medical: Not on file    Non-medical: Not on file  Tobacco Use  . Smoking status: Former Smoker    Packs/day: 2.00    Years: 35.00    Pack years: 70.00    Types: Cigarettes    Quit date: 07/12/1996    Years since quitting: 22.6  . Smokeless tobacco: Never Used  Substance and Sexual Activity  . Alcohol use: Yes    Comment: occasionally  . Drug use: Never  . Sexual activity: Not Currently  Lifestyle  . Physical activity    Days per week: Not on file    Minutes per session: Not on file  . Stress: Not on file  Relationships  . Social Herbalist on phone: Not on file    Gets together: Not on file    Attends religious service: Not on file    Active member of club or organization: Not on file    Attends meetings of clubs or organizations: Not on file    Relationship status: Not on file  . Intimate partner violence    Fear of current or ex partner: Not on file    Emotionally abused: Not on file    Physically abused: Not on file    Forced sexual activity: Not on file  Other Topics Concern  . Not on file  Social History Narrative   05-15-18 Unable to ask abuse questions wife with him today    Family History:   Family History  Problem Relation Age of Onset  . Breast cancer Mother   . CAD Father      ROS:  Please see the history of present illness.  All other ROS reviewed and negative.     Physical Exam/Data:   Vitals:   02/23/19 0033 02/23/19 0413 02/23/19 0516 02/23/19 0630  BP: 113/77 128/79 130/85 134/72  Pulse: 78 (!) 55  (!) 55  Resp: 18 19 12  (!) 25  Temp:    97.9 F (36.6 C)  TempSrc:    Oral  SpO2: 100% 100%  100%  Weight:    126 kg  Height:    5\' 9"  (1.753 m)   No intake or  output data in the 24 hours ending 02/23/19 0811 Last 3 Weights 02/23/2019 02/19/2019 02/05/2019  Weight (lbs) 277 lb 12.5 oz 277 lb 12.8 oz 271 lb 12.8 oz  Weight (kg) 126 kg  126.009 kg 123.288 kg     Body mass index is 41.02 kg/m.  General:  Well nourished, well developed obese WM, in no acute distress HEENT: normal Lymph: no adenopathy Neck: moderate JVD, difficult to assess due to body habitus Endocrine:  No thryomegaly Vascular: No carotid bruits; pedal pulses difficult to find; cap refill normal  Cardiac:  normal S1, S2; RRR; no murmur  Lungs: diffuse expiratory wheezing, rhonchi or rales  Abd: firm, nontender, no hepatomegaly  Ext: 4+ pitting edema bilateral lower legs  Musculoskeletal:  No deformities, BUE and BLE strength normal and equal Skin: warm and dry  Neuro:  CNs 2-12 intact, no focal abnormalities noted Psych:  Normal affect   EKG:  The EKG was personally reviewed and demonstrates:  Atrial fibrillation, 65 bpm, diffuse TW flattening Telemetry:  Telemetry was personally reviewed and demonstrates:  Atrial fibrillation, rates in the 80s, occasional PVCs  Relevant CV Studies:  Echo pending  Laboratory Data:  High Sensitivity Troponin:   Recent Labs  Lab 02/22/19 2120 02/22/19 2151  TROPONINIHS 6 6     Cardiac EnzymesNo results for input(s): TROPONINI in the last 168 hours. No results for input(s): TROPIPOC in the last 168 hours.  Chemistry Recent Labs  Lab 02/19/19 0845 02/22/19 2120  NA 139 134*  K 3.8 3.5  CL 104 101  CO2 23 25  GLUCOSE 131* 102*  BUN 44* 50*  CREATININE 1.94* 1.94*  CALCIUM 9.6 9.2  GFRNONAA 34* 34*  GFRAA 39* 39*  ANIONGAP 12 8    Recent Labs  Lab 02/19/19 0845 02/22/19 2120  PROT 6.9 6.8  ALBUMIN 3.3* 3.3*  AST 23 26  ALT 12 17  ALKPHOS 48 47  BILITOT 0.7 0.9   Hematology Recent Labs  Lab 02/19/19 0845 02/22/19 2120  WBC 3.1* 2.9*  RBC 3.00* 2.95*  HGB 8.8* 8.6*  HCT 27.2* 27.3*  MCV 90.7 92.5  MCH 29.3 29.2  MCHC 32.4 31.5  RDW 18.0* 18.1*  PLT 163 164   BNP Recent Labs  Lab 02/22/19 2120  BNP 501.4*    DDimer No results for input(s): DDIMER in the last 168  hours.   Radiology/Studies:  Dg Chest 2 View  Result Date: 02/22/2019 CLINICAL DATA:  Shortness of breath. EXAM: CHEST - 2 VIEW COMPARISON:  January 31, 2019 FINDINGS: Injectable port in stable position. Normal cardiac silhouette. Stable volume loss in the right hemithorax with loculated pleural effusion. Stable soft tissue thickening in the right perihilar region. The left lung is clear. Osseous structures are without acute abnormality. Soft tissues are grossly normal. IMPRESSION: 1. Stable volume loss in the right hemithorax with loculated pleural effusion. 2. Stable soft tissue thickening in the right perihilar region. Electronically Signed   By: Fidela Salisbury M.D.   On: 02/22/2019 15:58    Assessment and Plan:   New onset CHF Patient presents to the ED with progressive SOB and peripheral edema over the last 2-3 weeks. Patient has no previous cardiac histroy. Patient was taking lasix 20 mg for the last 3 days with no relief. In the ER BNP 501. CXR with no acute abnormalities. Patient found to be in new onset afib, rate controlled in the 60s.  - Suspect symptoms are related to acute heart failure although symptoms also  might be exacerbated by new onset afib.  - Echo pending - Home meds include Coreg 6.25 BID, Losartan 5 mg daily, HCTZ 25 mg daily - Weight on admission 277 lbs. Baseline 250 lbs - Patient is currently on IV Lasix 40 mg BID. Patient is severely fluid overloaded. Can consider increasing the dose as long as kidneys are stable. I& Os are not documented and BMET has not been drawn. Will leave dose for now.  - Monitor Creatinine. Today level 1.94. ?elevation possibly due to severe edema. - Continue Coreg.  - Will discontinue Losartan and HCTZ for renal insufficiency. Will reassess once kidneys are stable and we are further along with diuresis. Pressures so far stable. Suspect patient will go home on daily Lasix.  - Daily weights - I&Os. So far no volume status has been recorded.   - Low salt diet  New onset Atrial fibrillation - In the ED EKG showed afib, 65 bpm. Patient denies known history of afib. Looking back at previous EKGs there is no evidence of this.  - TSH elevated at 5.561, will check FT4 - Potassium 3.5, Magnesium 2.1 - On tele patient continues to be in afib - Currently rate controlled on Coreg twice daily. Continue - On IV heparin for a/c. Will most likely keep this for now until echo results are final. If echo does not show any gross abnormality then can likely switch patient to [place] = 4 (CHF, HTN, DM2, age >27) - Will get 12-lead this am - Continue to monitor on telemetry - I doubt patient will need cardioversion during this admission. I suspect sob/edema symptoms are more likely due to acute heart failure, although can be exacerbated by new arrhythmia. If sob does not improve with diuresis can consider TEE/CDDV.  CKD stage III - Appears creatinine is slightly elevated from baseline, although difficult to assess. Earlier in the year it seems levels were 1.4-1.6 - On this admission creatinine 1.94 - Continue to monitor - Avoid nephrotoxic agents - Will discontinue ARB and HCTZ for now. Will reassess once kidneys are stable and we are further along with diuresis.   Hypertension - Home meds include Coreg, HCTZ, and Losartan, and Clonidine - Pressures stable - Will discontinue ARB and HCTZ for now. Monitor pressures during diuresis. Suspect pressures will drop as we continue diuresis.   Anemia - Appears to be chronic due to h/o of cancer and immunotherapy - Baseline 8-10 - Hgb 8.6 on admission  Stage III non-small cell lung cancer, diagnosed July 2019. - Followed by Dr. Earlie Server, on immunotherapy - H/o of Pleurx drain  DM2 - SS per IM  H/o of Thoracic aortic aneurysm - Incidental finding. No active chest pain - Recommend annual imaging by CTA or MRA. - Follow-up as an outpatient.  Aortic Atherosclerosis - LDL 46   04/04/2018 - Pravastatin 40 mg daily  For questions or updates, please contact [place] Please consult [email or web address] for contact info under    Signed, Cadence Ninfa Meeker, PA-C  02/23/2019 8:11 AM  Personally seen and examined. Agree with above with one exception, I do not think that he has atrial fibrillation.   SOB noted. Diuresing. No CP.   GEN: Well nourished, well developed, in no acute distress  HEENT: normal  Neck: no JVD, carotid bruits, or masses Cardiac: RRR; no murmurs, rubs, or gallops, 3+ edema  Respiratory:  Crackles bases bilaterally, normal work of breathing GI: soft, nontender, nondistended, + BS MS: no deformity or atrophy  Skin: warm and dry, no rash Neuro:  Alert and Oriented x 3, Strength and sensation are intact Psych: euthymic mood, full affect   Labs, ECG, TELE personally reviewed.  Normal sinus rhythm with PACs noted throughout telemetry.  This has been mislabeled as atrial fibrillation.  EKG also personally reviewed shows small amplitude P waves preceding each QRS complex with PACs.  This is not atrial fibrillation.   A/P  Heart failure (unknown type at this point)  - Agree with IV lasix  - Watch Creat.   - Stopping ARB and HCTZ  - If systolic dysfunction - consider Entresto when able   Sinus rhythm with PACs (not atrial fibrillation)  -Discontinuing IV heparin.  ECG, TELE personally reviewed.  Normal sinus rhythm with PACs noted throughout telemetry.  This has been mislabeled as atrial fibrillation.  EKG also personally reviewed shows small amplitude P waves preceding each QRS complex with PACs.  This is not atrial fibrillation.  He does not require anticoagulation.  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

## 2019-02-23 NOTE — Progress Notes (Signed)
73 year old with stage 3 squamous cell lung ca, recurrent pleural effusion s/o right pleurex catheter, DM, stage 3 CKD, hypertension, presents to ED with sob and worsening leg edema.    Please see Dr Moise Boring note for detailed H&P earlier this am.   Pt seen and examined at bedside  Alert and appear comfortable on  oxygen 2 lit. But he reports not feeling better since yesterday.  CVS s1s2, irregular,  Lungs diminished at bases, no wheezing heard.  abd is soft NT ND bs+ Extremities 3+ leg edema.  Alert and oriented and answering questions appropriately.    Plan:  Continue with IV lasix and IV heparin.  Cardiology consulted for further recommendations and plan.    Hosie Poisson. MD [phone]

## 2019-02-24 ENCOUNTER — Other Ambulatory Visit: Payer: Self-pay

## 2019-02-24 DIAGNOSIS — I5031 Acute diastolic (congestive) heart failure: Secondary | ICD-10-CM

## 2019-02-24 DIAGNOSIS — I480 Paroxysmal atrial fibrillation: Secondary | ICD-10-CM

## 2019-02-24 DIAGNOSIS — J449 Chronic obstructive pulmonary disease, unspecified: Secondary | ICD-10-CM

## 2019-02-24 LAB — BASIC METABOLIC PANEL
Anion gap: 11 (ref 5–15)
Anion gap: 12 (ref 5–15)
BUN: 48 mg/dL — ABNORMAL HIGH (ref 8–23)
BUN: 49 mg/dL — ABNORMAL HIGH (ref 8–23)
CO2: 26 mmol/L (ref 22–32)
CO2: 28 mmol/L (ref 22–32)
Calcium: 9.1 mg/dL (ref 8.9–10.3)
Calcium: 9.2 mg/dL (ref 8.9–10.3)
Chloride: 97 mmol/L — ABNORMAL LOW (ref 98–111)
Chloride: 96 mmol/L — ABNORMAL LOW (ref 98–111)
Creatinine, Ser: 1.64 mg/dL — ABNORMAL HIGH (ref 0.61–1.24)
Creatinine, Ser: 1.78 mg/dL — ABNORMAL HIGH (ref 0.61–1.24)
GFR calc Af Amer: 48 mL/min — ABNORMAL LOW (ref >60)
GFR calc Af Amer: 44 mL/min — ABNORMAL LOW (ref >60)
GFR calc non Af Amer: 42 mL/min — ABNORMAL LOW (ref >60)
GFR calc non Af Amer: 38 mL/min — ABNORMAL LOW (ref >60)
Glucose, Bld: 103 mg/dL — ABNORMAL HIGH (ref 70–99)
Glucose, Bld: 120 mg/dL — ABNORMAL HIGH (ref 70–99)
Potassium: 2.8 mmol/L — ABNORMAL LOW (ref 3.5–5.1)
Potassium: 3 mmol/L — ABNORMAL LOW (ref 3.5–5.1)
Sodium: 134 mmol/L — ABNORMAL LOW (ref 135–145)
Sodium: 136 mmol/L (ref 135–145)

## 2019-02-24 LAB — MAGNESIUM: Magnesium: 2.1 mg/dL (ref 1.7–2.4)

## 2019-02-24 LAB — GLUCOSE, CAPILLARY
Glucose-Capillary: 96 mg/dL (ref 70–99)
Glucose-Capillary: 139 mg/dL — ABNORMAL HIGH (ref 70–99)
Glucose-Capillary: 111 mg/dL — ABNORMAL HIGH (ref 70–99)
Glucose-Capillary: 129 mg/dL — ABNORMAL HIGH (ref 70–99)

## 2019-02-24 MED ORDER — SODIUM CHLORIDE 0.9% FLUSH
10.0000 mL | INTRAVENOUS | Status: DC | PRN
  Administered 2019-02-28: 10 mL
  Filled 2019-02-24: qty 40

## 2019-02-24 MED ORDER — POTASSIUM CHLORIDE CRYS ER 20 MEQ PO TBCR
40.0000 meq | EXTENDED_RELEASE_TABLET | Freq: Two times a day (BID) | ORAL | Status: AC
  Administered 2019-02-24 (×2): 40 meq via ORAL
  Filled 2019-02-24 (×2): qty 2

## 2019-02-24 MED ORDER — APIXABAN 5 MG PO TABS
5.0000 mg | ORAL_TABLET | Freq: Two times a day (BID) | ORAL | Status: DC
  Administered 2019-02-24 – 2019-02-27 (×6): 5 mg via ORAL
  Filled 2019-02-24 (×6): qty 1

## 2019-02-24 NOTE — Progress Notes (Signed)
ANTICOAGULATION CONSULT NOTE - Initial Consult  Pharmacy Consult for apixaban Indication: atrial fibrillation  Allergies  Allergen Reactions  . Lisinopril Cough         Patient Measurements: Height: 5\' 9"  (175.3 cm) Weight: 283 lb 9.6 oz (128.6 kg) IBW/kg (Calculated) : 70.7 Heparin Dosing Weight: 99 kg  Vital Signs: Temp: 97.6 F (36.4 C) (08/15 1313) Temp Source: Oral (08/15 1313) BP: 127/73 (08/15 1313) Pulse Rate: 72 (08/15 1313)  Labs: Recent Labs    02/22/19 2120 02/22/19 2151 02/23/19 0636 02/24/19 0542  HGB 8.6*  --   --   --   HCT 27.3*  --   --   --   PLT 164  --   --   --   CREATININE 1.94*  --  1.94* 1.64*  TROPONINIHS 6 6  --   --     Estimated Creatinine Clearance: 55.7 mL/min (A) (by C-G formula based on SCr of 1.64 mg/dL (H)).   Medical History: Past Medical History:  Diagnosis Date  . Aortic atherosclerosis ([place]) 07/18/2018  . Cancer ([place])   . Cellulitis of right lower extremity   . Chronic kidney disease   . Diabetes mellitus without complication ([place])   . Dyslipidemia   . Hypertension   . Thoracic aortic aneurysm without rupture ([place]) 07/18/2018    Assessment: Pharmacy consulted to dose apixaban for new onset atrial fibrillation. Pt was not on anticoagulation PTA. SCr 1.64, age 73, wt 128.6 kg. BMI 41.88  Plan:  apixaban 5 mg po bid  Eudelia Bunch, Pharm.D [phone] 02/24/2019 4:57 PM

## 2019-02-24 NOTE — Progress Notes (Signed)
PROGRESS NOTE    Darrell [name]  ZOX:[id number] DOB: [birth year] DOA: 02/22/2019 PCP: [name], MD  Brief Narrative:  73 year old with stage 3 squamous cell lung ca, recurrent pleural effusion s/o right pleurex catheter, DM, stage 3 CKD, hypertension, presents to ED with sob and worsening leg edema.  He was admitted for onset atrial fibrillation with RVR and acute on chronic diastolic heart failure.  Cardiology consulted and recommendations to continue anticoagulation with either Xarelto or Eliquis and to continue IV Lasix for now.   Assessment & Plan:   Principal Problem:   Acute CHF (congestive heart failure) (HCC) Active Problems:   DM2 (diabetes mellitus, type 2) (HCC)   HYPERTENSION, BENIGN   Stage III squamous cell cancer   CKD (chronic kidney disease) stage 3, GFR 30-59 ml/min (HCC)   Thoracic aortic aneurysm without rupture (HCC)   COPD (chronic obstructive pulmonary disease) (HCC)   Acute on chronic diastolic heart failure Acute respiratory failure with hypoxia Improving with IV Lasix and diuresis.  Continue with IV Lasix 40 mg twice daily and replete potassium as required.  Continue to monitor electrolytes and renal parameters.  Continue with strict intake and output, daily weights and fluid restriction to 1200 mL/day.  Cardiology consulted and recommendations were to continue with diuresis.  Echocardiogram reviewed and discussed the results with the patient.   Atrial fibrillation with RVR Resolved currently patient is in sinus rhythm. Lovenox discontinued changed to Eliquis. Rate controlled well with Coreg.   Hypertension Well-controlled   Type 2 diabetes mellitus CBG (last 3)  Recent Labs    02/24/19 0737 02/24/19 1155 02/24/19 1644  GLUCAP 96 139* 111*   Continue with Levemir 10 units daily and sliding scale insulin.   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 Recommend outpatient with follow-up with oncology   Hypokalemia replaced and repeat potassium tonight.   COPD No wheezing heard.   Stage III CKD Creatinine back to baseline at 1.6    Anemia of chronic disease Hemoglobin stable around 8  transfuse to keep hemoglobin greater than 7.   DVT prophylaxis: eliquis.  Code Status: full code.  Family Communication:discussed with wife at bedside.  Disposition Plan: pending clinical improvement.  Consultants:   Cardiology.   Procedures:  Echocardiogram showed preserved left ventricular systolic function and impaired relaxation.   Antimicrobials:none.    Subjective: Swelling has improved, breathing better, off oxygen.   Objective: Vitals:   02/23/19 2305 02/24/19 0458 02/24/19 0800 02/24/19 1313  BP: 117/66 130/71  127/73  Pulse: 79 74  72  Resp: 20 20  20   Temp: 97.6 F (36.4 C) 98.5 F (36.9 C)  97.6 F (36.4 C)  TempSrc: Oral Oral  Oral  SpO2: 100% 99%  100%  Weight:   128.6 kg   Height:        Intake/Output Summary (Last 24 hours) at 02/24/2019 1629 Last data filed at 02/24/2019 1006 Gross per 24 hour  Intake 610 ml  Output 3450 ml  Net -2840 ml   Filed Weights   02/23/19 0630 02/24/19 0800  Weight: 126 kg 128.6 kg    Examination:  General exam: Not in  any kind of distress Respiratory system: Diminished air entry at bases but no wheezing or rhonchi Cardiovascular system: S1 & S2 heard, RRR.  3+ leg edema present  gastrointestinal system: Abdomen is soft, obese, organomegaly cannot be appreciated, bowel sounds are good Central nervous  system: Alert and oriented. No focal neurological deficits. Extremities: 3+ leg edema, no cyanosis or clubbing Skin: No rashes, lesions or ulcers Psychiatry:  Mood & affect appropriate.     Data Reviewed: I have personally reviewed following labs and imaging studies  CBC: Recent Labs  Lab 02/19/19 0845 02/22/19 2120  WBC 3.1* 2.9*  NEUTROABS 2.2  2.0  HGB 8.8* 8.6*  HCT 27.2* 27.3*  MCV 90.7 92.5  PLT 163 892   Basic Metabolic Panel: Recent Labs  Lab 02/19/19 0845 02/22/19 2120 02/23/19 0636 02/24/19 0542  NA 139 134* 135 134*  K 3.8 3.5 3.3* 2.8*  CL 104 101 99 97*  CO2 23 25 23 26   GLUCOSE 131* 102* 107* 103*  BUN 44* 50* 54* 48*  CREATININE 1.94* 1.94* 1.94* 1.64*  CALCIUM 9.6 9.2 9.3 9.1  MG  --   --  2.1 2.1   GFR: Estimated Creatinine Clearance: 55.7 mL/min (A) (by C-G formula based on SCr of 1.64 mg/dL (H)). Liver Function Tests: Recent Labs  Lab 02/19/19 0845 02/22/19 2120  AST 23 26  ALT 12 17  ALKPHOS 48 47  BILITOT 0.7 0.9  PROT 6.9 6.8  ALBUMIN 3.3* 3.3*   Recent Labs  Lab 02/22/19 2120  LIPASE 18   No results for input(s): AMMONIA in the last 168 hours. Coagulation Profile: No results for input(s): INR, PROTIME in the last 168 hours. Cardiac Enzymes: No results for input(s): CKTOTAL, CKMB, CKMBINDEX, TROPONINI in the last 168 hours. BNP (last 3 results) No results for input(s): PROBNP in the last 8760 hours. HbA1C: Recent Labs    02/23/19 0636  HGBA1C 5.8*   CBG: Recent Labs  Lab 02/23/19 1148 02/23/19 1725 02/23/19 2059 02/24/19 0737 02/24/19 1155  GLUCAP 96 122* 108* 96 139*   Lipid Profile: No results for input(s): CHOL, HDL, LDLCALC, TRIG, CHOLHDL, LDLDIRECT in the last 72 hours. Thyroid Function Tests: Recent Labs    02/23/19 0636  TSH 5.561*  FREET4 1.39*   Anemia Panel: No results for input(s): VITAMINB12, FOLATE, FERRITIN, TIBC, IRON, RETICCTPCT in the last 72 hours. Sepsis Labs: No results for input(s): PROCALCITON, LATICACIDVEN in the last 168 hours.  Recent Results (from the past 240 hour(s))  SARS Coronavirus 2 [facility] order, Performed in [facility] hospital lab) Nasopharyngeal Nasopharyngeal Swab     Status: None   Collection Time: 02/22/19 11:33 PM   Specimen: Nasopharyngeal Swab  Result Value Ref Range Status   SARS Coronavirus 2 NEGATIVE  NEGATIVE Final    Comment: (NOTE) If result is NEGATIVE SARS-CoV-2 target nucleic acids are NOT DETECTED. The SARS-CoV-2 RNA is generally detectable in upper and lower  respiratory specimens during the acute phase of infection. The lowest  concentration of SARS-CoV-2 viral copies this assay can detect is 250  copies / mL. A negative result does not preclude SARS-CoV-2 infection  and should not be used as the sole basis for treatment or other  patient management decisions.  A negative result may occur with  improper specimen collection / handling, submission of specimen other  than nasopharyngeal swab, presence of viral mutation(s) within the  areas targeted by this assay, and inadequate number of viral copies  (<250 copies / mL). A negative result must be combined with clinical  observations, patient history, and epidemiological information. If result is POSITIVE SARS-CoV-2 target nucleic acids are DETECTED. The SARS-CoV-2 RNA is generally detectable in upper and lower  respiratory specimens dur ing the acute phase of infection.  Positive  results are indicative of active infection with SARS-CoV-2.  Clinical  correlation with patient history and other diagnostic information is  necessary to determine patient infection status.  Positive results do  not rule out bacterial infection or co-infection with other viruses. If result is PRESUMPTIVE POSTIVE SARS-CoV-2 nucleic acids MAY BE PRESENT.   A presumptive positive result was obtained on the submitted specimen  and confirmed on repeat testing.  While 2019 novel coronavirus  (SARS-CoV-2) nucleic acids may be present in the submitted sample  additional confirmatory testing may be necessary for epidemiological  and / or clinical management purposes  to differentiate between  SARS-CoV-2 and other Sarbecovirus currently known to infect humans.  If clinically indicated additional testing with an alternate test  methodology [phone]) is  advised. The SARS-CoV-2 RNA is generally  detectable in upper and lower respiratory sp ecimens during the acute  phase of infection. The expected result is Negative. Fact Sheet for Patients:  StrictlyIdeas.no Fact Sheet for Healthcare Providers: BankingDealers.co.za This test is not yet approved or cleared by the [place] FDA and has been authorized for detection and/or diagnosis of SARS-CoV-2 by FDA under an Emergency Use Authorization (EUA).  This EUA will remain in effect (meaning this test can be used) for the duration of the COVID-19 declaration under Section 564(b)(1) of the Act, 21 U.S.C. section 360bbb-3(b)(1), unless the authorization is terminated or revoked sooner. Performed at [facility], [place] [address] [place], [place] [address]          Radiology Studies: No results found.      Scheduled Meds: . carvedilol  6.25 mg Oral BID WC  . cloNIDine  0.1 mg Oral BID  . enoxaparin (LOVENOX) injection  60 mg Subcutaneous Q24H  . fenofibrate  160 mg Oral Daily  . furosemide  40 mg Intravenous Q12H  . hydrocortisone cream   Topical TID  . insulin aspart  0-9 Units Subcutaneous TID WC  . insulin detemir  10 Units Subcutaneous Daily  . nystatin   Topical TID  . omega-3 acid ethyl esters  1 g Oral BID  . potassium chloride  40 mEq Oral BID  . pravastatin  40 mg Oral Daily   Continuous Infusions:   LOS: 2 days    Time spent: 32 minutes.    Hosie Poisson, MD [facility] Pager [phone]   If 7PM-7AM, please contact night-coverage [email or web address] Password [facility] 02/24/2019, 4:29 PM

## 2019-02-24 NOTE — Progress Notes (Signed)
Unable to get pluerex drain from material management this evening.  Patient is not in distress and states he is fine to wait till tomorrow for pleurex to be drained.  Will make the oncoming RN aware.

## 2019-02-24 NOTE — Discharge Instructions (Signed)

## 2019-02-24 NOTE — Progress Notes (Signed)
Progress Note  Patient Name: Darrell [name] Date of Encounter: 02/24/2019  Primary Cardiologist: Candee Furbish, MD   Subjective   "I am breating better and my legs are not as swollen."  Inpatient Medications    Scheduled Meds: . carvedilol  6.25 mg Oral BID WC  . cloNIDine  0.1 mg Oral BID  . enoxaparin (LOVENOX) injection  60 mg Subcutaneous Q24H  . fenofibrate  160 mg Oral Daily  . furosemide  40 mg Intravenous Q12H  . hydrocortisone cream   Topical TID  . insulin aspart  0-9 Units Subcutaneous TID WC  . insulin detemir  10 Units Subcutaneous Daily  . nystatin   Topical TID  . omega-3 acid ethyl esters  1 g Oral BID  . pravastatin  40 mg Oral Daily   Continuous Infusions:  PRN Meds: acetaminophen **OR** acetaminophen, albuterol, ondansetron **OR** ondansetron (ZOFRAN) IV, oxyCODONE-acetaminophen, traMADol   Vital Signs    Vitals:   02/23/19 2111 02/23/19 2305 02/24/19 0458 02/24/19 0800  BP: 120/67 117/66 130/71   Pulse:  79 74   Resp:  20 20   Temp:  97.6 F (36.4 C) 98.5 F (36.9 C)   TempSrc:  Oral Oral   SpO2:  100% 99%   Weight:    128.6 kg  Height:        Intake/Output Summary (Last 24 hours) at 02/24/2019 0834 Last data filed at 02/24/2019 0500 Gross per 24 hour  Intake 723.41 ml  Output 3175 ml  Net -2451.59 ml   Filed Weights   02/23/19 0630 02/24/19 0800  Weight: 126 kg 128.6 kg    Telemetry    NSR - regular rhythm with noise artifact. Looks like he is back in NSR  ECG    none - Personally Reviewed  Physical Exam   GEN: No acute distress.   Neck: No JVD Cardiac: RRR, no murmurs, rubs, or gallops.  Respiratory: Clear to auscultation bilaterally. GI: Soft, nontender, non-distended  MS: No edema; No deformity. Neuro:  Nonfocal  Psych: Normal affect   Labs    Chemistry Recent Labs  Lab 02/19/19 0845 02/22/19 2120 02/23/19 0636 02/24/19 0542  NA 139 134* 135 134*  K 3.8 3.5 3.3* 2.8*  CL 104 101 99 97*  CO2 23 25 23 26    GLUCOSE 131* 102* 107* 103*  BUN 44* 50* 54* 48*  CREATININE 1.94* 1.94* 1.94* 1.64*  CALCIUM 9.6 9.2 9.3 9.1  PROT 6.9 6.8  --   --   ALBUMIN 3.3* 3.3*  --   --   AST 23 26  --   --   ALT 12 17  --   --   ALKPHOS 48 47  --   --   BILITOT 0.7 0.9  --   --   GFRNONAA 34* 34* 34* 42*  GFRAA 39* 39* 39* 48*  ANIONGAP 12 8 13 11      Hematology Recent Labs  Lab 02/19/19 0845 02/22/19 2120  WBC 3.1* 2.9*  RBC 3.00* 2.95*  HGB 8.8* 8.6*  HCT 27.2* 27.3*  MCV 90.7 92.5  MCH 29.3 29.2  MCHC 32.4 31.5  RDW 18.0* 18.1*  PLT 163 164    Cardiac EnzymesNo results for input(s): TROPONINI in the last 168 hours. No results for input(s): TROPIPOC in the last 168 hours.   BNP Recent Labs  Lab 02/22/19 2120  BNP 501.4*     DDimer No results for input(s): DDIMER in the last 168 hours.  Radiology    Dg Chest 2 View  Result Date: 02/22/2019 CLINICAL DATA:  Shortness of breath. EXAM: CHEST - 2 VIEW COMPARISON:  January 31, 2019 FINDINGS: Injectable port in stable position. Normal cardiac silhouette. Stable volume loss in the right hemithorax with loculated pleural effusion. Stable soft tissue thickening in the right perihilar region. The left lung is clear. Osseous structures are without acute abnormality. Soft tissues are grossly normal. IMPRESSION: 1. Stable volume loss in the right hemithorax with loculated pleural effusion. 2. Stable soft tissue thickening in the right perihilar region. Electronically Signed   By: Fidela Salisbury M.D.   On: 02/22/2019 15:58    Cardiac Studies   Echo - poor study, EF 60%.   Patient Profile     73 y.o. male admitted with volume overload, sob, and new atrial fib.   Assessment & Plan    1. Atrial fib - His tele suggested he had returned to NSR but noise makes interpretation more difficult. Consider 12 lead ECG. I would suggest Eliquis 5 mg bid or Xarelto 20 mg daily for prevention of stroke. Stop lovenox 2. Acute on chronic diastolic heart  failure - his dyspnea is improved. His weight is increased though I suspect that the scales are different. He notes his peripheral edema is less and breating is better. I would continue IV lasix. 3. Hypokalemia - replete with at least 80 meq today. 4. Pleural effusions - he has a pleura vac in place.      For questions or updates, please contact [place] Please consult [email or web address] for contact info under Cardiology/STEMI.      Signed, Cristopher Peru, MD  02/24/2019, 8:34 AM  Patient ID: Darrell [name], male   DOB: 10-25-1945, 73 y.o.   MRN: [id number]

## 2019-02-25 ENCOUNTER — Inpatient Hospital Stay (HOSPITAL_COMMUNITY): Payer: Medicare Other

## 2019-02-25 LAB — GLUCOSE, CAPILLARY
Glucose-Capillary: 104 mg/dL — ABNORMAL HIGH (ref 70–99)
Glucose-Capillary: 132 mg/dL — ABNORMAL HIGH (ref 70–99)
Glucose-Capillary: 130 mg/dL — ABNORMAL HIGH (ref 70–99)
Glucose-Capillary: 132 mg/dL — ABNORMAL HIGH (ref 70–99)

## 2019-02-25 LAB — BASIC METABOLIC PANEL
Anion gap: 12 (ref 5–15)
BUN: 46 mg/dL — ABNORMAL HIGH (ref 8–23)
CO2: 28 mmol/L (ref 22–32)
Calcium: 9.3 mg/dL (ref 8.9–10.3)
Chloride: 95 mmol/L — ABNORMAL LOW (ref 98–111)
Creatinine, Ser: 1.57 mg/dL — ABNORMAL HIGH (ref 0.61–1.24)
GFR calc Af Amer: 51 mL/min — ABNORMAL LOW (ref >60)
GFR calc non Af Amer: 44 mL/min — ABNORMAL LOW (ref >60)
Glucose, Bld: 112 mg/dL — ABNORMAL HIGH (ref 70–99)
Potassium: 3 mmol/L — ABNORMAL LOW (ref 3.5–5.1)
Sodium: 135 mmol/L (ref 135–145)

## 2019-02-25 LAB — MAGNESIUM: Magnesium: 1.9 mg/dL (ref 1.7–2.4)

## 2019-02-25 MED ORDER — SENNOSIDES-DOCUSATE SODIUM 8.6-50 MG PO TABS
2.0000 | ORAL_TABLET | Freq: Two times a day (BID) | ORAL | Status: DC
  Administered 2019-02-25 – 2019-02-28 (×7): 2 via ORAL
  Filled 2019-02-25 (×7): qty 2

## 2019-02-25 MED ORDER — POLYETHYLENE GLYCOL 3350 17 G PO PACK
17.0000 g | PACK | Freq: Every day | ORAL | Status: DC
  Administered 2019-02-25 – 2019-02-28 (×4): 17 g via ORAL
  Filled 2019-02-25 (×4): qty 1

## 2019-02-25 MED ORDER — POTASSIUM CHLORIDE CRYS ER 20 MEQ PO TBCR
40.0000 meq | EXTENDED_RELEASE_TABLET | Freq: Three times a day (TID) | ORAL | Status: AC
  Administered 2019-02-26 (×3): 40 meq via ORAL
  Filled 2019-02-25 (×3): qty 2

## 2019-02-25 MED ORDER — POTASSIUM CHLORIDE CRYS ER 20 MEQ PO TBCR
40.0000 meq | EXTENDED_RELEASE_TABLET | Freq: Two times a day (BID) | ORAL | Status: AC
  Administered 2019-02-25 (×2): 40 meq via ORAL
  Filled 2019-02-25 (×2): qty 2

## 2019-02-25 MED ORDER — FUROSEMIDE 10 MG/ML IJ SOLN
40.0000 mg | Freq: Three times a day (TID) | INTRAMUSCULAR | Status: DC
  Administered 2019-02-25 – 2019-02-26 (×3): 40 mg via INTRAVENOUS
  Filled 2019-02-25 (×3): qty 4

## 2019-02-25 NOTE — Progress Notes (Signed)
PROGRESS NOTE    Darrell [name]  YPP:[id number] DOB: 1945-12-24 DOA: 02/22/2019 PCP: [name], MD  Brief Narrative:  73 year old with stage 3 squamous cell lung ca, recurrent pleural effusion s/o right pleurex catheter, DM, stage 3 CKD, hypertension, presents to ED with sob and worsening leg edema.  He was admitted for onset atrial fibrillation with RVR and acute on chronic diastolic heart failure.  Cardiology consulted and recommendations to continue anticoagulation with  Eliquis and to continue IV Lasix for now. Patient reports left arm swelling and tenderness.  X-rays of the left shoulder and arm were ordered.  Venous duplex of the left upper extremity ordered to rule out DVT   Assessment & Plan:   Principal Problem:   Acute CHF (congestive heart failure) (HCC) Active Problems:   DM2 (diabetes mellitus, type 2) (HCC)   HYPERTENSION, BENIGN   Stage III squamous cell cancer   CKD (chronic kidney disease) stage 3, GFR 30-59 ml/min (HCC)   Thoracic aortic aneurysm without rupture (HCC)   COPD (chronic obstructive pulmonary disease) (HCC)   Acute on chronic diastolic heart failure Acute respiratory failure with hypoxia Improving with IV Lasix and diuresis.  Continue with IV Lasix 40 mg twice daily and replete potassium as required.  Patient has diuresed about 5 L since admission.  Continue to monitor electrolytes and renal parameters.  Continue with strict intake and output, daily weights and fluid restriction to 1200 mL/day.  Cardiology consulted and recommendations were to continue with diuresis.  An extra dose of IV Lasix 40 mg will be given tonight.  Echocardiogram reviewed and discussed the results with the patient.   Atrial fibrillation with RVR Resolved and patient is  in sinus rhythm. Lovenox discontinued and  changed to Eliquis. Rate controlled well with Coreg.   Hypertension Well-controlled   Type 2 diabetes mellitus CBG (last 3)  Recent Labs     02/24/19 2032 02/25/19 0741 02/25/19 1136  GLUCAP 129* 104* 132*   Continue with Levemir 10 units daily and sliding scale insulin.  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 Recommend outpatient with follow-up with oncology   Hypokalemia replaced and added maintenance potassium 40 mg 3 times daily.   COPD No wheezing heard.  Air entry fair repeat chest x-ray ordered for tomorrow   Stage III CKD Creatinine back to baseline at 1.6  patient creatinine at 1.57 today    Anemia of chronic disease Hemoglobin stable around 8  transfuse to keep hemoglobin greater than 7.  Repeat CBC in the morning  Left proximal humerus fracture Follows up with orthopedics and is scheduled to see them in the office next week.   Left arm swelling and left shoulder pain requiring pain medication every 4 hours Venous duplex of the left upper extremity ordered to rule out DVT. X-rays of the shoulder shows increase in impaction of the fracture Orthopedics will be consulted in the morning for further recommendations. Discussed with the patient and the wife to keep the sling back on even at rest.   DVT prophylaxis: eliquis.  Code Status: full code.  Family Communication:discussed with wife at bedside.  Disposition Plan: pending clinical improvement.  Consultants:   Cardiology.   Procedures:  Echocardiogram showed preserved left ventricular systolic function and impaired relaxation.   Antimicrobials:none.    Subjective: Left arm swelling, patient denies any shortness of breath or chest  pain at this time, pedal edema is improving.  Objective: Vitals:   02/24/19 1313 02/24/19 2039 02/25/19 0707 02/25/19 1433  BP: 127/73 117/66 130/73 103/62  Pulse: 72 72 76 75  Resp: 20 16 20 20   Temp: 97.6 F (36.4 C) 98.4 F (36.9 C) 98.7 F (37.1 C) 98.4 F (36.9 C)   TempSrc: Oral Oral Oral Oral  SpO2: 100% 100% 98% 97%  Weight:      Height:        Intake/Output Summary (Last 24 hours) at 02/25/2019 1553 Last data filed at 02/25/2019 1548 Gross per 24 hour  Intake 840 ml  Output 2370 ml  Net -1530 ml   Filed Weights   02/23/19 0630 02/24/19 0800  Weight: 126 kg 128.6 kg    Examination:  General exam: Alert but appears uncomfortable from left arm swelling Respiratory system: Air entry fair, no wheezing or rhonchi Cardiovascular system: S1-S2 heard, regular rate rhythm, continues to have 3+ leg edema gastrointestinal system: Abdomen is soft, nontender, nondistended bowel sounds are good Central nervous system: Alert and oriented to place person and time grossly nonfocal Extremities: 3+ leg edema, no cyanosis or clubbing left upper extremity more swollen when compared to yesterday currently he does not have a sling. Skin: Bruising on the left arm is improving Psychiatry:  Mood & affect appropriate.     Data Reviewed: I have personally reviewed following labs and imaging studies  CBC: Recent Labs  Lab 02/19/19 0845 02/22/19 2120  WBC 3.1* 2.9*  NEUTROABS 2.2 2.0  HGB 8.8* 8.6*  HCT 27.2* 27.3*  MCV 90.7 92.5  PLT 163 893   Basic Metabolic Panel: Recent Labs  Lab 02/22/19 2120 02/23/19 0636 02/24/19 0542 02/24/19 1745 02/25/19 0900  NA 134* 135 134* 136 135  K 3.5 3.3* 2.8* 3.0* 3.0*  CL 101 99 97* 96* 95*  CO2 25 23 26 28 28   GLUCOSE 102* 107* 103* 120* 112*  BUN 50* 54* 48* 49* 46*  CREATININE 1.94* 1.94* 1.64* 1.78* 1.57*  CALCIUM 9.2 9.3 9.1 9.2 9.3  MG  --  2.1 2.1  --  1.9   GFR: Estimated Creatinine Clearance: 58.1 mL/min (A) (by C-G formula based on SCr of 1.57 mg/dL (H)). Liver Function Tests: Recent Labs  Lab 02/19/19 0845 02/22/19 2120  AST 23 26  ALT 12 17  ALKPHOS 48 47  BILITOT 0.7 0.9  PROT 6.9 6.8  ALBUMIN 3.3* 3.3*   Recent Labs  Lab 02/22/19 2120  LIPASE 18   No results for input(s):  AMMONIA in the last 168 hours. Coagulation Profile: No results for input(s): INR, PROTIME in the last 168 hours. Cardiac Enzymes: No results for input(s): CKTOTAL, CKMB, CKMBINDEX, TROPONINI in the last 168 hours. BNP (last 3 results) No results for input(s): PROBNP in the last 8760 hours. HbA1C: Recent Labs    02/23/19 0636  HGBA1C 5.8*   CBG: Recent Labs  Lab 02/24/19 1155 02/24/19 1644 02/24/19 2032 02/25/19 0741 02/25/19 1136  GLUCAP 139* 111* 129* 104* 132*   Lipid Profile: No results for input(s): CHOL, HDL, LDLCALC, TRIG, CHOLHDL, LDLDIRECT in the last 72 hours. Thyroid Function Tests: Recent Labs    02/23/19 0636  TSH 5.561*  FREET4 1.39*   Anemia Panel: No results for input(s): VITAMINB12, FOLATE, FERRITIN, TIBC, IRON, RETICCTPCT in the last 72 hours. Sepsis Labs: No results for input(s): PROCALCITON, LATICACIDVEN in the last 168 hours.  Recent Results (from the past 240 hour(s))  SARS Coronavirus 2 [facility]  order, Performed in [facility] hospital lab) Nasopharyngeal Nasopharyngeal Swab     Status: None   Collection Time: 02/22/19 11:33 PM   Specimen: Nasopharyngeal Swab  Result Value Ref Range Status   SARS Coronavirus 2 NEGATIVE NEGATIVE Final    Comment: (NOTE) If result is NEGATIVE SARS-CoV-2 target nucleic acids are NOT DETECTED. The SARS-CoV-2 RNA is generally detectable in upper and lower  respiratory specimens during the acute phase of infection. The lowest  concentration of SARS-CoV-2 viral copies this assay can detect is 250  copies / mL. A negative result does not preclude SARS-CoV-2 infection  and should not be used as the sole basis for treatment or other  patient management decisions.  A negative result may occur with  improper specimen collection / handling, submission of specimen other  than nasopharyngeal swab, presence of viral mutation(s) within the  areas targeted by this assay, and inadequate number of viral copies  (<250 copies /  mL). A negative result must be combined with clinical  observations, patient history, and epidemiological information. If result is POSITIVE SARS-CoV-2 target nucleic acids are DETECTED. The SARS-CoV-2 RNA is generally detectable in upper and lower  respiratory specimens dur ing the acute phase of infection.  Positive  results are indicative of active infection with SARS-CoV-2.  Clinical  correlation with patient history and other diagnostic information is  necessary to determine patient infection status.  Positive results do  not rule out bacterial infection or co-infection with other viruses. If result is PRESUMPTIVE POSTIVE SARS-CoV-2 nucleic acids MAY BE PRESENT.   A presumptive positive result was obtained on the submitted specimen  and confirmed on repeat testing.  While 2019 novel coronavirus  (SARS-CoV-2) nucleic acids may be present in the submitted sample  additional confirmatory testing may be necessary for epidemiological  and / or clinical management purposes  to differentiate between  SARS-CoV-2 and other Sarbecovirus currently known to infect humans.  If clinically indicated additional testing with an alternate test  methodology [id number]) is advised. The SARS-CoV-2 RNA is generally  detectable in upper and lower respiratory sp ecimens during the acute  phase of infection. The expected result is Negative. Fact Sheet for Patients:  StrictlyIdeas.no Fact Sheet for Healthcare Providers: BankingDealers.co.za This test is not yet approved or cleared by the [place] FDA and has been authorized for detection and/or diagnosis of SARS-CoV-2 by FDA under an Emergency Use Authorization (EUA).  This EUA will remain in effect (meaning this test can be used) for the duration of the COVID-19 declaration under Section 564(b)(1) of the Act, 21 U.S.C. section 360bbb-3(b)(1), unless the authorization is terminated or revoked  sooner. Performed at [facility], [place] [address], [place], [place] [address]          Radiology Studies: No results found.      Scheduled Meds: . apixaban  5 mg Oral BID  . carvedilol  6.25 mg Oral BID WC  . cloNIDine  0.1 mg Oral BID  . fenofibrate  160 mg Oral Daily  . furosemide  40 mg Intravenous Q12H  . hydrocortisone cream   Topical TID  . insulin aspart  0-9 Units Subcutaneous TID WC  . insulin detemir  10 Units Subcutaneous Daily  . nystatin   Topical TID  . omega-3 acid ethyl esters  1 g Oral BID  . polyethylene glycol  17 g Oral Daily  . potassium chloride  40 mEq Oral BID  . pravastatin  40 mg Oral Daily  .  senna-docusate  2 tablet Oral BID   Continuous Infusions:   LOS: 3 days    Time spent: 36 minutes.    Hosie Poisson, MD [facility] Pager [phone]   If 7PM-7AM, please contact night-coverage [email or web address] Password [facility] 02/25/2019, 3:53 PM

## 2019-02-25 NOTE — Evaluation (Signed)
Physical Therapy Evaluation Patient Details Name: Darrell [name] MRN: [id number] DOB: 10/18/1945 Today's Date: 02/25/2019   History of Present Illness  73 yo male admitted with CHF, L shoulder pain (sustained shoulder fx after fall on 7/21). Hx of lung ca, DM, CKD, recurrent pleural effusion-R pleurx catheter, AA, CHF, LE edema, fall, A fib  Clinical Impression  On eval, pt was Min guard-Min assist for mobility. He walked ~120 feet with a quad cane. Dyspnea 2/4 with ambulation. Pt tolerated activity fairly well. Encouraged pt to try to walk 2x/day with family or nursing supervision. Will continue to follow and progress as tolerated.     Follow Up Recommendations Supervision/Assistance - 24 hour(pt politely declines HHPT f/u)    Equipment Recommendations  None recommended by PT    Recommendations for Other Services       Precautions / Restrictions Precautions Precautions: Fall Precaution Comments: pleurx catheter Required Braces or Orthoses: Sling(L UE) Restrictions Weight Bearing Restrictions: No      Mobility  Bed Mobility               General bed mobility comments: oob in recliner  Transfers Overall transfer level: Needs assistance Equipment used: Quad cane Transfers: Sit to/from Stand Sit to Stand: Min assist         General transfer comment: Assist to rise from low recliner. Increased time.  Ambulation/Gait Ambulation/Gait assistance: Min guard Gait Distance (Feet): 120 Feet Assistive device: Quad cane Gait Pattern/deviations: Step-through pattern;Decreased stride length     General Gait Details: fair gait speed. 1 standing rest break after ~60 feet 2* dyspnea 2/4.  Stairs            Wheelchair Mobility    Modified Rankin (Stroke Patients Only)       Balance Overall balance assessment: History of Falls;Needs assistance           Standing balance-Leahy Scale: Fair                               Pertinent Vitals/Pain  Pain Assessment: Faces Faces Pain Scale: Hurts little more Pain Location: L shoulder Pain Descriptors / Indicators: Aching;Sore Pain Intervention(s): Monitored during session    Home Living Family/patient expects to be discharged to:: Private residence Living Arrangements: Spouse/significant other Available Help at Discharge: Family   Home Access: Level entry     Home Layout: One level Home Equipment: Cane - quad      Prior Function Level of Independence: Independent with assistive device(s)         Comments: using quad cane for longer distances     Hand Dominance        Extremity/Trunk Assessment   Upper Extremity Assessment Upper Extremity Assessment: LUE deficits/detail LUE Deficits / Details: sling 2* shoulder fx    Lower Extremity Assessment Lower Extremity Assessment: Generalized weakness    Cervical / Trunk Assessment Cervical / Trunk Assessment: Normal  Communication   Communication: No difficulties  Cognition Arousal/Alertness: Awake/alert Behavior During Therapy: WFL for tasks assessed/performed Overall Cognitive Status: Within Functional Limits for tasks assessed                                        General Comments      Exercises     Assessment/Plan    PT Assessment Patient needs continued PT services  PT  Problem List Decreased strength;Decreased mobility;Decreased activity tolerance;Decreased balance;Pain;Obesity       PT Treatment Interventions DME instruction;Gait training;Therapeutic activities;Therapeutic exercise;Patient/family education;Balance training;Functional mobility training    PT Goals (Current goals can be found in the Care Plan section)  Acute Rehab PT Goals Patient Stated Goal: home soon PT Goal Formulation: With patient Time For Goal Achievement: 03/11/19 Potential to Achieve Goals: Good    Frequency Min 3X/week   Barriers to discharge        Co-evaluation               AM-PAC PT  "6 Clicks" Mobility  Outcome Measure Help needed turning from your back to your side while in a flat bed without using bedrails?: A Little Help needed moving from lying on your back to sitting on the side of a flat bed without using bedrails?: A Little Help needed moving to and from a bed to a chair (including a wheelchair)?: A Little Help needed standing up from a chair using your arms (e.g., wheelchair or bedside chair)?: A Little Help needed to walk in hospital room?: A Little Help needed climbing 3-5 steps with a railing? : A Little 6 Click Score: 18    End of Session Equipment Utilized During Treatment: Gait belt Activity Tolerance: Patient tolerated treatment well Patient left: in chair;with call bell/phone within reach;with family/visitor present   PT Visit Diagnosis: Unsteadiness on feet (R26.81);History of falling (Z91.81)    Time: 1515-1540 PT Time Calculation (min) (ACUTE ONLY): 25 min   Charges:   PT Evaluation $PT Eval Moderate Complexity: 1 Mod PT Treatments $Gait Training: 8-22 mins         Weston Anna, PT Acute Rehabilitation Services Pager: [phone] Office: [phone]

## 2019-02-25 NOTE — Progress Notes (Signed)
Progress Note  Patient Name: Darrell [name] Date of Encounter: 02/25/2019  Primary Cardiologist: Candee Furbish, MD   Subjective   No change. Denies sob.   Inpatient Medications    Scheduled Meds: . apixaban  5 mg Oral BID  . carvedilol  6.25 mg Oral BID WC  . cloNIDine  0.1 mg Oral BID  . fenofibrate  160 mg Oral Daily  . furosemide  40 mg Intravenous Q12H  . hydrocortisone cream   Topical TID  . insulin aspart  0-9 Units Subcutaneous TID WC  . insulin detemir  10 Units Subcutaneous Daily  . nystatin   Topical TID  . omega-3 acid ethyl esters  1 g Oral BID  . pravastatin  40 mg Oral Daily   Continuous Infusions:  PRN Meds: acetaminophen **OR** acetaminophen, albuterol, ondansetron **OR** ondansetron (ZOFRAN) IV, oxyCODONE-acetaminophen, sodium chloride flush, traMADol   Vital Signs    Vitals:   02/24/19 0800 02/24/19 1313 02/24/19 2039 02/25/19 0707  BP:  127/73 117/66 130/73  Pulse:  72 72 76  Resp:  20 16 20   Temp:  97.6 F (36.4 C) 98.4 F (36.9 C) 98.7 F (37.1 C)  TempSrc:  Oral Oral Oral  SpO2:  100% 100% 98%  Weight: 128.6 kg     Height:        Intake/Output Summary (Last 24 hours) at 02/25/2019 0901 Last data filed at 02/24/2019 2038 Gross per 24 hour  Intake 600 ml  Output 2720 ml  Net -2120 ml   Filed Weights   02/23/19 0630 02/24/19 0800  Weight: 126 kg 128.6 kg    Telemetry    nsr with noise artifact - Personally Reviewed  ECG    none - Personally Reviewed  Physical Exam   GEN: obese, no acute distress.   Neck: No JVD Cardiac: RRR, no murmurs, rubs, or gallops.  Respiratory: rales on the right, and in left base. No wheezes. GI: Soft, obese, nontender, non-distended  MS: 2+ edema; No deformity. Neuro:  Nonfocal  Psych: Normal affect   Labs    Chemistry Recent Labs  Lab 02/19/19 0845  02/22/19 2120 02/23/19 0636 02/24/19 0542 02/24/19 1745  NA 139  --  134* 135 134* 136  K 3.8  --  3.5 3.3* 2.8* 3.0*  CL 104  --  101  99 97* 96*  CO2 23  --  25 23 26 28   GLUCOSE 131*  --  102* 107* 103* 120*  BUN 44*  --  50* 54* 48* 49*  CREATININE 1.94*   < > 1.94* 1.94* 1.64* 1.78*  CALCIUM 9.6  --  9.2 9.3 9.1 9.2  PROT 6.9  --  6.8  --   --   --   ALBUMIN 3.3*  --  3.3*  --   --   --   AST 23  --  26  --   --   --   ALT 12  --  17  --   --   --   ALKPHOS 48  --  47  --   --   --   BILITOT 0.7  --  0.9  --   --   --   GFRNONAA 34*   < > 34* 34* 42* 38*  GFRAA 39*   < > 39* 39* 48* 44*  ANIONGAP 12  --  8 13 11 12    < > = values in this interval not displayed.     Hematology Recent  Labs  Lab 02/19/19 0845 02/22/19 2120  WBC 3.1* 2.9*  RBC 3.00* 2.95*  HGB 8.8* 8.6*  HCT 27.2* 27.3*  MCV 90.7 92.5  MCH 29.3 29.2  MCHC 32.4 31.5  RDW 18.0* 18.1*  PLT 163 164    Cardiac EnzymesNo results for input(s): TROPONINI in the last 168 hours. No results for input(s): TROPIPOC in the last 168 hours.   BNP Recent Labs  Lab 02/22/19 2120  BNP 501.4*     DDimer No results for input(s): DDIMER in the last 168 hours.   Radiology    No results found.  Cardiac Studies   none  Patient Profile     73 y.o. male admitted with volume overload and atrial fib  Assessment & Plan    1. New onset atrial fib - he has reverted back to NSR. He has been started on eliquis.  2. Acute on chronic diastolic heart failure - he is improved clinically although his weight did not come down. Continue IV lasix 40 bid 3. Hypokalemia - potassium is still low. Would give 40 po bid and 40 IV 4. Acute on chronic renal insufficiency stage 3 - creatinine is stable. Follow.     For questions or updates, please contact [place] Please consult [email or web address] for contact info under Cardiology/STEMI.   Signed, Cristopher Peru, MD  02/25/2019, 9:01 AM  Patient ID: Darrell [name], male   DOB: [birth year], 73 y.o.   MRN: [id number]

## 2019-02-26 ENCOUNTER — Encounter (HOSPITAL_COMMUNITY): Payer: Medicare Other

## 2019-02-26 ENCOUNTER — Inpatient Hospital Stay (HOSPITAL_COMMUNITY): Payer: Medicare Other

## 2019-02-26 ENCOUNTER — Telehealth: Payer: Self-pay | Admitting: Radiology

## 2019-02-26 DIAGNOSIS — N171 Acute kidney failure with acute cortical necrosis: Secondary | ICD-10-CM

## 2019-02-26 DIAGNOSIS — N184 Chronic kidney disease, stage 4 (severe): Secondary | ICD-10-CM

## 2019-02-26 DIAGNOSIS — I5033 Acute on chronic diastolic (congestive) heart failure: Secondary | ICD-10-CM

## 2019-02-26 LAB — CBC WITH DIFFERENTIAL/PLATELET
Abs Immature Granulocytes: 0.01 10*3/uL (ref 0.00–0.07)
Basophils Absolute: 0 10*3/uL (ref 0.0–0.1)
Basophils Relative: 1 %
Eosinophils Absolute: 0.4 10*3/uL (ref 0.0–0.5)
Eosinophils Relative: 12 %
HCT: 27.2 % — ABNORMAL LOW (ref 39.0–52.0)
Hemoglobin: 8.7 g/dL — ABNORMAL LOW (ref 13.0–17.0)
Immature Granulocytes: 0 %
Lymphocytes Relative: 11 %
Lymphs Abs: 0.4 10*3/uL — ABNORMAL LOW (ref 0.7–4.0)
MCH: 29.1 pg (ref 26.0–34.0)
MCHC: 32 g/dL (ref 30.0–36.0)
MCV: 91 fL (ref 80.0–100.0)
Monocytes Absolute: 0.5 10*3/uL (ref 0.1–1.0)
Monocytes Relative: 13 %
Neutro Abs: 2.1 10*3/uL (ref 1.7–7.7)
Neutrophils Relative %: 63 %
Platelets: 194 10*3/uL (ref 150–400)
RBC: 2.99 MIL/uL — ABNORMAL LOW (ref 4.22–5.81)
RDW: 18.1 % — ABNORMAL HIGH (ref 11.5–15.5)
WBC: 3.4 10*3/uL — ABNORMAL LOW (ref 4.0–10.5)
nRBC: 0 % (ref 0.0–0.2)

## 2019-02-26 LAB — BASIC METABOLIC PANEL
Anion gap: 15 (ref 5–15)
BUN: 51 mg/dL — ABNORMAL HIGH (ref 8–23)
CO2: 27 mmol/L (ref 22–32)
Calcium: 9.3 mg/dL (ref 8.9–10.3)
Chloride: 96 mmol/L — ABNORMAL LOW (ref 98–111)
Creatinine, Ser: 1.75 mg/dL — ABNORMAL HIGH (ref 0.61–1.24)
GFR calc Af Amer: 45 mL/min — ABNORMAL LOW (ref >60)
GFR calc non Af Amer: 39 mL/min — ABNORMAL LOW (ref >60)
Glucose, Bld: 113 mg/dL — ABNORMAL HIGH (ref 70–99)
Potassium: 3.7 mmol/L (ref 3.5–5.1)
Sodium: 138 mmol/L (ref 135–145)

## 2019-02-26 LAB — GLUCOSE, CAPILLARY
Glucose-Capillary: 102 mg/dL — ABNORMAL HIGH (ref 70–99)
Glucose-Capillary: 116 mg/dL — ABNORMAL HIGH (ref 70–99)
Glucose-Capillary: 124 mg/dL — ABNORMAL HIGH (ref 70–99)
Glucose-Capillary: 140 mg/dL — ABNORMAL HIGH (ref 70–99)

## 2019-02-26 LAB — MAGNESIUM: Magnesium: 1.9 mg/dL (ref 1.7–2.4)

## 2019-02-26 MED ORDER — FUROSEMIDE 10 MG/ML IJ SOLN
60.0000 mg | Freq: Three times a day (TID) | INTRAMUSCULAR | Status: DC
  Administered 2019-02-26 – 2019-02-27 (×2): 60 mg via INTRAVENOUS
  Filled 2019-02-26 (×2): qty 6

## 2019-02-26 NOTE — Telephone Encounter (Signed)
I called discussed with Dr. Emilia Beck, pt in hospital with CHF. Shoulder  Hurting , but not using sling during the day. Pain meds not working good but pt noncompliant with sling. He will F/U with me as scheduled. FYI

## 2019-02-26 NOTE — Progress Notes (Addendum)
Progress Note  Patient Name: Darrell [name] Date of Encounter: 02/26/2019  Primary Cardiologist: Candee Furbish, MD   Subjective   Improved from a respiratory standpoint. No resting dyspnea. Denies CP. No palpitations. Good UOP but still w/ significant LEE.  Left arm pain and swelling has increased s/p mechanica fall w/ humerus fracture 3 weeks ago.   Inpatient Medications    Scheduled Meds: . apixaban  5 mg Oral BID  . carvedilol  6.25 mg Oral BID WC  . cloNIDine  0.1 mg Oral BID  . fenofibrate  160 mg Oral Daily  . furosemide  40 mg Intravenous Q8H  . hydrocortisone cream   Topical TID  . insulin aspart  0-9 Units Subcutaneous TID WC  . insulin detemir  10 Units Subcutaneous Daily  . nystatin   Topical TID  . omega-3 acid ethyl esters  1 g Oral BID  . polyethylene glycol  17 g Oral Daily  . potassium chloride  40 mEq Oral TID  . pravastatin  40 mg Oral Daily  . senna-docusate  2 tablet Oral BID   Continuous Infusions:  PRN Meds: acetaminophen **OR** acetaminophen, albuterol, ondansetron **OR** ondansetron (ZOFRAN) IV, oxyCODONE-acetaminophen, sodium chloride flush, traMADol   Vital Signs    Vitals:   02/25/19 1433 02/25/19 2103 02/26/19 0530 02/26/19 0808  BP: 103/62 115/73 119/75 113/66  Pulse: 75 77 74 78  Resp: 20 17 18    Temp: 98.4 F (36.9 C) 98.7 F (37.1 C) 98.4 F (36.9 C)   TempSrc: Oral Oral Oral   SpO2: 97% 100% 100%   Weight:   123.7 kg   Height:        Intake/Output Summary (Last 24 hours) at 02/26/2019 1029 Last data filed at 02/26/2019 0900 Gross per 24 hour  Intake 1200 ml  Output 1700 ml  Net -500 ml   Last 3 Weights 02/26/2019 02/24/2019 02/23/2019  Weight (lbs) 272 lb 12.8 oz 283 lb 9.6 oz 277 lb 12.5 oz  Weight (kg) 123.741 kg 128.64 kg 126 kg     Telemetry    Appears to be sinus but a lot of noise  - Personally Reviewed  ECG    No new EKG to review - Personally Reviewed  Physical Exam   GEN: elderly obese WM in No acute  distress.   Neck: No JVD Cardiac: RRR, no murmurs, rubs, or gallops.  Respiratory: bibasilar crackles. GI: obese, Soft, nontender, non-distended  MS: 2-3+ bilateral LEE, Lt upper extremity edematous and tender  Neuro:  Nonfocal  Psych: Normal affect   Labs    High Sensitivity Troponin:   Recent Labs  Lab 02/22/19 2120 02/22/19 2151  TROPONINIHS 6 6      Cardiac EnzymesNo results for input(s): TROPONINI in the last 168 hours. No results for input(s): TROPIPOC in the last 168 hours.   Chemistry Recent Labs  Lab 02/22/19 2120  02/24/19 1745 02/25/19 0900 02/26/19 0527  NA 134*   < > 136 135 138  K 3.5   < > 3.0* 3.0* 3.7  CL 101   < > 96* 95* 96*  CO2 25   < > 28 28 27   GLUCOSE 102*   < > 120* 112* 113*  BUN 50*   < > 49* 46* 51*  CREATININE 1.94*   < > 1.78* 1.57* 1.75*  CALCIUM 9.2   < > 9.2 9.3 9.3  PROT 6.8  --   --   --   --   ALBUMIN  3.3*  --   --   --   --   AST 26  --   --   --   --   ALT 17  --   --   --   --   ALKPHOS 47  --   --   --   --   BILITOT 0.9  --   --   --   --   GFRNONAA 34*   < > 38* 44* 39*  GFRAA 39*   < > 44* 51* 45*  ANIONGAP 8   < > 12 12 15    < > = values in this interval not displayed.     Hematology Recent Labs  Lab 02/22/19 2120 02/26/19 0527  WBC 2.9* 3.4*  RBC 2.95* 2.99*  HGB 8.6* 8.7*  HCT 27.3* 27.2*  MCV 92.5 91.0  MCH 29.2 29.1  MCHC 31.5 32.0  RDW 18.1* 18.1*  PLT 164 194    BNP Recent Labs  Lab 02/22/19 2120  BNP 501.4*     DDimer No results for input(s): DDIMER in the last 168 hours.   Radiology    Dg Chest Port 1 View  Result Date: 02/26/2019 CLINICAL DATA:  Shortness of breath EXAM: PORTABLE CHEST 1 VIEW COMPARISON:  01/16/2019 chest CT FINDINGS: Decreased pleuroparenchymal opacity at the right base. There is right perihilar radiation fibrosis. Cardiomegaly. Unremarkable left-sided porta catheter. IMPRESSION: 1. Decreased pleural fluid on the right. 2. Right perihilar fibrosis and chronic volume  loss. Electronically Signed   By: Monte Fantasia M.D.   On: 02/26/2019 04:27   Dg Shoulder Left  Result Date: 02/25/2019 CLINICAL DATA:  Pain status post fall EXAM: LEFT SHOULDER - 2+ VIEW COMPARISON:  January 30, 2019 FINDINGS: There is a partially visualized left subclavian Port-A-Cath. Again noted is an impacted fracture of the left humeral head and neck. The fracture appears slightly more impacted on today's exam. There is mild surrounding soft tissue swelling. There is some evidence of mild interval callus formation. There is no glenohumeral dislocation. Osteopenia is noted. IMPRESSION: 1. Again noted is an impacted fracture of the proximal left humerus. There has been slight interval increase in impaction since the prior study. There is minimal surrounding callus formation. 2. No glenohumeral dislocation. Electronically Signed   By: Constance Holster M.D.   On: 02/25/2019 16:50    Cardiac Studies   2D Echo 02/23/19   1. The left ventricle has normal systolic function with an ejection fraction of 60-65%. The cavity size was normal. Left ventricular diastolic function could not be evaluated secondary to atrial fibrillation.  2. Very poor acoustical windows limited evaluation for regional wall motion which could not be assessed.  3. The right ventricle has normal systolic function. The cavity was normal. There is no increase in right ventricular wall thickness.  4. Left atrial size was mildly dilated.  5. The mitral valve is grossly normal.  6. The aortic valve is grossly normal.  7. The aorta is normal unless otherwise noted.  8. The inferior vena cava was dilated in size with <50% respiratory variability.   Patient Profile     Darrell [name] is a 73 y.o. male with a hx of stage III squamous cell carcinoma followed by Dr. Earlie Server on immunotherapy, recurrent pleural effusion with right sided Pleurx catheter placement, DM2, CKD stage III, HTN, thoracic aortic aneurysm who is being seen today  for the evaluation CHF and PACs at the request of Dr. Karleen Hampshire.  Assessment &  Plan    1. Acute Diastolic CHF: pt admitted w/ volume overload and elevated BNP at 501. Echo with normal LVEF. Being treated w/ IV Lasix. Good urinary response with an additional -1.7L out in last 24 hrs. Net I/Os -4.7 L total. SCr 1.7 (baseline 1.5-1.6 but peaked at 1.9 this admit). K normal at 3.7. Breathing improved but he remains volume overloaded with 2-3+ bilateral LEE on exam and bibasilar crackles. Continue IV Lasix. Strict I/O and close monitoring of renal function, electrolytes and BP. Low sodium diet. Also recommend use of TED hoses and elevation to help with LEE.   2. PACs: consult was initially placed for new atrial fibrillation. However pt was seen by Dr. Marlou Porch on 8/14 and he reviewed EKGs and felt that underlying rhythm was sinus w/ PACs, not atrial fibrillation. See consult note from 8/14. Pt also seen by Dr Lovena Le over the weekend and felt to be in NSR, although lots of noise on tele. No indication for a/c from a cardiac standpoint but ? UE DVT. Will continue for now until this has been ruled out.   3. Lt Proximal Humerus Fracture: 2/2 mechanical fall 3 weeks ago. Significant Lt upper extremity edema, which pt reports has increased and pain has worsened. ? DVT? Consider dopplers. He is currently on Eliquis. Further imaging/ management per primary team. May need ortho to re evaluate while inpatient.    4. Stage III CKD: K 1.7 today. Baseline ~1.5-1.6. SCr peaked at 1.9 this admit. Remains volume overloaded and needs additional diuresis w/ IV Lasix. Will continue IV lasix, 40 mg TID. Continues close monitoring w/ daily BMPs.   5. DM: management per IM.   For questions or updates, please contact [place] Please consult [email or web address] for contact info under     Signed, Lyda Jester, PA-C  02/26/2019, 10:29 AM    The patient was seen, examined and discussed with Brittainy M. Rosita Fire, PA-C and I agree  with the above.   The patient remains severely fluid overloaded, I would increase lasix to 60 mg IV Q8H, we will follow closely , crea is improving 1.9->1.7, baseline 1.5-1.6. Weight down to 272 lbs but baseline 255 lbs. I have reviewed his telemetry closely, because there is a lot of of noise it is difficult to distinguish between sinus rhythm with frequent multifocal PACs versus true atrial fibrillation.  I will obtain 12-lead EKG. The patient is on Eliquis already so that would not change his management at this point.  Ena Dawley, MD 02/26/2019

## 2019-02-26 NOTE — Progress Notes (Signed)
PROGRESS NOTE    Darrell [name]  ZJI:[id number] DOB: Jul 25, 1945 DOA: 02/22/2019 PCP: [name], MD  Brief Narrative:  73 year old with stage 3 squamous cell lung ca, recurrent pleural effusion s/o right pleurex catheter, DM, stage 3 CKD, hypertension, presents to ED with sob and worsening leg edema.  He was admitted for onset atrial fibrillation with RVR and acute on chronic diastolic heart failure.  Cardiology consulted and recommendations to continue anticoagulation with  Eliquis and to continue IV Lasix for now. Patient reports left arm swelling and tenderness.  X-rays of the left shoulder and arm were ordered showed increase in the impaction of the fracture.  Venous duplex of the left upper extremity ordered to rule out DVT, ordered but couldn't be done due to pain. Discussed with Dr Lorin Mercy, recommended compliance to shoulder sling and not use the arm and continue with pain meds.    Assessment & Plan:   Principal Problem:   Acute CHF (congestive heart failure) (HCC) Active Problems:   DM2 (diabetes mellitus, type 2) (HCC)   HYPERTENSION, BENIGN   Stage III squamous cell cancer   CKD (chronic kidney disease) stage 3, GFR 30-59 ml/min (HCC)   Thoracic aortic aneurysm without rupture (HCC)   COPD (chronic obstructive pulmonary disease) (HCC)   Acute on chronic diastolic heart failure Acute respiratory failure with hypoxia Improving with IV Lasix and diuresis and replete potassium as required.  Patient has diuresed about 5.5 L since admission. Cardiology increased the lasix to 60 mg IV every 8 hours.   Continue to monitor electrolytes and renal parameters.  Continue with strict intake and output, daily weights and fluid restriction to 1200 mL/day.  Cardiology consulted and recommendations were to continue with diuresis.    Echocardiogram reviewed and discussed the results with the patient and his wife.    Atrial fibrillation with RVR Resolved and patient is  in sinus  rhythm. Lovenox discontinued and  changed to Eliquis. Rate controlled well with Coreg.   Hypertension Well-controlled. Continue with clonidine and coreg.    Type 2 diabetes mellitus CBG (last 3)  Recent Labs    02/26/19 0741 02/26/19 1134 02/26/19 1648  GLUCAP 102* 116* 124*   Continue with Levemir 10 units daily and sliding scale insulin.  No changes in the dosage of Levemir.    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 Recommend outpatient with follow-up with oncology   Hypokalemia replaced and added maintenance potassium 40 mg 3 times daily. Repeat level wnl.    COPD No wheezing heard.  CXR shows improvement in aeration.    Stage III CKD creatnine at 1.75. continue to monitor on IV lasix.     Anemia of chronic disease Hemoglobin stable around 8  transfuse to keep hemoglobin greater than 7.   Left proximal humerus fracture Worsening pain and swelling.  Follows up with orthopedics and is scheduled to see them in the office next week.   Left arm swelling and left shoulder pain requiring pain medication every 4 hours Venous duplex of the left upper extremity ordered to rule out DVT, it couldn't be done.  X-rays of the shoulder shows increase in impaction of the fracture Discussed with the patient and the wife to keep the sling back on even at rest.  Discussed with Dr Lorin Mercy recommended to continue with sling, not to use the arm and use pain medications.    DVT prophylaxis: eliquis.  Code  Status: full code.  Family Communication:none at bedside today. Disposition Plan: pending clinical improvement.  Consultants:   Cardiology.   Dr Lorin Mercy over the phone.   Oncology.   Procedures:  None.   Antimicrobials:none.    Subjective: Left arm swelling is the same, not getting worse, recommended to use the sling. No new complaints today.  Sob improved. No chest pain.    Objective: Vitals:   02/26/19 0530 02/26/19 0808 02/26/19 1410 02/26/19 1607  BP: 119/75 113/66 109/73 126/72  Pulse: 74 78 78 76  Resp: 18  20   Temp: 98.4 F (36.9 C)  97.9 F (36.6 C)   TempSrc: Oral  Oral   SpO2: 100%  92%   Weight: 123.7 kg     Height:        Intake/Output Summary (Last 24 hours) at 02/26/2019 1722 Last data filed at 02/26/2019 1433 Gross per 24 hour  Intake 600 ml  Output 1675 ml  Net -1075 ml   Filed Weights   02/23/19 0630 02/24/19 0800 02/26/19 0530  Weight: 126 kg 128.6 kg 123.7 kg    Examination:  General exam: alert and not in distress.  Respiratory system: clear to auscultation, no wheezing or rhonchi.  Cardiovascular system: s1s2. Rrr, pedal edema improving.  gastrointestinal system: abd is soft, NT ND BS+ Central nervous system: Alert and oriented.  Extremities: improving leg edema. And persistent left upper extremity swelling.  Skin:  Bruising over the left arm is improving.  Psychiatry:  Mood appropriate.     Data Reviewed: I have personally reviewed following labs and imaging studies  CBC: Recent Labs  Lab 02/22/19 2120 02/26/19 0527  WBC 2.9* 3.4*  NEUTROABS 2.0 2.1  HGB 8.6* 8.7*  HCT 27.3* 27.2*  MCV 92.5 91.0  PLT 164 235   Basic Metabolic Panel: Recent Labs  Lab 02/23/19 0636 02/24/19 0542 02/24/19 1745 02/25/19 0900 02/26/19 0527  NA 135 134* 136 135 138  K 3.3* 2.8* 3.0* 3.0* 3.7  CL 99 97* 96* 95* 96*  CO2 23 26 28 28 27   GLUCOSE 107* 103* 120* 112* 113*  BUN 54* 48* 49* 46* 51*  CREATININE 1.94* 1.64* 1.78* 1.57* 1.75*  CALCIUM 9.3 9.1 9.2 9.3 9.3  MG 2.1 2.1  --  1.9 1.9   GFR: Estimated Creatinine Clearance: 51.1 mL/min (A) (by C-G formula based on SCr of 1.75 mg/dL (H)). Liver Function Tests: Recent Labs  Lab 02/22/19 2120  AST 26  ALT 17  ALKPHOS 47  BILITOT 0.9  PROT 6.8  ALBUMIN 3.3*   Recent Labs  Lab 02/22/19 2120  LIPASE 18   No results for input(s): AMMONIA in the last 168  hours. Coagulation Profile: No results for input(s): INR, PROTIME in the last 168 hours. Cardiac Enzymes: No results for input(s): CKTOTAL, CKMB, CKMBINDEX, TROPONINI in the last 168 hours. BNP (last 3 results) No results for input(s): PROBNP in the last 8760 hours. HbA1C: No results for input(s): HGBA1C in the last 72 hours. CBG: Recent Labs  Lab 02/25/19 1639 02/25/19 2113 02/26/19 0741 02/26/19 1134 02/26/19 1648  GLUCAP 130* 132* 102* 116* 124*   Lipid Profile: No results for input(s): CHOL, HDL, LDLCALC, TRIG, CHOLHDL, LDLDIRECT in the last 72 hours. Thyroid Function Tests: No results for input(s): TSH, T4TOTAL, FREET4, T3FREE, THYROIDAB in the last 72 hours. Anemia Panel: No results for input(s): VITAMINB12, FOLATE, FERRITIN, TIBC, IRON, RETICCTPCT in the last 72 hours. Sepsis Labs: No results for input(s): PROCALCITON, LATICACIDVEN in the  last 168 hours.  Recent Results (from the past 240 hour(s))  SARS Coronavirus 2 [facility] order, Performed in [facility] hospital lab) Nasopharyngeal Nasopharyngeal Swab     Status: None   Collection Time: 02/22/19 11:33 PM   Specimen: Nasopharyngeal Swab  Result Value Ref Range Status   SARS Coronavirus 2 NEGATIVE NEGATIVE Final    Comment: (NOTE) If result is NEGATIVE SARS-CoV-2 target nucleic acids are NOT DETECTED. The SARS-CoV-2 RNA is generally detectable in upper and lower  respiratory specimens during the acute phase of infection. The lowest  concentration of SARS-CoV-2 viral copies this assay can detect is 250  copies / mL. A negative result does not preclude SARS-CoV-2 infection  and should not be used as the sole basis for treatment or other  patient management decisions.  A negative result may occur with  improper specimen collection / handling, submission of specimen other  than nasopharyngeal swab, presence of viral mutation(s) within the  areas targeted by this assay, and inadequate number of viral copies  (<250  copies / mL). A negative result must be combined with clinical  observations, patient history, and epidemiological information. If result is POSITIVE SARS-CoV-2 target nucleic acids are DETECTED. The SARS-CoV-2 RNA is generally detectable in upper and lower  respiratory specimens dur ing the acute phase of infection.  Positive  results are indicative of active infection with SARS-CoV-2.  Clinical  correlation with patient history and other diagnostic information is  necessary to determine patient infection status.  Positive results do  not rule out bacterial infection or co-infection with other viruses. If result is PRESUMPTIVE POSTIVE SARS-CoV-2 nucleic acids MAY BE PRESENT.   A presumptive positive result was obtained on the submitted specimen  and confirmed on repeat testing.  While 2019 novel coronavirus  (SARS-CoV-2) nucleic acids may be present in the submitted sample  additional confirmatory testing may be necessary for epidemiological  and / or clinical management purposes  to differentiate between  SARS-CoV-2 and other Sarbecovirus currently known to infect humans.  If clinically indicated additional testing with an alternate test  methodology [phone]) is advised. The SARS-CoV-2 RNA is generally  detectable in upper and lower respiratory sp ecimens during the acute  phase of infection. The expected result is Negative. Fact Sheet for Patients:  StrictlyIdeas.no Fact Sheet for Healthcare Providers: BankingDealers.co.za This test is not yet approved or cleared by the [place] FDA and has been authorized for detection and/or diagnosis of SARS-CoV-2 by FDA under an Emergency Use Authorization (EUA).  This EUA will remain in effect (meaning this test can be used) for the duration of the COVID-19 declaration under Section 564(b)(1) of the Act, 21 U.S.C. section 360bbb-3(b)(1), unless the authorization is terminated or revoked  sooner. Performed at [facility], [place] [address] [place], [place] [address]          Radiology Studies: Dg Chest Port 1 View  Result Date: 02/26/2019 CLINICAL DATA:  Shortness of breath EXAM: PORTABLE CHEST 1 VIEW COMPARISON:  01/16/2019 chest CT FINDINGS: Decreased pleuroparenchymal opacity at the right base. There is right perihilar radiation fibrosis. Cardiomegaly. Unremarkable left-sided porta catheter. IMPRESSION: 1. Decreased pleural fluid on the right. 2. Right perihilar fibrosis and chronic volume loss. Electronically Signed   By: Monte Fantasia M.D.   On: 02/26/2019 04:27   Dg Shoulder Left  Result Date: 02/25/2019 CLINICAL DATA:  Pain status post fall EXAM: LEFT SHOULDER - 2+ VIEW COMPARISON:  January 30, 2019 FINDINGS: There is a partially  visualized left subclavian Port-A-Cath. Again noted is an impacted fracture of the left humeral head and neck. The fracture appears slightly more impacted on today's exam. There is mild surrounding soft tissue swelling. There is some evidence of mild interval callus formation. There is no glenohumeral dislocation. Osteopenia is noted. IMPRESSION: 1. Again noted is an impacted fracture of the proximal left humerus. There has been slight interval increase in impaction since the prior study. There is minimal surrounding callus formation. 2. No glenohumeral dislocation. Electronically Signed   By: Constance Holster M.D.   On: 02/25/2019 16:50   Vas [place] Upper Extremity Venous Duplex  Result Date: 02/26/2019 UPPER VENOUS STUDY  Indications: Swelling, and recent left shoulder fracture Limitations: Restricted mobility, body habitus and poor ultrasound/tissue interface. Comparison Study: No prior study. Performing Technologist: Maudry Mayhew MHA, RDMS, RVT, RDCS  Examination Guidelines: A complete evaluation includes B-mode imaging, spectral Doppler, color Doppler, and power Doppler as needed of all accessible portions of each vessel.  Bilateral testing is considered an integral part of a complete examination. Limited examinations for reoccurring indications may be performed as noted.  Right Findings: +----------+------------+---------+-----------+----------+--------------+ RIGHT     CompressiblePhasicitySpontaneousProperties   Summary     +----------+------------+---------+-----------+----------+--------------+ Subclavian                                          Not visualized +----------+------------+---------+-----------+----------+--------------+  Left Findings: +----------+------------+---------+-----------+----------+--------------+ LEFT      CompressiblePhasicitySpontaneousProperties   Summary     +----------+------------+---------+-----------+----------+--------------+ IJV           Full    pulsatile    Yes                             +----------+------------+---------+-----------+----------+--------------+ Subclavian            pulsatile    Yes                             +----------+------------+---------+-----------+----------+--------------+ Axillary      Full    pulsatile    Yes                             +----------+------------+---------+-----------+----------+--------------+ Brachial      Full                 Yes                             +----------+------------+---------+-----------+----------+--------------+ Radial        Full                                                 +----------+------------+---------+-----------+----------+--------------+ Ulnar                                               Not visualized +----------+------------+---------+-----------+----------+--------------+ Cephalic      Full                                                 +----------+------------+---------+-----------+----------+--------------+  Basilic                                             Not visualized  +----------+------------+---------+-----------+----------+--------------+  Summary:  Left: No evidence of deep vein thrombosis in the upper extremity. No evidence of superficial vein thrombosis in the upper extremity. This was a limited study.  *See table(s) above for measurements and observations.    Preliminary         Scheduled Meds: . apixaban  5 mg Oral BID  . carvedilol  6.25 mg Oral BID WC  . cloNIDine  0.1 mg Oral BID  . fenofibrate  160 mg Oral Daily  . furosemide  60 mg Intravenous Q8H  . hydrocortisone cream   Topical TID  . insulin aspart  0-9 Units Subcutaneous TID WC  . insulin detemir  10 Units Subcutaneous Daily  . nystatin   Topical TID  . omega-3 acid ethyl esters  1 g Oral BID  . polyethylene glycol  17 g Oral Daily  . potassium chloride  40 mEq Oral TID  . pravastatin  40 mg Oral Daily  . senna-docusate  2 tablet Oral BID   Continuous Infusions:   LOS: 4 days    Time spent: 32 minutes.    Hosie Poisson, MD [facility] Pager [phone]   If 7PM-7AM, please contact night-coverage [email or web address] Password [email or web address] 02/26/2019, 5:22 PM

## 2019-02-26 NOTE — Care Management Important Message (Signed)
Important Message  Patient Details IM Letter given to Rhea Pink SW to present to the Patient Name: Darrell [name] MRN: [id number] Date of Birth: 1945-12-01   Medicare Important Message Given:  Yes     Kerin Salen 02/26/2019, 12:47 PM

## 2019-02-26 NOTE — Telephone Encounter (Signed)
Received message from answering service stating that Dr. Emilia Beck had requested a return call from Dr. Lorin Mercy to discuss patient's right shoulder.  CB for Dr. Emilia Beck [phone]

## 2019-02-27 ENCOUNTER — Other Ambulatory Visit: Payer: Self-pay

## 2019-02-27 LAB — CBC WITH DIFFERENTIAL/PLATELET
Abs Immature Granulocytes: 0.01 10*3/uL (ref 0.00–0.07)
Basophils Absolute: 0 10*3/uL (ref 0.0–0.1)
Basophils Relative: 1 %
Eosinophils Absolute: 0.4 10*3/uL (ref 0.0–0.5)
Eosinophils Relative: 11 %
HCT: 26.2 % — ABNORMAL LOW (ref 39.0–52.0)
Hemoglobin: 8.4 g/dL — ABNORMAL LOW (ref 13.0–17.0)
Immature Granulocytes: 0 %
Lymphocytes Relative: 11 %
Lymphs Abs: 0.4 10*3/uL — ABNORMAL LOW (ref 0.7–4.0)
MCH: 29.3 pg (ref 26.0–34.0)
MCHC: 32.1 g/dL (ref 30.0–36.0)
MCV: 91.3 fL (ref 80.0–100.0)
Monocytes Absolute: 0.4 10*3/uL (ref 0.1–1.0)
Monocytes Relative: 12 %
Neutro Abs: 2.3 10*3/uL (ref 1.7–7.7)
Neutrophils Relative %: 65 %
Platelets: 206 10*3/uL (ref 150–400)
RBC: 2.87 MIL/uL — ABNORMAL LOW (ref 4.22–5.81)
RDW: 18.2 % — ABNORMAL HIGH (ref 11.5–15.5)
WBC: 3.6 10*3/uL — ABNORMAL LOW (ref 4.0–10.5)
nRBC: 0 % (ref 0.0–0.2)

## 2019-02-27 LAB — BASIC METABOLIC PANEL
Anion gap: 10 (ref 5–15)
BUN: 55 mg/dL — ABNORMAL HIGH (ref 8–23)
CO2: 28 mmol/L (ref 22–32)
Calcium: 9.1 mg/dL (ref 8.9–10.3)
Chloride: 97 mmol/L — ABNORMAL LOW (ref 98–111)
Creatinine, Ser: 2.01 mg/dL — ABNORMAL HIGH (ref 0.61–1.24)
GFR calc Af Amer: 38 mL/min — ABNORMAL LOW (ref >60)
GFR calc non Af Amer: 33 mL/min — ABNORMAL LOW (ref >60)
Glucose, Bld: 112 mg/dL — ABNORMAL HIGH (ref 70–99)
Potassium: 4.2 mmol/L (ref 3.5–5.1)
Sodium: 135 mmol/L (ref 135–145)

## 2019-02-27 LAB — GLUCOSE, CAPILLARY
Glucose-Capillary: 103 mg/dL — ABNORMAL HIGH (ref 70–99)
Glucose-Capillary: 118 mg/dL — ABNORMAL HIGH (ref 70–99)
Glucose-Capillary: 126 mg/dL — ABNORMAL HIGH (ref 70–99)
Glucose-Capillary: 141 mg/dL — ABNORMAL HIGH (ref 70–99)

## 2019-02-27 MED ORDER — CLONIDINE HCL 0.1 MG PO TABS
0.1000 mg | ORAL_TABLET | Freq: Every day | ORAL | Status: DC
  Filled 2019-02-27: qty 1

## 2019-02-27 NOTE — Progress Notes (Signed)
PROGRESS NOTE    Darrell [name]  GDJ:[id number] DOB: Nov 16, 1945 DOA: 02/22/2019 PCP: [name], MD  Brief Narrative:  73 year old with stage 3 squamous cell lung ca, recurrent pleural effusion s/o right pleurex catheter, DM, stage 3 CKD, hypertension, presents to ED with sob and worsening leg edema.  He was admitted for onset atrial fibrillation with RVR and acute on chronic diastolic heart failure.  Cardiology consulted and recommendations to continue anticoagulation with  Eliquis and to continue IV Lasix. Meanwhile patient reports worsening left arm swelling and tenderness,   X-rays of the left shoulder and arm were ordered showed increase in the impaction of the fracture.  Venous duplex of the left upper extremity ordered to rule out DVT, ordered but couldn't be done due to pain. Discussed with Dr Lorin Mercy, recommended compliance to shoulder sling and not use the arm and continue with pain meds.  Patient creatinine worsened with IV diuresis from 1.7-2 hence IV Lasix was stopped.  Recheck creatinine in am if improving, can be discharged home on oral lasix.  Discussed with the patient and his wife.     Assessment & Plan:   Principal Problem:   Acute CHF (congestive heart failure) (HCC) Active Problems:   DM2 (diabetes mellitus, type 2) (HCC)   HYPERTENSION, BENIGN   Stage III squamous cell cancer   CKD (chronic kidney disease) stage 3, GFR 30-59 ml/min (HCC)   Thoracic aortic aneurysm without rupture (HCC)   COPD (chronic obstructive pulmonary disease) (HCC)   Acute on chronic diastolic heart failure Acute respiratory failure with hypoxia Improving with IV Lasix and diuresis. Patient has diuresed about 5.3 L since admission. Creatinine worsened from 1.7 to 2 with IV lasix 60 mg TID, hence IV lasix stopped . Recheck creatinine in am and if improving, restart oral lasix 40 mg BID and discharge patient home with outpatient follow up with cardiology.  Continue to monitor electrolytes  and renal parameters.   Continue with strict intake and output, daily weights and fluid restriction to 1200 mL/day.  Echocardiogram on 02/23/2019 showed The left ventricle has normal systolic function with an ejection fraction of 60-65%. The cavity size was normal. Left ventricular diastolic function could not be evaluated secondary to atrial fibrillation. Very poor acoustical windows limited evaluation for regional wall motion which could not be assessed.   Atrial fibrillation with RVR Resolved and patient is  in sinus rhythm. Lovenox discontinued and  changed to Eliquis. Rate controlled well with Coreg.   Hypertension Very well controlled. Cardiology decreased the dose of clonidine to 1 mg daily.  Resume coreg.    Type 2 diabetes mellitus Well controlled . A1c is 5.8% CBG (last 3)  Recent Labs    02/26/19 1648 02/26/19 2053 02/27/19 0718  GLUCAP 124* 140* 103*   Continue with Levemir 10 units daily and sliding scale insulin.  No changes in the dosage of Levemir.    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 Recommend outpatient with follow-up with oncology.   Hypokalemia  Replaced and repeat level wnl.    COPD No wheezing heard.  CXR shows improvement in aeration.    Acute on Stage III CKD Creatinine bumped from 1.7 to 2. Possibly from IV diuresis. Stopped IV lasix and recheck creatinine in am, if improving, cardiology recommended 40 mg BID of lasix.     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 Hemoglobin stable around 8  transfuse to keep hemoglobin greater than 7.   Left proximal humerus fracture Worsening pain and swelling two day ago, venous duplex of the left upper extremity did not show any DVT and X-rays of the shoulder shows increase in impaction of the fracture Discussed with the patient and the wife to keep the sling on even at rest.   Discussed with Dr Lorin Mercy recommended to continue with sling, not to use the arm and pain control.  Outpatient follow up with Dr Lorin Mercy in the office next week.   Stable fusiform ascending aortic aneurysm measuring 4.4 cm. Recommend annual imaging followup by CTA or MRA.   DVT prophylaxis: eliquis.  Code Status: full code.  Family Communication:none at bedside today. Discussed with wife over the phone.  Disposition Plan: pending clinical improvement. And possible dc home in am if creatinine is improving.  Consultants:   Cardiology.   Dr Lorin Mercy over the phone.   Oncology.   Procedures:  None.   Antimicrobials:none.    Subjective: Left arm pain and swelling better today. No chest pain or sob. No nausea or vomiting.   Objective: Vitals:   02/26/19 2052 02/27/19 0459 02/27/19 0512 02/27/19 0756  BP: 108/64 121/69  128/71  Pulse: 62 71  63  Resp: 18 20    Temp: 98.8 F (37.1 C) 98 F (36.7 C)    TempSrc: Oral Oral    SpO2: 95% 96%    Weight:   123.3 kg   Height:        Intake/Output Summary (Last 24 hours) at 02/27/2019 1050 Last data filed at 02/27/2019 1029 Gross per 24 hour  Intake 1310 ml  Output 1550 ml  Net -240 ml   Filed Weights   02/24/19 0800 02/26/19 0530 02/27/19 0512  Weight: 128.6 kg 123.7 kg 123.3 kg    Examination:  General exam: alert and not in distress. Comfortable.  Respiratory system: diminished at bases, no wheezing heard. pleurex in place.  Cardiovascular system: S1S2 heard, RRR, improving leg edema, no JVD.  gastrointestinal system: Abdomen is soft, obese, non tender non distended bowel souds good.  Central nervous system: Alert and oriented to place and person.  Extremities:  3+ leg edema, but improving, left upper extremity edema and tenderness present, in sling.  Skin: Bruising of the left upper extremity improving.  Psychiatry:  Mood is appropriate.     Data Reviewed: I have personally reviewed following labs and imaging  studies  CBC: Recent Labs  Lab 02/22/19 2120 02/26/19 0527 02/27/19 0406  WBC 2.9* 3.4* 3.6*  NEUTROABS 2.0 2.1 2.3  HGB 8.6* 8.7* 8.4*  HCT 27.3* 27.2* 26.2*  MCV 92.5 91.0 91.3  PLT 164 194 371   Basic Metabolic Panel: Recent Labs  Lab 02/23/19 0636 02/24/19 0542 02/24/19 1745 02/25/19 0900 02/26/19 0527 02/27/19 0406  NA 135 134* 136 135 138 135  K 3.3* 2.8* 3.0* 3.0* 3.7 4.2  CL 99 97* 96* 95* 96* 97*  CO2 23 26 28 28 27 28   GLUCOSE 107* 103* 120* 112* 113* 112*  BUN 54* 48* 49* 46* 51* 55*  CREATININE 1.94* 1.64* 1.78* 1.57* 1.75* 2.01*  CALCIUM 9.3 9.1 9.2 9.3 9.3 9.1  MG 2.1 2.1  --  1.9 1.9  --    GFR: Estimated Creatinine Clearance: 44.4 mL/min (A) (by C-G formula based on SCr of 2.01 mg/dL (H)). Liver Function Tests: Recent Labs  Lab 02/22/19 2120  AST 26  ALT 17  ALKPHOS 47  BILITOT 0.9  PROT 6.8  ALBUMIN 3.3*   Recent Labs  Lab 02/22/19 2120  LIPASE 18   No results for input(s): AMMONIA in the last 168 hours. Coagulation Profile: No results for input(s): INR, PROTIME in the last 168 hours. Cardiac Enzymes: No results for input(s): CKTOTAL, CKMB, CKMBINDEX, TROPONINI in the last 168 hours. BNP (last 3 results) No results for input(s): PROBNP in the last 8760 hours. HbA1C: No results for input(s): HGBA1C in the last 72 hours. CBG: Recent Labs  Lab 02/26/19 0741 02/26/19 1134 02/26/19 1648 02/26/19 2053 02/27/19 0718  GLUCAP 102* 116* 124* 140* 103*   Lipid Profile: No results for input(s): CHOL, HDL, LDLCALC, TRIG, CHOLHDL, LDLDIRECT in the last 72 hours. Thyroid Function Tests: No results for input(s): TSH, T4TOTAL, FREET4, T3FREE, THYROIDAB in the last 72 hours. Anemia Panel: No results for input(s): VITAMINB12, FOLATE, FERRITIN, TIBC, IRON, RETICCTPCT in the last 72 hours. Sepsis Labs: No results for input(s): PROCALCITON, LATICACIDVEN in the last 168 hours.  Recent Results (from the past 240 hour(s))  SARS Coronavirus 2  [facility] order, Performed in [facility] hospital lab) Nasopharyngeal Nasopharyngeal Swab     Status: None   Collection Time: 02/22/19 11:33 PM   Specimen: Nasopharyngeal Swab  Result Value Ref Range Status   SARS Coronavirus 2 NEGATIVE NEGATIVE Final    Comment: (NOTE) If result is NEGATIVE SARS-CoV-2 target nucleic acids are NOT DETECTED. The SARS-CoV-2 RNA is generally detectable in upper and lower  respiratory specimens during the acute phase of infection. The lowest  concentration of SARS-CoV-2 viral copies this assay can detect is 250  copies / mL. A negative result does not preclude SARS-CoV-2 infection  and should not be used as the sole basis for treatment or other  patient management decisions.  A negative result may occur with  improper specimen collection / handling, submission of specimen other  than nasopharyngeal swab, presence of viral mutation(s) within the  areas targeted by this assay, and inadequate number of viral copies  (<250 copies / mL). A negative result must be combined with clinical  observations, patient history, and epidemiological information. If result is POSITIVE SARS-CoV-2 target nucleic acids are DETECTED. The SARS-CoV-2 RNA is generally detectable in upper and lower  respiratory specimens dur ing the acute phase of infection.  Positive  results are indicative of active infection with SARS-CoV-2.  Clinical  correlation with patient history and other diagnostic information is  necessary to determine patient infection status.  Positive results do  not rule out bacterial infection or co-infection with other viruses. If result is PRESUMPTIVE POSTIVE SARS-CoV-2 nucleic acids MAY BE PRESENT.   A presumptive positive result was obtained on the submitted specimen  and confirmed on repeat testing.  While 2019 novel coronavirus  (SARS-CoV-2) nucleic acids may be present in the submitted sample  additional confirmatory testing may be necessary for  epidemiological  and / or clinical management purposes  to differentiate between  SARS-CoV-2 and other Sarbecovirus currently known to infect humans.  If clinically indicated additional testing with an alternate test  methodology [id number]) is advised. The SARS-CoV-2 RNA is generally  detectable in upper and lower respiratory sp ecimens during the acute  phase of infection. The expected result is Negative. Fact Sheet for Patients:  StrictlyIdeas.no Fact Sheet for Healthcare Providers: BankingDealers.co.za This test is not yet approved or cleared by the [place] FDA and has been authorized for detection and/or diagnosis of SARS-CoV-2 by FDA under an Emergency Use Authorization (EUA).  This  EUA will remain in effect (meaning this test can be used) for the duration of the COVID-19 declaration under Section 564(b)(1) of the Act, 21 U.S.C. section 360bbb-3(b)(1), unless the authorization is terminated or revoked sooner. Performed at [facility], [place] [address] [place], [place] [address]          Radiology Studies: Dg Chest Port 1 View  Result Date: 02/26/2019 CLINICAL DATA:  Shortness of breath EXAM: PORTABLE CHEST 1 VIEW COMPARISON:  01/16/2019 chest CT FINDINGS: Decreased pleuroparenchymal opacity at the right base. There is right perihilar radiation fibrosis. Cardiomegaly. Unremarkable left-sided porta catheter. IMPRESSION: 1. Decreased pleural fluid on the right. 2. Right perihilar fibrosis and chronic volume loss. Electronically Signed   By: Monte Fantasia M.D.   On: 02/26/2019 04:27   Dg Shoulder Left  Result Date: 02/25/2019 CLINICAL DATA:  Pain status post fall EXAM: LEFT SHOULDER - 2+ VIEW COMPARISON:  January 30, 2019 FINDINGS: There is a partially visualized left subclavian Port-A-Cath. Again noted is an impacted fracture of the left humeral head and neck. The fracture appears slightly more impacted on  today's exam. There is mild surrounding soft tissue swelling. There is some evidence of mild interval callus formation. There is no glenohumeral dislocation. Osteopenia is noted. IMPRESSION: 1. Again noted is an impacted fracture of the proximal left humerus. There has been slight interval increase in impaction since the prior study. There is minimal surrounding callus formation. 2. No glenohumeral dislocation. Electronically Signed   By: Constance Holster M.D.   On: 02/25/2019 16:50   Vas [place] Upper Extremity Venous Duplex  Result Date: 02/26/2019 UPPER VENOUS STUDY  Indications: Swelling, and recent left shoulder fracture Limitations: Restricted mobility, body habitus and poor ultrasound/tissue interface. Comparison Study: No prior study. Performing Technologist: Maudry Mayhew MHA, RDMS, RVT, RDCS  Examination Guidelines: A complete evaluation includes B-mode imaging, spectral Doppler, color Doppler, and power Doppler as needed of all accessible portions of each vessel. Bilateral testing is considered an integral part of a complete examination. Limited examinations for reoccurring indications may be performed as noted.  Right Findings: +----------+------------+---------+-----------+----------+--------------+ RIGHT     CompressiblePhasicitySpontaneousProperties   Summary     +----------+------------+---------+-----------+----------+--------------+ Subclavian                                          Not visualized +----------+------------+---------+-----------+----------+--------------+  Left Findings: +----------+------------+---------+-----------+----------+--------------+ LEFT      CompressiblePhasicitySpontaneousProperties   Summary     +----------+------------+---------+-----------+----------+--------------+ IJV           Full    pulsatile    Yes                             +----------+------------+---------+-----------+----------+--------------+ Subclavian             pulsatile    Yes                             +----------+------------+---------+-----------+----------+--------------+ Axillary      Full    pulsatile    Yes                             +----------+------------+---------+-----------+----------+--------------+ Brachial      Full  Yes                             +----------+------------+---------+-----------+----------+--------------+ Radial        Full                                                 +----------+------------+---------+-----------+----------+--------------+ Ulnar                                               Not visualized +----------+------------+---------+-----------+----------+--------------+ Cephalic      Full                                                 +----------+------------+---------+-----------+----------+--------------+ Basilic                                             Not visualized +----------+------------+---------+-----------+----------+--------------+  Summary:  Left: No evidence of deep vein thrombosis in the upper extremity. No evidence of superficial vein thrombosis in the upper extremity. This was a limited study.  *See table(s) above for measurements and observations.  Diagnosing physician: Servando Snare MD Electronically signed by Servando Snare MD on 02/26/2019 at 9:56:17 PM.    Final         Scheduled Meds: . apixaban  5 mg Oral BID  . carvedilol  6.25 mg Oral BID WC  . cloNIDine  0.1 mg Oral BID  . fenofibrate  160 mg Oral Daily  . hydrocortisone cream   Topical TID  . insulin aspart  0-9 Units Subcutaneous TID WC  . insulin detemir  10 Units Subcutaneous Daily  . nystatin   Topical TID  . omega-3 acid ethyl esters  1 g Oral BID  . polyethylene glycol  17 g Oral Daily  . pravastatin  40 mg Oral Daily  . senna-docusate  2 tablet Oral BID   Continuous Infusions:   LOS: 5 days    Time spent: 34  minutes.    Hosie Poisson, MD [facility] Pager [phone]   If 7PM-7AM, please contact night-coverage [email or web address] Password [email or web address] 02/27/2019, 10:50 AM

## 2019-02-27 NOTE — Telephone Encounter (Signed)
noted 

## 2019-02-27 NOTE — TOC Progression Note (Signed)
Transition of Care [facility]) - Progression Note    Patient Details  Name: Darrell [name] MRN: [id number] Date of Birth: 1945-09-19  Transition of [facility]) CM/SW [place], LCSW Phone Number: 02/27/2019, 3:58 PM  Clinical Narrative:   CSW spoke with patient spouse as patient slept in the room. Spouse stated that she is agreeable to have patient discharge home with home health. Spouse stated they had [facility] in the past and would prefer to stick with that agency.  CSW reached out to [place] from [facility] and she stated she will look into patients needs and reach back out to CSW to let her know if they are able to take patient for home health    Expected Discharge Plan: [place] Barriers to Discharge: Continued Medical Work up  Expected Discharge Plan and Services Expected Discharge Plan: [place] Determinants of Health (SDOH) Interventions    Readmission Risk Interventions Readmission Risk Prevention Plan 02/23/2019  Transportation Screening Complete  PCP or Specialist Appt within 3-5 Days Not Complete  Not Complete comments Not ready for dc  HRI or [place] Not Complete  HRI or Home Care Consult comments NA  Social Work Consult for [place] Planning/Counseling Not Complete  SW consult not completed comments NA  Palliative Care Screening Not Applicable  Medication Review [employer] photographer) Complete  Some recent data might be hidden

## 2019-02-27 NOTE — Progress Notes (Addendum)
Progress Note  Patient Name: Darrell [name] Date of Encounter: 02/27/2019  Primary Cardiologist: Candee Furbish, MD   Subjective   No acute overnight events.   Inpatient Medications    Scheduled Meds: . apixaban  5 mg Oral BID  . carvedilol  6.25 mg Oral BID WC  . cloNIDine  0.1 mg Oral BID  . fenofibrate  160 mg Oral Daily  . hydrocortisone cream   Topical TID  . insulin aspart  0-9 Units Subcutaneous TID WC  . insulin detemir  10 Units Subcutaneous Daily  . nystatin   Topical TID  . omega-3 acid ethyl esters  1 g Oral BID  . polyethylene glycol  17 g Oral Daily  . pravastatin  40 mg Oral Daily  . senna-docusate  2 tablet Oral BID   Continuous Infusions:  PRN Meds: acetaminophen **OR** acetaminophen, albuterol, ondansetron **OR** ondansetron (ZOFRAN) IV, oxyCODONE-acetaminophen, sodium chloride flush, traMADol   Vital Signs    Vitals:   02/26/19 2052 02/27/19 0459 02/27/19 0512 02/27/19 0756  BP: 108/64 121/69  128/71  Pulse: 62 71  63  Resp: 18 20    Temp: 98.8 F (37.1 C) 98 F (36.7 C)    TempSrc: Oral Oral    SpO2: 95% 96%    Weight:   123.3 kg   Height:        Intake/Output Summary (Last 24 hours) at 02/27/2019 1130 Last data filed at 02/27/2019 1029 Gross per 24 hour  Intake 1310 ml  Output 1550 ml  Net -240 ml   Last 3 Weights 02/27/2019 02/26/2019 02/24/2019  Weight (lbs) 271 lb 14.4 oz 272 lb 12.8 oz 283 lb 9.6 oz  Weight (kg) 123.333 kg 123.741 kg 128.64 kg      Telemetry    SR with frequent PACs - Personally Reviewed  ECG    Normal sinus rhythm with frequent PAC and non-specific T wave changes.  - Personally Reviewed  Physical Exam   GEN: No acute distress.   Neck: No JVD Cardiac: RRR, no murmurs, rubs, or gallops.  Respiratory: Clear to auscultation bilaterally. GI: Soft, nontender, non-distended  MS: B/L LE edema up to knees; No deformity. Neuro:  Nonfocal  Psych: Normal affect   Labs    High Sensitivity Troponin:   Recent  Labs  Lab 02/22/19 2120 02/22/19 2151  TROPONINIHS 6 6      Cardiac EnzymesNo results for input(s): TROPONINI in the last 168 hours. No results for input(s): TROPIPOC in the last 168 hours.   Chemistry Recent Labs  Lab 02/22/19 2120  02/25/19 0900 02/26/19 0527 02/27/19 0406  NA 134*   < > 135 138 135  K 3.5   < > 3.0* 3.7 4.2  CL 101   < > 95* 96* 97*  CO2 25   < > 28 27 28   GLUCOSE 102*   < > 112* 113* 112*  BUN 50*   < > 46* 51* 55*  CREATININE 1.94*   < > 1.57* 1.75* 2.01*  CALCIUM 9.2   < > 9.3 9.3 9.1  PROT 6.8  --   --   --   --   ALBUMIN 3.3*  --   --   --   --   AST 26  --   --   --   --   ALT 17  --   --   --   --   ALKPHOS 47  --   --   --   --  BILITOT 0.9  --   --   --   --   GFRNONAA 34*   < > 44* 39* 33*  GFRAA 39*   < > 51* 45* 38*  ANIONGAP 8   < > 12 15 10    < > = values in this interval not displayed.     Hematology Recent Labs  Lab 02/22/19 2120 02/26/19 0527 02/27/19 0406  WBC 2.9* 3.4* 3.6*  RBC 2.95* 2.99* 2.87*  HGB 8.6* 8.7* 8.4*  HCT 27.3* 27.2* 26.2*  MCV 92.5 91.0 91.3  MCH 29.2 29.1 29.3  MCHC 31.5 32.0 32.1  RDW 18.1* 18.1* 18.2*  PLT 164 194 206    BNP Recent Labs  Lab 02/22/19 2120  BNP 501.4*     DDimer No results for input(s): DDIMER in the last 168 hours.   Radiology    Dg Chest Port 1 View  Result Date: 02/26/2019 CLINICAL DATA:  Shortness of breath EXAM: PORTABLE CHEST 1 VIEW COMPARISON:  01/16/2019 chest CT FINDINGS: Decreased pleuroparenchymal opacity at the right base. There is right perihilar radiation fibrosis. Cardiomegaly. Unremarkable left-sided porta catheter. IMPRESSION: 1. Decreased pleural fluid on the right. 2. Right perihilar fibrosis and chronic volume loss. Electronically Signed   By: Monte Fantasia M.D.   On: 02/26/2019 04:27   Dg Shoulder Left  Result Date: 02/25/2019 CLINICAL DATA:  Pain status post fall EXAM: LEFT SHOULDER - 2+ VIEW COMPARISON:  January 30, 2019 FINDINGS: There is a partially  visualized left subclavian Port-A-Cath. Again noted is an impacted fracture of the left humeral head and neck. The fracture appears slightly more impacted on today's exam. There is mild surrounding soft tissue swelling. There is some evidence of mild interval callus formation. There is no glenohumeral dislocation. Osteopenia is noted. IMPRESSION: 1. Again noted is an impacted fracture of the proximal left humerus. There has been slight interval increase in impaction since the prior study. There is minimal surrounding callus formation. 2. No glenohumeral dislocation. Electronically Signed   By: Constance Holster M.D.   On: 02/25/2019 16:50   Vas [place] Upper Extremity Venous Duplex  Result Date: 02/26/2019 UPPER VENOUS STUDY  Indications: Swelling, and recent left shoulder fracture Limitations: Restricted mobility, body habitus and poor ultrasound/tissue interface. Comparison Study: No prior study. Performing Technologist: Maudry Mayhew MHA, RDMS, RVT, RDCS  Examination Guidelines: A complete evaluation includes B-mode imaging, spectral Doppler, color Doppler, and power Doppler as needed of all accessible portions of each vessel. Bilateral testing is considered an integral part of a complete examination. Limited examinations for reoccurring indications may be performed as noted.  Right Findings: +----------+------------+---------+-----------+----------+--------------+ RIGHT     CompressiblePhasicitySpontaneousProperties   Summary     +----------+------------+---------+-----------+----------+--------------+ Subclavian                                          Not visualized +----------+------------+---------+-----------+----------+--------------+  Left Findings: +----------+------------+---------+-----------+----------+--------------+ LEFT      CompressiblePhasicitySpontaneousProperties   Summary     +----------+------------+---------+-----------+----------+--------------+ IJV            Full    pulsatile    Yes                             +----------+------------+---------+-----------+----------+--------------+ Subclavian            pulsatile    Yes                             +----------+------------+---------+-----------+----------+--------------+  Axillary      Full    pulsatile    Yes                             +----------+------------+---------+-----------+----------+--------------+ Brachial      Full                 Yes                             +----------+------------+---------+-----------+----------+--------------+ Radial        Full                                                 +----------+------------+---------+-----------+----------+--------------+ Ulnar                                               Not visualized +----------+------------+---------+-----------+----------+--------------+ Cephalic      Full                                                 +----------+------------+---------+-----------+----------+--------------+ Basilic                                             Not visualized +----------+------------+---------+-----------+----------+--------------+  Summary:  Left: No evidence of deep vein thrombosis in the upper extremity. No evidence of superficial vein thrombosis in the upper extremity. This was a limited study.  *See table(s) above for measurements and observations.  Diagnosing physician: Servando Snare MD Electronically signed by Servando Snare MD on 02/26/2019 at 9:56:17 PM.    Final     Cardiac Studies   Echocardiogram 02/23/2019:  1. The left ventricle has normal systolic function with an ejection fraction of 60-65%. The cavity size was normal. Left ventricular diastolic function could not be evaluated secondary to atrial fibrillation.  2. Very poor acoustical windows limited evaluation for regional wall motion which could not be assessed.  3. The right ventricle has normal systolic function. The cavity  was normal. There is no increase in right ventricular wall thickness.  4. Left atrial size was mildly dilated.  5. The mitral valve is grossly normal.  6. The aortic valve is grossly normal.  7. The aorta is normal unless otherwise noted.  8. The inferior vena cava was dilated in size with <50% respiratory variability.   Patient Profile   Mr. Knoth is a 73 y.o. male with a history of stage III squamous cell carcinoma followed by Dr. Earlie Server on immunotherapy, recurrent pleural effusion with right sided Pleurx catheter placement, hypertension, type 2 diabetes mellitus, CKD stage III, thoracic aortic aneurysmwho is being seen today for the evaluation CHF and PACsat the request of Dr. Karleen Hampshire.  Assessment & Plan    Acute Diastolic CHF - Patient admitted with volume overload and elevated BNP at 501.  - Echo showed LVEF of 60-65%. - IV Lasix increased to 60mg  every 8 hours yesterday. Documented urinary output  of 1.8 L in the past 24 hours and net negative 5.2 L since admission. Weight 272 lbs today, down from 277 lbs on admission. Creatinine up from 1.75 to 2.01 today after increase of Lasix.  Frequent PACs - Consult was initially placed for new onset atrial fibrillation. However, patient was seen by Dr. Marlou Porch on 02/23/2019 and upon further review of EKGs it was felt like rhythm was normal sinus rhythm with frequent PACs not atrial fibrillation. There has been a lot of noise on telemetry making it very difficult to tell underlying rhythm. However, repeat EKG was ordered yesterday and again shows normal sinus rhythm with PACs. Eliquis was started on 02/24/2019 for possible atrial fibrillation. Will discontinue given no evidence of atrial fibrillation on EKGs and no evidence of DVT on upper extremity ultrasound yesterday.  Hypertension - BP currently well controlled. - Continue Coreg and Clonidine.  Left Proximal Humerus Fracture - Secondary to mechanical fall 3 weeks ago. Patient has significant  left upper extremity edema. Doppler ordered yesterday and showed no evidence of DVT. - Management per primary team and Ortho.  Acute on Chronic Kidney Disease Stage III - Creatinine 2.01 today, up from 1.75 yesterday after increase in Lasix. Baseline around 1.5 to 1.6.  Diabetes Mellitus - Management per primary team.  Hypokalemia - Potassium dropped to 2.8 on 02/24/2019. Repleted and improved. Potassium 4.2 today. - Continue to monitor with diuresis.  For questions or updates, please contact [facility] Please consult [email or web address] for contact info under     Signed, Darreld Mclean, PA-C  02/27/2019, 11:30 AM    The patient was seen, examined and discussed with Brittainy M. Rosita Fire, PA-C and I agree with the above.   The patient remains significantly fluid overloaded, we increased lasix to 60 mg IV Q8H yesterday with minimal diuresis and increased Crea 1.75->2.01. I will , his swelling is most probably multifactorial - immobility, low albumin, diastolic CHF. He can be discharged, I would discharge him on Lasix 40 mg PO BID, we will arrange for a follow up. Weight down to 271 lbs but baseline 255 lbs. Given low BP I would decrease clonidine to 0.1 mg po daily. I have reviewed his telemetry closely, because there is a lot of of noise it is difficult to distinguish between sinus rhythm with frequent multifocal PACs versus true atrial fibrillation, however 12 lead ECG shows SR with frequent PACs, we will discontinue Eliquis as there is no evidence for a-fib and he has significant anemia.   [facility] will sign off.   Medication Recommendations:  As above Other recommendations (labs, testing, etc):  No further testing Follow up as an outpatient:  We will arrange  Ena Dawley, MD 02/26/2019

## 2019-02-27 NOTE — Progress Notes (Signed)
ANTICOAGULATION CONSULT NOTE -   Pharmacy Consult for apixaban Indication: atrial fibrillation  Allergies  Allergen Reactions  . Lisinopril Cough         Patient Measurements: Height: 5\' 9"  (175.3 cm) Weight: 271 lb 14.4 oz (123.3 kg) IBW/kg (Calculated) : 70.7 Heparin Dosing Weight: 99 kg  Vital Signs: Temp: 98 F (36.7 C) (08/18 0459) Temp Source: Oral (08/18 0459) BP: 128/71 (08/18 0756) Pulse Rate: 63 (08/18 0756)  Labs: Recent Labs    02/25/19 0900 02/26/19 0527 02/27/19 0406  HGB  --  8.7* 8.4*  HCT  --  27.2* 26.2*  PLT  --  194 206  CREATININE 1.57* 1.75* 2.01*    Estimated Creatinine Clearance: 44.4 mL/min (A) (by C-G formula based on SCr of 2.01 mg/dL (H)).   Medical History: Past Medical History:  Diagnosis Date  . Aortic atherosclerosis ([place]) 07/18/2018  . Cancer ([place])   . Cellulitis of right lower extremity   . Chronic kidney disease   . Diabetes mellitus without complication ([place])   . Dyslipidemia   . Hypertension   . Thoracic aortic aneurysm without rupture ([place]) 07/18/2018    Assessment: Pharmacy consulted to dose apixaban for new onset atrial fibrillation. Pt was not on anticoagulation PTA. SCr 2.0, age 73, wt 123.3 kg. BMI 40.55  Plan:  Continue apixaban 5 mg po bid Education complete Pharmacy will sign off and follow peripherally  Dolly Rias RPh 02/27/2019, 10:37 AM Pager [phone]

## 2019-02-27 NOTE — Progress Notes (Signed)
Physical Therapy Treatment Patient Details Name: Darrell [name] MRN: [id number] DOB: 1945/12/03 Today's Date: 02/27/2019    History of Present Illness 74 yo male admitted with CHF, L shoulder pain (sustained shoulder fx after fall on 7/21). Hx of lung ca, DM, CKD, recurrent pleural effusion-R pleurx catheter, AA, CHF, LE edema, fall, A fib    PT Comments    Pt ambulated 160' with IV pole, distance limited by 2/4 dyspnea, SaO2 98% on room air, HR 80s. Pitting edema noted L hand. Instructed pt/spouse in correct positioning of sling and in L elbow/wrist/hand AROM exercises. HHPT recommended to follow LUE edema and weakness.    Follow Up Recommendations  Supervision/Assistance - 24 hour;Home health PT     Equipment Recommendations  None recommended by PT    Recommendations for Other Services       Precautions / Restrictions Precautions Precautions: Fall Precaution Comments: fell in late July, pt reports no other falls in past 1 year, adjusted sling to have 90* flexion at L elbow Required Braces or Orthoses: Sling(L UE) Restrictions Weight Bearing Restrictions: Yes LUE Weight Bearing: Non weight bearing Other Position/Activity Restrictions: assume NWB LUE    Mobility  Bed Mobility               General bed mobility comments: oob in recliner  Transfers Overall transfer level: Needs assistance Equipment used: None Transfers: Sit to/from Stand Sit to Stand: Min guard         General transfer comment: VCs for R hand placement, to scoot forward and lean forward  Ambulation/Gait Ambulation/Gait assistance: Min guard Gait Distance (Feet): 160 Feet Assistive device: IV Pole Gait Pattern/deviations: Step-through pattern;Decreased stride length     General Gait Details: fair gait speed. 1 standing rest break after ~80 feet 2* dyspnea 2/4. SaO2 98%, HR 80s, no LOB   Stairs             Wheelchair Mobility    Modified Rankin (Stroke Patients Only)        Balance Overall balance assessment: History of Falls;Needs assistance           Standing balance-Leahy Scale: Fair                              Cognition Arousal/Alertness: Awake/alert Behavior During Therapy: WFL for tasks assessed/performed Overall Cognitive Status: Within Functional Limits for tasks assessed                                        Exercises General Exercises - Upper Extremity Elbow Flexion: AROM;Left;5 reps;Seated Wrist Flexion: AROM;Left;5 reps;Seated Digit Composite Flexion: AROM;Left;10 reps;Seated;Squeeze ball Hand Exercises Forearm Supination: AROM;Left;10 reps;Seated Forearm Pronation: AROM;Left;10 reps;Seated    General Comments        Pertinent Vitals/Pain Pain Score: 2  Pain Location: L shoulder Pain Descriptors / Indicators: Sore Pain Intervention(s): Limited activity within patient's tolerance;Monitored during session;Premedicated before session;Ice applied    Home Living                      Prior Function            PT Goals (current goals can now be found in the care plan section) Acute Rehab PT Goals Patient Stated Goal: home soon PT Goal Formulation: With patient Time For Goal Achievement: 03/11/19 Potential to Achieve Goals:  Good Progress towards PT goals: Progressing toward goals    Frequency    Min 3X/week      PT Plan Discharge plan needs to be updated(recommending HHPT to f/u with LUE weakness/edema)    Co-evaluation              AM-PAC PT "6 Clicks" Mobility   Outcome Measure  Help needed turning from your back to your side while in a flat bed without using bedrails?: A Little Help needed moving from lying on your back to sitting on the side of a flat bed without using bedrails?: A Little Help needed moving to and from a bed to a chair (including a wheelchair)?: A Little Help needed standing up from a chair using your arms (e.g., wheelchair or bedside chair)?: A  Little Help needed to walk in hospital room?: A Little Help needed climbing 3-5 steps with a railing? : A Little 6 Click Score: 18    End of Session Equipment Utilized During Treatment: Gait belt Activity Tolerance: Patient tolerated treatment well Patient left: in chair;with call bell/phone within reach;with family/visitor present Nurse Communication: Mobility status PT Visit Diagnosis: Unsteadiness on feet (R26.81);History of falling (Z91.81)     Time: 1345-1415 PT Time Calculation (min) (ACUTE ONLY): 30 min  Charges:  $Gait Training: 8-22 mins $Therapeutic Exercise: 8-22 mins                     Blondell Reveal Kistler PT 02/27/2019  Acute Rehabilitation Services Pager [phone] Office [phone]

## 2019-02-28 ENCOUNTER — Ambulatory Visit: Payer: Medicare Other | Admitting: Orthopaedic Surgery

## 2019-02-28 ENCOUNTER — Other Ambulatory Visit: Payer: Self-pay

## 2019-02-28 DIAGNOSIS — M7989 Other specified soft tissue disorders: Secondary | ICD-10-CM

## 2019-02-28 DIAGNOSIS — M79601 Pain in right arm: Secondary | ICD-10-CM

## 2019-02-28 LAB — CBC WITH DIFFERENTIAL/PLATELET
Abs Immature Granulocytes: 0.01 10*3/uL (ref 0.00–0.07)
Basophils Absolute: 0.1 10*3/uL (ref 0.0–0.1)
Basophils Relative: 2 %
Eosinophils Absolute: 0.5 10*3/uL (ref 0.0–0.5)
Eosinophils Relative: 13 %
HCT: 25.5 % — ABNORMAL LOW (ref 39.0–52.0)
Hemoglobin: 8.1 g/dL — ABNORMAL LOW (ref 13.0–17.0)
Immature Granulocytes: 0 %
Lymphocytes Relative: 12 %
Lymphs Abs: 0.4 10*3/uL — ABNORMAL LOW (ref 0.7–4.0)
MCH: 28.7 pg (ref 26.0–34.0)
MCHC: 31.8 g/dL (ref 30.0–36.0)
MCV: 90.4 fL (ref 80.0–100.0)
Monocytes Absolute: 0.5 10*3/uL (ref 0.1–1.0)
Monocytes Relative: 14 %
Neutro Abs: 2.2 10*3/uL (ref 1.7–7.7)
Neutrophils Relative %: 59 %
Platelets: 196 10*3/uL (ref 150–400)
RBC: 2.82 MIL/uL — ABNORMAL LOW (ref 4.22–5.81)
RDW: 18.3 % — ABNORMAL HIGH (ref 11.5–15.5)
WBC: 3.8 10*3/uL — ABNORMAL LOW (ref 4.0–10.5)
nRBC: 0 % (ref 0.0–0.2)

## 2019-02-28 LAB — GLUCOSE, CAPILLARY
Glucose-Capillary: 111 mg/dL — ABNORMAL HIGH (ref 70–99)
Glucose-Capillary: 111 mg/dL — ABNORMAL HIGH (ref 70–99)

## 2019-02-28 LAB — BASIC METABOLIC PANEL
Anion gap: 12 (ref 5–15)
BUN: 65 mg/dL — ABNORMAL HIGH (ref 8–23)
CO2: 27 mmol/L (ref 22–32)
Calcium: 9.3 mg/dL (ref 8.9–10.3)
Chloride: 94 mmol/L — ABNORMAL LOW (ref 98–111)
Creatinine, Ser: 2.28 mg/dL — ABNORMAL HIGH (ref 0.61–1.24)
GFR calc Af Amer: 32 mL/min — ABNORMAL LOW (ref >60)
GFR calc non Af Amer: 28 mL/min — ABNORMAL LOW (ref >60)
Glucose, Bld: 116 mg/dL — ABNORMAL HIGH (ref 70–99)
Potassium: 4 mmol/L (ref 3.5–5.1)
Sodium: 133 mmol/L — ABNORMAL LOW (ref 135–145)

## 2019-02-28 MED ORDER — HEPARIN SOD (PORK) LOCK FLUSH 100 UNIT/ML IV SOLN
500.0000 [IU] | INTRAVENOUS | Status: AC | PRN
  Administered 2019-02-28: 500 [IU]

## 2019-02-28 MED ORDER — SENNOSIDES-DOCUSATE SODIUM 8.6-50 MG PO TABS: 2.0000 | ORAL_TABLET | Freq: Two times a day (BID) | ORAL | Status: AC

## 2019-02-28 MED ORDER — POLYETHYLENE GLYCOL 3350 17 G PO PACK: 17.0000 g | PACK | Freq: Every day | ORAL | 0 refills | Status: AC

## 2019-02-28 MED ORDER — CLONIDINE HCL 0.1 MG PO TABS: 0.1000 mg | ORAL_TABLET | Freq: Every day | ORAL | 0 refills | Status: DC

## 2019-02-28 MED ORDER — FUROSEMIDE 40 MG PO TABS: 40.0000 mg | ORAL_TABLET | Freq: Two times a day (BID) | ORAL | 3 refills | Status: DC

## 2019-02-28 MED ORDER — POTASSIUM CHLORIDE ER 20 MEQ PO TBCR: 20.0000 meq | EXTENDED_RELEASE_TABLET | Freq: Every day | ORAL | 0 refills | Status: DC

## 2019-02-28 MED ORDER — NYSTATIN 100000 UNIT/GM EX POWD: Freq: Three times a day (TID) | CUTANEOUS | 0 refills | Status: DC

## 2019-02-28 NOTE — Evaluation (Signed)
Occupational Therapy Evaluation Patient Details Name: Darrell [name] MRN: [id number] DOB: 1945-11-26 Today's Date: 02/28/2019    History of Present Illness 73 yo male admitted with CHF, L shoulder pain (sustained shoulder fx after fall on 7/21). Hx of lung ca, DM, CKD, recurrent pleural effusion-R pleurx catheter, AA, CHF, LE edema, fall, A fib   Clinical Impression   Pt admitted with CHF.Marland Kitchen Pt currently with functional limitations due to the deficits listed below (see OT Problem List).  Pt will benefit from skilled OT to increase their safety and independence with ADL and functional mobility for ADL to facilitate discharge to venue listed below.   Pts LUE with significand edema.  Pt and wife educated in retrograde massage for edema control as well as positioning.      Follow Up Recommendations  Follow surgeon's recommendation for DC plan and follow-up therapies;Home health OT    Equipment Recommendations  None recommended by OT    Recommendations for Other Services       Precautions / Restrictions Precautions Precautions: Fall Precaution Comments: fell in late July, pt reports no other falls in past 1 year, adjusted sling to have 90* flexion at L elbow Required Braces or Orthoses: Sling(L UE) Restrictions Weight Bearing Restrictions: Yes LUE Weight Bearing: Non weight bearing Other Position/Activity Restrictions: assume NWB LUE      Mobility Bed Mobility               General bed mobility comments: oob in recliner  Transfers Overall transfer level: Needs assistance Equipment used: None Transfers: Sit to/from Stand Sit to Stand: Min guard         General transfer comment: VCs for R hand placement, to scoot forward and lean forward    Balance Overall balance assessment: History of Falls;Needs assistance           Standing balance-Leahy Scale: Fair                             ADL either performed or assessed with clinical judgement   ADL  Overall ADL's : Needs assistance/impaired Eating/Feeding: Set up;Sitting   Grooming: Set up;Sitting   Upper Body Bathing: Minimal assistance;Sitting   Lower Body Bathing: Minimal assistance;Sit to/from stand;Cueing for safety;Cueing for sequencing   Upper Body Dressing : Minimal assistance;Sitting   Lower Body Dressing: Minimal assistance;Sit to/from stand;Cueing for compensatory techniques;Cueing for safety   Toilet Transfer: Minimal assistance;BSC   Toileting- Clothing Manipulation and Hygiene: Minimal assistance;Sit to/from stand         General ADL Comments: Pts LUE VERY swollen.  OT provided education regarding retrograde massage, ROM of elbow, wrist and hand.  Wife return demonstrated.     Vision Patient Visual Report: No change from baseline              Pertinent Vitals/Pain Pain Score: 4  Pain Location: LUE with retrograde massage     Hand Dominance Right   Extremity/Trunk Assessment Upper Extremity Assessment LUE Deficits / Details: sling 2* shoulder fx       Cervical / Trunk Assessment Cervical / Trunk Assessment: Normal   Communication Communication Communication: No difficulties   Cognition Arousal/Alertness: Awake/alert Behavior During Therapy: WFL for tasks assessed/performed Overall Cognitive Status: Within Functional Limits for tasks assessed  General Comments       Exercises General Exercises - Upper Extremity Elbow Flexion: AROM;Left;5 reps;Seated Wrist Flexion: AROM;Left;5 reps;Seated Digit Composite Flexion: AROM;Left;10 reps;Seated Hand Exercises Forearm Supination: AROM;Left;10 reps;Seated Forearm Pronation: AROM;Left;10 reps;Seated   Shoulder Instructions      Home Living Family/patient expects to be discharged to:: Private residence Living Arrangements: Spouse/significant other Available Help at Discharge: Family Type of Home: House Home Access: Level entry     Home  Layout: One level               Home Equipment: Cane - quad          Prior Functioning/Environment Level of Independence: Independent with assistive device(s)        Comments: using quad cane for longer distances        OT Problem List: Decreased strength;Decreased range of motion;Impaired balance (sitting and/or standing);Impaired UE functional use      OT Treatment/Interventions: Self-care/ADL training;Patient/family education;DME and/or AE instruction    OT Goals(Current goals can be found in the care plan section) Acute Rehab OT Goals Patient Stated Goal: home soon  OT Frequency: Min 2X/week   Barriers to D/C:               AM-PAC OT "6 Clicks" Daily Activity     Outcome Measure Help from another person eating meals?: A Little Help from another person taking care of personal grooming?: A Little Help from another person toileting, which includes using toliet, bedpan, or urinal?: A Little Help from another person bathing (including washing, rinsing, drying)?: A Little Help from another person to put on and taking off regular upper body clothing?: A Little Help from another person to put on and taking off regular lower body clothing?: A Little 6 Click Score: 18   End of Session Equipment Utilized During Treatment: Other (comment)(sling) Nurse Communication: Mobility status  Activity Tolerance: Patient tolerated treatment well Patient left: in chair;with call bell/phone within reach;with family/visitor present  OT Visit Diagnosis: Unsteadiness on feet (R26.81);Muscle weakness (generalized) (M62.81);History of falling (Z91.81)                Time: 8466-5993 OT Time Calculation (min): 34 min Charges:  OT Evaluation $OT Eval Moderate Complexity: 1 Mod OT Treatments $Self Care/Home Management : 8-22 mins  Kari Baars, OT Acute Rehabilitation Services [phone] Office- [phone], Edwena Felty D 02/28/2019, 1:29 PM

## 2019-02-28 NOTE — Progress Notes (Signed)
Physical Therapy Treatment Patient Details Name: Darrell [name] MRN: [id number] DOB: Jan 06, 1946 Today's Date: 02/28/2019    History of Present Illness 73 yo male admitted with CHF, L shoulder pain (sustained shoulder fx after fall on 7/21). Hx of lung ca, DM, CKD, recurrent pleural effusion-R pleurx catheter, AA, CHF, LE edema, fall, A fib    PT Comments    Pt with good walking speed and steadiness with single UE support this session. PT educated pt on the importance of rest breaks during mobility with elevated RR or feeling dyspneic, pt reports typically just pushing through. PT also discussed energy conservation to maintain mobility throughout the day. Pt tolerated LE exercises well, and PT encouraged pt to perform the 3 exercises listed below at home until HHPT begins to address LE weakness and edema. Pt and pt's wife agreeable. Pt plans to d/c home today.    Follow Up Recommendations  Supervision/Assistance - 24 hour;Home health PT     Equipment Recommendations  None recommended by PT    Recommendations for Other Services       Precautions / Restrictions Precautions Precautions: Fall Precaution Comments: fell in late July, pt reports no other falls in past 1 year, adjusted sling to have 90* flexion at L elbow Required Braces or Orthoses: Sling(L UE - wife assisted pt pt donning/doffing sling) Restrictions Weight Bearing Restrictions: Yes LUE Weight Bearing: Non weight bearing Other Position/Activity Restrictions: assume NWB LUE    Mobility  Bed Mobility               General bed mobility comments: oob in recliner - pt sleeps in recliner and does not need to practice bed mobility prior to d/c  Transfers Overall transfer level: Needs assistance Equipment used: None Transfers: Sit to/from Stand Sit to Stand: Min guard         General transfer comment: Min guard for safety, pt with proper use of RUE to push up to standing. Increased time to rise, steadying with  IV pole.  Ambulation/Gait Ambulation/Gait assistance: Min Gaffer (Feet): 170 Feet Assistive device: IV Pole Gait Pattern/deviations: Step-through pattern;Decreased stride length;Trunk flexed Gait velocity: slightly decr   General Gait Details: Min guard to supervision level assist for safety, pt with standing rest break at halfway ambulation point due to fatigue, DOE 2/4 and RR of 34 breaths/min. Sats 98-100% on RA, with HR to 107 bpm during ambulation.   Stairs             Wheelchair Mobility    Modified Rankin (Stroke Patients Only)       Balance Overall balance assessment: History of Falls;Needs assistance   Sitting balance-Leahy Scale: Good     Standing balance support: Single extremity supported Standing balance-Leahy Scale: Fair                              Cognition Arousal/Alertness: Awake/alert Behavior During Therapy: WFL for tasks assessed/performed Overall Cognitive Status: Within Functional Limits for tasks assessed                                        Exercises General Exercises - Upper Extremity Elbow Flexion: AROM;Left;5 reps;Seated Wrist Flexion: AROM;Left;5 reps;Seated Digit Composite Flexion: AROM;Left;10 reps;Seated General Exercises - Lower Extremity Ankle Circles/Pumps: AROM;Both;20 reps;Seated Long Arc Quad: AROM;Both;10 reps;Seated Hip ABduction/ADduction: AAROM;Both;10 reps;Seated Hand Exercises Forearm  Supination: AROM;Left;10 reps;Seated Forearm Pronation: AROM;Left;10 reps;Seated    General Comments        Pertinent Vitals/Pain Pain Assessment: 0-10 Pain Score: 5  Pain Location: LUE Pain Descriptors / Indicators: Discomfort;Sore Pain Intervention(s): Limited activity within patient's tolerance;Monitored during session;Repositioned    Home Living Family/patient expects to be discharged to:: Private residence Living Arrangements: Spouse/significant other Available  Help at Discharge: Family Type of Home: House Home Access: Level entry   Home Layout: One level Home Equipment: Cane - quad      Prior Function Level of Independence: Independent with assistive device(s)      Comments: using quad cane for longer distances   PT Goals (current goals can now be found in the care plan section) Acute Rehab PT Goals Patient Stated Goal: home soon PT Goal Formulation: With patient Time For Goal Achievement: 03/11/19 Potential to Achieve Goals: Good Progress towards PT goals: Progressing toward goals    Frequency    Min 3X/week      PT Plan Current plan remains appropriate    Co-evaluation              AM-PAC PT "6 Clicks" Mobility   Outcome Measure  Help needed turning from your back to your side while in a flat bed without using bedrails?: A Little Help needed moving from lying on your back to sitting on the side of a flat bed without using bedrails?: A Little Help needed moving to and from a bed to a chair (including a wheelchair)?: A Little Help needed standing up from a chair using your arms (e.g., wheelchair or bedside chair)?: A Little Help needed to walk in hospital room?: A Little Help needed climbing 3-5 steps with a railing? : A Little 6 Click Score: 18    End of Session Equipment Utilized During Treatment: Gait belt Activity Tolerance: Patient tolerated treatment well Patient left: in chair;with call bell/phone within reach;with family/visitor present Nurse Communication: Mobility status PT Visit Diagnosis: Unsteadiness on feet (R26.81);History of falling (Z91.81)     Time: 4235-3614 PT Time Calculation (min) (ACUTE ONLY): 25 min  Charges:  $Gait Training: 8-22 mins $Therapeutic Exercise: 8-22 mins                    Darrell [name], PT Acute Rehabilitation Services Pager [phone]  Office [phone]  Darrell [name] 02/28/2019, 1:43 PM

## 2019-02-28 NOTE — Discharge Summary (Signed)
Physician Discharge Summary  Darrell [name]:[id number] DOB: Oct 23, 1945 DOA: 02/22/2019  PCP: [name], MD  Admit date: 02/22/2019 Discharge date: 02/28/2019  Time spent: 55 minutes  Recommendations for Outpatient Follow-up:  1. Follow-up with oncology as previously scheduled. 2. Follow-up with [name], MD in 1 to 2 weeks.  On follow-up patient need a basic metabolic profile done to follow-up on electrolytes and renal function.  Patient's diabetes will need to be reassessed as patient's Actos and metformin were discontinued on discharge.  Patient's clonidine dose also decreased for better blood pressure management as patient started on diuretics.  Cardiology. 3. Follow-up with Dr. Marlou Porch cardiology.  Office will call with appointment time. 4. Follow-up with Kathrynn Humble, NP, cardiology on 04/04/2019.   Discharge Diagnoses:  Principal Problem:   Acute diastolic CHF (congestive heart failure) (HCC) Active Problems:   DM2 (diabetes mellitus, type 2) (HCC)   HYPERTENSION, BENIGN   Stage III squamous cell cancer   CKD (chronic kidney disease) stage 3, GFR 30-59 ml/min (HCC)   Thoracic aortic aneurysm without rupture (HCC)   COPD (chronic obstructive pulmonary disease) ([place])   Discharge Condition: Stable and improved  Diet recommendation: Heart healthy  Filed Weights   02/26/19 0530 02/27/19 0512 02/28/19 0500  Weight: 123.7 kg 123.3 kg 124.5 kg    History of present illness:  HPI per Dr. Delray Alt is a 73 y.o. 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  Patient states he has been getting increasing weight over the last few weeks  and may have gained over 10 pounds.  He is getting more short of breath on exertion denies any chest pain fever chills productive cough.  Patient's oncologist had placed patient on Lasix 20 mg daily for last 3 days despite which patient shortness of breath does not improve.  Patient had a recent left shoulder fracture.  ED Course: On exam in the ER patient has significant peripheral edema elevated JVD with chest x-ray does not show anything acute.  EKG shows A. fib rate controlled.  Afebrile COVID-19 test was negative.  Labs show creatinine 1.9 albumin 3.3 BNP of 501.4 high-sensitivity troponin VI hemoglobin 8.6 at baseline.  Patient was started on Lasix 40 mg IV by me and admitted for likely new onset CHF.  Hospital Course:  1 acute diastolic CHF Patient had presented with worsening shortness of breath, increasing peripheral edema, increasing weight gain and felt he may have gained about 10 pounds.  Patient noted to be more short of breath without any chest pain fever chills or productive cough.  Patient noted to have been placed by his oncologist on Lasix 20 mg daily x3 days despite with patient continued to have shortness of breath and subsequently presented to the ED.  Patient admitted cardiology consulted and patient placed on IV Lasix.  2D echo done showed an EF of 60 to 65% with no wall motion abnormalities.  IV Lasix dose was adjusted and cardiology followed the patient throughout the hospitalization.  Patient diuresed well with significant clinical improvement.  Patient was -5.5 L during this hospitalization.  As patient's IV Lasix dose was increased patient was noted to have a bump in his creatinine up to 2.01.  IV Lasix dose was subsequently held.  Repeat lab work on day of discharge showed a creatinine up to 2.28.  Was down to 274.5 pounds from a high of 283.6 pounds.  Patient improved clinically and cardiology recommending transitioning to oral Lasix 40 mg twice daily with close outpatient  follow-up.  Patient will be discharged home in stable and improved condition.  Patient be discharged on Lasix 40 mg twice daily.  Patient will need repeat lab work done in 1 week to follow-up on electrolytes and renal function.  Per patient and wife patient already scheduled for lab work at his oncologist office next week.  Outpatient follow-up arrangement will be made per cardiology for follow-up.  Patient's Actos was discontinued on discharge due to concerns of CHF.  Outpatient follow-up with cardiology and PCP.  2.  Frequent PACs On admission there was some concern for A. fib with RVR and as such cardiology was consulted for new onset A. fib with RVR.  Patient was seen in consultation by Dr. Marlou Porch on 02/23/2019 and upon further review of EKG it was felt like patient was in normal rhythm with frequent PACs and not atrial fibrillation.  It was noted that a lot of noise on telemetry made it very difficult to tell any underlying rhythm.  Repeat EKG which was ordered showed normal sinus rhythm with PACs.  Patient was started on Eliquis for possible A. fib on 02/24/2019 which was subsequently discontinued per cardiology.  Patient was maintained on home regimen of Coreg.  Outpatient follow-up with cardiology.  3.  Hypertension Remained controlled on Coreg.  Patient's clonidine dose was decreased to 0.1 mg daily as patient also started on Lasix.  Outpatient follow-up with PCP.  4.  Left proximal humerus fracture Secondary to mechanical fall 3 weeks prior to admission.  Patient with significant left upper extremity edema.  Doppler ultrasound obtained was negative for DVT.  Outpatient follow-up with orthopedics.  5.  Acute on chronic kidney disease stage III With diuresis patient's creatinine noted to creep up to 2.01 from a baseline of approximately 1.5-1.6.  Patient was diuresed with IV Lasix which was subsequently held with bump in creatinine.  Creatinine stabilized at 2.28 by day of discharge.  Patient be  discharged home on oral Lasix.  Close outpatient follow-up with PCP.  Patient's metformin was discontinued during the hospitalization.  6.  Well-controlled diabetes mellitus type 2 Hemoglobin A1c noted to be 5.8 on 02/23/2019.  Patient was maintained on Levemir and sliding scale insulin during the hospitalization.  Patient's Actos was held as well as metformin which will not be resumed on discharge as patient had presented with acute CHF and worsening renal function.  Outpatient follow-up with PCP.  7.  Hypokalemia Patient noted to have potassium drop as low as 2.8 on 02/24/2019.  Felt likely secondary to diuresis.  Potassium was repleted and potassium was 4.0 by day of discharge.  Patient will be discharged home on oral Lasix as well as K. Dur 20 mEq daily for the next 5 to 7 days.  Patient will need repeat labs done.  Outpatient follow-up.  8.  Stable fusiform AAA measuring 4.4 cm Outpatient follow-up with annual imaging follow-up by CTA or MRA.  Procedures:  2D echo 02/23/2019  Chest x-ray 02/22/2019, 02/26/2019  Plain films of the left shoulder 02/25/2019  Left upper extremity ultrasound 02/26/2019  Consultations:  Cardiology: Dr. Marlou Porch 02/23/2019  Discharge Exam: Vitals:   02/28/19 1006 02/28/19 1302  BP: (!) 99/54 108/62  Pulse:  62 66  Resp:  18  Temp:  97.9 F (36.6 C)  SpO2:  99%    General: NAD Cardiovascular: RRR no murmurs rubs or gallops.  2+ bilateral lower extremity edema. Respiratory: CTAB  Discharge Instructions   Discharge Instructions    Diet - low sodium heart healthy   Complete by: As directed    Increase activity slowly   Complete by: As directed      Allergies as of 02/28/2019      Reactions   Lisinopril Cough         Medication List    STOP taking these medications   hydrochlorothiazide 25 MG tablet Commonly known as: HYDRODIURIL   losartan 50 MG tablet Commonly known as: COZAAR   metFORMIN 1000 MG tablet Commonly known as: GLUCOPHAGE    pioglitazone 45 MG tablet Commonly known as: ACTOS     TAKE these medications   albuterol 108 (90 Base) MCG/ACT inhaler Commonly known as: VENTOLIN HFA Inhale 2 puffs into the lungs every 4 (four) hours as needed for wheezing or shortness of breath.   cloNIDine 0.1 MG tablet Commonly known as: CATAPRES Take 1 tablet (0.1 mg total) by mouth daily. What changed: when to take this   Coreg 6.25 MG tablet Generic drug: carvedilol Take 6.25 mg by mouth 2 (two) times daily with a meal.   fenofibrate 160 MG tablet Take 160 mg by mouth daily.   Fifty50 Pen Needles 31G X 8 MM Misc Generic drug: Insulin Pen Needle USE AS DIRECTED   furosemide 40 MG tablet Commonly known as: LASIX Take 1 tablet (40 mg total) by mouth 2 (two) times daily. What changed:   medication strength  how much to take  how to take this  when to take this  additional instructions   Levemir FlexTouch 100 UNIT/ML Pen Generic drug: Insulin Detemir Inject 10 Units into the skin daily.   lidocaine-prilocaine cream Commonly known as: EMLA Apply 1 application topically as needed. What changed: reasons to take this   Multi-Vitamins Tabs Take 1 tablet by mouth daily.   nystatin powder Commonly known as: MYCOSTATIN/NYSTOP Apply topically 3 (three) times daily.   Omega-3 1000 MG Caps Take 1,200 mg by mouth 2 (two) times daily.   oxyCODONE-acetaminophen 5-325 MG tablet Commonly known as: PERCOCET/ROXICET Take 1 tablet by mouth every 4 (four) hours as needed for severe pain. What changed: Another medication with the same name was removed. Continue taking this medication, and follow the directions you see here.   polyethylene glycol 17 g packet Commonly known as: MIRALAX / GLYCOLAX Take 17 g by mouth daily. Start taking on: March 01, 2019   Potassium Chloride ER 20 MEQ Tbcr Take 20 mEq by mouth daily for 7 days.   pravastatin 40 MG tablet Commonly known as: PRAVACHOL Take 40 mg by mouth  daily.   prochlorperazine 10 MG tablet Commonly known as: COMPAZINE TAKE 1 TABLET BY MOUTH EVERY 6 HOURS AS NEEDED FOR NAUSEA OR VOMITING What changed: See the new instructions.   senna-docusate 8.6-50 MG tablet Commonly known as: Senokot-S Take 2 tablets by mouth 2 (two) times daily.   traMADol 50 MG tablet Commonly known as: ULTRAM Take 1 tablet (50 mg total) by mouth every 6 (six) hours as needed for severe pain.            Durable Medical Equipment  (From admission, onward)         Start     Ordered   02/27/19  1254  For home use only DME Walker rolling  Once    Question:  Patient needs a walker to treat with the following condition  Answer:  Alcohol abuse   02/27/19 1253         Allergies  Allergen Reactions  . Lisinopril Cough        Follow-up Information    Burtis Junes, NP Follow up.   Specialties: Nurse Practitioner, Interventional Cardiology, Cardiology, Radiology Why: You have a follow-up visit scheduled for 04/04/2019 at 2:00pm with Truitt Merle, one of our office's NPs. Please arrive 15 minutes early for check-in.  Contact information: [place]. [address] [place] [address] [phone]        [facility] Follow up.   Specialty: Home Health Services Why: [facility]-start of care over weekend       [name], MD. Schedule an appointment as soon as possible for a visit in 1 week(s).   Specialty: Internal Medicine Why: f/u in 1-2 weeks. Contact information: [place] [address] [phone]        Jerline Pain, MD .   Specialty: Cardiology Contact information: [address] [place] [address] [phone]            The results of significant diagnostics from this hospitalization (including imaging, microbiology, ancillary and laboratory) are listed below for reference.    Significant Diagnostic Studies: Dg Chest 2  View  Result Date: 02/22/2019 CLINICAL DATA:  Shortness of breath. EXAM: CHEST - 2 VIEW COMPARISON:  January 31, 2019 FINDINGS: Injectable port in stable position. Normal cardiac silhouette. Stable volume loss in the right hemithorax with loculated pleural effusion. Stable soft tissue thickening in the right perihilar region. The left lung is clear. Osseous structures are without acute abnormality. Soft tissues are grossly normal. IMPRESSION: 1. Stable volume loss in the right hemithorax with loculated pleural effusion. 2. Stable soft tissue thickening in the right perihilar region. Electronically Signed   By: Fidela Salisbury M.D.   On: 02/22/2019 15:58   Ir Guided Niel Hummer W Catheter Placement  Result Date: 01/31/2019 INDICATION: 73 year old male with a history of malignant right-sided pleural effusion with nonfunctioning existing PleurX catheter which was placed Nov 15, 2018 EXAM: IMAGE GUIDED PLACEMENT OF A NEW RIGHT-SIDED PLEURAL TUNNELED CATHETER AND REMOVAL OF THE PRIOR CATHETER MEDICATIONS: 2 G ANCEF the antibiotics were administered within an appropriate time frame prior to the initiation of the procedure. ANESTHESIA/SEDATION: Fentanyl 50 mcg IV; Versed 1.0 mg IV Moderate Sedation Time:  20 MINUTES The patient was continuously monitored during the procedure by the interventional radiology nurse under my direct supervision. COMPLICATIONS: None PROCEDURE: The procedure, risks, benefits, and alternatives were explained to the patient and the patient's family. Specific risks that were addressed included bleeding, infection, pneumothorax, need for further procedure, chance of delayed pneumothorax or hemorrhage, hemoptysis, cardiopulmonary collapse, death. Questions regarding the procedure were encouraged and answered. The patient understands and consents to the procedure. The right chest wall and the indwelling catheter was prepped with chlorhexidine in a sterile fashion, and a sterile drape was applied  covering the operative field. A sterile gown and sterile gloves were used for the procedure. Local anesthesia was provided with 1% Lidocaine. Ultrasound image documentation was performed. After creating a small skin incision, a 19 gauge needle was advanced into the pleural cavity/pleural fluid under ultrasound guidance. A guide wire was then advanced under fluoroscopy into the pleural space. Pleural  access was dilated serially and a 16-French peel-away sheath placed. The skin and subcutaneous tissues were generously infiltrated with 1% lidocaine from the puncture site over the pleura along the intercostal margin anteriorly. A small stab incision was made with 11 blade scalpel at the insertion site of the catheter, and the catheter was back tunneled to the site at the pleural puncture. A tunneled CareFusion Pleurex catheter was placed. This was tunneled from the incision 5 cm anterior to the pleural access to the access site. The catheter was advanced through the peel-away sheath. The sheath was then removed. At this point the prior tunneled catheter was removed from the pleural space. Final catheter positioning was confirmed with a fluoroscopic spot image. Both access incision sites were closed with Derma bond after chlorhexidine wash was again applied. Dermabond was applied to the catheterization incision. Approximately 400 cc of fluid performed via thoracentesis through the new catheter utilizing vacuum bottle The patient tolerated the procedure well and remained hemodynamically stable throughout. No complications were encountered and no significant blood loss was encountered. FINDINGS: Ultrasound demonstrates complex fluid within the dependent aspects of the right pleural space indicating proteinaceous/loculated fluid. Inspissated blood and debris found within the remote tunneled catheter occupying approximately 50% of the length of the lumen of the catheter 400 cc of amber fluid removed through the current  catheter with the final image demonstrating residual scarring/complex fluid/loculated fluid at the right lung base. The new tunneled pleural catheter appears subpleural. IMPRESSION: Status post placement of a new PleurX catheter/tunneled catheter into the right residual pleural fluid, with removal of the prior tunneled PleurX catheter. Signed, Dulcy Fanny. Dellia Nims, RPVI [facility] Electronically Signed   By: Corrie Mckusick D.O.   On: 01/31/2019 10:18   Dg Chest Port 1 View  Result Date: 02/26/2019 CLINICAL DATA:  Shortness of breath EXAM: PORTABLE CHEST 1 VIEW COMPARISON:  01/16/2019 chest CT FINDINGS: Decreased pleuroparenchymal opacity at the right base. There is right perihilar radiation fibrosis. Cardiomegaly. Unremarkable left-sided porta catheter. IMPRESSION: 1. Decreased pleural fluid on the right. 2. Right perihilar fibrosis and chronic volume loss. Electronically Signed   By: Monte Fantasia M.D.   On: 02/26/2019 04:27   Dg Chest Port 1 View  Result Date: 01/31/2019 CLINICAL DATA:  Preop film for drainage of lung. EXAM: PORTABLE CHEST 1 VIEW COMPARISON:  January 06, 2019 FINDINGS: The heart size and mediastinal contours are stable. Moderate right pleural effusion with consolidation of the right mid and lower lung are unchanged. Consolidation of the right perihilar lung is identified unchanged. Right chest tube is unchanged. The visualized skeletal structures are stable. IMPRESSION: Moderate right pleural effusion with consolidation of the right mid and lower lung are unchanged. Consolidation of the right perihilar lung is unchanged. Electronically Signed   By: Abelardo Diesel M.D.   On: 01/31/2019 09:34   Dg Shoulder Left  Result Date: 02/25/2019 CLINICAL DATA:  Pain status post fall EXAM: LEFT SHOULDER - 2+ VIEW COMPARISON:  January 30, 2019 FINDINGS: There is a partially visualized left subclavian Port-A-Cath. Again noted is an impacted  fracture of the left humeral head and neck. The fracture appears slightly more impacted on today's exam. There is mild surrounding soft tissue swelling. There is some evidence of mild interval callus formation. There is no glenohumeral dislocation. Osteopenia is noted. IMPRESSION: 1. Again noted is an impacted fracture of the proximal left humerus. There has been slight interval increase in impaction since the prior study.  There is minimal surrounding callus formation. 2. No glenohumeral dislocation. Electronically Signed   By: Constance Holster M.D.   On: 02/25/2019 16:50   Dg Shoulder Left  Result Date: 01/30/2019 CLINICAL DATA:  73 year old who fell and injured the LEFT shoulder earlier today. Patient currently unable to abduct the arm. Initial encounter. EXAM: LEFT SHOULDER - 2+ VIEW COMPARISON:  None. FINDINGS: Acute impacted fracture involving the humeral head and neck. An apparent free fragment is present which includes the greater tuberosity. Glenohumeral joint anatomically aligned with well-preserved joint space. Acromioclavicular joint anatomically aligned with only mild degenerative changes. Port-A-Cath port overlies the lower shoulder joint on the AP image. IMPRESSION: 1. Acute traumatic impacted fracture involving the humeral head and neck. 2. Apparent free fragment which includes the greater tuberosity. Electronically Signed   By: Evangeline Dakin M.D.   On: 01/30/2019 14:00   Vas [place] Upper Extremity Venous Duplex  Result Date: 02/26/2019 UPPER VENOUS STUDY  Indications: Swelling, and recent left shoulder fracture Limitations: Restricted mobility, body habitus and poor ultrasound/tissue interface. Comparison Study: No prior study. Performing Technologist: Maudry Mayhew MHA, RDMS, RVT, RDCS  Examination Guidelines: A complete evaluation includes B-mode imaging, spectral Doppler, color Doppler, and power Doppler as needed of all accessible portions of each vessel. Bilateral testing is  considered an integral part of a complete examination. Limited examinations for reoccurring indications may be performed as noted.  Right Findings: +----------+------------+---------+-----------+----------+--------------+ RIGHT     CompressiblePhasicitySpontaneousProperties   Summary     +----------+------------+---------+-----------+----------+--------------+ Subclavian                                          Not visualized +----------+------------+---------+-----------+----------+--------------+  Left Findings: +----------+------------+---------+-----------+----------+--------------+ LEFT      CompressiblePhasicitySpontaneousProperties   Summary     +----------+------------+---------+-----------+----------+--------------+ IJV           Full    pulsatile    Yes                             +----------+------------+---------+-----------+----------+--------------+ Subclavian            pulsatile    Yes                             +----------+------------+---------+-----------+----------+--------------+ Axillary      Full    pulsatile    Yes                             +----------+------------+---------+-----------+----------+--------------+ Brachial      Full                 Yes                             +----------+------------+---------+-----------+----------+--------------+ Radial        Full                                                 +----------+------------+---------+-----------+----------+--------------+ Ulnar  Not visualized +----------+------------+---------+-----------+----------+--------------+ Cephalic      Full                                                 +----------+------------+---------+-----------+----------+--------------+ Basilic                                             Not visualized +----------+------------+---------+-----------+----------+--------------+  Summary:   Left: No evidence of deep vein thrombosis in the upper extremity. No evidence of superficial vein thrombosis in the upper extremity. This was a limited study.  *See table(s) above for measurements and observations.  Diagnosing physician: Servando Snare MD Electronically signed by Servando Snare MD on 02/26/2019 at 9:56:17 PM.    Final    Ir Removal Of Plural Cath W/cuff  Result Date: 01/31/2019 INDICATION: 73 year old male with a history of malignant right-sided pleural effusion with nonfunctioning existing PleurX catheter which was placed Nov 15, 2018 EXAM: IMAGE GUIDED PLACEMENT OF A NEW RIGHT-SIDED PLEURAL TUNNELED CATHETER AND REMOVAL OF THE PRIOR CATHETER MEDICATIONS: 2 G ANCEF the antibiotics were administered within an appropriate time frame prior to the initiation of the procedure. ANESTHESIA/SEDATION: Fentanyl 50 mcg IV; Versed 1.0 mg IV Moderate Sedation Time:  20 MINUTES The patient was continuously monitored during the procedure by the interventional radiology nurse under my direct supervision. COMPLICATIONS: None PROCEDURE: The procedure, risks, benefits, and alternatives were explained to the patient and the patient's family. Specific risks that were addressed included bleeding, infection, pneumothorax, need for further procedure, chance of delayed pneumothorax or hemorrhage, hemoptysis, cardiopulmonary collapse, death. Questions regarding the procedure were encouraged and answered. The patient understands and consents to the procedure. The right chest wall and the indwelling catheter was prepped with chlorhexidine in a sterile fashion, and a sterile drape was applied covering the operative field. A sterile gown and sterile gloves were used for the procedure. Local anesthesia was provided with 1% Lidocaine. Ultrasound image documentation was performed. After creating a small skin incision, a 19 gauge needle was advanced into the pleural cavity/pleural fluid under ultrasound guidance. A guide wire was  then advanced under fluoroscopy into the pleural space. Pleural access was dilated serially and a 16-French peel-away sheath placed. The skin and subcutaneous tissues were generously infiltrated with 1% lidocaine from the puncture site over the pleura along the intercostal margin anteriorly. A small stab incision was made with 11 blade scalpel at the insertion site of the catheter, and the catheter was back tunneled to the site at the pleural puncture. A tunneled CareFusion Pleurex catheter was placed. This was tunneled from the incision 5 cm anterior to the pleural access to the access site. The catheter was advanced through the peel-away sheath. The sheath was then removed. At this point the prior tunneled catheter was removed from the pleural space. Final catheter positioning was confirmed with a fluoroscopic spot image. Both access incision sites were closed with Derma bond after chlorhexidine wash was again applied. Dermabond was applied to the catheterization incision. Approximately 400 cc of fluid performed via thoracentesis through the new catheter utilizing vacuum bottle The patient tolerated the procedure well and remained hemodynamically stable throughout. No complications were encountered and no significant blood loss was encountered. FINDINGS: Ultrasound demonstrates complex fluid within the dependent  aspects of the right pleural space indicating proteinaceous/loculated fluid. Inspissated blood and debris found within the remote tunneled catheter occupying approximately 50% of the length of the lumen of the catheter 400 cc of amber fluid removed through the current catheter with the final image demonstrating residual scarring/complex fluid/loculated fluid at the right lung base. The new tunneled pleural catheter appears subpleural. IMPRESSION: Status post placement of a new PleurX catheter/tunneled catheter into the right residual pleural fluid, with removal of the prior tunneled PleurX catheter.  Signed, Dulcy Fanny. Dellia Nims, RPVI [facility] Radiology Electronically Signed   By: Corrie Mckusick D.O.   On: 01/31/2019 10:18    Microbiology: Recent Results (from the past 240 hour(s))  SARS Coronavirus 2 [facility] order, Performed in [facility] hospital lab) Nasopharyngeal Nasopharyngeal Swab     Status: None   Collection Time: 02/22/19 11:33 PM   Specimen: Nasopharyngeal Swab  Result Value Ref Range Status   SARS Coronavirus 2 NEGATIVE NEGATIVE Final    Comment: (NOTE) If result is NEGATIVE SARS-CoV-2 target nucleic acids are NOT DETECTED. The SARS-CoV-2 RNA is generally detectable in upper and lower  respiratory specimens during the acute phase of infection. The lowest  concentration of SARS-CoV-2 viral copies this assay can detect is 250  copies / mL. A negative result does not preclude SARS-CoV-2 infection  and should not be used as the sole basis for treatment or other  patient management decisions.  A negative result may occur with  improper specimen collection / handling, submission of specimen other  than nasopharyngeal swab, presence of viral mutation(s) within the  areas targeted by this assay, and inadequate number of viral copies  (<250 copies / mL). A negative result must be combined with clinical  observations, patient history, and epidemiological information. If result is POSITIVE SARS-CoV-2 target nucleic acids are DETECTED. The SARS-CoV-2 RNA is generally detectable in upper and lower  respiratory specimens dur ing the acute phase of infection.  Positive  results are indicative of active infection with SARS-CoV-2.  Clinical  correlation with patient history and other diagnostic information is  necessary to determine patient infection status.  Positive results do  not rule out bacterial infection or co-infection with other viruses. If result is PRESUMPTIVE POSTIVE SARS-CoV-2 nucleic acids MAY BE PRESENT.   A  presumptive positive result was obtained on the submitted specimen  and confirmed on repeat testing.  While 2019 novel coronavirus  (SARS-CoV-2) nucleic acids may be present in the submitted sample  additional confirmatory testing may be necessary for epidemiological  and / or clinical management purposes  to differentiate between  SARS-CoV-2 and other Sarbecovirus currently known to infect humans.  If clinically indicated additional testing with an alternate test  methodology [phone]) is advised. The SARS-CoV-2 RNA is generally  detectable in upper and lower respiratory sp ecimens during the acute  phase of infection. The expected result is Negative. Fact Sheet for Patients:  StrictlyIdeas.no Fact Sheet for Healthcare Providers: BankingDealers.co.za This test is not yet approved or cleared by the [place] FDA and has been authorized for detection and/or diagnosis of SARS-CoV-2 by FDA under an Emergency Use Authorization (EUA).  This EUA will remain in effect (meaning this test can be used) for the duration of the COVID-19 declaration under Section 564(b)(1) of the Act, 21 U.S.C. section 360bbb-3(b)(1), unless the authorization is terminated or revoked sooner. Performed at [facility], [place] [address] [place], [place] [address]  Labs: Basic Metabolic Panel: Recent Labs  Lab 02/23/19 0636 02/24/19 0542 02/24/19 1745 02/25/19 0900 02/26/19 0527 02/27/19 0406 02/28/19 1159  NA 135 134* 136 135 138 135 133*  K 3.3* 2.8* 3.0* 3.0* 3.7 4.2 4.0  CL 99 97* 96* 95* 96* 97* 94*  CO2 23 26 28 28 27 28 27   GLUCOSE 107* 103* 120* 112* 113* 112* 116*  BUN 54* 48* 49* 46* 51* 55* 65*  CREATININE 1.94* 1.64* 1.78* 1.57* 1.75* 2.01* 2.28*  CALCIUM 9.3 9.1 9.2 9.3 9.3 9.1 9.3  MG 2.1 2.1  --  1.9 1.9  --   --    Liver Function Tests: Recent Labs  Lab 02/22/19 2120  AST 26  ALT 17  ALKPHOS 47  BILITOT 0.9   PROT 6.8  ALBUMIN 3.3*   Recent Labs  Lab 02/22/19 2120  LIPASE 18   No results for input(s): AMMONIA in the last 168 hours. CBC: Recent Labs  Lab 02/22/19 2120 02/26/19 0527 02/27/19 0406 02/28/19 0318  WBC 2.9* 3.4* 3.6* 3.8*  NEUTROABS 2.0 2.1 2.3 2.2  HGB 8.6* 8.7* 8.4* 8.1*  HCT 27.3* 27.2* 26.2* 25.5*  MCV 92.5 91.0 91.3 90.4  PLT 164 194 206 196   Cardiac Enzymes: No results for input(s): CKTOTAL, CKMB, CKMBINDEX, TROPONINI in the last 168 hours. BNP: BNP (last 3 results) Recent Labs    02/22/19 2120  BNP 501.4*    ProBNP (last 3 results) No results for input(s): PROBNP in the last 8760 hours.  CBG: Recent Labs  Lab 02/27/19 1211 02/27/19 1631 02/27/19 2111 02/28/19 0741 02/28/19 1202  GLUCAP 118* 126* 141* 111* 111*       Signed:  Irine Seal MD.  [facility] 02/28/2019, 3:17 PM

## 2019-02-28 NOTE — TOC Transition Note (Signed)
Transition of Care [facility]) - CM/SW Discharge Note   Patient Details  Name: Darrell [name] MRN: [id number] Date of Birth: Aug 13, 1945  [facility] of [facility]) CM/SW Contact:  Dessa Phi, RN Phone Number: 02/28/2019, 12:22 PM   Clinical Narrative: [facility] rep Santiago Glad aware of HHPT order-start of care over weekend-MD,patient /spouse in agreement. No further CM needs.      Final next level of care: [place] Barriers to Discharge: No Barriers Identified   Patient Goals and CMS Choice Patient states their goals for this hospitalization and ongoing recovery are:: go home CMS Medicare.gov Compare Post Acute Care list provided to:: Patient Choice offered to / list presented to : Patient  Discharge Placement                       Discharge Plan and Services                          HH Arranged: PT [facility] Agency: [place] ([facility]) Date [place]: 02/28/19 Time [place]: 1222 Representative spoke with at [place]: [place] ([employer] Interventions     Readmission Risk Interventions Readmission Risk Prevention Plan 02/23/2019  Transportation Screening Complete  PCP or Specialist Appt within 3-5 Days Not Complete  Not Complete comments Not ready for dc  HRI or [place] Not Complete  HRI or Home Care Consult comments NA  Social Work Consult for [place] Planning/Counseling Not Complete  SW consult not completed comments NA  Palliative Care Screening Not Applicable  Medication Review Press photographer) Complete  Some recent data might be hidden

## 2019-03-04 ENCOUNTER — Encounter: Payer: Self-pay | Admitting: Nurse Practitioner

## 2019-03-05 ENCOUNTER — Inpatient Hospital Stay: Payer: Medicare Other | Admitting: Physician Assistant

## 2019-03-05 ENCOUNTER — Telehealth: Payer: Self-pay | Admitting: Medical Oncology

## 2019-03-05 ENCOUNTER — Inpatient Hospital Stay: Payer: Medicare Other

## 2019-03-05 NOTE — Telephone Encounter (Signed)
Cancelled appt today - pt not up to it . He just got out of hospital a few days ago.

## 2019-03-06 ENCOUNTER — Inpatient Hospital Stay (HOSPITAL_COMMUNITY)
Admission: EM | Admit: 2019-03-06 | Discharge: 2019-03-13 | DRG: 377 | Disposition: A | Discharge status: Home / Self Care | Payer: Medicare Other | Attending: Internal Medicine | Admitting: Internal Medicine

## 2019-03-06 ENCOUNTER — Encounter (HOSPITAL_COMMUNITY): Payer: Self-pay | Admitting: Family Medicine

## 2019-03-06 ENCOUNTER — Other Ambulatory Visit: Payer: Self-pay

## 2019-03-06 ENCOUNTER — Emergency Department (HOSPITAL_COMMUNITY): Payer: Medicare Other

## 2019-03-06 ENCOUNTER — Telehealth: Payer: Self-pay | Admitting: *Deleted

## 2019-03-06 DIAGNOSIS — I82622 Acute embolism and thrombosis of deep veins of left upper extremity: Secondary | ICD-10-CM | POA: Diagnosis present

## 2019-03-06 DIAGNOSIS — G9341 Metabolic encephalopathy: Secondary | ICD-10-CM | POA: Diagnosis present

## 2019-03-06 DIAGNOSIS — Z803 Family history of malignant neoplasm of breast: Secondary | ICD-10-CM

## 2019-03-06 DIAGNOSIS — E119 Type 2 diabetes mellitus without complications: Secondary | ICD-10-CM

## 2019-03-06 DIAGNOSIS — Z20828 Contact with and (suspected) exposure to other viral communicable diseases: Secondary | ICD-10-CM | POA: Diagnosis present

## 2019-03-06 DIAGNOSIS — N183 Chronic kidney disease, stage 3 unspecified: Secondary | ICD-10-CM | POA: Diagnosis present

## 2019-03-06 DIAGNOSIS — K922 Gastrointestinal hemorrhage, unspecified: Principal | ICD-10-CM | POA: Diagnosis present

## 2019-03-06 DIAGNOSIS — N179 Acute kidney failure, unspecified: Secondary | ICD-10-CM | POA: Diagnosis present

## 2019-03-06 DIAGNOSIS — I712 Thoracic aortic aneurysm, without rupture: Secondary | ICD-10-CM | POA: Diagnosis present

## 2019-03-06 DIAGNOSIS — Z794 Long term (current) use of insulin: Secondary | ICD-10-CM

## 2019-03-06 DIAGNOSIS — Z79891 Long term (current) use of opiate analgesic: Secondary | ICD-10-CM

## 2019-03-06 DIAGNOSIS — Z87891 Personal history of nicotine dependence: Secondary | ICD-10-CM

## 2019-03-06 DIAGNOSIS — C3491 Malignant neoplasm of unspecified part of right bronchus or lung: Secondary | ICD-10-CM | POA: Diagnosis present

## 2019-03-06 DIAGNOSIS — D649 Anemia, unspecified: Secondary | ICD-10-CM

## 2019-03-06 DIAGNOSIS — Z79899 Other long term (current) drug therapy: Secondary | ICD-10-CM

## 2019-03-06 DIAGNOSIS — Z923 Personal history of irradiation: Secondary | ICD-10-CM

## 2019-03-06 DIAGNOSIS — I700 Atherosclerosis of aorta: Secondary | ICD-10-CM | POA: Diagnosis present

## 2019-03-06 DIAGNOSIS — I480 Paroxysmal atrial fibrillation: Secondary | ICD-10-CM | POA: Diagnosis present

## 2019-03-06 DIAGNOSIS — Z6841 Body Mass Index (BMI) 40.0 and over, adult: Secondary | ICD-10-CM

## 2019-03-06 DIAGNOSIS — E1122 Type 2 diabetes mellitus with diabetic chronic kidney disease: Secondary | ICD-10-CM | POA: Diagnosis present

## 2019-03-06 DIAGNOSIS — R0600 Dyspnea, unspecified: Secondary | ICD-10-CM | POA: Diagnosis present

## 2019-03-06 DIAGNOSIS — I5033 Acute on chronic diastolic (congestive) heart failure: Secondary | ICD-10-CM | POA: Diagnosis present

## 2019-03-06 DIAGNOSIS — R0602 Shortness of breath: Secondary | ICD-10-CM

## 2019-03-06 DIAGNOSIS — Z8249 Family history of ischemic heart disease and other diseases of the circulatory system: Secondary | ICD-10-CM

## 2019-03-06 DIAGNOSIS — K921 Melena: Secondary | ICD-10-CM | POA: Diagnosis present

## 2019-03-06 DIAGNOSIS — E785 Hyperlipidemia, unspecified: Secondary | ICD-10-CM | POA: Diagnosis present

## 2019-03-06 DIAGNOSIS — J90 Pleural effusion, not elsewhere classified: Secondary | ICD-10-CM

## 2019-03-06 DIAGNOSIS — I130 Hypertensive heart and chronic kidney disease with heart failure and stage 1 through stage 4 chronic kidney disease, or unspecified chronic kidney disease: Secondary | ICD-10-CM | POA: Diagnosis present

## 2019-03-06 DIAGNOSIS — Z978 Presence of other specified devices: Secondary | ICD-10-CM

## 2019-03-06 DIAGNOSIS — I10 Essential (primary) hypertension: Secondary | ICD-10-CM | POA: Diagnosis present

## 2019-03-06 DIAGNOSIS — D62 Acute posthemorrhagic anemia: Secondary | ICD-10-CM | POA: Diagnosis present

## 2019-03-06 DIAGNOSIS — R8290 Unspecified abnormal findings in urine: Secondary | ICD-10-CM | POA: Diagnosis present

## 2019-03-06 LAB — COMPREHENSIVE METABOLIC PANEL
ALT: 26 U/L (ref 0–44)
AST: 46 U/L — ABNORMAL HIGH (ref 15–41)
Albumin: 3.1 g/dL — ABNORMAL LOW (ref 3.5–5.0)
Alkaline Phosphatase: 33 U/L — ABNORMAL LOW (ref 38–126)
Anion gap: 11 (ref 5–15)
BUN: 98 mg/dL — ABNORMAL HIGH (ref 8–23)
CO2: 23 mmol/L (ref 22–32)
Calcium: 8.8 mg/dL — ABNORMAL LOW (ref 8.9–10.3)
Chloride: 102 mmol/L (ref 98–111)
Creatinine, Ser: 2.22 mg/dL — ABNORMAL HIGH (ref 0.61–1.24)
GFR calc Af Amer: 34 mL/min — ABNORMAL LOW (ref >60)
GFR calc non Af Amer: 29 mL/min — ABNORMAL LOW (ref >60)
Glucose, Bld: 154 mg/dL — ABNORMAL HIGH (ref 70–99)
Potassium: 3.6 mmol/L (ref 3.5–5.1)
Sodium: 136 mmol/L (ref 135–145)
Total Bilirubin: 1.2 mg/dL (ref 0.3–1.2)
Total Protein: 6.1 g/dL — ABNORMAL LOW (ref 6.5–8.1)

## 2019-03-06 LAB — URINALYSIS, ROUTINE W REFLEX MICROSCOPIC
Bilirubin Urine: NEGATIVE
Glucose, UA: NEGATIVE mg/dL
Hgb urine dipstick: NEGATIVE
Ketones, ur: NEGATIVE mg/dL
Nitrite: NEGATIVE
Protein, ur: NEGATIVE mg/dL
Specific Gravity, Urine: 1.013 (ref 1.005–1.030)
pH: 5 (ref 5.0–8.0)

## 2019-03-06 LAB — CBC WITH DIFFERENTIAL/PLATELET
Abs Immature Granulocytes: 0.02 10*3/uL (ref 0.00–0.07)
Basophils Absolute: 0 10*3/uL (ref 0.0–0.1)
Basophils Relative: 0 %
Eosinophils Absolute: 0.1 10*3/uL (ref 0.0–0.5)
Eosinophils Relative: 2 %
HCT: 15.7 % — ABNORMAL LOW (ref 39.0–52.0)
Hemoglobin: 5 g/dL — CL (ref 13.0–17.0)
Immature Granulocytes: 0 %
Lymphocytes Relative: 5 %
Lymphs Abs: 0.3 10*3/uL — ABNORMAL LOW (ref 0.7–4.0)
MCH: 29.6 pg (ref 26.0–34.0)
MCHC: 31.8 g/dL (ref 30.0–36.0)
MCV: 92.9 fL (ref 80.0–100.0)
Monocytes Absolute: 0.5 10*3/uL (ref 0.1–1.0)
Monocytes Relative: 9 %
Neutro Abs: 4.9 10*3/uL (ref 1.7–7.7)
Neutrophils Relative %: 84 %
Platelets: 191 10*3/uL (ref 150–400)
RBC: 1.69 MIL/uL — ABNORMAL LOW (ref 4.22–5.81)
RDW: 19.4 % — ABNORMAL HIGH (ref 11.5–15.5)
WBC: 5.9 10*3/uL (ref 4.0–10.5)
nRBC: 1.5 % — ABNORMAL HIGH (ref 0.0–0.2)

## 2019-03-06 LAB — BRAIN NATRIURETIC PEPTIDE: B Natriuretic Peptide: 334.3 pg/mL — ABNORMAL HIGH (ref 0.0–100.0)

## 2019-03-06 LAB — PREPARE RBC (CROSSMATCH)

## 2019-03-06 LAB — PROTIME-INR
INR: 1.3 — ABNORMAL HIGH (ref 0.8–1.2)
Prothrombin Time: 16.3 seconds — ABNORMAL HIGH (ref 11.4–15.2)

## 2019-03-06 LAB — POC OCCULT BLOOD, ED: Fecal Occult Bld: POSITIVE — AB

## 2019-03-06 MED ORDER — PANTOPRAZOLE SODIUM 40 MG IV SOLR
40.0000 mg | Freq: Once | INTRAVENOUS | Status: AC
  Administered 2019-03-06: 23:00:00 40 mg via INTRAVENOUS
  Filled 2019-03-06: qty 40

## 2019-03-06 MED ORDER — SODIUM CHLORIDE 0.9% IV SOLUTION
Freq: Once | INTRAVENOUS | Status: AC
  Administered 2019-03-07: 03:00:00 via INTRAVENOUS

## 2019-03-06 MED ORDER — FUROSEMIDE 10 MG/ML IJ SOLN: 40.0000 mg | Freq: Once | INTRAMUSCULAR | Status: DC

## 2019-03-06 NOTE — H&P (Signed)
History and Physical    Darrell [name] DOB: [birth year] DOA: 03/06/2019  PCP: [name], MD Patient coming from: Home  Chief Complaint: Shortness of breath  HPI: Darrell [name] is a 73 y.o. 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 [name] from oncology and currently on immunotherapy, recurrent pleural effusion with right-sided Pleurx catheter, CKD stage III, type 2 diabetes, hypertension, hyperlipidemia, thoracic aortic aneurysm, recent left proximal humerus fracture presenting to the hospital via EMS for evaluation of shortness of breath.  Patient reports 11-month history of dyspnea on exertion.  Denies any fevers, chills, chest pain, or cough.  States his legs are chronically swollen, currently improved.  Denies orthopnea.  States his Pleurx catheter has not been draining well recently.  Normally drains 1 L fluid in 3 days but recently has only been draining 100 cc every 3 days.  Denies noticing any blood in his stool or dark stools.  Denies any abdominal pain or hematemesis.  Denies history of prior GI bleed.  He has been taking Advil for shoulder pain from his fracture as oxycodone makes him itch.  Denies dysuria.  No other complaints.  ED Course: Afebrile.  Blood pressure slightly soft.  Not tachycardic.  No leukocytosis.  Hemoglobin 5.0 which is a 3 g drop from labs done 6 days ago.  Stool dark on exam, FOBT positive.  INR 1.3.  BUN 98, creatinine 2.2 (creatinine stable since labs done 6 days ago).  BNP 334, improved since labs done 12 days ago.  UA with moderate amount of leukocytes, 0-5 WBCs, rare bacteria, and negative nitrite.  Urine culture pending.  COVID-19 test pending.  Chest x-ray showing consolidative opacity in the right midlung with moderate right pleural effusion despite a right pleural drain in place.  Medications ordered include IV Lasix 40 mg, IV Protonix 40 mg, and 2 units PRBCs.  Review of  Systems:  All systems reviewed and apart from history of presenting illness, are negative.  Past Medical History:  Diagnosis Date  . Aortic atherosclerosis ([place]) 07/18/2018  . Cancer ([place])   . Cellulitis of right lower extremity   . Chronic kidney disease   . Diabetes mellitus without complication ([place])   . Dyslipidemia   . Hypertension   . Thoracic aortic aneurysm without rupture ([place]) 07/18/2018    Past Surgical History:  Procedure Laterality Date  . IR GUIDED DRAIN W CATHETER PLACEMENT  11/15/2018  . IR GUIDED DRAIN W CATHETER PLACEMENT  01/31/2019  . IR INSTILL VIA CHEST TUBE AGENT FOR FIBRINOLYSIS INI DAY  01/23/2019  . IR REMOVAL OF PLURAL CATH W/CUFF  01/31/2019  . IR THORACENTESIS ASP PLEURAL SPACE W/IMG GUIDE  09/05/2018  . IR THORACENTESIS ASP PLEURAL SPACE W/IMG GUIDE  10/11/2018  . IR THORACENTESIS ASP PLEURAL SPACE W/IMG GUIDE  10/30/2018  . PORTACATH PLACEMENT Left 02/21/2018   Procedure: INSERTION PORT-A-CATH;  Surgeon: Grace Isaac, MD;  Location: [place];  Service: Thoracic;  Laterality: Left;     reports that he quit smoking about 22 years ago. His smoking use included cigarettes. He has a 70.00 pack-year smoking history. He has never used smokeless tobacco. He reports current alcohol use. He reports that he does not use drugs.  Allergies  Allergen Reactions  . Percocet [Oxycodone-Acetaminophen] Itching  . Lisinopril Cough         Family History  Problem Relation Age of Onset  . Breast cancer Mother   .  CAD Father     Prior to Admission medications   Medication Sig Start Date End Date Taking? Authorizing Provider  albuterol (PROVENTIL HFA;VENTOLIN HFA) 108 (90 Base) MCG/ACT inhaler Inhale 2 puffs into the lungs every 4 (four) hours as needed for wheezing or shortness of breath. 08/03/18  Yes Byrum, Rose Fillers, MD  carvedilol (COREG) 6.25 MG tablet Take 6.25 mg by mouth 2 (two) times daily with a meal.  07/19/16  Yes [provider]  cloNIDine (CATAPRES) 0.1  MG tablet Take 1 tablet (0.1 mg total) by mouth daily. 02/28/19  Yes Eugenie Filler, MD  fenofibrate 160 MG tablet Take 160 mg by mouth daily.  11/13/17  Yes [provider]  furosemide (LASIX) 40 MG tablet Take 1 tablet (40 mg total) by mouth 2 (two) times daily. 02/28/19  Yes Eugenie Filler, MD  Insulin Detemir (LEVEMIR FLEXTOUCH) 100 UNIT/ML Pen Inject 10 Units into the skin daily.  07/18/15  Yes [provider]  lidocaine-prilocaine (EMLA) cream Apply 1 application topically as needed. Patient taking differently: Apply 1 application topically as needed (port).  02/16/18  Yes Curt Bears, MD  Multiple Vitamin (MULTI-VITAMINS) TABS Take 1 tablet by mouth daily.    Yes [provider]  nystatin (MYCOSTATIN/NYSTOP) powder Apply topically 3 (three) times daily. 02/28/19  Yes Eugenie Filler, MD  Omega-3 1000 MG CAPS Take 1,200 mg by mouth 2 (two) times daily.    Yes [provider]  polyethylene glycol (MIRALAX / GLYCOLAX) 17 g packet Take 17 g by mouth daily. 03/01/19  Yes Eugenie Filler, MD  potassium chloride 20 MEQ TBCR Take 20 mEq by mouth daily for 7 days. 02/28/19 03/07/19 Yes Eugenie Filler, MD  pravastatin (PRAVACHOL) 40 MG tablet Take 40 mg by mouth daily.  01/08/18  Yes [provider]  prochlorperazine (COMPAZINE) 10 MG tablet TAKE 1 TABLET BY MOUTH EVERY 6 HOURS AS NEEDED FOR NAUSEA OR VOMITING Patient taking differently: Take 10 mg by mouth every 6 (six) hours as needed for nausea or vomiting.  02/21/18  Yes Curt Bears, MD  senna-docusate (SENOKOT-S) 8.6-50 MG tablet Take 2 tablets by mouth 2 (two) times daily. 02/28/19  Yes Eugenie Filler, MD  traMADol (ULTRAM) 50 MG tablet Take 1 tablet (50 mg total) by mouth every 6 (six) hours as needed for severe pain. 06/23/18  Yes Curt Bears, MD  Insulin Pen Needle (FIFTY50 PEN NEEDLES) 31G X 8 MM MISC USE AS DIRECTED 06/30/15   [provider]  oxyCODONE-acetaminophen  (PERCOCET/ROXICET) 5-325 MG tablet Take 1 tablet by mouth every 4 (four) hours as needed for severe pain. Patient not taking: Reported on 03/06/2019 02/14/19   Marybelle Killings, MD    Physical Exam: Vitals:   03/06/19 2327 03/06/19 2330 03/06/19 2342 03/07/19 0015  BP: (!) 110/51 112/66 (!) 115/56 92/69  Pulse: 74 75 77 76  Resp: (!) 21 18 19  (!) 23  Temp: 97.7 F (36.5 C)  97.7 F (36.5 C)   TempSrc: Oral  Oral   SpO2: 98% 98% 100% 99%  Weight:      Height:        Physical Exam  Constitutional: He is oriented to person, place, and time. He appears well-developed and well-nourished. No distress.  HENT:  Head: Normocephalic.  Mouth/Throat: Oropharynx is clear and moist.  Eyes: Right eye exhibits no discharge. Left eye exhibits no discharge.  Neck: Neck supple.  Cardiovascular: Normal rate, regular rhythm and intact distal pulses.  Pulmonary/Chest: Effort normal. No respiratory distress. He has no wheezes.  Breathing comfortably on room air Decreased breath sounds at the right lung base  Abdominal: Soft. Bowel sounds are normal. He exhibits no distension. There is no abdominal tenderness. There is no rebound and no guarding.  Musculoskeletal:        General: Edema present.     Comments: +2 pitting edema of bilateral lower extremities  Neurological: He is alert and oriented to person, place, and time.  Skin: Skin is warm and dry. He is not diaphoretic.     Labs on Admission: I have personally reviewed following labs and imaging studies  CBC: Recent Labs  Lab 02/28/19 0318 03/06/19 2021  WBC 3.8* 5.9  NEUTROABS 2.2 4.9  HGB 8.1* 5.0*  HCT 25.5* 15.7*  MCV 90.4 92.9  PLT 196 366   Basic Metabolic Panel: Recent Labs  Lab 02/28/19 1159 03/06/19 2021  NA 133* 136  K 4.0 3.6  CL 94* 102  CO2 27 23  GLUCOSE 116* 154*  BUN 65* 98*  CREATININE 2.28* 2.22*  CALCIUM 9.3 8.8*   GFR: Estimated Creatinine Clearance: 39.9 mL/min (A) (by C-G formula based on SCr of 2.22  mg/dL (H)). Liver Function Tests: Recent Labs  Lab 03/06/19 2021  AST 46*  ALT 26  ALKPHOS 33*  BILITOT 1.2  PROT 6.1*  ALBUMIN 3.1*   No results for input(s): LIPASE, AMYLASE in the last 168 hours. No results for input(s): AMMONIA in the last 168 hours. Coagulation Profile: Recent Labs  Lab 03/06/19 2258  INR 1.3*   Cardiac Enzymes: No results for input(s): CKTOTAL, CKMB, CKMBINDEX, TROPONINI in the last 168 hours. BNP (last 3 results) No results for input(s): PROBNP in the last 8760 hours. HbA1C: No results for input(s): HGBA1C in the last 72 hours. CBG: Recent Labs  Lab 02/28/19 0741 02/28/19 1202  GLUCAP 111* 111*   Lipid Profile: No results for input(s): CHOL, HDL, LDLCALC, TRIG, CHOLHDL, LDLDIRECT in the last 72 hours. Thyroid Function Tests: No results for input(s): TSH, T4TOTAL, FREET4, T3FREE, THYROIDAB in the last 72 hours. Anemia Panel: No results for input(s): VITAMINB12, FOLATE, FERRITIN, TIBC, IRON, RETICCTPCT in the last 72 hours. Urine analysis:    Component Value Date/Time   COLORURINE YELLOW 03/06/2019 2216   APPEARANCEUR CLEAR 03/06/2019 2216   LABSPEC 1.013 03/06/2019 2216   PHURINE 5.0 03/06/2019 2216   GLUCOSEU NEGATIVE 03/06/2019 2216   HGBUR NEGATIVE 03/06/2019 2216   [place] 03/06/2019 2216   [place] 03/06/2019 2216   PROTEINUR NEGATIVE 03/06/2019 2216   NITRITE NEGATIVE 03/06/2019 2216   LEUKOCYTESUR MODERATE (A) 03/06/2019 2216    Radiological Exams on Admission: Dg Chest Portable 1 View  Addendum Date: 03/06/2019   ADDENDUM REPORT: 03/06/2019 20:49 ADDENDUM: These results were called by telephone at the time of interpretation on 03/06/2019 at 8:49 pm to Dr. Coral Ceo , who verbally acknowledged these results. Electronically Signed   By: Lovena Le M.D.   On: 03/06/2019 20:49   Result Date: 03/06/2019 CLINICAL DATA:  Shortness of breath, history of lung cancer, shortness of breath for 3 days upper and  lower extremity edema. EXAM: PORTABLE CHEST 1 VIEW COMPARISON:  Radiograph 01/31/2019 FINDINGS: Left subclavian approach Port-A-Cath tip terminates at the right atrium. Right chest tube is positioned in the right lung base. There is persistent consolidative opacity in the right perihilar region with a moderate right pleural effusion which layers posteriorly resulting in a gradient density of the right  hemithorax. The left lung remains largely clear. Cardiomediastinal contours are unchanged from prior. There is a new, comminuted fracture of the proximal left humerus which appears to extend through the surgical and anatomic necks involving the greater tuberosity. Inferior positioning of the humeral head relative to the left glenoid may reflect a left joint effusion. IMPRESSION: Redemonstrated consolidative opacity in the right mid lung with moderate right pleural effusion despite a right pleural drain in place. Comminuted fracture of the proximal left humerus. Inferior subluxation of the humeral head may reflect joint effusion. Electronically Signed: By: Lovena Le M.D. On: 03/06/2019 20:44    EKG: Independently reviewed.  Sinus rhythm, T wave abnormality in lateral leads similar to prior tracing from February 28, 2019.  Assessment/Plan Principal Problem:   Dyspnea Active Problems:   DM2 (diabetes mellitus, type 2) (HCC)   HYPERTENSION, BENIGN   CKD (chronic kidney disease) stage 3, GFR 30-59 ml/min (HCC)   Anemia   Dyspnea Likely multifactorial given acute on chronic anemia, known lung cancer with right-sided Pleurx catheter for recurrent pleural effusion/current catheter malfunction, and mild acute exacerbation of chronic diastolic congestive heart failure. Not hypoxic and satting well on room air. Chest x-ray showing consolidative opacity in the right midlung with moderate right pleural effusion despite a right pleural drain in place.  Pneumonia less likely given no fever or leukocytosis.  BNP 334,  improved since labs done 12 days ago.  Echo done February 23, 2019 showing normal systolic function with EF 60 to 41% and diastolic function could not be assessed. Significant drop in hemoglobin from labs done 6 days ago.  EKG without acute ischemic changes. -Management of anemia as mentioned below -Consult IR in a.m. to discuss Pleurx catheter malfunction -IV Lasix 40 mg ordered in the ED but not administered yet. Blood pressure slightly soft and concern for upper GI bleed.  Order for Lasix has been canceled and nursing staff has been informed. -Low suspicion for pneumonia.  Check procalcitonin level, start antibiotics if elevated.  COVID-19 test pending. -Continuous pulse ox, supplemental oxygen if needed  Acute on chronic symptomatic anemia secondary to suspected upper GI bleed Patient is currently taking Advil for pain secondary to recent left proximal humerus fracture.  Hemoglobin 5.0 which is a 3 g drop from labs done 6 days ago.  Stool dark on exam, FOBT positive.  INR 1.3.  Blood pressure slightly soft but not tachycardic. -Type and screen -Has signs of volume overload but will hold off Lasix at this time.  Give IV fluid if blood pressure low, goal MAP >65.  -Received IV Protonix 40 mg in the ED.  Continue IV Protonix 40 mg every 12 hours. -2 units PRBCs ordered in the ED -Posttransfusion H&H, give additional unit if hemoglobin less than 7 -Consult GI in a.m. for EGD -Keep n.p.o.  Abnormal urinalysis UA with moderate amount of leukocytes, 0-5 WBCs, rare bacteria, and negative nitrite.  Afebrile and no leukocytosis.  Patient is not endorsing any UTI symptoms. -Urine culture pending  CKD stage III -Creatinine 2.2, stable since labs done 6 days ago. 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 [name] from oncology and currently on immunotherapy. Has recurrent pleural effusion with right-sided Pleurx catheter. -IR consultation  in a.m. to discuss catheter malfunction as mentioned above -Oncology follow-up  Well-controlled insulin-dependent diabetes mellitus -A1c 5.8 on 8/14.  Sliding scale insulin sensitive every 4 hours as patient is currently n.p.o. CBG checks.  Hypertension -Hold off home antihypertensives at this time given soft blood pressure and concern for acute upper GI bleed.  DVT prophylaxis: SCDs Code Status: Patient wishes to be full code. Family Communication: No family available at this time. Disposition Plan: Anticipate discharge after clinical improvement. Consults called: None Admission status: It is my clinical opinion that referral for OBSERVATION is reasonable and necessary in this patient based on the above information provided. The aforementioned taken together are felt to place the patient at high risk for further clinical deterioration. However it is anticipated that the patient may be medically stable for discharge from the hospital within 24 to 48 hours.  The medical decision making on this patient was of high complexity and the patient is at high risk for clinical deterioration, therefore this is a level 3 visit.  Shela Leff MD [facility] Pager [phone]  If 7PM-7AM, please contact night-coverage [email or web address] Password [facility]  03/07/2019, 12:55 AM

## 2019-03-06 NOTE — ED Notes (Signed)
Pt reminded a urine specimen is needed 

## 2019-03-06 NOTE — ED Triage Notes (Signed)
Patient is from home and transported via [facility]. Patient has a history of lung cancer and complaining of shortness of breath for the last 3 days. Also, patient has edema in upper and lower extremities with his upper extremities being usual swollen, sleepy, and pale. He is positive for left shoulder fracture 3 weeks ago.

## 2019-03-06 NOTE — ED Notes (Signed)
Patient signed blood consent and this writer witnessed the consent.

## 2019-03-06 NOTE — Telephone Encounter (Signed)
Call from [facility] home health, wife called with concerns pt having Shortness of breath, difficulty ambulating, refusing to go to ED for further evaluation. Called pt, spoke with wife. Pt sats 93-97% at rest, with movement pt unable to breath after 5 paces. Recent diagnosis of CHF Referral to Cariology 9/23 to establish care  with last admission, taking 80mg  lasix day. Pt having urgency and incontinence due to difficulty ambulating and getting to bathroom quick enough. Pt has an appointment with Dr. Valora Piccolo- PCP 8/26, wife concerned about pt getting to car without falling. Reviewed with MD, returned call to to [place], [place] verbal order to have RN go out to check on status of pt today and further evaluation. Notified wife of above, discussed importance of pt's CHF/fluid buildup and breathing being further evaluated. Wife verbalized understanding.

## 2019-03-06 NOTE — ED Notes (Signed)
[place], PA informed to this Probation officer to give the Lasix in between the two units.

## 2019-03-06 NOTE — ED Notes (Signed)
Pt given urinal and attempting to provide specimen

## 2019-03-06 NOTE — ED Provider Notes (Signed)
[facility] DEPT Provider Note   CSN: [id number] Arrival date & time: 03/06/19  1906     History   Chief Complaint Chief Complaint  Patient presents with  . Shortness of Breath    HPI Darrell [name] is a 73 y.o. male.     HPI   Patient is a 73 year old male with a history of aortic atherosclerosis, lung cancer, CKD, diabetes, hyperlipidemia, hypertension, thoracic aortic aneurysm without rupture, who presents to the emergency department today for evaluation of shortness of breath.  States for the last 3 days he has had shortness of breath on exertion.  Denies any shortness of breath at rest.  Denies any chest pain or pain with inspiration.  He has not had a cough or fevers.  He does report bilateral lower extremity edema that he feels may be improving.  He is also had bilateral upper extremity edema which is improving somewhat.  He has had issues with hypervolemia over the last month and was admitted for it earlier this month.   He is on 40 of Lasix twice daily which he has been compliant with.  He denies any abdominal pain, nausea, vomiting, diarrhea, hematochezia or melena. He does state he has had some dark stools.  Past Medical History:  Diagnosis Date  . Aortic atherosclerosis ([place]) 07/18/2018  . Cancer ([place])   . Cellulitis of right lower extremity   . Chronic kidney disease   . Diabetes mellitus without complication ([place])   . Dyslipidemia   . Hypertension   . Thoracic aortic aneurysm without rupture ([place]) 07/18/2018    Patient Active Problem List   Diagnosis Date Noted  . Anemia 03/07/2019  . Pain and swelling of right upper extremity   . Acute diastolic CHF (congestive heart failure) ([place]) 02/22/2019  . Closed fracture of left proximal humerus 02/02/2019  . Hypokalemia 09/18/2018  . Dyspnea 08/29/2018  . COPD (chronic obstructive pulmonary disease) ([place]) 08/03/2018  . Hemoptysis 07/18/2018  . CKD (chronic kidney disease) stage 3, GFR  30-59 ml/min (H[facility]) 07/18/2018  . Lobar pneumonia ([place]) 07/18/2018  . Thoracic aortic aneurysm without rupture ([place]) 07/18/2018  . Aortic atherosclerosis ([place]) 07/18/2018  . Encounter for antineoplastic immunotherapy 05/17/2018  . Port-A-Cath in place 03/27/2018  . Encounter for antineoplastic chemotherapy 02/16/2018  . 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 Goals of care, counseling/discussion 02/07/2018  . ANGIOKERATOMA, BLEEDING 10/06/2006  . DM2 (diabetes mellitus, type 2) ([place]) 10/06/2006  . HYPERLIPIDEMIA 10/06/2006  . HYPERTENSION, BENIGN 10/06/2006  . LATERAL MENISCUS TEAR, RIGHT 10/06/2006  . CHOLECYSTECTOMY, HX OF 10/06/2006    Past Surgical History:  Procedure Laterality Date  . IR GUIDED DRAIN W CATHETER PLACEMENT  11/15/2018  . IR GUIDED DRAIN W CATHETER PLACEMENT  01/31/2019  . IR INSTILL VIA CHEST TUBE AGENT FOR FIBRINOLYSIS INI DAY  01/23/2019  . IR REMOVAL OF PLURAL CATH W/CUFF  01/31/2019  . IR THORACENTESIS ASP PLEURAL SPACE W/IMG GUIDE  09/05/2018  . IR THORACENTESIS ASP PLEURAL SPACE W/IMG GUIDE  10/11/2018  . IR THORACENTESIS ASP PLEURAL SPACE W/IMG GUIDE  10/30/2018  . PORTACATH PLACEMENT Left 02/21/2018   Procedure: INSERTION PORT-A-CATH;  Surgeon: Grace Isaac, MD;  Location: [facility] OR;  Service: Thoracic;  Laterality: Left;        Home Medications    Prior to Admission medications   Medication Sig Start Date End Date Taking? Authorizing Provider  albuterol (PROVENTIL HFA;VENTOLIN HFA) 108 (90 Base) MCG/ACT inhaler Inhale 2 puffs into  the lungs every 4 (four) hours as needed for wheezing or shortness of breath. 08/03/18  Yes Byrum, Rose Fillers, MD  carvedilol (COREG) 6.25 MG tablet Take 6.25 mg by mouth 2 (two) times daily with a meal.  07/19/16  Yes [provider]  cloNIDine (CATAPRES) 0.1 MG tablet Take 1 tablet (0.1 mg total) by mouth daily. 02/28/19  Yes Eugenie Filler, MD  fenofibrate 160 MG tablet Take 160 mg by mouth daily.  11/13/17  Yes  [provider]  furosemide (LASIX) 40 MG tablet Take 1 tablet (40 mg total) by mouth 2 (two) times daily. 02/28/19  Yes Eugenie Filler, MD  Insulin Detemir (LEVEMIR FLEXTOUCH) 100 UNIT/ML Pen Inject 10 Units into the skin daily.  07/18/15  Yes [provider]  lidocaine-prilocaine (EMLA) cream Apply 1 application topically as needed. Patient taking differently: Apply 1 application topically as needed (port).  02/16/18  Yes Curt Bears, MD  Multiple Vitamin (MULTI-VITAMINS) TABS Take 1 tablet by mouth daily.    Yes [provider]  nystatin (MYCOSTATIN/NYSTOP) powder Apply topically 3 (three) times daily. 02/28/19  Yes Eugenie Filler, MD  Omega-3 1000 MG CAPS Take 1,200 mg by mouth 2 (two) times daily.    Yes [provider]  polyethylene glycol (MIRALAX / GLYCOLAX) 17 g packet Take 17 g by mouth daily. 03/01/19  Yes Eugenie Filler, MD  potassium chloride 20 MEQ TBCR Take 20 mEq by mouth daily for 7 days. 02/28/19 03/07/19 Yes Eugenie Filler, MD  pravastatin (PRAVACHOL) 40 MG tablet Take 40 mg by mouth daily.  01/08/18  Yes [provider]  prochlorperazine (COMPAZINE) 10 MG tablet TAKE 1 TABLET BY MOUTH EVERY 6 HOURS AS NEEDED FOR NAUSEA OR VOMITING Patient taking differently: Take 10 mg by mouth every 6 (six) hours as needed for nausea or vomiting.  02/21/18  Yes Curt Bears, MD  senna-docusate (SENOKOT-S) 8.6-50 MG tablet Take 2 tablets by mouth 2 (two) times daily. 02/28/19  Yes Eugenie Filler, MD  traMADol (ULTRAM) 50 MG tablet Take 1 tablet (50 mg total) by mouth every 6 (six) hours as needed for severe pain. 06/23/18  Yes Curt Bears, MD  Insulin Pen Needle (FIFTY50 PEN NEEDLES) 31G X 8 MM MISC USE AS DIRECTED 06/30/15   [provider]  oxyCODONE-acetaminophen (PERCOCET/ROXICET) 5-325 MG tablet Take 1 tablet by mouth every 4 (four) hours as needed for severe pain. Patient not taking: Reported on 03/06/2019 02/14/19    Marybelle Killings, MD    Family History Family History  Problem Relation Age of Onset  . Breast cancer Mother   . CAD Father     Social History Social History   Tobacco Use  . Smoking status: Former Smoker    Packs/day: 2.00    Years: 35.00    Pack years: 70.00    Types: Cigarettes    Quit date: 07/12/1996    Years since quitting: 22.6  . Smokeless tobacco: Never Used  Substance Use Topics  . Alcohol use: Yes    Comment: occasionally  . Drug use: Never     Allergies   Percocet [oxycodone-acetaminophen] and Lisinopril   Review of Systems Review of Systems  Constitutional: Negative for fever.  HENT: Negative for ear pain and sore throat.   Eyes: Negative for visual disturbance.  Respiratory: Positive for shortness of breath. Negative for cough.   Cardiovascular: Positive for leg swelling. Negative for chest pain.  Gastrointestinal: Negative for abdominal pain, constipation, diarrhea,  nausea and vomiting.  Genitourinary: Negative for dysuria and hematuria.  Musculoskeletal: Negative for back pain.  Skin: Negative for rash.  Neurological: Negative for headaches.  All other systems reviewed and are negative.   Physical Exam Updated Vital Signs BP (!) 115/56   Pulse 77   Temp 97.7 F (36.5 C) (Oral)   Resp 19   Ht 5\' 9"  (1.753 m)   Wt 121.6 kg   SpO2 100%   BMI 39.58 kg/m   Physical Exam Vitals signs and nursing note reviewed.  Constitutional:      Appearance: He is well-developed.     Comments: Appears pale  HENT:     Head: Normocephalic and atraumatic.  Eyes:     Conjunctiva/sclera: Conjunctivae normal.     Comments: Pale conjunctiva  Neck:     Musculoskeletal: Neck supple.  Cardiovascular:     Rate and Rhythm: Normal rate and regular rhythm.     Heart sounds: No murmur.  Pulmonary:     Effort: Tachypnea present. No respiratory distress.     Breath sounds: Examination of the right-middle field reveals rales. Examination of the right-lower field  reveals rales. Rales present.     Comments: Speaking in short sentences Abdominal:     General: Bowel sounds are normal.     Palpations: Abdomen is soft.     Tenderness: There is no abdominal tenderness. There is no guarding or rebound.  Genitourinary:    Comments: DRE performed with chaperone present. Dark stool noted. No bright red blood. Musculoskeletal:     Right lower leg: Edema present.     Left lower leg: Edema present.     Comments: LUE edema   Skin:    General: Skin is warm and dry.  Neurological:     Mental Status: He is alert.     ED Treatments / Results  Labs (all labs ordered are listed, but only abnormal results are displayed) Labs Reviewed  CBC WITH DIFFERENTIAL/PLATELET - Abnormal; Notable for the following components:      Result Value   RBC 1.69 (*)    Hemoglobin 5.0 (*)    HCT 15.7 (*)    RDW 19.4 (*)    nRBC 1.5 (*)    Lymphs Abs 0.3 (*)    All other components within normal limits  COMPREHENSIVE METABOLIC PANEL - Abnormal; Notable for the following components:   Glucose, Bld 154 (*)    BUN 98 (*)    Creatinine, Ser 2.22 (*)    Calcium 8.8 (*)    Total Protein 6.1 (*)    Albumin 3.1 (*)    AST 46 (*)    Alkaline Phosphatase 33 (*)    GFR calc non Af Amer 29 (*)    GFR calc Af Amer 34 (*)    All other components within normal limits  URINALYSIS, ROUTINE W REFLEX MICROSCOPIC - Abnormal; Notable for the following components:   Leukocytes,Ua MODERATE (*)    Bacteria, UA RARE (*)    All other components within normal limits  BRAIN NATRIURETIC PEPTIDE - Abnormal; Notable for the following components:   B Natriuretic Peptide 334.3 (*)    All other components within normal limits  PROTIME-INR - Abnormal; Notable for the following components:   Prothrombin Time 16.3 (*)    INR 1.3 (*)    All other components within normal limits  POC OCCULT BLOOD, ED - Abnormal; Notable for the following components:   Fecal Occult Bld POSITIVE (*)  All other  components within normal limits  SARS CORONAVIRUS 2 (TAT 6-12 HRS)  URINE CULTURE  TYPE AND SCREEN  PREPARE RBC (CROSSMATCH)    EKG EKG Interpretation  Date/Time:  Tuesday March 06 2019 19:36:40 EDT Ventricular Rate:  77 PR Interval:    QRS Duration: 113 QT Interval:  382 QTC Calculation: 433 R Axis:   13 Text Interpretation:  Sinus rhythm Short PR interval Incomplete left bundle branch block No acute changes No significant change since last tracing Confirmed by Varney Biles [phone]) on 03/06/2019 11:11:02 PM   Radiology Dg Chest Portable 1 View  Addendum Date: 03/06/2019   ADDENDUM REPORT: 03/06/2019 20:49 ADDENDUM: These results were called by telephone at the time of interpretation on 03/06/2019 at 8:49 pm to Dr. Coral Ceo , who verbally acknowledged these results. Electronically Signed   By: Lovena Le M.D.   On: 03/06/2019 20:49   Result Date: 03/06/2019 CLINICAL DATA:  Shortness of breath, history of lung cancer, shortness of breath for 3 days upper and lower extremity edema. EXAM: PORTABLE CHEST 1 VIEW COMPARISON:  Radiograph 01/31/2019 FINDINGS: Left subclavian approach Port-A-Cath tip terminates at the right atrium. Right chest tube is positioned in the right lung base. There is persistent consolidative opacity in the right perihilar region with a moderate right pleural effusion which layers posteriorly resulting in a gradient density of the right hemithorax. The left lung remains largely clear. Cardiomediastinal contours are unchanged from prior. There is a new, comminuted fracture of the proximal left humerus which appears to extend through the surgical and anatomic necks involving the greater tuberosity. Inferior positioning of the humeral head relative to the left glenoid may reflect a left joint effusion. IMPRESSION: Redemonstrated consolidative opacity in the right mid lung with moderate right pleural effusion despite a right pleural drain in place. Comminuted fracture  of the proximal left humerus. Inferior subluxation of the humeral head may reflect joint effusion. Electronically Signed: By: Lovena Le M.D. On: 03/06/2019 20:44    Procedures Procedures (including critical care time) CRITICAL CARE Performed by: Rodney Booze   Total critical care time: 35 minutes  Critical care time was exclusive of separately billable procedures and treating other patients.  Critical care was necessary to treat or prevent imminent or life-threatening deterioration.  Critical care was time spent personally by me on the following activities: development of treatment plan with patient and/or surrogate as well as nursing, discussions with consultants, evaluation of patient's response to treatment, examination of patient, obtaining history from patient or surrogate, ordering and performing treatments and interventions, ordering and review of laboratory studies, ordering and review of radiographic studies, pulse oximetry and re-evaluation of patient's condition.   Medications Ordered in ED Medications  0.9 %  sodium chloride infusion (Manually program via Guardrails IV Fluids) (has no administration in time range)  furosemide (LASIX) injection 40 mg (has no administration in time range)  pantoprazole (PROTONIX) injection 40 mg (40 mg Intravenous Given 03/06/19 2311)     Initial Impression / Assessment and Plan / ED Course  I have reviewed the triage vital signs and the nursing notes.  Pertinent labs & imaging results that were available during my care of the patient were reviewed by me and considered in my medical decision making (see chart for details).     Final Clinical Impressions(s) / ED Diagnoses   Final diagnoses:  Symptomatic anemia   Pt is a 73 y/o male with a h/o lung CA presenting to the ED for  eval of SOB with exertion for the last 3 days. Also with hypervolemia which he states is somewhat improved from prior. Has been compliant with Lasix.   BP  stable.  No tachycardia.  Afebrile.  Patient appears pale.  Evidence of fluid overload on exam.  Does have rales in the right lower lobe and right middle lobe.  CBC with anemia, hemoglobin of 5.  Decreased from a week ago when it was 8.  No leukocytosis CMP with elevated BUN/creatinine.  BUN is 98.  Creatinine 2.22. AST slightly elevated at 46.  Bilirubin is normal. BNP elevated at 334.  Improved from prior. UA with moderate leuks, no nitrites.  0-5 RBCs.  0-5 WBCs.  Rare bacteria. Hemoccult is positive.  Stool was dark on exam.  Question upper GI source given recent history of using ibuprofen for his left shoulder injury.  Chest x-ray with consolidative opacity in the right midlung with moderate right pleural effusion.  Comminuted fracture of the proximal left humerus noted from prior.  EKG with NSR, Short PR, interval Incomplete left bundle branch block, No acute changes, No significant change since last tracing  Patient with symptomatic anemia.  Suspect shortness of breath is secondary to this but may also be multifactorial in the setting of known lung cancer with right pleural effusion.  Less likely ACS without chest pain.  Patient given 2 units PRBCs.  Also given Lasix for hypervolemia.  Also given IV Protonix given concern for upper GI source of bleeding.  CONSULT with Dr. Marlowe Sax who hospitalist service who accepts pt for admission.   Case discussed with Dr. Kathrynn Humble who is in agreement with w/u and plan.   ED Discharge Orders    None       Bishop Dublin 03/07/19 0014    Varney Biles, MD 03/13/19 [phone]

## 2019-03-06 NOTE — ED Notes (Signed)
Pt placed on bedside toliet

## 2019-03-06 NOTE — Telephone Encounter (Signed)
"  Shawnda pt. care mgr., [facility] [phone]) calling for Nursing orders for Darrell [name].  He is more short of breath than normal when he gets up.  Does not want to go to the ED.  With nursing orders someone could put eyes on him beyond PT."

## 2019-03-06 NOTE — ED Notes (Signed)
Patient is aware a urine sample is needed.

## 2019-03-07 ENCOUNTER — Other Ambulatory Visit: Payer: Self-pay

## 2019-03-07 DIAGNOSIS — I700 Atherosclerosis of aorta: Secondary | ICD-10-CM | POA: Diagnosis present

## 2019-03-07 DIAGNOSIS — K922 Gastrointestinal hemorrhage, unspecified: Secondary | ICD-10-CM | POA: Diagnosis present

## 2019-03-07 DIAGNOSIS — G9341 Metabolic encephalopathy: Secondary | ICD-10-CM | POA: Diagnosis present

## 2019-03-07 DIAGNOSIS — I82402 Acute embolism and thrombosis of unspecified deep veins of left lower extremity: Secondary | ICD-10-CM | POA: Diagnosis not present

## 2019-03-07 DIAGNOSIS — R609 Edema, unspecified: Secondary | ICD-10-CM | POA: Diagnosis not present

## 2019-03-07 DIAGNOSIS — R8290 Unspecified abnormal findings in urine: Secondary | ICD-10-CM | POA: Diagnosis present

## 2019-03-07 DIAGNOSIS — Z79891 Long term (current) use of opiate analgesic: Secondary | ICD-10-CM | POA: Diagnosis not present

## 2019-03-07 DIAGNOSIS — D62 Acute posthemorrhagic anemia: Secondary | ICD-10-CM | POA: Diagnosis present

## 2019-03-07 DIAGNOSIS — Z978 Presence of other specified devices: Secondary | ICD-10-CM | POA: Diagnosis not present

## 2019-03-07 DIAGNOSIS — N183 Chronic kidney disease, stage 3 (moderate): Secondary | ICD-10-CM | POA: Diagnosis present

## 2019-03-07 DIAGNOSIS — C3491 Malignant neoplasm of unspecified part of right bronchus or lung: Secondary | ICD-10-CM | POA: Diagnosis present

## 2019-03-07 DIAGNOSIS — R0602 Shortness of breath: Secondary | ICD-10-CM | POA: Diagnosis not present

## 2019-03-07 DIAGNOSIS — E1122 Type 2 diabetes mellitus with diabetic chronic kidney disease: Secondary | ICD-10-CM | POA: Diagnosis present

## 2019-03-07 DIAGNOSIS — N179 Acute kidney failure, unspecified: Secondary | ICD-10-CM | POA: Diagnosis present

## 2019-03-07 DIAGNOSIS — D649 Anemia, unspecified: Secondary | ICD-10-CM

## 2019-03-07 DIAGNOSIS — I130 Hypertensive heart and chronic kidney disease with heart failure and stage 1 through stage 4 chronic kidney disease, or unspecified chronic kidney disease: Secondary | ICD-10-CM | POA: Diagnosis present

## 2019-03-07 DIAGNOSIS — I712 Thoracic aortic aneurysm, without rupture: Secondary | ICD-10-CM | POA: Diagnosis present

## 2019-03-07 DIAGNOSIS — I82622 Acute embolism and thrombosis of deep veins of left upper extremity: Secondary | ICD-10-CM | POA: Diagnosis present

## 2019-03-07 DIAGNOSIS — I5033 Acute on chronic diastolic (congestive) heart failure: Secondary | ICD-10-CM | POA: Diagnosis present

## 2019-03-07 DIAGNOSIS — Z20828 Contact with and (suspected) exposure to other viral communicable diseases: Secondary | ICD-10-CM | POA: Diagnosis present

## 2019-03-07 DIAGNOSIS — I480 Paroxysmal atrial fibrillation: Secondary | ICD-10-CM | POA: Diagnosis present

## 2019-03-07 DIAGNOSIS — K921 Melena: Secondary | ICD-10-CM | POA: Diagnosis present

## 2019-03-07 DIAGNOSIS — E785 Hyperlipidemia, unspecified: Secondary | ICD-10-CM | POA: Diagnosis present

## 2019-03-07 DIAGNOSIS — Z87891 Personal history of nicotine dependence: Secondary | ICD-10-CM | POA: Diagnosis not present

## 2019-03-07 DIAGNOSIS — Z6841 Body Mass Index (BMI) 40.0 and over, adult: Secondary | ICD-10-CM | POA: Diagnosis not present

## 2019-03-07 DIAGNOSIS — Z794 Long term (current) use of insulin: Secondary | ICD-10-CM | POA: Diagnosis not present

## 2019-03-07 DIAGNOSIS — Z923 Personal history of irradiation: Secondary | ICD-10-CM | POA: Diagnosis not present

## 2019-03-07 LAB — CBC
HCT: 20.8 % — ABNORMAL LOW (ref 39.0–52.0)
HCT: 24 % — ABNORMAL LOW (ref 39.0–52.0)
Hemoglobin: 6.9 g/dL — CL (ref 13.0–17.0)
Hemoglobin: 7.7 g/dL — ABNORMAL LOW (ref 13.0–17.0)
MCH: 29.9 pg (ref 26.0–34.0)
MCH: 28.8 pg (ref 26.0–34.0)
MCHC: 33.2 g/dL (ref 30.0–36.0)
MCHC: 32.1 g/dL (ref 30.0–36.0)
MCV: 90 fL (ref 80.0–100.0)
MCV: 89.9 fL (ref 80.0–100.0)
Platelets: 192 10*3/uL (ref 150–400)
Platelets: 202 10*3/uL (ref 150–400)
RBC: 2.31 MIL/uL — ABNORMAL LOW (ref 4.22–5.81)
RBC: 2.67 MIL/uL — ABNORMAL LOW (ref 4.22–5.81)
RDW: 17.8 % — ABNORMAL HIGH (ref 11.5–15.5)
RDW: 18.2 % — ABNORMAL HIGH (ref 11.5–15.5)
WBC: 4.7 10*3/uL (ref 4.0–10.5)
WBC: 4.2 10*3/uL (ref 4.0–10.5)
nRBC: 1.9 % — ABNORMAL HIGH (ref 0.0–0.2)
nRBC: 2.1 % — ABNORMAL HIGH (ref 0.0–0.2)

## 2019-03-07 LAB — HEMOGLOBIN AND HEMATOCRIT, BLOOD
HCT: 21 % — ABNORMAL LOW (ref 39.0–52.0)
Hemoglobin: 7 g/dL — ABNORMAL LOW (ref 13.0–17.0)

## 2019-03-07 LAB — BASIC METABOLIC PANEL
Anion gap: 7 (ref 5–15)
BUN: 93 mg/dL — ABNORMAL HIGH (ref 8–23)
CO2: 26 mmol/L (ref 22–32)
Calcium: 8.8 mg/dL — ABNORMAL LOW (ref 8.9–10.3)
Chloride: 104 mmol/L (ref 98–111)
Creatinine, Ser: 2.19 mg/dL — ABNORMAL HIGH (ref 0.61–1.24)
GFR calc Af Amer: 34 mL/min — ABNORMAL LOW (ref >60)
GFR calc non Af Amer: 29 mL/min — ABNORMAL LOW (ref >60)
Glucose, Bld: 123 mg/dL — ABNORMAL HIGH (ref 70–99)
Potassium: 3.5 mmol/L (ref 3.5–5.1)
Sodium: 137 mmol/L (ref 135–145)

## 2019-03-07 LAB — PROCALCITONIN: Procalcitonin: <0.1 ng/mL

## 2019-03-07 LAB — PREPARE RBC (CROSSMATCH)

## 2019-03-07 LAB — GLUCOSE, CAPILLARY
Glucose-Capillary: 122 mg/dL — ABNORMAL HIGH (ref 70–99)
Glucose-Capillary: 114 mg/dL — ABNORMAL HIGH (ref 70–99)
Glucose-Capillary: 103 mg/dL — ABNORMAL HIGH (ref 70–99)

## 2019-03-07 LAB — CBG MONITORING, ED
Glucose-Capillary: 145 mg/dL — ABNORMAL HIGH (ref 70–99)
Glucose-Capillary: 130 mg/dL — ABNORMAL HIGH (ref 70–99)
Glucose-Capillary: 115 mg/dL — ABNORMAL HIGH (ref 70–99)

## 2019-03-07 LAB — SARS CORONAVIRUS 2 (TAT 6-24 HRS): SARS Coronavirus 2: NEGATIVE

## 2019-03-07 MED ORDER — SODIUM CHLORIDE 0.9% IV SOLUTION
Freq: Once | INTRAVENOUS | Status: AC
  Administered 2019-03-07: 14:00:00 via INTRAVENOUS

## 2019-03-07 MED ORDER — FUROSEMIDE 10 MG/ML IJ SOLN
40.0000 mg | Freq: Two times a day (BID) | INTRAMUSCULAR | Status: DC
  Administered 2019-03-08: 40 mg via INTRAVENOUS
  Filled 2019-03-07: qty 4

## 2019-03-07 MED ORDER — SODIUM CHLORIDE 0.9 % IV SOLN: 500.0000 mg | Freq: Every day | INTRAVENOUS | Status: DC

## 2019-03-07 MED ORDER — SODIUM CHLORIDE 0.9% FLUSH: 10.0000 mL | INTRAVENOUS | Status: DC | PRN

## 2019-03-07 MED ORDER — HYDROXYZINE HCL 25 MG PO TABS
25.0000 mg | ORAL_TABLET | Freq: Three times a day (TID) | ORAL | Status: DC | PRN
  Administered 2019-03-07 – 2019-03-09 (×6): 25 mg via ORAL
  Filled 2019-03-07 (×6): qty 1

## 2019-03-07 MED ORDER — MORPHINE SULFATE (PF) 2 MG/ML IV SOLN
2.0000 mg | INTRAVENOUS | Status: DC | PRN
  Administered 2019-03-07 – 2019-03-08 (×6): 2 mg via INTRAVENOUS
  Filled 2019-03-07 (×6): qty 1

## 2019-03-07 MED ORDER — FUROSEMIDE 10 MG/ML IJ SOLN
40.0000 mg | Freq: Two times a day (BID) | INTRAMUSCULAR | Status: DC
  Administered 2019-03-07: 13:00:00 40 mg via INTRAVENOUS
  Filled 2019-03-07: qty 4

## 2019-03-07 MED ORDER — SODIUM CHLORIDE 0.9% FLUSH
10.0000 mL | Freq: Two times a day (BID) | INTRAVENOUS | Status: DC
  Administered 2019-03-07 – 2019-03-09 (×2): 10 mL

## 2019-03-07 MED ORDER — INSULIN ASPART 100 UNIT/ML ~~LOC~~ SOLN
0.0000 [IU] | SUBCUTANEOUS | Status: DC
  Administered 2019-03-07 (×3): 1 [IU] via SUBCUTANEOUS
  Filled 2019-03-07: qty 0.09

## 2019-03-07 MED ORDER — FUROSEMIDE 10 MG/ML IJ SOLN
20.0000 mg | Freq: Once | INTRAMUSCULAR | Status: AC
  Administered 2019-03-07: 17:00:00 20 mg via INTRAVENOUS
  Filled 2019-03-07: qty 2

## 2019-03-07 MED ORDER — PANTOPRAZOLE SODIUM 40 MG IV SOLR
40.0000 mg | Freq: Two times a day (BID) | INTRAVENOUS | Status: DC
  Administered 2019-03-07 – 2019-03-08 (×3): 40 mg via INTRAVENOUS
  Filled 2019-03-07 (×3): qty 40

## 2019-03-07 MED ORDER — TRAMADOL HCL 50 MG PO TABS
50.0000 mg | ORAL_TABLET | Freq: Four times a day (QID) | ORAL | Status: DC | PRN
  Administered 2019-03-07: 08:00:00 50 mg via ORAL
  Filled 2019-03-07: qty 1

## 2019-03-07 MED ORDER — SODIUM CHLORIDE 0.9 % IV SOLN: 1.0000 g | Freq: Every day | INTRAVENOUS | Status: DC

## 2019-03-07 MED ORDER — ALBUTEROL SULFATE (2.5 MG/3ML) 0.083% IN NEBU
2.5000 mg | INHALATION_SOLUTION | Freq: Four times a day (QID) | RESPIRATORY_TRACT | Status: DC | PRN
  Administered 2019-03-07 – 2019-03-10 (×3): 2.5 mg via RESPIRATORY_TRACT
  Filled 2019-03-07 (×3): qty 3

## 2019-03-07 NOTE — Progress Notes (Signed)
[facility] Progress Note  Patient: Darrell [name] KXF:[id number]   PCP: [name], MD DOB: 1945/07/17   DOA: 03/06/2019   DOS: 03/07/2019   Date of Service: the patient was seen and examined on 03/07/2019  Brief hospital course: Pt. 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 [name] from oncology and currently on immunotherapy, recurrent pleural effusion with right-sided Pleurx catheter, CKD stage III, type 2 diabetes, hypertension, hyperlipidemia, thoracic aortic aneurysm, recent left proximal humerus fracture; admitted on 03/06/2019, presented with complaint of shortness of breath, was found to have symptomatic anemia as well as recurrent pleural effusion and acute on chronic CHF Currently further plan is continue IV diuresis and transfusion.  Further work-up with GI.Marland Kitchen  Subjective: Improvement in shortness of breath but still not back to baseline.  No chest pain no abdominal pain.  No nausea no vomiting.  Had another episode of black BM.  Assessment and Plan: Symptomatic anemia. Baseline hemoglobin 8.8. Presented with hemoglobin of 5.0. After 2 PRBC hemoglobin came up to 7.0. We will transfuse 1 more PRBC. Monitor on telemetry. Provide IV Lasix. Continue Protonix every 12 hours. N.p.o. for now since the patient continues to appear to have some.  Gastroenterology consulted.  Acute on chronic diastolic CHF. BNP 334, improved since labs done 12 days ago.  Echo done February 23, 2019 showing normal systolic function with EF 60 to 69% and diastolic function could not be assessed. EKG without acute ischemic changes. -Management of anemia as mentioned below -IV Lasix 40 mg  twice daily. -Low suspicion for pneumonia.  Check procalcitonin level, start antibiotics if elevated.  COVID-19 test pending. -Continuous pulse ox, supplemental oxygen if needed  Recurrent right pleural effusion. S/P pleural catheter placement IR consulted since there is  more fluid collection. IR feels that the fluid collection is stable.  We will repeat another chest x-ray tomorrow.  Appreciate their assistance.  Abnormal urinalysis UA with moderate amount of leukocytes, 0-5 WBCs, rare bacteria, and negative nitrite.  Afebrile and no leukocytosis.  Patient is not endorsing any UTI symptoms. -Urine culture pending  CKD stage III -Creatinine 2.2, stable since labs done 6 days ago. 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 [name] from oncology and currently on immunotherapy. Has recurrent pleural effusion with right-sided Pleurx catheter. -Oncology follow-up  Well-controlled insulin-dependent diabetes mellitus -A1c 5.8 on 8/14.  Sliding scale insulin sensitive every 4 hours as patient is currently n.p.o. CBG checks.  Hypertension -Hold off home antihypertensives at this time given soft blood pressure and concern for acute upper GI bleed.  Body mass index is 40.29 kg/m.   Diet: NPO except medications for active GI bleed DVT Prophylaxis: SCD, pharmacological prophylaxis contraindicated due to Active GI bleed  Advance goals of care discussion: Full code  Family Communication: family was present at bedside, at the time of interview. The pt provided permission to discuss medical plan with the family. Opportunity was given to ask question and all questions were answered satisfactorily.   Disposition:  Discharge to Home .  Consultants: gastroenterology [facility] Procedures: none  Scheduled Meds: . [START ON 03/08/2019] furosemide  40 mg Intravenous BID  . insulin aspart  0-9 Units Subcutaneous Q4H  . pantoprazole  40 mg Intravenous Q12H  . sodium chloride flush  10-40 mL Intracatheter Q12H   Continuous Infusions: PRN Meds: albuterol, hydrOXYzine, morphine injection, sodium chloride flush, traMADol Antibiotics: Anti-infectives (  From admission, onward)   Start     Dose/Rate Route  Frequency Ordered Stop   03/07/19 0030  cefTRIAXone (ROCEPHIN) 1 g in sodium chloride 0.9 % 100 mL IVPB  Status:  Discontinued     1 g 200 mL/hr over 30 Minutes Intravenous Daily at bedtime 03/07/19 0016 03/07/19 0046   03/07/19 0030  azithromycin (ZITHROMAX) 500 mg in sodium chloride 0.9 % 250 mL IVPB  Status:  Discontinued     500 mg 250 mL/hr over 60 Minutes Intravenous Daily at bedtime 03/07/19 0016 03/07/19 0046       Objective: Physical Exam: Vitals:   03/07/19 1325 03/07/19 1400 03/07/19 1613 03/07/19 1645  BP: 133/75 (!) 145/74  125/65  Pulse: 75 74  72  Resp: 20 (!) 22  18  Temp: 98.2 F (36.8 C) 98.1 F (36.7 C)  97.8 F (36.6 C)  TempSrc: Oral Oral  Oral  SpO2: 100% 100% 97% 100%  Weight:      Height:        Intake/Output Summary (Last 24 hours) at 03/07/2019 1757 Last data filed at 03/07/2019 1645 Gross per 24 hour  Intake 925 ml  Output 1825 ml  Net -900 ml   Filed Weights   03/06/19 1935 03/07/19 1223  Weight: 121.6 kg 120.2 kg   General: alert and oriented to time and place. Appear in moderate distress, affect appropriate Eyes: PERRL, Conjunctiva normal ENT: Oral Mucosa Clear, moist  Neck: no JVD, no Abnormal Mass Or lumps Cardiovascular: S1 and S2 Present, no Murmur, peripheral pulses symmetrical Respiratory: increased respiratory effort, Bilateral Air entry equal and Decreased, no use of accessory muscle, bilateral  Crackles, no wheezes Abdomen: Bowel Sound present, Soft and no tenderness, no hernia Skin: no rashes  Extremities: bilateral  Pedal edema, no calf tenderness, left upper extremity swelling as well. Neurologic: normal without focal findings, mental status, speech normal, alert and oriented x3, PERLA, Motor strength 5/5 and symmetric and sensation grossly normal to light touch Gait not checked due to patient safety concerns  Data Reviewed: CBC: Recent Labs  Lab 03/06/19 2021 03/07/19 0654 03/07/19 0936  WBC 5.9  --  4.7  NEUTROABS 4.9   --   --   HGB 5.0* 7.0* 6.9*  HCT 15.7* 21.0* 20.8*  MCV 92.9  --  90.0  PLT 191  --  932   Basic Metabolic Panel: Recent Labs  Lab 03/06/19 2021 03/07/19 0654  NA 136 137  K 3.6 3.5  CL 102 104  CO2 23 26  GLUCOSE 154* 123*  BUN 98* 93*  CREATININE 2.22* 2.19*  CALCIUM 8.8* 8.8*    Liver Function Tests: Recent Labs  Lab 03/06/19 2021  AST 46*  ALT 26  ALKPHOS 33*  BILITOT 1.2  PROT 6.1*  ALBUMIN 3.1*   No results for input(s): LIPASE, AMYLASE in the last 168 hours. No results for input(s): AMMONIA in the last 168 hours. Coagulation Profile: Recent Labs  Lab 03/06/19 2258  INR 1.3*   Cardiac Enzymes: No results for input(s): CKTOTAL, CKMB, CKMBINDEX, TROPONINI in the last 168 hours. BNP (last 3 results) No results for input(s): PROBNP in the last 8760 hours. CBG: Recent Labs  Lab 03/07/19 0123 03/07/19 0334 03/07/19 0750 03/07/19 1230 03/07/19 1656  GLUCAP 145* 130* 115* 122* 114*   Studies: Dg Chest Portable 1 View  Addendum Date: 03/06/2019   ADDENDUM REPORT: 03/06/2019 20:49 ADDENDUM: These results were called by telephone at the time of interpretation on 03/06/2019 at  8:49 pm to Dr. Coral Ceo , who verbally acknowledged these results. Electronically Signed   By: Lovena Le M.D.   On: 03/06/2019 20:49   Result Date: 03/06/2019 CLINICAL DATA:  Shortness of breath, history of lung cancer, shortness of breath for 3 days upper and lower extremity edema. EXAM: PORTABLE CHEST 1 VIEW COMPARISON:  Radiograph 01/31/2019 FINDINGS: Left subclavian approach Port-A-Cath tip terminates at the right atrium. Right chest tube is positioned in the right lung base. There is persistent consolidative opacity in the right perihilar region with a moderate right pleural effusion which layers posteriorly resulting in a gradient density of the right hemithorax. The left lung remains largely clear. Cardiomediastinal contours are unchanged from prior. There is a new,  comminuted fracture of the proximal left humerus which appears to extend through the surgical and anatomic necks involving the greater tuberosity. Inferior positioning of the humeral head relative to the left glenoid may reflect a left joint effusion. IMPRESSION: Redemonstrated consolidative opacity in the right mid lung with moderate right pleural effusion despite a right pleural drain in place. Comminuted fracture of the proximal left humerus. Inferior subluxation of the humeral head may reflect joint effusion. Electronically Signed: By: Lovena Le M.D. On: 03/06/2019 20:44     Time spent: 35 minutes  Author: Berle Mull, MD [facility] 03/07/2019 5:57 PM  To reach On-call, see care teams to locate the attending and reach out to them via [email or web address]. If 7PM-7AM, please contact night-coverage If you still have difficulty reaching the attending provider, please page the [facility] (Director on Call) for [facility] on amion for assistance.

## 2019-03-07 NOTE — Consult Note (Signed)
Chief Complaint: Patient was seen in consultation today for shortness of breath  Supervising Physician: Aletta Edouard  Patient Status: [facility] - In-pt  History of Present Illness: Darrell [name] is a 73 y.o. male known to IR from history of recurrent right pleural effusions requiring PleurX placement and management.  Patient admitted now with shortness of breath which is multifactoral.  Has new onset CHF, volume overload on Lasix, symptomatic anemia with hemoglobin of 5.0, as well as his lung cancer with recurrent effusion.  Patient has a complex fluid collection which has not been drained well with PleurX catheter.  It was last replaced 01/31/19 after failed attempts to restore flow with tPA in his previous catheter.  Patient states he has been draining 100-200 mL fluid per session at home.  He reports draining every 3 days.  States he feels little change or improvement in shortness of breath with drainage.    IR consulted for evaluation of PleurX from the ED.   Past Medical History:  Diagnosis Date  . Aortic atherosclerosis ([place]) 07/18/2018  . Cancer ([place])   . Cellulitis of right lower extremity   . Chronic kidney disease   . Diabetes mellitus without complication ([place])   . Dyslipidemia   . Hypertension   . Thoracic aortic aneurysm without rupture ([place]) 07/18/2018    Past Surgical History:  Procedure Laterality Date  . IR GUIDED DRAIN W CATHETER PLACEMENT  11/15/2018  . IR GUIDED DRAIN W CATHETER PLACEMENT  01/31/2019  . IR INSTILL VIA CHEST TUBE AGENT FOR FIBRINOLYSIS INI DAY  01/23/2019  . IR REMOVAL OF PLURAL CATH W/CUFF  01/31/2019  . IR THORACENTESIS ASP PLEURAL SPACE W/IMG GUIDE  09/05/2018  . IR THORACENTESIS ASP PLEURAL SPACE W/IMG GUIDE  10/11/2018  . IR THORACENTESIS ASP PLEURAL SPACE W/IMG GUIDE  10/30/2018  . PORTACATH PLACEMENT Left 02/21/2018   Procedure: INSERTION PORT-A-CATH;  Surgeon: Grace Isaac, MD;  Location: [place];  Service: Thoracic;  Laterality: Left;     Allergies: Percocet [oxycodone-acetaminophen] and Lisinopril  Medications: Prior to Admission medications   Medication Sig Start Date End Date Taking? Authorizing Provider  albuterol (PROVENTIL HFA;VENTOLIN HFA) 108 (90 Base) MCG/ACT inhaler Inhale 2 puffs into the lungs every 4 (four) hours as needed for wheezing or shortness of breath. 08/03/18  Yes Byrum, Rose Fillers, MD  carvedilol (COREG) 6.25 MG tablet Take 6.25 mg by mouth 2 (two) times daily with a meal.  07/19/16  Yes [provider]  cloNIDine (CATAPRES) 0.1 MG tablet Take 1 tablet (0.1 mg total) by mouth daily. 02/28/19  Yes Eugenie Filler, MD  fenofibrate 160 MG tablet Take 160 mg by mouth daily.  11/13/17  Yes [provider]  furosemide (LASIX) 40 MG tablet Take 1 tablet (40 mg total) by mouth 2 (two) times daily. 02/28/19  Yes Eugenie Filler, MD  Insulin Detemir (LEVEMIR FLEXTOUCH) 100 UNIT/ML Pen Inject 10 Units into the skin daily.  07/18/15  Yes [provider]  lidocaine-prilocaine (EMLA) cream Apply 1 application topically as needed. Patient taking differently: Apply 1 application topically as needed (port).  02/16/18  Yes Curt Bears, MD  Multiple Vitamin (MULTI-VITAMINS) TABS Take 1 tablet by mouth daily.    Yes [provider]  nystatin (MYCOSTATIN/NYSTOP) powder Apply topically 3 (three) times daily. 02/28/19  Yes Eugenie Filler, MD  Omega-3 1000 MG CAPS Take 1,200 mg by mouth 2 (two) times daily.    Yes [provider]  polyethylene glycol (MIRALAX /  GLYCOLAX) 17 g packet Take 17 g by mouth daily. 03/01/19  Yes Eugenie Filler, MD  potassium chloride 20 MEQ TBCR Take 20 mEq by mouth daily for 7 days. 02/28/19 03/07/19 Yes Eugenie Filler, MD  pravastatin (PRAVACHOL) 40 MG tablet Take 40 mg by mouth daily.  01/08/18  Yes [provider]  prochlorperazine (COMPAZINE) 10 MG tablet TAKE 1 TABLET BY MOUTH EVERY 6 HOURS AS NEEDED FOR NAUSEA OR VOMITING Patient  taking differently: Take 10 mg by mouth every 6 (six) hours as needed for nausea or vomiting.  02/21/18  Yes Curt Bears, MD  senna-docusate (SENOKOT-S) 8.6-50 MG tablet Take 2 tablets by mouth 2 (two) times daily. 02/28/19  Yes Eugenie Filler, MD  traMADol (ULTRAM) 50 MG tablet Take 1 tablet (50 mg total) by mouth every 6 (six) hours as needed for severe pain. 06/23/18  Yes Curt Bears, MD  Insulin Pen Needle (FIFTY50 PEN NEEDLES) 31G X 8 MM MISC USE AS DIRECTED 06/30/15   [provider]  oxyCODONE-acetaminophen (PERCOCET/ROXICET) 5-325 MG tablet Take 1 tablet by mouth every 4 (four) hours as needed for severe pain. Patient not taking: Reported on 03/06/2019 02/14/19   Marybelle Killings, MD     Family History  Problem Relation Age of Onset  . Breast cancer Mother   . CAD Father     Social History   Socioeconomic History  . Marital status: Married    Spouse name: Not on file  . Number of children: Not on file  . Years of education: Not on file  . Highest education level: Not on file  Occupational History  . Not on file  Social Needs  . Financial resource strain: Not on file  . Food insecurity    Worry: Not on file    Inability: Not on file  . Transportation needs    Medical: Not on file    Non-medical: Not on file  Tobacco Use  . Smoking status: Former Smoker    Packs/day: 2.00    Years: 35.00    Pack years: 70.00    Types: Cigarettes    Quit date: 07/12/1996    Years since quitting: 22.6  . Smokeless tobacco: Never Used  Substance and Sexual Activity  . Alcohol use: Yes    Comment: occasionally  . Drug use: Never  . Sexual activity: Not Currently  Lifestyle  . Physical activity    Days per week: Not on file    Minutes per session: Not on file  . Stress: Not on file  Relationships  . Social Herbalist on phone: Not on file    Gets together: Not on file    Attends religious service: Not on file    Active member of club or organization: Not  on file    Attends meetings of clubs or organizations: Not on file    Relationship status: Not on file  Other Topics Concern  . Not on file  Social History Narrative   05-15-18 Unable to ask abuse questions wife with him today     Review of Systems: A 12 point ROS discussed and pertinent positives are indicated in the HPI above.  All other systems are negative.  Review of Systems  Constitutional: Negative for fatigue and fever.  Respiratory: Positive for shortness of breath. Negative for cough.   Cardiovascular: Negative for chest pain.  Gastrointestinal: Negative for abdominal pain.  Musculoskeletal: Negative for back pain.  Psychiatric/Behavioral: Negative for behavioral  problems and confusion.    Vital Signs: BP (!) 148/75   Pulse 73   Temp 97.7 F (36.5 C) (Oral)   Resp 20   Ht 5\' 9"  (1.753 m)   Wt 268 lb (121.6 kg)   SpO2 98%   BMI 39.58 kg/m   Physical Exam Vitals signs and nursing note reviewed.  Constitutional:      Appearance: He is well-developed.  Cardiovascular:     Rate and Rhythm: Normal rate and regular rhythm.  Pulmonary:     Effort: No tachypnea or respiratory distress.     Comments: At rest PleurX in place on Right.  Site c/d/i.  Some dried blood.  Skin:    General: Skin is warm and dry.  Neurological:     General: No focal deficit present.     Mental Status: He is alert and oriented to person, place, and time.      Imaging: Dg Chest 2 View  Result Date: 02/22/2019 CLINICAL DATA:  Shortness of breath. EXAM: CHEST - 2 VIEW COMPARISON:  January 31, 2019 FINDINGS: Injectable port in stable position. Normal cardiac silhouette. Stable volume loss in the right hemithorax with loculated pleural effusion. Stable soft tissue thickening in the right perihilar region. The left lung is clear. Osseous structures are without acute abnormality. Soft tissues are grossly normal. IMPRESSION: 1. Stable volume loss in the right hemithorax with loculated pleural  effusion. 2. Stable soft tissue thickening in the right perihilar region. Electronically Signed   By: Fidela Salisbury M.D.   On: 02/22/2019 15:58   Dg Chest Portable 1 View  Addendum Date: 03/06/2019   ADDENDUM REPORT: 03/06/2019 20:49 ADDENDUM: These results were called by telephone at the time of interpretation on 03/06/2019 at 8:49 pm to Dr. Coral Ceo , who verbally acknowledged these results. Electronically Signed   By: Lovena Le M.D.   On: 03/06/2019 20:49   Result Date: 03/06/2019 CLINICAL DATA:  Shortness of breath, history of lung cancer, shortness of breath for 3 days upper and lower extremity edema. EXAM: PORTABLE CHEST 1 VIEW COMPARISON:  Radiograph 01/31/2019 FINDINGS: Left subclavian approach Port-A-Cath tip terminates at the right atrium. Right chest tube is positioned in the right lung base. There is persistent consolidative opacity in the right perihilar region with a moderate right pleural effusion which layers posteriorly resulting in a gradient density of the right hemithorax. The left lung remains largely clear. Cardiomediastinal contours are unchanged from prior. There is a new, comminuted fracture of the proximal left humerus which appears to extend through the surgical and anatomic necks involving the greater tuberosity. Inferior positioning of the humeral head relative to the left glenoid may reflect a left joint effusion. IMPRESSION: Redemonstrated consolidative opacity in the right mid lung with moderate right pleural effusion despite a right pleural drain in place. Comminuted fracture of the proximal left humerus. Inferior subluxation of the humeral head may reflect joint effusion. Electronically Signed: By: Lovena Le M.D. On: 03/06/2019 20:44   Dg Chest Port 1 View  Result Date: 02/26/2019 CLINICAL DATA:  Shortness of breath EXAM: PORTABLE CHEST 1 VIEW COMPARISON:  01/16/2019 chest CT FINDINGS: Decreased pleuroparenchymal opacity at the right base. There is right  perihilar radiation fibrosis. Cardiomegaly. Unremarkable left-sided porta catheter. IMPRESSION: 1. Decreased pleural fluid on the right. 2. Right perihilar fibrosis and chronic volume loss. Electronically Signed   By: Monte Fantasia M.D.   On: 02/26/2019 04:27   Dg Shoulder Left  Result Date: 02/25/2019 CLINICAL DATA:  Pain status post fall EXAM: LEFT SHOULDER - 2+ VIEW COMPARISON:  January 30, 2019 FINDINGS: There is a partially visualized left subclavian Port-A-Cath. Again noted is an impacted fracture of the left humeral head and neck. The fracture appears slightly more impacted on today's exam. There is mild surrounding soft tissue swelling. There is some evidence of mild interval callus formation. There is no glenohumeral dislocation. Osteopenia is noted. IMPRESSION: 1. Again noted is an impacted fracture of the proximal left humerus. There has been slight interval increase in impaction since the prior study. There is minimal surrounding callus formation. 2. No glenohumeral dislocation. Electronically Signed   By: Constance Holster M.D.   On: 02/25/2019 16:50   Vas [place] Upper Extremity Venous Duplex  Result Date: 02/26/2019 UPPER VENOUS STUDY  Indications: Swelling, and recent left shoulder fracture Limitations: Restricted mobility, body habitus and poor ultrasound/tissue interface. Comparison Study: No prior study. Performing Technologist: Maudry Mayhew MHA, RDMS, RVT, RDCS  Examination Guidelines: A complete evaluation includes B-mode imaging, spectral Doppler, color Doppler, and power Doppler as needed of all accessible portions of each vessel. Bilateral testing is considered an integral part of a complete examination. Limited examinations for reoccurring indications may be performed as noted.  Right Findings: +----------+------------+---------+-----------+----------+--------------+ RIGHT     CompressiblePhasicitySpontaneousProperties   Summary      +----------+------------+---------+-----------+----------+--------------+ Subclavian                                          Not visualized +----------+------------+---------+-----------+----------+--------------+  Left Findings: +----------+------------+---------+-----------+----------+--------------+ LEFT      CompressiblePhasicitySpontaneousProperties   Summary     +----------+------------+---------+-----------+----------+--------------+ IJV           Full    pulsatile    Yes                             +----------+------------+---------+-----------+----------+--------------+ Subclavian            pulsatile    Yes                             +----------+------------+---------+-----------+----------+--------------+ Axillary      Full    pulsatile    Yes                             +----------+------------+---------+-----------+----------+--------------+ Brachial      Full                 Yes                             +----------+------------+---------+-----------+----------+--------------+ Radial        Full                                                 +----------+------------+---------+-----------+----------+--------------+ Ulnar                                               Not visualized +----------+------------+---------+-----------+----------+--------------+ Cephalic      Full                                                 +----------+------------+---------+-----------+----------+--------------+  Basilic                                             Not visualized +----------+------------+---------+-----------+----------+--------------+  Summary:  Left: No evidence of deep vein thrombosis in the upper extremity. No evidence of superficial vein thrombosis in the upper extremity. This was a limited study.  *See table(s) above for measurements and observations.  Diagnosing physician: Servando Snare MD Electronically signed by Servando Snare MD  on 02/26/2019 at 9:56:17 PM.    Final     Labs:  CBC: Recent Labs    02/27/19 0406 02/28/19 5638 03/06/19 2021 03/07/19 0654 03/07/19 0936  WBC 3.6* 3.8* 5.9  --  4.7  HGB 8.4* 8.1* 5.0* 7.0* 6.9*  HCT 26.2* 25.5* 15.7* 21.0* 20.8*  PLT 206 196 191  --  192    COAGS: Recent Labs    11/15/18 1133 01/31/19 0834 03/06/19 2258  INR 1.3* 1.3* 1.3*  APTT  --  37*  --     BMP: Recent Labs    02/27/19 0406 02/28/19 1159 03/06/19 2021 03/07/19 0654  NA 135 133* 136 137  K 4.2 4.0 3.6 3.5  CL 97* 94* 102 104  CO2 28 27 23 26   GLUCOSE 112* 116* 154* 123*  BUN 55* 65* 98* 93*  CALCIUM 9.1 9.3 8.8* 8.8*  CREATININE 2.01* 2.28* 2.22* 2.19*  GFRNONAA 33* 28* 29* 29*  GFRAA 38* 32* 34* 34*    LIVER FUNCTION TESTS: Recent Labs    02/05/19 0823 02/19/19 0845 02/22/19 2120 03/06/19 2021  BILITOT 0.7 0.7 0.9 1.2  AST 18 23 26  46*  ALT 10 12 17 26   ALKPHOS 40 48 47 33*  PROT 6.9 6.9 6.8 6.1*  ALBUMIN 3.2* 3.3* 3.3* 3.1*    TUMOR MARKERS: No results for input(s): AFPTM, CEA, CA199, CHROMGRNA in the last 8760 hours.  Assessment and Plan: Shortness of breath Patient with shortness of breath in setting of lung cancer with recurrent right pleural effusion. His effusion has been managed with PleurX catheter in the past which has become increasing difficult to use to loculated fluid.  His currently catheter is in place and has been draining 100-200 mL at home.  PA connected to PleurX today and was able to drain 30 mL of blood-tinged fluid with difficulty.  Patient's shortness of breath is multifactorial.  He reports improvement in symptoms at rest with blood transfusion and is planning for additional unit this afternoon.  Discussed with Dr. Kathlene Cote.  Plan for repeat CXR 2 view tomorrow morning.   Thank you for this interesting consult.  I greatly enjoyed meeting ROSA GAMBALE and look forward to participating in their care.  A copy of this report was sent to the  requesting provider on this date.  Electronically Signed: Docia Barrier, PA 03/07/2019, 12:07 PM   I spent a total of 40 Minutes    in face to face in clinical consultation, greater than 50% of which was counseling/coordinating care for pleural effusion.

## 2019-03-07 NOTE — ED Notes (Signed)
Pt stood at bedside to use urinal with assist from [place], Therapist, sports. Pt assisted back in bed and repositioned. Pt has call bell within reach.

## 2019-03-07 NOTE — ED Notes (Signed)
ED TO INPATIENT HANDOFF REPORT  ED Nurse Name and Phone #: Corey Skains RN [phone]  S Name/Age/Gender Darrell [name] 73 y.o. male Room/Bed: WA18/WA18  Code Status   Code Status: Full Code  Home/SNF/Other Home Patient oriented to: self, place, time and situation Is this baseline? Yes   Triage Complete: Triage complete  Chief Complaint [facility]  Triage Note Patient is from home and transported via [facility]. Patient has a history of lung cancer and complaining of shortness of breath for the last 3 days. Also, patient has edema in upper and lower extremities with his upper extremities being usual swollen, sleepy, and pale. He is positive for left shoulder fracture 3 weeks ago.    Allergies Allergies  Allergen Reactions  . Percocet [Oxycodone-Acetaminophen] Itching  . Lisinopril Cough         Level of Care/Admitting Diagnosis ED Disposition    ED Disposition Condition Comment   Admit  Hospital Area: [facility] [[id number]]  Level of Care: Telemetry [5]  Admit to tele based on following criteria: Complex arrhythmia (Bradycardia/Tachycardia)  Covid Evaluation: Confirmed COVID Negative  Diagnosis: Acute upper GI bleed [852778]  Admitting Physician: Lavina Hamman [[id number]]  Attending Physician: Lavina Hamman [[id number]]  PT Class (Do Not Modify): Observation [104]  PT Acc Code (Do Not Modify): Observation [10022]       B Medical/Surgery History Past Medical History:  Diagnosis Date  . Aortic atherosclerosis ([place]) 07/18/2018  . Cancer ([place])   . Cellulitis of right lower extremity   . Chronic kidney disease   . Diabetes mellitus without complication ([place])   . Dyslipidemia   . Hypertension   . Thoracic aortic aneurysm without rupture ([place]) 07/18/2018   Past Surgical History:  Procedure Laterality Date  . IR GUIDED DRAIN W CATHETER PLACEMENT  11/15/2018  . IR GUIDED DRAIN W CATHETER PLACEMENT  01/31/2019  . IR INSTILL VIA CHEST TUBE AGENT FOR  FIBRINOLYSIS INI DAY  01/23/2019  . IR REMOVAL OF PLURAL CATH W/CUFF  01/31/2019  . IR THORACENTESIS ASP PLEURAL SPACE W/IMG GUIDE  09/05/2018  . IR THORACENTESIS ASP PLEURAL SPACE W/IMG GUIDE  10/11/2018  . IR THORACENTESIS ASP PLEURAL SPACE W/IMG GUIDE  10/30/2018  . PORTACATH PLACEMENT Left 02/21/2018   Procedure: INSERTION PORT-A-CATH;  Surgeon: Grace Isaac, MD;  Location: [place];  Service: Thoracic;  Laterality: Left;     A IV Location/Drains/Wounds Patient Lines/Drains/Airways Status   Active Line/Drains/Airways    Name:   Placement date:   Placement time:   Site:   Days:   Implanted Port 02/21/18 Left Chest   02/21/18    1418    Chest   379   Peripheral IV 03/06/19 Right Forearm   03/06/19    2323    Forearm   1   Chest Tube 1 Right Pleural 15.5 Fr.   01/31/19    0951    Pleural   35   External Urinary Catheter   02/27/19    0900    -   8   Incision (Closed) 02/21/18 Chest Left   02/21/18    1414     379   Incision (Closed) 11/15/18 Abdomen Right   11/15/18    1313     112   Incision (Closed) 01/31/19 Abdomen Right   01/31/19    0949     35   Incision (Closed) 01/31/19 Abdomen Right   01/31/19    [phone]  35   Incision (Closed) 01/31/19 Abdomen Right   01/31/19    0954     35          Intake/Output Last 24 hours  Intake/Output Summary (Last 24 hours) at 03/07/2019 1150 Last data filed at 03/07/2019 0516 Gross per 24 hour  Intake 315 ml  Output 825 ml  Net -510 ml    Labs/Imaging Results for orders placed or performed during the hospital encounter of 03/06/19 (from the past 48 hour(s))  CBC with Differential     Status: Abnormal   Collection Time: 03/06/19  8:21 PM  Result Value Ref Range   WBC 5.9 4.0 - 10.5 K/uL   RBC 1.69 (L) 4.22 - 5.81 MIL/uL   Hemoglobin 5.0 (LL) 13.0 - 17.0 g/dL    Comment: This critical result has verified and been called to RN J OXENDAUN by Alda Lea on 08 25 2020 at 2209, and has been read back.    HCT 15.7 (L) 39.0 - 52.0 %   MCV  92.9 80.0 - 100.0 fL   MCH 29.6 26.0 - 34.0 pg   MCHC 31.8 30.0 - 36.0 g/dL   RDW 19.4 (H) 11.5 - 15.5 %   Platelets 191 150 - 400 K/uL   nRBC 1.5 (H) 0.0 - 0.2 %   Neutrophils Relative % 84 %   Neutro Abs 4.9 1.7 - 7.7 K/uL   Lymphocytes Relative 5 %   Lymphs Abs 0.3 (L) 0.7 - 4.0 K/uL   Monocytes Relative 9 %   Monocytes Absolute 0.5 0.1 - 1.0 K/uL   Eosinophils Relative 2 %   Eosinophils Absolute 0.1 0.0 - 0.5 K/uL   Basophils Relative 0 %   Basophils Absolute 0.0 0.0 - 0.1 K/uL   Immature Granulocytes 0 %   Abs Immature Granulocytes 0.02 0.00 - 0.07 K/uL    Comment: Performed at [facility], [place] [address], [place], [place] [address]  Comprehensive metabolic panel     Status: Abnormal   Collection Time: 03/06/19  8:21 PM  Result Value Ref Range   Sodium 136 135 - 145 mmol/L   Potassium 3.6 3.5 - 5.1 mmol/L   Chloride 102 98 - 111 mmol/L   CO2 23 22 - 32 mmol/L   Glucose, Bld 154 (H) 70 - 99 mg/dL   BUN 98 (H) 8 - 23 mg/dL   Creatinine, Ser 2.22 (H) 0.61 - 1.24 mg/dL   Calcium 8.8 (L) 8.9 - 10.3 mg/dL   Total Protein 6.1 (L) 6.5 - 8.1 g/dL   Albumin 3.1 (L) 3.5 - 5.0 g/dL   AST 46 (H) 15 - 41 U/L   ALT 26 0 - 44 U/L   Alkaline Phosphatase 33 (L) 38 - 126 U/L   Total Bilirubin 1.2 0.3 - 1.2 mg/dL   GFR calc non Af Amer 29 (L) >60 mL/min   GFR calc Af Amer 34 (L) >60 mL/min   Anion gap 11 5 - 15    Comment: Performed at [facility], [place] [address] [place], [place] [address]  Type and screen [place]     Status: None (Preliminary result)   Collection Time: 03/06/19  8:21 PM  Result Value Ref Range   ABO/RH(D) B POS    Antibody Screen NEG    Sample Expiration 03/09/2019,2359    Unit Number [id number]    Blood Component Type RED CELLS,LR    Unit division 00    Status of  Unit ISSUED    Transfusion Status OK TO TRANSFUSE    Crossmatch Result Compatible    Unit Number [id number]    Blood Component  Type RED CELLS,LR    Unit division 00    Status of Unit ISSUED,FINAL    Transfusion Status OK TO TRANSFUSE    Crossmatch Result      Compatible Performed at [facility] [address] [place], [place] [address]   Brain natriuretic peptide     Status: Abnormal   Collection Time: 03/06/19  8:22 PM  Result Value Ref Range   B Natriuretic Peptide 334.3 (H) 0.0 - 100.0 pg/mL    Comment: Performed at [facility], [place] [address], [place], [place] [address]  Urinalysis, Routine w reflex microscopic     Status: Abnormal   Collection Time: 03/06/19 10:16 PM  Result Value Ref Range   Color, Urine YELLOW YELLOW   APPearance CLEAR CLEAR   Specific Gravity, Urine 1.013 1.005 - 1.030   pH 5.0 5.0 - 8.0   Glucose, UA NEGATIVE NEGATIVE mg/dL   Hgb urine dipstick NEGATIVE NEGATIVE   Bilirubin Urine NEGATIVE NEGATIVE   Ketones, ur NEGATIVE NEGATIVE mg/dL   Protein, ur NEGATIVE NEGATIVE mg/dL   Nitrite NEGATIVE NEGATIVE   Leukocytes,Ua MODERATE (A) NEGATIVE   RBC / HPF 0-5 0 - 5 RBC/hpf   WBC, UA 0-5 0 - 5 WBC/hpf   Bacteria, UA RARE (A) NONE SEEN   Squamous Epithelial / LPF 0-5 0 - 5   Mucus PRESENT     Comment: Performed at [facility], [place] [address], [place], [place] [address]  Prepare RBC     Status: None   Collection Time: 03/06/19 10:20 PM  Result Value Ref Range   Order Confirmation      ORDER PROCESSED BY BLOOD BANK Performed at [place] [address], [place], [place] [address]   POC occult blood, ED     Status: Abnormal   Collection Time: 03/06/19 10:40 PM  Result Value Ref Range   Fecal Occult Bld POSITIVE (A) NEGATIVE  Protime-INR     Status: Abnormal   Collection Time: 03/06/19 10:58 PM  Result Value Ref Range   Prothrombin Time 16.3 (H) 11.4 - 15.2 seconds   INR 1.3 (H) 0.8 - 1.2    Comment: (NOTE) INR goal varies based on device and disease states. Performed at [facility], [place] [address] [place], [place] [address]   SARS CORONAVIRUS 2 (TAT 6-12 HRS) Nasal Swab     Status: None   Collection Time: 03/06/19 11:01 PM   Specimen: Nasal Swab  Result Value Ref Range   SARS Coronavirus 2 NEGATIVE NEGATIVE    Comment: (NOTE) SARS-CoV-2 target nucleic acids are NOT DETECTED. The SARS-CoV-2 RNA is generally detectable in upper and lower respiratory specimens during the acute phase of infection. Negative results do not preclude SARS-CoV-2 infection, do not rule out co-infections with other pathogens, and should not be used as the sole basis for treatment or other patient management decisions. Negative results must be combined with clinical observations, patient history, and epidemiological information. The expected result is Negative. Fact Sheet for Patients: SugarRoll.be Fact Sheet for Healthcare Providers: https://www.woods-mathews.com/ This test is not yet approved or cleared by the [place] FDA and  has been authorized for detection and/or diagnosis of SARS-CoV-2 by FDA under an Emergency Use Authorization (EUA). This EUA will remain  in effect (meaning this  test can be used) for the duration of the COVID-19 declaration under Section 56 4(b)(1) of the Act, 21 U.S.C. section 360bbb-3(b)(1), unless the authorization is terminated or revoked sooner. Performed at [facility], [place] [address], [place], [place] [address]   CBG monitoring, ED     Status: Abnormal   Collection Time: 03/07/19  1:23 AM  Result Value Ref Range   Glucose-Capillary 145 (H) 70 - 99 mg/dL  CBG monitoring, ED     Status: Abnormal   Collection Time: 03/07/19  3:34 AM  Result Value Ref Range   Glucose-Capillary 130 (H) 70 - 99 mg/dL  Hemoglobin and hematocrit, blood     Status: Abnormal   Collection Time: 03/07/19  6:54 AM  Result Value Ref Range   Hemoglobin 7.0 (L) 13.0 - 17.0 g/dL    Comment: REPEATED TO VERIFY POST  TRANSFUSION SPECIMEN DELTA CHECK NOTED    HCT 21.0 (L) 39.0 - 52.0 %    Comment: Performed at [facility], [place] [address] [place], [place] [address]  Basic metabolic panel     Status: Abnormal   Collection Time: 03/07/19  6:54 AM  Result Value Ref Range   Sodium 137 135 - 145 mmol/L   Potassium 3.5 3.5 - 5.1 mmol/L   Chloride 104 98 - 111 mmol/L   CO2 26 22 - 32 mmol/L   Glucose, Bld 123 (H) 70 - 99 mg/dL   BUN 93 (H) 8 - 23 mg/dL   Creatinine, Ser 2.19 (H) 0.61 - 1.24 mg/dL   Calcium 8.8 (L) 8.9 - 10.3 mg/dL   GFR calc non Af Amer 29 (L) >60 mL/min   GFR calc Af Amer 34 (L) >60 mL/min   Anion gap 7 5 - 15    Comment: Performed at [facility], [place] [address], [place], [place] [address]  Procalcitonin - Baseline     Status: None   Collection Time: 03/07/19  6:54 AM  Result Value Ref Range   Procalcitonin <0.10 ng/mL    Comment:        Interpretation: PCT (Procalcitonin) <= 0.5 ng/mL: Systemic infection (sepsis) is not likely. Local bacterial infection is possible. (NOTE)       Sepsis PCT Algorithm           Lower Respiratory Tract                                      Infection PCT Algorithm    ----------------------------     ----------------------------         PCT < 0.25 ng/mL                PCT < 0.10 ng/mL         Strongly encourage             Strongly discourage   discontinuation of antibiotics    initiation of antibiotics    ----------------------------     -----------------------------       PCT 0.25 - 0.50 ng/mL            PCT 0.10 - 0.25 ng/mL               OR       >80% decrease in PCT            Discourage initiation of  antibiotics      Encourage discontinuation           of antibiotics    ----------------------------     -----------------------------         PCT >= 0.50 ng/mL              PCT 0.26 - 0.50 ng/mL               AND        <80% decrease in PCT              Encourage initiation of                                             antibiotics       Encourage continuation           of antibiotics    ----------------------------     -----------------------------        PCT >= 0.50 ng/mL                  PCT > 0.50 ng/mL               AND         increase in PCT                  Strongly encourage                                      initiation of antibiotics    Strongly encourage escalation           of antibiotics                                     -----------------------------                                           PCT <= 0.25 ng/mL                                                 OR                                        > 80% decrease in PCT                                     Discontinue / Do not initiate                                             antibiotics Performed at [place] [address], [place], [place] [address]   CBG monitoring, ED     Status: Abnormal   Collection Time:  03/07/19  7:50 AM  Result Value Ref Range   Glucose-Capillary 115 (H) 70 - 99 mg/dL  CBC     Status: Abnormal   Collection Time: 03/07/19  9:36 AM  Result Value Ref Range   WBC 4.7 4.0 - 10.5 K/uL   RBC 2.31 (L) 4.22 - 5.81 MIL/uL   Hemoglobin 6.9 (LL) 13.0 - 17.0 g/dL    Comment: This critical result has verified and been called to [place] by Raelyn Ensign on 08 26 2020 at 0957, and has been read back. CRITICAL RESULT VERIFIED   HCT 20.8 (L) 39.0 - 52.0 %   MCV 90.0 80.0 - 100.0 fL   MCH 29.9 26.0 - 34.0 pg   MCHC 33.2 30.0 - 36.0 g/dL   RDW 17.8 (H) 11.5 - 15.5 %   Platelets 192 150 - 400 K/uL   nRBC 1.9 (H) 0.0 - 0.2 %    Comment: Performed at [facility], [place] [address] [place], [place] [address]   Dg Chest Portable 1 View  Addendum Date: 03/06/2019   ADDENDUM REPORT: 03/06/2019 20:49 ADDENDUM: These results were called by telephone at the time of interpretation on 03/06/2019 at 8:49 pm to Dr.  Coral Ceo , who verbally acknowledged these results. Electronically Signed   By: Lovena Le M.D.   On: 03/06/2019 20:49   Result Date: 03/06/2019 CLINICAL DATA:  Shortness of breath, history of lung cancer, shortness of breath for 3 days upper and lower extremity edema. EXAM: PORTABLE CHEST 1 VIEW COMPARISON:  Radiograph 01/31/2019 FINDINGS: Left subclavian approach Port-A-Cath tip terminates at the right atrium. Right chest tube is positioned in the right lung base. There is persistent consolidative opacity in the right perihilar region with a moderate right pleural effusion which layers posteriorly resulting in a gradient density of the right hemithorax. The left lung remains largely clear. Cardiomediastinal contours are unchanged from prior. There is a new, comminuted fracture of the proximal left humerus which appears to extend through the surgical and anatomic necks involving the greater tuberosity. Inferior positioning of the humeral head relative to the left glenoid may reflect a left joint effusion. IMPRESSION: Redemonstrated consolidative opacity in the right mid lung with moderate right pleural effusion despite a right pleural drain in place. Comminuted fracture of the proximal left humerus. Inferior subluxation of the humeral head may reflect joint effusion. Electronically Signed: By: Lovena Le M.D. On: 03/06/2019 20:44    Pending Labs Unresulted Labs (From admission, onward)    Start     Ordered   03/07/19 1048  Prepare RBC  (Adult Blood Administration - Red Blood Cells)  Once,   R    Question Answer Comment  # of Units 1 unit   Transfusion Indications Symptomatic Anemia   If emergent release call blood bank Not emergent release   Instructions: Transfuse      03/07/19 1047   03/07/19 1000  CBC  Now then every 6 hours,   R     03/07/19 0924   03/06/19 2310  Urine culture  ONCE - STAT,   STAT     03/06/19 2309          Vitals/Pain Today's Vitals   03/07/19 1030 03/07/19  1100 03/07/19 1115 03/07/19 1122  BP: 129/67 (!) 148/75  (!) 148/75  Pulse: 77  76 73  Resp: 20 (!) 22 15 20   Temp:      TempSrc:      SpO2: 97%  98%  98%  Weight:      Height:      PainSc:        Isolation Precautions No active isolations  Medications Medications  pantoprazole (PROTONIX) injection 40 mg (40 mg Intravenous Given 03/07/19 0919)  insulin aspart (novoLOG) injection 0-9 Units (0 Units Subcutaneous Not Given 03/07/19 0753)  morphine 2 MG/ML injection 2 mg (2 mg Intravenous Given 03/07/19 0920)  traMADol (ULTRAM) tablet 50 mg (50 mg Oral Given 03/07/19 0800)  0.9 %  sodium chloride infusion (Manually program via Guardrails IV Fluids) (has no administration in time range)  0.9 %  sodium chloride infusion (Manually program via Guardrails IV Fluids) ( Intravenous Stopped 03/07/19 0658)  pantoprazole (PROTONIX) injection 40 mg (40 mg Intravenous Given 03/06/19 2311)    Mobility non-ambulatory Low fall risk   Focused Assessments  Pulmonary Assessment Handoff:  Lung sounds: Bilateral Breath Sounds: Coarse crackles L Breath Sounds: Clear O2 Device: Room Air        R Recommendations: See Admitting Provider Note  Report given to:   Additional Notes: 2 person assist. Wife present

## 2019-03-07 NOTE — Plan of Care (Signed)
  Problem: Clinical Measurements: Goal: Respiratory complications will improve Outcome: Progressing Goal: Cardiovascular complication will be avoided Outcome: Progressing   Problem: Activity: Goal: Risk for activity intolerance will decrease Outcome: Progressing   Problem: Pain Managment: Goal: General experience of comfort will improve Outcome: Progressing

## 2019-03-07 NOTE — ED Notes (Signed)
Pt able to stand at bedside with x1 assist. While completing orthostatic vital signs, pt had x2 assist incase pt became dizzy upon standing. Pt tolerated standing well. Pt repositioned back in bed and has call bell within reach.

## 2019-03-07 NOTE — ED Notes (Signed)
Pt assisted up to [facility] with Probation officer and Glennon Mac, EMT. Pt able to stand/pivot from bed to [facility].

## 2019-03-07 NOTE — ED Notes (Signed)
Critical value- Hemoglobin-6.9

## 2019-03-07 NOTE — ED Notes (Signed)
Moved patient to a hospital bed for better comfort since he will be in the ED tonight until a bed becomes available.

## 2019-03-07 NOTE — ED Notes (Signed)
Hospitalist said to not give Lasix.

## 2019-03-07 NOTE — ED Notes (Addendum)
Pt repositioned in bed after standing at bedside to use urinal. Pt has call bell within reach and bed is in its lowest position.

## 2019-03-07 NOTE — ED Notes (Signed)
Assisted Sam, NT with getting patient to a standing position to urinate and repositioned back on to bed.

## 2019-03-07 NOTE — TOC Initial Note (Signed)
Transition of Care [facility]) - Initial/Assessment Note    Patient Details  Name: CASPIAN DELEONARDIS MRN: [id number] Date of Birth: 07-04-1946  Transition of [facility]) CM/SW Contact:    Dessa Phi, RN Phone Number: 03/07/2019, 12:30 PM  Clinical Narrative: Active wAHH-HHRN?PT-rep Santiago Glad aware & following. Monitor for d/c needs.                  Expected Discharge Plan: [place] Barriers to Discharge: Continued Medical Work up   Patient Goals and CMS Choice Patient states their goals for this hospitalization and ongoing recovery are:: go home      Expected Discharge Plan and Services Expected Discharge Plan: [place]   Discharge Planning Services: CM Consult Post Acute Care Choice: [place] arrangements for the past 2 months: Single Family Home Expected Discharge Date: (unknown)                                    Prior Living Arrangements/Services Living arrangements for the past 2 months: Single Family Home Lives with:: Spouse Patient language and need for interpreter reviewed:: Yes        Need for Family Participation in Patient Care: No (Comment) Care giver support system in place?: Yes (comment) Current home services: Home RN, Home PT Criminal Activity/Legal Involvement Pertinent to Current Situation/Hospitalization: No - Comment as needed  Activities of Daily Living Home Assistive Devices/Equipment: Eyeglasses, CBG Meter, Other (Comment)(sling for left upper extremity) ADL Screening (condition at time of admission) Patient's cognitive ability adequate to safely complete daily activities?: Yes Is the patient deaf or have difficulty hearing?: No Does the patient have difficulty seeing, even when wearing glasses/contacts?: No Does the patient have difficulty concentrating, remembering, or making decisions?: No Patient able to express need for assistance with ADLs?: Yes Does the patient have difficulty dressing or  bathing?: Yes Independently performs ADLs?: No Communication: Independent Dressing (OT): Needs assistance Is this a change from baseline?: Pre-admission baseline Grooming: Needs assistance Is this a change from baseline?: Pre-admission baseline Feeding: Needs assistance Is this a change from baseline?: Pre-admission baseline Bathing: Needs assistance Is this a change from baseline?: Pre-admission baseline Toileting: Needs assistance Is this a change from baseline?: Pre-admission baseline In/Out Bed: Needs assistance Is this a change from baseline?: Pre-admission baseline Walks in Home: Needs assistance Is this a change from baseline?: Pre-admission baseline Does the patient have difficulty walking or climbing stairs?: Yes Weakness of Legs: Both Weakness of Arms/Hands: Left  Permission Sought/Granted Permission sought to share information with : Case Manager                Emotional Assessment Appearance:: Appears stated age Attitude/Demeanor/Rapport: Gracious   Orientation: : Oriented to Self, Oriented to Place, Oriented to  Time, Oriented to Situation   Psych Involvement: No (comment)  Admission diagnosis:  Recurrent right pleural effusion [J90] Symptomatic anemia [D64.9] Acute upper GI bleed [K92.2] Patient Active Problem List   Diagnosis Date Noted  . Anemia 03/07/2019  . Acute upper GI bleed 03/07/2019  . Pain and swelling of right upper extremity   . Acute diastolic CHF (congestive heart failure) ([place]) 02/22/2019  . Closed fracture of left proximal humerus 02/02/2019  . Hypokalemia 09/18/2018  . Dyspnea 08/29/2018  . COPD (chronic obstructive pulmonary disease) ([place]) 08/03/2018  . Hemoptysis 07/18/2018  . CKD (chronic kidney disease) stage 3, GFR 30-59 ml/min (HCC)  07/18/2018  . Lobar pneumonia ([place]) 07/18/2018  . Thoracic aortic aneurysm without rupture ([place]) 07/18/2018  . Aortic atherosclerosis ([place]) 07/18/2018  . Encounter for antineoplastic  immunotherapy 05/17/2018  . Port-A-Cath in place 03/27/2018  . Encounter for antineoplastic chemotherapy 02/16/2018  . 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 Goals of care, counseling/discussion 02/07/2018  . ANGIOKERATOMA, BLEEDING 10/06/2006  . DM2 (diabetes mellitus, type 2) ([place]) 10/06/2006  . HYPERLIPIDEMIA 10/06/2006  . HYPERTENSION, BENIGN 10/06/2006  . LATERAL MENISCUS TEAR, RIGHT 10/06/2006  . CHOLECYSTECTOMY, HX OF 10/06/2006   PCP:  [name], MD Pharmacy:   [facility] [phone] [address] [address], [place] AT [facility] [address] [place] [address] Phone: [phone] Fax: [phone]     Social Determinants of Health ([place]) Interventions    Readmission Risk Interventions Readmission Risk Prevention Plan 02/23/2019  Transportation Screening Complete  PCP or Specialist Appt within 3-5 Days Not Complete  Not Complete comments Not ready for dc  HRI or [place] Not Complete  HRI or Home Care Consult comments NA  Social Work Consult for [place] Planning/Counseling Not Complete  SW consult not completed comments NA  Palliative Care Screening Not Applicable  Medication Review Press photographer) Complete  Some recent data might be hidden

## 2019-03-07 NOTE — ED Notes (Signed)
2 person assist to bedside commode and reposition back in bed

## 2019-03-07 NOTE — ED Notes (Signed)
Patient on the phone with wife giving update.

## 2019-03-07 NOTE — ED Notes (Addendum)
Pt repositioned in bed after standing and transferring into impatient bed for comfort. Pt has call bell within reach and knows to call staff if assistance is needed.

## 2019-03-07 NOTE — ED Notes (Signed)
Patient is resting quietly with eyes closed and appears in no acute distress.

## 2019-03-07 NOTE — ED Notes (Signed)
Pt assisted up to [facility] and back into bed after using BSC and urinal. Pt repositioned in bed for comfort. Pt has call bell within reach and knows to call staff if assistance is needed

## 2019-03-08 ENCOUNTER — Inpatient Hospital Stay (HOSPITAL_COMMUNITY): Payer: Medicare Other

## 2019-03-08 ENCOUNTER — Encounter (HOSPITAL_COMMUNITY)
Admission: EM | Disposition: A | Discharge status: Home / Self Care | Payer: Self-pay | Source: Home / Self Care | Attending: Internal Medicine

## 2019-03-08 DIAGNOSIS — R609 Edema, unspecified: Secondary | ICD-10-CM

## 2019-03-08 LAB — TYPE AND SCREEN
ABO/RH(D): B POS
Antibody Screen: NEGATIVE
Unit division: 0
Unit division: 0
Unit division: 0

## 2019-03-08 LAB — COMPREHENSIVE METABOLIC PANEL
ALT: 26 U/L (ref 0–44)
AST: 37 U/L (ref 15–41)
Albumin: 3.2 g/dL — ABNORMAL LOW (ref 3.5–5.0)
Alkaline Phosphatase: 34 U/L — ABNORMAL LOW (ref 38–126)
Anion gap: 12 (ref 5–15)
BUN: 81 mg/dL — ABNORMAL HIGH (ref 8–23)
CO2: 25 mmol/L (ref 22–32)
Calcium: 9 mg/dL (ref 8.9–10.3)
Chloride: 105 mmol/L (ref 98–111)
Creatinine, Ser: 1.97 mg/dL — ABNORMAL HIGH (ref 0.61–1.24)
GFR calc Af Amer: 39 mL/min — ABNORMAL LOW (ref >60)
GFR calc non Af Amer: 33 mL/min — ABNORMAL LOW (ref >60)
Glucose, Bld: 115 mg/dL — ABNORMAL HIGH (ref 70–99)
Potassium: 3.2 mmol/L — ABNORMAL LOW (ref 3.5–5.1)
Sodium: 142 mmol/L (ref 135–145)
Total Bilirubin: 2 mg/dL — ABNORMAL HIGH (ref 0.3–1.2)
Total Protein: 6.7 g/dL (ref 6.5–8.1)

## 2019-03-08 LAB — CBC
HCT: 24.4 % — ABNORMAL LOW (ref 39.0–52.0)
Hemoglobin: 8 g/dL — ABNORMAL LOW (ref 13.0–17.0)
MCH: 29.6 pg (ref 26.0–34.0)
MCHC: 32.8 g/dL (ref 30.0–36.0)
MCV: 90.4 fL (ref 80.0–100.0)
Platelets: 215 10*3/uL (ref 150–400)
RBC: 2.7 MIL/uL — ABNORMAL LOW (ref 4.22–5.81)
RDW: 18.7 % — ABNORMAL HIGH (ref 11.5–15.5)
WBC: 4.2 10*3/uL (ref 4.0–10.5)
nRBC: 1.9 % — ABNORMAL HIGH (ref 0.0–0.2)

## 2019-03-08 LAB — GLUCOSE, CAPILLARY
Glucose-Capillary: 113 mg/dL — ABNORMAL HIGH (ref 70–99)
Glucose-Capillary: 112 mg/dL — ABNORMAL HIGH (ref 70–99)
Glucose-Capillary: 108 mg/dL — ABNORMAL HIGH (ref 70–99)
Glucose-Capillary: 107 mg/dL — ABNORMAL HIGH (ref 70–99)
Glucose-Capillary: 91 mg/dL (ref 70–99)
Glucose-Capillary: 107 mg/dL — ABNORMAL HIGH (ref 70–99)

## 2019-03-08 LAB — BPAM RBC
Blood Product Expiration Date: 202009102359
Blood Product Expiration Date: 202009232359
Blood Product Expiration Date: 202009232359
ISSUE DATE / TIME: 202008252321
ISSUE DATE / TIME: 202008260324
ISSUE DATE / TIME: 202008261339
Unit Type and Rh: 7300
Unit Type and Rh: 7300
Unit Type and Rh: 7300

## 2019-03-08 LAB — URINE CULTURE: Culture: <10000 — AB

## 2019-03-08 LAB — MAGNESIUM: Magnesium: 2.5 mg/dL — ABNORMAL HIGH (ref 1.7–2.4)

## 2019-03-08 SURGERY — EGD (ESOPHAGOGASTRODUODENOSCOPY)
Anesthesia: Moderate Sedation

## 2019-03-08 SURGERY — COLONOSCOPY
Anesthesia: Monitor Anesthesia Care

## 2019-03-08 MED ORDER — POTASSIUM CHLORIDE CRYS ER 20 MEQ PO TBCR
40.0000 meq | EXTENDED_RELEASE_TABLET | Freq: Every day | ORAL | Status: DC
  Administered 2019-03-08 – 2019-03-13 (×6): 40 meq via ORAL
  Filled 2019-03-08 (×6): qty 2

## 2019-03-08 MED ORDER — INSULIN ASPART 100 UNIT/ML ~~LOC~~ SOLN
0.0000 [IU] | Freq: Three times a day (TID) | SUBCUTANEOUS | Status: DC
  Administered 2019-03-11 – 2019-03-13 (×2): 2 [IU] via SUBCUTANEOUS

## 2019-03-08 MED ORDER — HYDROCODONE-ACETAMINOPHEN 5-325 MG PO TABS
1.0000 | ORAL_TABLET | Freq: Four times a day (QID) | ORAL | Status: DC | PRN
  Administered 2019-03-08 – 2019-03-09 (×3): 1 via ORAL
  Filled 2019-03-08 (×3): qty 1

## 2019-03-08 MED ORDER — PANTOPRAZOLE SODIUM 40 MG PO TBEC
40.0000 mg | DELAYED_RELEASE_TABLET | Freq: Two times a day (BID) | ORAL | Status: DC
  Administered 2019-03-08 – 2019-03-13 (×10): 40 mg via ORAL
  Filled 2019-03-08 (×10): qty 1

## 2019-03-08 MED ORDER — INSULIN ASPART 100 UNIT/ML ~~LOC~~ SOLN: 0.0000 [IU] | Freq: Every day | SUBCUTANEOUS | Status: DC

## 2019-03-08 MED ORDER — IPRATROPIUM-ALBUTEROL 0.5-2.5 (3) MG/3ML IN SOLN
3.0000 mL | Freq: Two times a day (BID) | RESPIRATORY_TRACT | Status: DC
  Administered 2019-03-08 – 2019-03-09 (×3): 3 mL via RESPIRATORY_TRACT
  Filled 2019-03-08 (×4): qty 3

## 2019-03-08 MED ORDER — FUROSEMIDE 10 MG/ML IJ SOLN
20.0000 mg | Freq: Two times a day (BID) | INTRAMUSCULAR | Status: DC
  Administered 2019-03-08 – 2019-03-09 (×3): 20 mg via INTRAVENOUS
  Filled 2019-03-08 (×3): qty 2

## 2019-03-08 NOTE — Plan of Care (Signed)
  Problem: Activity: Goal: Risk for activity intolerance will decrease Outcome: Progressing   Problem: Bowel/Gastric: Goal: Will show no signs and symptoms of gastrointestinal bleeding Outcome: Progressing

## 2019-03-08 NOTE — Progress Notes (Signed)
Report received from [facility], [place]. I agree with RN's previous assessment. Will continue to monitor pt closely. Darrell [name] I

## 2019-03-08 NOTE — Consult Note (Signed)
[facility] CONSULT Reason for consult: Anemia Referring Physician: [facility] hospitalist.  PCP: Dr. Valora Piccolo.  Primary GI: [facility] down in [place] is an 73 y.o. male.  HPI: I saw him back in 2008 and performed colonoscopy removed a small adenomatous polyp.  He did not respond to a 5-year repeat letter but apparently in 2014 or 15 had transferred [facility] care to [facility] and according to his history had colonoscopy that he was told was negative and he did not need to have repeated for 10 years.  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  He has been treated with radiation and with chemotherapy over the past year and has had a persistent right pleural effusion requiring permanent PleurX catheter in the right pleural space to drain the effusion.  This apparently has had to be replaced several times by IR.  He is receiving some type of immunotherapy from Dr. Earlie Server.  He has had recurrent swelling of his lower extremities and has been on diuretics for congestive heart failure.  His other problems have included hypertension, type 2 diabetes with chronic kidney disease and history of cellulitis of the lower extremities.  He also has had new onset PAF with RVR was evaluated by cardiology it was felt this was not really PAF and he did not need anticoagulation.  He had been started on Eliquis which was subsequently stopped and he was sent home on Coreg.  He recently had a fall and fractured his left humerus causing quite a bit of pain.  He has been on oxycodone which caused a rash and has been taking a lot of Advil/ibuprofen.  He denies melena.  He has not been on any medication for acid suppression. He was admitted 2 days ago with shortness of breath.  He has opacities in the right lung field some abdominal swelling treated with diuretics.  Hemoglobin was 5.0 on admission and his stools were positive.  He has been started on  Protonix and states that he feels fine is having no abdominal pain, hematochezia or melena.  We were asked to see him to consider colonoscopy and endoscopy.  Past Medical History:  Diagnosis Date  . Aortic atherosclerosis ([place]) 07/18/2018  . Cancer ([place])   . Cellulitis of right lower extremity   . Chronic kidney disease   . Diabetes mellitus without complication ([place])   . Dyslipidemia   . Hypertension   . Thoracic aortic aneurysm without rupture ([place]) 07/18/2018    Past Surgical History:  Procedure Laterality Date  . IR GUIDED DRAIN W CATHETER PLACEMENT  11/15/2018  . IR GUIDED DRAIN W CATHETER PLACEMENT  01/31/2019  . IR INSTILL VIA CHEST TUBE AGENT FOR FIBRINOLYSIS INI DAY  01/23/2019  . IR REMOVAL OF PLURAL CATH W/CUFF  01/31/2019  . IR THORACENTESIS ASP PLEURAL SPACE W/IMG GUIDE  09/05/2018  . IR THORACENTESIS ASP PLEURAL SPACE W/IMG GUIDE  10/11/2018  . IR THORACENTESIS ASP PLEURAL SPACE W/IMG GUIDE  10/30/2018  . PORTACATH PLACEMENT Left 02/21/2018   Procedure: INSERTION PORT-A-CATH;  Surgeon: Grace Isaac, MD;  Location: [facility];  Service: Thoracic;  Laterality: Left;    Family History  Problem Relation Age of Onset  . Breast cancer Mother   . CAD Father     Social History:  reports that he quit smoking about 22 years ago. His smoking use included cigarettes. He has a 70.00 pack-year smoking history. He has never  used smokeless tobacco. He reports current alcohol use. He reports that he does not use drugs.  Allergies:  Allergies  Allergen Reactions  . Percocet [Oxycodone-Acetaminophen] Itching  . Lisinopril Cough         Medications; Prior to Admission medications   Medication Sig Start Date End Date Taking? Authorizing Provider  albuterol (PROVENTIL HFA;VENTOLIN HFA) 108 (90 Base) MCG/ACT inhaler Inhale 2 puffs into the lungs every 4 (four) hours as needed for wheezing or shortness of breath. 08/03/18  Yes Byrum, Rose Fillers, MD  carvedilol (COREG) 6.25 MG tablet Take 6.25  mg by mouth 2 (two) times daily with a meal.  07/19/16  Yes [provider]  cloNIDine (CATAPRES) 0.1 MG tablet Take 1 tablet (0.1 mg total) by mouth daily. 02/28/19  Yes Eugenie Filler, MD  fenofibrate 160 MG tablet Take 160 mg by mouth daily.  11/13/17  Yes [provider]  furosemide (LASIX) 40 MG tablet Take 1 tablet (40 mg total) by mouth 2 (two) times daily. 02/28/19  Yes Eugenie Filler, MD  Insulin Detemir (LEVEMIR FLEXTOUCH) 100 UNIT/ML Pen Inject 10 Units into the skin daily.  07/18/15  Yes [provider]  lidocaine-prilocaine (EMLA) cream Apply 1 application topically as needed. Patient taking differently: Apply 1 application topically as needed (port).  02/16/18  Yes Curt Bears, MD  Multiple Vitamin (MULTI-VITAMINS) TABS Take 1 tablet by mouth daily.    Yes [provider]  nystatin (MYCOSTATIN/NYSTOP) powder Apply topically 3 (three) times daily. 02/28/19  Yes Eugenie Filler, MD  Omega-3 1000 MG CAPS Take 1,200 mg by mouth 2 (two) times daily.    Yes [provider]  polyethylene glycol (MIRALAX / GLYCOLAX) 17 g packet Take 17 g by mouth daily. 03/01/19  Yes Eugenie Filler, MD  potassium chloride 20 MEQ TBCR Take 20 mEq by mouth daily for 7 days. 02/28/19 03/07/19 Yes Eugenie Filler, MD  pravastatin (PRAVACHOL) 40 MG tablet Take 40 mg by mouth daily.  01/08/18  Yes [provider]  prochlorperazine (COMPAZINE) 10 MG tablet TAKE 1 TABLET BY MOUTH EVERY 6 HOURS AS NEEDED FOR NAUSEA OR VOMITING Patient taking differently: Take 10 mg by mouth every 6 (six) hours as needed for nausea or vomiting.  02/21/18  Yes Curt Bears, MD  senna-docusate (SENOKOT-S) 8.6-50 MG tablet Take 2 tablets by mouth 2 (two) times daily. 02/28/19  Yes Eugenie Filler, MD  traMADol (ULTRAM) 50 MG tablet Take 1 tablet (50 mg total) by mouth every 6 (six) hours as needed for severe pain. 06/23/18  Yes Curt Bears, MD  Insulin Pen Needle  (FIFTY50 PEN NEEDLES) 31G X 8 MM MISC USE AS DIRECTED 06/30/15   [provider]  oxyCODONE-acetaminophen (PERCOCET/ROXICET) 5-325 MG tablet Take 1 tablet by mouth every 4 (four) hours as needed for severe pain. Patient not taking: Reported on 03/06/2019 02/14/19   Marybelle Killings, MD   . furosemide  40 mg Intravenous BID  . insulin aspart  0-9 Units Subcutaneous Q4H  . pantoprazole  40 mg Intravenous Q12H  . sodium chloride flush  10-40 mL Intracatheter Q12H   PRN Meds albuterol, hydrOXYzine, morphine injection, sodium chloride flush, traMADol Results for orders placed or performed during the hospital encounter of 03/06/19 (from the past 48 hour(s))  CBC with Differential     Status: Abnormal   Collection Time: 03/06/19  8:21 PM  Result Value Ref Range   WBC 5.9 4.0 - 10.5 K/uL   RBC  1.69 (L) 4.22 - 5.81 MIL/uL   Hemoglobin 5.0 (LL) 13.0 - 17.0 g/dL    Comment: This critical result has verified and been called to RN J OXENDAUN by Alda Lea on 08 25 2020 at 2209, and has been read back.    HCT 15.7 (L) 39.0 - 52.0 %   MCV 92.9 80.0 - 100.0 fL   MCH 29.6 26.0 - 34.0 pg   MCHC 31.8 30.0 - 36.0 g/dL   RDW 19.4 (H) 11.5 - 15.5 %   Platelets 191 150 - 400 K/uL   nRBC 1.5 (H) 0.0 - 0.2 %   Neutrophils Relative % 84 %   Neutro Abs 4.9 1.7 - 7.7 K/uL   Lymphocytes Relative 5 %   Lymphs Abs 0.3 (L) 0.7 - 4.0 K/uL   Monocytes Relative 9 %   Monocytes Absolute 0.5 0.1 - 1.0 K/uL   Eosinophils Relative 2 %   Eosinophils Absolute 0.1 0.0 - 0.5 K/uL   Basophils Relative 0 %   Basophils Absolute 0.0 0.0 - 0.1 K/uL   Immature Granulocytes 0 %   Abs Immature Granulocytes 0.02 0.00 - 0.07 K/uL    Comment: Performed at [facility], [place] [address] [place], [place] [address]  Comprehensive metabolic panel     Status: Abnormal   Collection Time: 03/06/19  8:21 PM  Result Value Ref Range   Sodium 136 135 - 145 mmol/L   Potassium 3.6 3.5 - 5.1 mmol/L   Chloride  102 98 - 111 mmol/L   CO2 23 22 - 32 mmol/L   Glucose, Bld 154 (H) 70 - 99 mg/dL   BUN 98 (H) 8 - 23 mg/dL   Creatinine, Ser 2.22 (H) 0.61 - 1.24 mg/dL   Calcium 8.8 (L) 8.9 - 10.3 mg/dL   Total Protein 6.1 (L) 6.5 - 8.1 g/dL   Albumin 3.1 (L) 3.5 - 5.0 g/dL   AST 46 (H) 15 - 41 U/L   ALT 26 0 - 44 U/L   Alkaline Phosphatase 33 (L) 38 - 126 U/L   Total Bilirubin 1.2 0.3 - 1.2 mg/dL   GFR calc non Af Amer 29 (L) >60 mL/min   GFR calc Af Amer 34 (L) >60 mL/min   Anion gap 11 5 - 15    Comment: Performed at [facility], [place] [address] [place], [place] [address]  Type and screen [place]     Status: None (Preliminary result)   Collection Time: 03/06/19  8:21 PM  Result Value Ref Range   ABO/RH(D) B POS    Antibody Screen NEG    Sample Expiration 03/09/2019,2359    Unit Number [id number]    Blood Component Type RED CELLS,LR    Unit division 00    Status of Unit ISSUED    Transfusion Status OK TO TRANSFUSE    Crossmatch Result Compatible    Unit Number [id number]    Blood Component Type RED CELLS,LR    Unit division 00    Status of Unit ISSUED,FINAL    Transfusion Status OK TO TRANSFUSE    Crossmatch Result Compatible    Unit Number [id number]    Blood Component Type RED CELLS,LR    Unit division 00    Status of Unit ISSUED    Transfusion Status OK TO TRANSFUSE    Crossmatch Result      Compatible Performed at [place] [address], [place],  [address]   Brain  natriuretic peptide     Status: Abnormal   Collection Time: 03/06/19  8:22 PM  Result Value Ref Range   B Natriuretic Peptide 334.3 (H) 0.0 - 100.0 pg/mL    Comment: Performed at [facility], [place] [address] [place], [place] [address]  Urinalysis, Routine w reflex microscopic     Status: Abnormal   Collection Time: 03/06/19 10:16 PM  Result Value Ref Range   Color, Urine YELLOW YELLOW   APPearance CLEAR CLEAR    Specific Gravity, Urine 1.013 1.005 - 1.030   pH 5.0 5.0 - 8.0   Glucose, UA NEGATIVE NEGATIVE mg/dL   Hgb urine dipstick NEGATIVE NEGATIVE   Bilirubin Urine NEGATIVE NEGATIVE   Ketones, ur NEGATIVE NEGATIVE mg/dL   Protein, ur NEGATIVE NEGATIVE mg/dL   Nitrite NEGATIVE NEGATIVE   Leukocytes,Ua MODERATE (A) NEGATIVE   RBC / HPF 0-5 0 - 5 RBC/hpf   WBC, UA 0-5 0 - 5 WBC/hpf   Bacteria, UA RARE (A) NONE SEEN   Squamous Epithelial / LPF 0-5 0 - 5   Mucus PRESENT     Comment: Performed at [facility], [place] [address] [place], [place] [address]  Urine culture     Status: Abnormal   Collection Time: 03/06/19 10:16 PM   Specimen: Urine, Random  Result Value Ref Range   Specimen Description      URINE, RANDOM Performed at [place] [address] [place], [place] [address]    Special Requests      NONE Performed at [facility], [place] [address], [place], [place] [address]    Culture (A)     <10,000 COLONIES/mL INSIGNIFICANT GROWTH Performed at [facility] [address], [place], [place] [address]    Report Status 03/08/2019 FINAL   Prepare RBC     Status: None   Collection Time: 03/06/19 10:20 PM  Result Value Ref Range   Order Confirmation      ORDER PROCESSED BY BLOOD BANK Performed at [facility], [place] [address] [place], [place] [address]   POC occult blood, ED     Status: Abnormal   Collection Time: 03/06/19 10:40 PM  Result Value Ref Range   Fecal Occult Bld POSITIVE (A) NEGATIVE  Protime-INR     Status: Abnormal   Collection Time: 03/06/19 10:58 PM  Result Value Ref Range   Prothrombin Time 16.3 (H) 11.4 - 15.2 seconds   INR 1.3 (H) 0.8 - 1.2    Comment: (NOTE) INR goal varies based on device and disease states. Performed at [facility], [place] [address] [place], [place] [address]   SARS CORONAVIRUS 2 (TAT 6-12 HRS) Nasal Swab     Status: None   Collection  Time: 03/06/19 11:01 PM   Specimen: Nasal Swab  Result Value Ref Range   SARS Coronavirus 2 NEGATIVE NEGATIVE    Comment: (NOTE) SARS-CoV-2 target nucleic acids are NOT DETECTED. The SARS-CoV-2 RNA is generally detectable in upper and lower respiratory specimens during the acute phase of infection. Negative results do not preclude SARS-CoV-2 infection, do not rule out co-infections with other pathogens, and should not be used as the sole basis for treatment or other patient management decisions. Negative results must be combined with clinical observations, patient history, and epidemiological information. The expected result is Negative. Fact Sheet for Patients: SugarRoll.be Fact Sheet for Healthcare Providers: https://www.woods-mathews.com/ This test is not yet approved or cleared by the [place]  States FDA and  has been authorized for detection and/or diagnosis of SARS-CoV-2 by FDA under an Emergency Use Authorization (EUA). This EUA will remain  in effect (meaning this test can be used) for the duration of the COVID-19 declaration under Section 56 4(b)(1) of the Act, 21 U.S.C. section 360bbb-3(b)(1), unless the authorization is terminated or revoked sooner. Performed at [facility], [place] [address] [place], [place] [address]   CBG monitoring, ED     Status: Abnormal   Collection Time: 03/07/19  1:23 AM  Result Value Ref Range   Glucose-Capillary 145 (H) 70 - 99 mg/dL  CBG monitoring, ED     Status: Abnormal   Collection Time: 03/07/19  3:34 AM  Result Value Ref Range   Glucose-Capillary 130 (H) 70 - 99 mg/dL  Hemoglobin and hematocrit, blood     Status: Abnormal   Collection Time: 03/07/19  6:54 AM  Result Value Ref Range   Hemoglobin 7.0 (L) 13.0 - 17.0 g/dL    Comment: REPEATED TO VERIFY POST TRANSFUSION SPECIMEN DELTA CHECK NOTED    HCT 21.0 (L) 39.0 - 52.0 %    Comment: Performed at [facility], [place] [address], [place], [place] [address]  Basic metabolic panel     Status: Abnormal   Collection Time: 03/07/19  6:54 AM  Result Value Ref Range   Sodium 137 135 - 145 mmol/L   Potassium 3.5 3.5 - 5.1 mmol/L   Chloride 104 98 - 111 mmol/L   CO2 26 22 - 32 mmol/L   Glucose, Bld 123 (H) 70 - 99 mg/dL   BUN 93 (H) 8 - 23 mg/dL   Creatinine, Ser 2.19 (H) 0.61 - 1.24 mg/dL   Calcium 8.8 (L) 8.9 - 10.3 mg/dL   GFR calc non Af Amer 29 (L) >60 mL/min   GFR calc Af Amer 34 (L) >60 mL/min   Anion gap 7 5 - 15    Comment: Performed at [facility], [place] [address], [place], [place] [address]  Procalcitonin - Baseline     Status: None   Collection Time: 03/07/19  6:54 AM  Result Value Ref Range   Procalcitonin <0.10 ng/mL    Comment:        Interpretation: PCT (Procalcitonin) <= 0.5 ng/mL: Systemic infection (sepsis) is not likely. Local bacterial infection is possible. (NOTE)       Sepsis PCT Algorithm           Lower Respiratory Tract                                      Infection PCT Algorithm    ----------------------------     ----------------------------         PCT < 0.25 ng/mL                PCT < 0.10 ng/mL         Strongly encourage             Strongly discourage   discontinuation of antibiotics    initiation of antibiotics    ----------------------------     -----------------------------       PCT 0.25 - 0.50 ng/mL            PCT 0.10 - 0.25 ng/mL               OR       >  80% decrease in PCT            Discourage initiation of                                            antibiotics      Encourage discontinuation           of antibiotics    ----------------------------     -----------------------------         PCT >= 0.50 ng/mL              PCT 0.26 - 0.50 ng/mL               AND        <80% decrease in PCT             Encourage initiation of                                             antibiotics       Encourage continuation           of antibiotics     ----------------------------     -----------------------------        PCT >= 0.50 ng/mL                  PCT > 0.50 ng/mL               AND         increase in PCT                  Strongly encourage                                      initiation of antibiotics    Strongly encourage escalation           of antibiotics                                     -----------------------------                                           PCT <= 0.25 ng/mL                                                 OR                                        > 80% decrease in PCT                                     Discontinue / Do not initiate  antibiotics Performed at [facility], [place] [address], [place], [place] [address]   CBG monitoring, ED     Status: Abnormal   Collection Time: 03/07/19  7:50 AM  Result Value Ref Range   Glucose-Capillary 115 (H) 70 - 99 mg/dL  CBC     Status: Abnormal   Collection Time: 03/07/19  9:36 AM  Result Value Ref Range   WBC 4.7 4.0 - 10.5 K/uL   RBC 2.31 (L) 4.22 - 5.81 MIL/uL   Hemoglobin 6.9 (LL) 13.0 - 17.0 g/dL    Comment: This critical result has verified and been called to [place] by Raelyn Ensign on 08 26 2020 at 0957, and has been read back. CRITICAL RESULT VERIFIED   HCT 20.8 (L) 39.0 - 52.0 %   MCV 90.0 80.0 - 100.0 fL   MCH 29.9 26.0 - 34.0 pg   MCHC 33.2 30.0 - 36.0 g/dL   RDW 17.8 (H) 11.5 - 15.5 %   Platelets 192 150 - 400 K/uL   nRBC 1.9 (H) 0.0 - 0.2 %    Comment: Performed at [facility], [place] [address], [place], [place] [address]  Prepare RBC     Status: None   Collection Time: 03/07/19 12:04 PM  Result Value Ref Range   Order Confirmation      ORDER PROCESSED BY BLOOD BANK Performed at [facility], [place] [address] [place], [place] [address]   Glucose, capillary     Status: Abnormal   Collection Time: 03/07/19 12:30 PM  Result Value Ref Range    Glucose-Capillary 122 (H) 70 - 99 mg/dL  Glucose, capillary     Status: Abnormal   Collection Time: 03/07/19  4:56 PM  Result Value Ref Range   Glucose-Capillary 114 (H) 70 - 99 mg/dL  CBC     Status: Abnormal   Collection Time: 03/07/19  6:52 PM  Result Value Ref Range   WBC 4.2 4.0 - 10.5 K/uL   RBC 2.67 (L) 4.22 - 5.81 MIL/uL   Hemoglobin 7.7 (L) 13.0 - 17.0 g/dL   HCT 24.0 (L) 39.0 - 52.0 %   MCV 89.9 80.0 - 100.0 fL   MCH 28.8 26.0 - 34.0 pg   MCHC 32.1 30.0 - 36.0 g/dL   RDW 18.2 (H) 11.5 - 15.5 %   Platelets 202 150 - 400 K/uL   nRBC 2.1 (H) 0.0 - 0.2 %    Comment: Performed at [facility], [place] [address] [place],  [address]  Glucose, capillary     Status: Abnormal   Collection Time: 03/07/19  8:17 PM  Result Value Ref Range   Glucose-Capillary 103 (H) 70 - 99 mg/dL  Glucose, capillary     Status: Abnormal   Collection Time: 03/08/19  1:03 AM  Result Value Ref Range   Glucose-Capillary 113 (H) 70 - 99 mg/dL  Glucose, capillary     Status: Abnormal   Collection Time: 03/08/19  4:50 AM  Result Value Ref Range   Glucose-Capillary 112 (H) 70 - 99 mg/dL  CBC     Status: Abnormal   Collection Time: 03/08/19  4:58 AM  Result Value Ref Range   WBC 4.2 4.0 - 10.5 K/uL   RBC 2.70 (L) 4.22 - 5.81 MIL/uL   Hemoglobin 8.0 (L) 13.0 - 17.0 g/dL   HCT 24.4 (L) 39.0 - 52.0 %   MCV 90.4 80.0 - 100.0 fL   MCH 29.6 26.0 -  34.0 pg   MCHC 32.8 30.0 - 36.0 g/dL   RDW 18.7 (H) 11.5 - 15.5 %   Platelets 215 150 - 400 K/uL   nRBC 1.9 (H) 0.0 - 0.2 %    Comment: Performed at [facility], [place] [address], [place], [place] [address]  Comprehensive metabolic panel     Status: Abnormal   Collection Time: 03/08/19  4:58 AM  Result Value Ref Range   Sodium 142 135 - 145 mmol/L   Potassium 3.2 (L) 3.5 - 5.1 mmol/L   Chloride 105 98 - 111 mmol/L   CO2 25 22 - 32 mmol/L   Glucose, Bld 115 (H) 70 - 99 mg/dL   BUN 81 (H) 8 - 23 mg/dL    Creatinine, Ser 1.97 (H) 0.61 - 1.24 mg/dL   Calcium 9.0 8.9 - 10.3 mg/dL   Total Protein 6.7 6.5 - 8.1 g/dL   Albumin 3.2 (L) 3.5 - 5.0 g/dL   AST 37 15 - 41 U/L   ALT 26 0 - 44 U/L   Alkaline Phosphatase 34 (L) 38 - 126 U/L   Total Bilirubin 2.0 (H) 0.3 - 1.2 mg/dL   GFR calc non Af Amer 33 (L) >60 mL/min   GFR calc Af Amer 39 (L) >60 mL/min   Anion gap 12 5 - 15    Comment: Performed at [facility], [place] [address] [place], [place] [address]  Magnesium     Status: Abnormal   Collection Time: 03/08/19  4:58 AM  Result Value Ref Range   Magnesium 2.5 (H) 1.7 - 2.4 mg/dL    Comment: Performed at [facility], [place] [address], [place],  [address]    Dg Chest Portable 1 View  Addendum Date: 03/06/2019   ADDENDUM REPORT: 03/06/2019 20:49 ADDENDUM: These results were called by telephone at the time of interpretation on 03/06/2019 at 8:49 pm to Dr. Coral Ceo , who verbally acknowledged these results. Electronically Signed   By: Lovena Le M.D.   On: 03/06/2019 20:49   Result Date: 03/06/2019 CLINICAL DATA:  Shortness of breath, history of lung cancer, shortness of breath for 3 days upper and lower extremity edema. EXAM: PORTABLE CHEST 1 VIEW COMPARISON:  Radiograph 01/31/2019 FINDINGS: Left subclavian approach Port-A-Cath tip terminates at the right atrium. Right chest tube is positioned in the right lung base. There is persistent consolidative opacity in the right perihilar region with a moderate right pleural effusion which layers posteriorly resulting in a gradient density of the right hemithorax. The left lung remains largely clear. Cardiomediastinal contours are unchanged from prior. There is a new, comminuted fracture of the proximal left humerus which appears to extend through the surgical and anatomic necks involving the greater tuberosity. Inferior positioning of the humeral head relative to the left glenoid may reflect a left joint  effusion. IMPRESSION: Redemonstrated consolidative opacity in the right mid lung with moderate right pleural effusion despite a right pleural drain in place. Comminuted fracture of the proximal left humerus. Inferior subluxation of the humeral head may reflect joint effusion. Electronically Signed: By: Lovena Le M.D. On: 03/06/2019 20:44               Blood pressure 132/66, pulse 81, temperature 98 F (36.7 C), temperature source Oral, resp. rate 18, height 5\' 8"  (1.727 m), weight 118.4 kg, SpO2 98 %.  Physical exam:   General--somewhat obese white male ENT--nonicteric Neck--supple somewhat obese Heart--regular rate and rhythm  lungs--clear  on the left but diminished breath sounds on the right Abdomen--obese but completely nontender Psych--alert and oriented answers questions appropriately Extremities- slight edema of both lower extremities   Assessment: 1.  Anemia/guaiac positive stool.  This more than likely is multifactorial probably exacerbated by his recent NSAID use for his fractured shoulder.  He is not actively bleeding.  Due to his indwelling catheter in the pleural space to drain chronic effusion I really do not think he would be a candidate for elective endoscopic procedures.  I think empiric treatment with PPI would be best. 2.  History of unresectable squamous cell carcinoma the right lung.  He has been treated with radiation, chemo and now immunotherapy. 3.  Chronic right pleural effusion requiring PleurX catheter in the pleural space chronically 4.  History of congestive heart failure  Plan: 1.  At this point I would treat him with pantoprazole indefinitely on the assumption that he has had some bleeding due to all the NSAIDs.  Will transfuse as needed. 2.  Should he have active bleeding we could consider emergent EGD.  I think colonoscopy would be quite risky given his other medical problems and would defer this until he may improve. 3.  Would go ahead and give  him diet of choice.  We will not follow him on a regular basis but will be available if he has problems.  Please call us if needed.   Nancy Fetter 03/08/2019, 7:13 AM   This note was created using voice recognition software and minor errors may Have occurred unintentionally. Pager: [phone] If no answer or after hours call [phone]

## 2019-03-08 NOTE — Progress Notes (Signed)
Patient reports feeling SOB, and request breathing treatment. Oxygen assessed on room air at 98 %. RT contacted.

## 2019-03-08 NOTE — Progress Notes (Signed)
IR requested by Dr. Posey Pronto for management of right pleurX catheter malfunction.  Patient with history of lung cancer with recurrent malignant right pleural effusion s/p tunneled right pleural pleurX catheter placement 11/15/2018 by Dr. Annamaria Boots. His pleural effusion is complex and has not been draining well- he underwent two tPA dwells (01/10/2019 and 01/23/2019) along with a right pleurX exchange 01/31/2019 by Dr. Earleen Newport. He presented to [facility] ED 03/06/2019 with complaint of dyspnea. In addition, states that his pleurX has been draining less fluid than normal.  Patient assessed by IR yesterday. Right pleurX was connected to drainage system with removal of 30 mL bloody fluid with difficulty. Plan at that time was to repeat CXR this AM.  CXR this AM: 1. Small right pleural effusion. Persistent right perihilar airspace disease unchanged compared with 03/06/2019.  CXR this AM reviewed by Dr. Kathlene Cote who states that there is too little fluid to drain. Recommend continued pleurX drainage PRN. No additional interventions planned by IR at this time- will delete order. If issues with pleurX arise, please re-consult IR for pleurX evaluation. Dr. Posey Pronto made aware of above.  Please call IR with questions/concerns.   Darrell Graff Jalexa Pifer, PA-C 03/08/2019, 1:51 PM

## 2019-03-08 NOTE — Progress Notes (Signed)
VASCULAR LAB PRELIMINARY  PRELIMINARY  PRELIMINARY  PRELIMINARY  Left upper extremity venous duplex completed.    Preliminary report:  See CV proc for preliminary results.   Gave report to Dr. Jenkins Rouge, [facility], RVT 03/08/2019, 10:43 AM

## 2019-03-08 NOTE — Progress Notes (Signed)
[facility] Progress Note  Patient: Darrell [name] SAY:[id number]   PCP: [name], MD DOB: 09-11-1945   DOA: 03/06/2019   DOS: 03/08/2019   Date of Service: the patient was seen and examined on 03/08/2019  Brief hospital course: Pt. 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 [name] from oncology and currently on immunotherapy, recurrent pleural effusion with right-sided Pleurx catheter, CKD stage III, type 2 diabetes, hypertension, hyperlipidemia, thoracic aortic aneurysm, recent left proximal humerus fracture; admitted on 03/06/2019, presented with complaint of shortness of breath, was found to have symptomatic anemia as well as recurrent pleural effusion and acute on chronic CHF Currently further plan is continue IV diuresis and transfusion.  Further work-up with GI.Marland Kitchen  Subjective: Improvement shortnes of breath.   Assessment and Plan: Symptomatic anemia. Baseline hemoglobin 8.8. Presented with hemoglobin of 5.0. S/P 3 PRBC Monitor on telemetry. Provide IV Lasix. Continue Protonix every 12 hours. [facility] recommends conservative management without any intervention for now.  Acute on chronic diastolic CHF. BNP 334, improved since labs done 12 days ago.  Echo done February 23, 2019 showing normal systolic function with EF 60 to 23% and diastolic function could not be assessed. EKG without acute ischemic changes. -Management of anemia as mentioned below -IV Lasix 40 mg  twice daily. -Low suspicion for pneumonia.  -Continuous pulse ox, supplemental oxygen if needed  Recurrent right pleural effusion. S/P pleural catheter placement IR consulted since there is more fluid collection. IR feels that the fluid collection is stable.  Repeat chest x-ray shows actually no fluid.  No further work-up. Appreciate their assistance.  Left upper extremity DVT. At present we will manage this conservatively given patient's presentation  with acute upper GI bleed life-threatening low hemoglobin. Repeat upper extremity Doppler in 48 hours. If there is evidence of progression he may require IVC filter placement.  Abnormal urinalysis UA with moderate amount of leukocytes, 0-5 WBCs, rare bacteria, and negative nitrite.  Afebrile and no leukocytosis.  Patient is not endorsing any UTI symptoms. -Urine culture pending  CKD stage III -Creatinine 2.2, stable since labs done 6 days ago. 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 [name] from oncology and currently on immunotherapy. Has recurrent pleural effusion with right-sided Pleurx catheter. -Oncology follow-up  Well-controlled insulin-dependent diabetes mellitus -A1c 5.8 on 8/14.  Sliding scale insulin sensitive every 4 hours as patient is currently n.p.o. CBG checks.  Hypertension -Hold off home antihypertensives at this time given soft blood pressure and concern for acute upper GI bleed.  Body mass index is 39.69 kg/m.   Diet: NPO except medications for active GI bleed DVT Prophylaxis: SCD, pharmacological prophylaxis contraindicated due to Active GI bleed  Advance goals of care discussion: Full code  Family Communication: family was present at bedside, at the time of interview. The pt provided permission to discuss medical plan with the family. Opportunity was given to ask question and all questions were answered satisfactorily.   Disposition:  Discharge to Home .  Consultants: gastroenterology [facility] Procedures: none  Scheduled Meds: . furosemide  20 mg Intravenous BID  . insulin aspart  0-15 Units Subcutaneous TID WC  . insulin aspart  0-5 Units Subcutaneous QHS  . ipratropium-albuterol  3 mL Nebulization BID  . pantoprazole  40 mg Oral BID AC  . potassium chloride  40 mEq Oral Daily  . sodium chloride flush  10-40 mL  Intracatheter Q12H   Continuous Infusions: PRN Meds: albuterol,  HYDROcodone-acetaminophen, hydrOXYzine, sodium chloride flush Antibiotics: Anti-infectives (From admission, onward)   Start     Dose/Rate Route Frequency Ordered Stop   03/07/19 0030  cefTRIAXone (ROCEPHIN) 1 g in sodium chloride 0.9 % 100 mL IVPB  Status:  Discontinued     1 g 200 mL/hr over 30 Minutes Intravenous Daily at bedtime 03/07/19 0016 03/07/19 0046   03/07/19 0030  azithromycin (ZITHROMAX) 500 mg in sodium chloride 0.9 % 250 mL IVPB  Status:  Discontinued     500 mg 250 mL/hr over 60 Minutes Intravenous Daily at bedtime 03/07/19 0016 03/07/19 0046       Objective: Physical Exam: Vitals:   03/08/19 0435 03/08/19 0557 03/08/19 1306 03/08/19 1943  BP: 132/66  133/67   Pulse: 81  80   Resp:   17   Temp: 98 F (36.7 C)  98 F (36.7 C)   TempSrc: Oral  Oral   SpO2: 98%  96% 98%  Weight:  118.4 kg    Height:        Intake/Output Summary (Last 24 hours) at 03/08/2019 1957 Last data filed at 03/08/2019 1952 Gross per 24 hour  Intake 960 ml  Output 1150 ml  Net -190 ml   Filed Weights   03/06/19 1935 03/07/19 1223 03/08/19 0557  Weight: 121.6 kg 120.2 kg 118.4 kg   General: alert and oriented to time and place. Appear in moderate distress, affect appropriate Eyes: PERRL, Conjunctiva normal ENT: Oral Mucosa Clear, moist  Neck: no JVD, no Abnormal Mass Or lumps Cardiovascular: S1 and S2 Present, no Murmur, peripheral pulses symmetrical Respiratory: increased respiratory effort, Bilateral Air entry equal and Decreased, no use of accessory muscle, bilateral  Crackles, no wheezes Abdomen: Bowel Sound present, Soft and no tenderness, no hernia Skin: no rashes  Extremities: bilateral  Pedal edema, no calf tenderness, left upper extremity swelling as well. Neurologic: normal without focal findings, mental status, speech normal, alert and oriented x3, PERLA, Motor strength 5/5 and symmetric and sensation grossly normal to light touch Gait not checked due to patient safety  concerns  Data Reviewed: CBC: Recent Labs  Lab 03/06/19 2021 03/07/19 0654 03/07/19 0936 03/07/19 1852 03/08/19 0458  WBC 5.9  --  4.7 4.2 4.2  NEUTROABS 4.9  --   --   --   --   HGB 5.0* 7.0* 6.9* 7.7* 8.0*  HCT 15.7* 21.0* 20.8* 24.0* 24.4*  MCV 92.9  --  90.0 89.9 90.4  PLT 191  --  192 202 725   Basic Metabolic Panel: Recent Labs  Lab 03/06/19 2021 03/07/19 0654 03/08/19 0458  NA 136 137 142  K 3.6 3.5 3.2*  CL 102 104 105  CO2 23 26 25   GLUCOSE 154* 123* 115*  BUN 98* 93* 81*  CREATININE 2.22* 2.19* 1.97*  CALCIUM 8.8* 8.8* 9.0  MG  --   --  2.5*    Liver Function Tests: Recent Labs  Lab 03/06/19 2021 03/08/19 0458  AST 46* 37  ALT 26 26  ALKPHOS 33* 34*  BILITOT 1.2 2.0*  PROT 6.1* 6.7  ALBUMIN 3.1* 3.2*   No results for input(s): LIPASE, AMYLASE in the last 168 hours. No results for input(s): AMMONIA in the last 168 hours. Coagulation Profile: Recent Labs  Lab 03/06/19 2258  INR 1.3*   Cardiac Enzymes: No results for input(s): CKTOTAL, CKMB, CKMBINDEX, TROPONINI in the last 168 hours. BNP (last 3 results) No results  for input(s): PROBNP in the last 8760 hours. CBG: Recent Labs  Lab 03/08/19 0103 03/08/19 0450 03/08/19 0736 03/08/19 1202 03/08/19 1631  GLUCAP 113* 112* 108* 107* 91   Studies: Dg Chest 2 View  Result Date: 03/08/2019 CLINICAL DATA:  Shortness of breath EXAM: CHEST - 2 VIEW COMPARISON:  03/06/2019 FINDINGS: Small right pleural effusion. Right perihilar airspace disease unchanged from the prior exam. Left lung is clear. No pneumothorax. Stable heart size. Left-sided Port-A-Cath in satisfactory position. IMPRESSION: 1. Small right pleural effusion. Persistent right perihilar airspace disease unchanged compared with 03/06/2019. Electronically Signed   By: Kathreen Devoid   On: 03/08/2019 12:19   Vas [place] Upper Extremity Venous Duplex  Result Date: 03/08/2019 UPPER VENOUS STUDY  Indications: Pain, and Left arm is broken  Limitations: Broken arm, unable to move arm. Comparison Study: Prior study from 02/26/19 is available for comparison Performing Technologist: Sharion Dove RVS  Examination Guidelines: A complete evaluation includes B-mode imaging, spectral Doppler, color Doppler, and power Doppler as needed of all accessible portions of each vessel. Bilateral testing is considered an integral part of a complete examination. Limited examinations for reoccurring indications may be performed as noted.  Right Findings: +----------+------------+---------+-----------+----------+-------+ RIGHT     CompressiblePhasicitySpontaneousPropertiesSummary +----------+------------+---------+-----------+----------+-------+ Subclavian               Yes       Yes                      +----------+------------+---------+-----------+----------+-------+  Left Findings: +----------+------------+---------+-----------+----------+--------------+ LEFT      CompressiblePhasicitySpontaneousProperties   Summary     +----------+------------+---------+-----------+----------+--------------+ IJV                      Yes       Yes                             +----------+------------+---------+-----------+----------+--------------+ Subclavian               Yes       Yes                             +----------+------------+---------+-----------+----------+--------------+ Axillary                 Yes       Yes                             +----------+------------+---------+-----------+----------+--------------+ Brachial      None       No        No                              +----------+------------+---------+-----------+----------+--------------+ Radial                                              Not visualized +----------+------------+---------+-----------+----------+--------------+ Ulnar                                               Not visualized  +----------+------------+---------+-----------+----------+--------------+ Cephalic  Full                                                 +----------+------------+---------+-----------+----------+--------------+ Basilic       Full                                                 +----------+------------+---------+-----------+----------+--------------+  Summary:  Right: No evidence of thrombosis in the subclavian.  Left: However, unable to visualize the radial, ulnar veins. Findings consistent with acute deep vein thrombosis involving the left brachial veins. There is now DVT in the left brachial vein that was not present on study done 02/26/19.  *See table(s) above for measurements and observations.  Diagnosing physician: Monica Martinez MD Electronically signed by Monica Martinez MD on 03/08/2019 at 5:00:30 PM.    Final      Time spent: 35 minutes  Author: Berle Mull, MD [facility] 03/08/2019 7:57 PM  To reach On-call, see care teams to locate the attending and reach out to them via [email or web address]. If 7PM-7AM, please contact night-coverage If you still have difficulty reaching the attending provider, please page the [facility] (Director on Call) for [facility] on amion for assistance.

## 2019-03-09 LAB — GLUCOSE, CAPILLARY
Glucose-Capillary: 93 mg/dL (ref 70–99)
Glucose-Capillary: 83 mg/dL (ref 70–99)
Glucose-Capillary: 109 mg/dL — ABNORMAL HIGH (ref 70–99)
Glucose-Capillary: 126 mg/dL — ABNORMAL HIGH (ref 70–99)

## 2019-03-09 LAB — BASIC METABOLIC PANEL
Anion gap: 11 (ref 5–15)
Anion gap: 10 (ref 5–15)
BUN: 70 mg/dL — ABNORMAL HIGH (ref 8–23)
BUN: 58 mg/dL — ABNORMAL HIGH (ref 8–23)
CO2: 24 mmol/L (ref 22–32)
CO2: 24 mmol/L (ref 22–32)
Calcium: 8.8 mg/dL — ABNORMAL LOW (ref 8.9–10.3)
Calcium: 8.9 mg/dL (ref 8.9–10.3)
Chloride: 101 mmol/L (ref 98–111)
Chloride: 103 mmol/L (ref 98–111)
Creatinine, Ser: 1.98 mg/dL — ABNORMAL HIGH (ref 0.61–1.24)
Creatinine, Ser: 1.81 mg/dL — ABNORMAL HIGH (ref 0.61–1.24)
GFR calc Af Amer: 39 mL/min — ABNORMAL LOW (ref >60)
GFR calc Af Amer: 43 mL/min — ABNORMAL LOW (ref >60)
GFR calc non Af Amer: 33 mL/min — ABNORMAL LOW (ref >60)
GFR calc non Af Amer: 37 mL/min — ABNORMAL LOW (ref >60)
Glucose, Bld: 118 mg/dL — ABNORMAL HIGH (ref 70–99)
Glucose, Bld: 137 mg/dL — ABNORMAL HIGH (ref 70–99)
Potassium: 3.3 mmol/L — ABNORMAL LOW (ref 3.5–5.1)
Potassium: 3.5 mmol/L (ref 3.5–5.1)
Sodium: 136 mmol/L (ref 135–145)
Sodium: 137 mmol/L (ref 135–145)

## 2019-03-09 LAB — CBC
HCT: 24.5 % — ABNORMAL LOW (ref 39.0–52.0)
Hemoglobin: 8 g/dL — ABNORMAL LOW (ref 13.0–17.0)
MCH: 29.7 pg (ref 26.0–34.0)
MCHC: 32.7 g/dL (ref 30.0–36.0)
MCV: 91.1 fL (ref 80.0–100.0)
Platelets: 203 10*3/uL (ref 150–400)
RBC: 2.69 MIL/uL — ABNORMAL LOW (ref 4.22–5.81)
RDW: 19.7 % — ABNORMAL HIGH (ref 11.5–15.5)
WBC: 4.7 10*3/uL (ref 4.0–10.5)
nRBC: 0.8 % — ABNORMAL HIGH (ref 0.0–0.2)

## 2019-03-09 LAB — AMMONIA: Ammonia: 38 umol/L — ABNORMAL HIGH (ref 9–35)

## 2019-03-09 LAB — VITAMIN B12: Vitamin B-12: 805 pg/mL (ref 180–914)

## 2019-03-09 LAB — TSH: TSH: 3.108 u[IU]/mL (ref 0.350–4.500)

## 2019-03-09 LAB — MAGNESIUM: Magnesium: 2.4 mg/dL (ref 1.7–2.4)

## 2019-03-09 MED ORDER — ONDANSETRON HCL 4 MG/2ML IJ SOLN: 4.0000 mg | Freq: Four times a day (QID) | INTRAMUSCULAR | Status: DC | PRN

## 2019-03-09 MED ORDER — DOCUSATE SODIUM 100 MG PO CAPS
100.0000 mg | ORAL_CAPSULE | Freq: Two times a day (BID) | ORAL | Status: DC
  Administered 2019-03-09 – 2019-03-10 (×2): 100 mg via ORAL
  Filled 2019-03-09 (×2): qty 1

## 2019-03-09 MED ORDER — SODIUM CHLORIDE 0.9 % IV SOLN
INTRAVENOUS | Status: DC | PRN
  Administered 2019-03-09 – 2019-03-11 (×2): 250 mL via INTRAVENOUS

## 2019-03-09 MED ORDER — HYDROCODONE-ACETAMINOPHEN 5-325 MG PO TABS
1.0000 | ORAL_TABLET | Freq: Four times a day (QID) | ORAL | Status: DC | PRN
  Administered 2019-03-09 – 2019-03-12 (×5): 1 via ORAL
  Filled 2019-03-09 (×6): qty 1

## 2019-03-09 MED ORDER — CALAMINE EX LOTN
TOPICAL_LOTION | Freq: Three times a day (TID) | CUTANEOUS | Status: DC
  Administered 2019-03-09: 1 via TOPICAL
  Administered 2019-03-10 – 2019-03-13 (×7): via TOPICAL
  Filled 2019-03-09: qty 118

## 2019-03-09 NOTE — Progress Notes (Signed)
[facility] Progress Note  Patient: Darrell [name] EHM:[id number]   PCP: [name], MD DOB: 30-Apr-1946   DOA: 03/06/2019   DOS: 03/09/2019   Date of Service: the patient was seen and examined on 03/09/2019  Brief hospital course: Pt. 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 [name] from oncology and currently on immunotherapy, recurrent pleural effusion with right-sided Pleurx catheter, CKD stage III, type 2 diabetes, hypertension, hyperlipidemia, thoracic aortic aneurysm, recent left proximal humerus fracture; admitted on 03/06/2019, presented with complaint of shortness of breath, was found to have symptomatic anemia as well as recurrent pleural effusion and acute on chronic CHF Currently further plan is continue IV diuresis and transfusion.  Further work-up with GI.Marland Kitchen  Subjective: Still has some itching.  Breathing better.  No nausea no vomiting.  Family reported some concerns with confusion at night.  Assessment and Plan: Symptomatic anemia. Baseline hemoglobin 8.8. Presented with hemoglobin of 5.0. S/P 3 PRBC Monitor on telemetry. Provide IV Lasix. Continue Protonix every 12 hours. [place] gastroenterology recommends conservative management without any intervention for now.  Acute on chronic diastolic CHF. BNP 334, improved since labs done 12 days ago.  Echo done February 23, 2019 showing normal systolic function with EF 60 to 36% and diastolic function could not be assessed. EKG without acute ischemic changes. -Management of anemia as mentioned below -IV Lasix 20mg   twice daily. -Low suspicion for pneumonia.  -Continuous pulse ox, supplemental oxygen if needed  Recurrent right pleural effusion. S/P pleural catheter placement IR consulted since there is more fluid collection. IR feels that the fluid collection is stable.  Repeat chest x-ray shows actually no fluid.  No further work-up. Appreciate their assistance.  Left upper  extremity DVT. Only limited to the brachial vein.  Subclavian and IJ are compressible without any clot. At present we will manage this conservatively given patient's presentation with acute upper GI bleed life-threatening low hemoglobin. Repeat upper extremity Doppler in 48 hours. If there is evidence of progression he may require IVC filter placement.  Abnormal urinalysis UA with moderate amount of leukocytes, 0-5 WBCs, rare bacteria, and negative nitrite.  Afebrile and no leukocytosis.  Patient is not endorsing any UTI symptoms. -Urine culture pending  CKD stage III -Creatinine 2.2, stable since labs done 6 days ago. 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 [name] from oncology and currently on immunotherapy. Has recurrent pleural effusion with right-sided Pleurx catheter. -Oncology follow-up  Well-controlled insulin-dependent diabetes mellitus -A1c 5.8 on 8/14.  Sliding scale insulin sensitive every 4 hours as patient is currently n.p.o. CBG checks.  Hypertension -Hold off home antihypertensives at this time given soft blood pressure and concern for acute upper GI bleed.  Body mass index is 39.79 kg/m.   Left humerus fracture. Pain control with Norco. Any narcotic causes itching for this patient. Patient was provided Atarax but with that causes confusion. Currently using calamine lotion. If the patient has persistent itching despite Atarax will use nightly as needed Atarax.  Diet: Cardiac diet DVT Prophylaxis: SCD, pharmacological prophylaxis contraindicated due to Active GI bleed  Advance goals of care discussion: Full code  Family Communication: family was present at bedside, at the time of interview. The pt provided permission to discuss medical plan with the family. Opportunity was given to ask question and all questions were answered satisfactorily.   Disposition:  Discharge to Home  .  Consultants: gastroenterology [facility] Procedures: none  Scheduled Meds: . calamine   Topical TID  . docusate sodium  100 mg Oral BID  . furosemide  20 mg Intravenous BID  . insulin aspart  0-15 Units Subcutaneous TID WC  . insulin aspart  0-5 Units Subcutaneous QHS  . ipratropium-albuterol  3 mL Nebulization BID  . pantoprazole  40 mg Oral BID AC  . potassium chloride  40 mEq Oral Daily  . sodium chloride flush  10-40 mL Intracatheter Q12H   Continuous Infusions: . sodium chloride 250 mL (03/09/19 0926)   PRN Meds: sodium chloride, albuterol, HYDROcodone-acetaminophen, ondansetron (ZOFRAN) IV, sodium chloride flush Antibiotics: Anti-infectives (From admission, onward)   Start     Dose/Rate Route Frequency Ordered Stop   03/07/19 0030  cefTRIAXone (ROCEPHIN) 1 g in sodium chloride 0.9 % 100 mL IVPB  Status:  Discontinued     1 g 200 mL/hr over 30 Minutes Intravenous Daily at bedtime 03/07/19 0016 03/07/19 0046   03/07/19 0030  azithromycin (ZITHROMAX) 500 mg in sodium chloride 0.9 % 250 mL IVPB  Status:  Discontinued     500 mg 250 mL/hr over 60 Minutes Intravenous Daily at bedtime 03/07/19 0016 03/07/19 0046       Objective: Physical Exam: Vitals:   03/09/19 0325 03/09/19 0804 03/09/19 1006 03/09/19 1333  BP: (!) 142/79   130/71  Pulse: 83   82  Resp: 16   (!) 23  Temp: 98 F (36.7 C)   97.8 F (36.6 C)  TempSrc: Oral   Oral  SpO2: 98% 99%  99%  Weight:   118.7 kg   Height:        Intake/Output Summary (Last 24 hours) at 03/09/2019 1800 Last data filed at 03/09/2019 1600 Gross per 24 hour  Intake 1025 ml  Output 275 ml  Net 750 ml   Filed Weights   03/07/19 1223 03/08/19 0557 03/09/19 1006  Weight: 120.2 kg 118.4 kg 118.7 kg   General: alert and oriented to time and place. Appear in moderate distress, affect appropriate Eyes: PERRL, Conjunctiva normal ENT: Oral Mucosa Clear, moist  Neck: no JVD, no Abnormal Mass Or lumps Cardiovascular: S1 and S2 Present,  no Murmur, peripheral pulses symmetrical Respiratory: increased respiratory effort, Bilateral Air entry equal and Decreased, no use of accessory muscle, bilateral  Crackles, no wheezes Abdomen: Bowel Sound present, Soft and no tenderness, no hernia Skin: no rashes  Extremities: bilateral  Pedal edema, no calf tenderness, left upper extremity swelling as well. Neurologic: normal without focal findings, mental status, speech normal, alert and oriented x3, PERLA, Motor strength 5/5 and symmetric and sensation grossly normal to light touch Gait not checked due to patient safety concerns  Data Reviewed: CBC: Recent Labs  Lab 03/06/19 2021 03/07/19 0654 03/07/19 0936 03/07/19 1852 03/08/19 0458 03/09/19 0346  WBC 5.9  --  4.7 4.2 4.2 4.7  NEUTROABS 4.9  --   --   --   --   --   HGB 5.0* 7.0* 6.9* 7.7* 8.0* 8.0*  HCT 15.7* 21.0* 20.8* 24.0* 24.4* 24.5*  MCV 92.9  --  90.0 89.9 90.4 91.1  PLT 191  --  192 202 215 662   Basic Metabolic Panel: Recent Labs  Lab 03/06/19 2021 03/07/19 0654 03/08/19 0458 03/09/19 0933  NA 136 137 142 136  K 3.6 3.5 3.2* 3.3*  CL 102 104 105 101  CO2 23 26 25 24   GLUCOSE 154* 123* 115* 118*  BUN 98* 93*  81* 70*  CREATININE 2.22* 2.19* 1.97* 1.98*  CALCIUM 8.8* 8.8* 9.0 8.8*  MG  --   --  2.5* 2.4    Liver Function Tests: Recent Labs  Lab 03/06/19 2021 03/08/19 0458  AST 46* 37  ALT 26 26  ALKPHOS 33* 34*  BILITOT 1.2 2.0*  PROT 6.1* 6.7  ALBUMIN 3.1* 3.2*   No results for input(s): LIPASE, AMYLASE in the last 168 hours. No results for input(s): AMMONIA in the last 168 hours. Coagulation Profile: Recent Labs  Lab 03/06/19 2258  INR 1.3*   Cardiac Enzymes: No results for input(s): CKTOTAL, CKMB, CKMBINDEX, TROPONINI in the last 168 hours. BNP (last 3 results) No results for input(s): PROBNP in the last 8760 hours. CBG: Recent Labs  Lab 03/08/19 1631 03/08/19 2037 03/09/19 0739 03/09/19 1205 03/09/19 1624  GLUCAP 91 107* 93  83 109*   Studies: No results found.   Time spent: 35 minutes  Author: Berle Mull, MD [facility] 03/09/2019 6:00 PM  To reach On-call, see care teams to locate the attending and reach out to them via [email or web address]. If 7PM-7AM, please contact night-coverage If you still have difficulty reaching the attending provider, please page the [facility] (Director on Call) for [facility] on amion for assistance.

## 2019-03-09 NOTE — Care Management Important Message (Signed)
Important Message  Patient Details IM Letter given to Dessa Phi RN to present to the Patient Name: Darrell [name] MRN: [id number] Date of Birth: 1945/11/22   Medicare Important Message Given:  Yes     Kerin Salen 03/09/2019, 11:58 AM

## 2019-03-10 ENCOUNTER — Inpatient Hospital Stay (HOSPITAL_COMMUNITY): Payer: Medicare Other

## 2019-03-10 ENCOUNTER — Encounter (HOSPITAL_COMMUNITY): Payer: Medicare Other

## 2019-03-10 LAB — BASIC METABOLIC PANEL
Anion gap: 11 (ref 5–15)
BUN: 49 mg/dL — ABNORMAL HIGH (ref 8–23)
CO2: 24 mmol/L (ref 22–32)
Calcium: 8.8 mg/dL — ABNORMAL LOW (ref 8.9–10.3)
Chloride: 104 mmol/L (ref 98–111)
Creatinine, Ser: 1.73 mg/dL — ABNORMAL HIGH (ref 0.61–1.24)
GFR calc Af Amer: 45 mL/min — ABNORMAL LOW (ref >60)
GFR calc non Af Amer: 39 mL/min — ABNORMAL LOW (ref >60)
Glucose, Bld: 110 mg/dL — ABNORMAL HIGH (ref 70–99)
Potassium: 3.7 mmol/L (ref 3.5–5.1)
Sodium: 139 mmol/L (ref 135–145)

## 2019-03-10 LAB — GLUCOSE, CAPILLARY
Glucose-Capillary: 91 mg/dL (ref 70–99)
Glucose-Capillary: 110 mg/dL — ABNORMAL HIGH (ref 70–99)
Glucose-Capillary: 99 mg/dL (ref 70–99)
Glucose-Capillary: 125 mg/dL — ABNORMAL HIGH (ref 70–99)

## 2019-03-10 LAB — CBC
HCT: 24.4 % — ABNORMAL LOW (ref 39.0–52.0)
Hemoglobin: 7.8 g/dL — ABNORMAL LOW (ref 13.0–17.0)
MCH: 29.3 pg (ref 26.0–34.0)
MCHC: 32 g/dL (ref 30.0–36.0)
MCV: 91.7 fL (ref 80.0–100.0)
Platelets: 204 10*3/uL (ref 150–400)
RBC: 2.66 MIL/uL — ABNORMAL LOW (ref 4.22–5.81)
RDW: 19.9 % — ABNORMAL HIGH (ref 11.5–15.5)
WBC: 5.4 10*3/uL (ref 4.0–10.5)
nRBC: 0 % (ref 0.0–0.2)

## 2019-03-10 LAB — BRAIN NATRIURETIC PEPTIDE: B Natriuretic Peptide: 363.7 pg/mL — ABNORMAL HIGH (ref 0.0–100.0)

## 2019-03-10 LAB — BLOOD GAS, ARTERIAL
Acid-Base Excess: 0.7 mmol/L (ref 0.0–2.0)
Bicarbonate: 23.9 mmol/L (ref 20.0–28.0)
Drawn by: 270211
FIO2: 0.21
O2 Saturation: 96.4 %
Patient temperature: 98.6
pCO2 arterial: 34.7 mmHg (ref 32.0–48.0)
pH, Arterial: 7.452 — ABNORMAL HIGH (ref 7.350–7.450)
pO2, Arterial: 83.8 mmHg (ref 83.0–108.0)

## 2019-03-10 LAB — CK: Total CK: 108 U/L (ref 49–397)

## 2019-03-10 LAB — MAGNESIUM: Magnesium: 2.3 mg/dL (ref 1.7–2.4)

## 2019-03-10 MED ORDER — FUROSEMIDE 40 MG PO TABS
40.0000 mg | ORAL_TABLET | Freq: Two times a day (BID) | ORAL | Status: DC
  Administered 2019-03-10: 08:00:00 40 mg via ORAL
  Filled 2019-03-10: qty 1

## 2019-03-10 MED ORDER — SENNOSIDES-DOCUSATE SODIUM 8.6-50 MG PO TABS
1.0000 | ORAL_TABLET | Freq: Two times a day (BID) | ORAL | Status: DC
  Administered 2019-03-10 – 2019-03-13 (×7): 1 via ORAL
  Filled 2019-03-10 (×7): qty 1

## 2019-03-10 MED ORDER — FUROSEMIDE 10 MG/ML IJ SOLN
40.0000 mg | Freq: Two times a day (BID) | INTRAMUSCULAR | Status: DC
  Administered 2019-03-10 – 2019-03-11 (×3): 40 mg via INTRAVENOUS
  Filled 2019-03-10 (×3): qty 4

## 2019-03-10 NOTE — Plan of Care (Signed)
  Problem: Nutrition: Goal: Adequate nutrition will be maintained Outcome: Progressing   Problem: Pain Managment: Goal: General experience of comfort will improve 03/10/2019 2305 by Tonny Bollman, RN Outcome: Progressing 03/10/2019 2249 by Tonny Bollman, RN Outcome: Progressing   Problem: Safety: Goal: Ability to remain free from injury will improve 03/10/2019 2305 by Tonny Bollman, RN Outcome: Progressing 03/10/2019 2249 by Tonny Bollman, RN Outcome: Progressing

## 2019-03-10 NOTE — Progress Notes (Signed)
[facility] Progress Note  Patient: Darrell [name] KKX:[id number]   PCP: [name], MD DOB: 1946/01/20   DOA: 03/06/2019   DOS: 03/10/2019   Date of Service: the patient was seen and examined on 03/10/2019  Brief hospital course: Pt. 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 [name] from oncology and currently on immunotherapy, recurrent pleural effusion with right-sided Pleurx catheter, CKD stage III, type 2 diabetes, hypertension, hyperlipidemia, thoracic aortic aneurysm, recent left proximal humerus fracture; admitted on 03/06/2019, presented with complaint of shortness of breath, was found to have symptomatic anemia as well as recurrent pleural effusion and acute on chronic CHF Currently further plan is continue IV diuresis and transfusion.  Further work-up with GI.Marland Kitchen  Subjective: No itching.  Breathing actually reportedly better.  But patient is somewhat lethargic and falls to sleep mid conversation.  No nausea no vomiting.  Oral intake adequate.  Pain well controlled.  No BM so far.  Assessment and Plan: Symptomatic anemia.  Secondary to acute upper GI bleeding. Baseline hemoglobin 8.8.  Currently hemoglobin stable. Presented with hemoglobin of 5.0. S/P 3 PRBC Monitor on telemetry. Continue Protonix every 12 hours. [facility] recommends conservative management without any intervention for now.  Acute on chronic diastolic CHF. BNP 334, improved since labs done 12 days ago.  Echo done February 23, 2019 showing normal systolic function with EF 60 to 16% and diastolic function could not be assessed. EKG without acute ischemic changes. -Management of anemia as mentioned below -IV Lasix 40 mg  twice daily. -Low suspicion for pneumonia.  -Continuous pulse ox, supplemental oxygen if needed  Recurrent right pleural effusion. S/P pleural catheter placement IR consulted since there is more fluid collection. IR feels that the fluid  collection is stable.  Repeat chest x-ray shows actually no fluid.  No further work-up. Appreciate their assistance.  Left upper extremity DVT. Only limited to the brachial vein.  Subclavian and IJ are compressible without any clot. At present we will manage this conservatively given patient's presentation with acute upper GI bleed life-threatening low hemoglobin. Repeat upper extremity Doppler ordered If there is evidence of progression he may require IVC filter placement.  Acute metabolic encephalopathy. Patient is somewhat confused and lethargic. Likely secondary to medication but also metabolic component cannot be ruled out. ABG unremarkable. Atarax discontinued. We will check pulse oximetry overnight to assess for sleep apnea.  Abnormal urinalysis UA with moderate amount of leukocytes, 0-5 WBCs, rare bacteria, and negative nitrite.  Afebrile and no leukocytosis.  Patient is not endorsing any UTI symptoms. -Urine culture no significant growth  CKD stage III -Creatinine 2.2, stable since labs done 6 days ago. 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 [name] from oncology and currently on immunotherapy. Has recurrent pleural effusion with right-sided Pleurx catheter. -Oncology follow-up  Well-controlled insulin-dependent diabetes mellitus -A1c 5.8 on 8/14.  Sliding scale   Hypertension -Hold off home antihypertensives at this time given soft blood pressure and concern for acute upper GI bleed.  Morbid obesity Concern for OSA/obesity hypoventilation syndrome Body mass index is 39.75 kg/m.  May benefit from outpatient sleep study.  Left humerus fracture. Pain control with Norco. Any narcotic causes itching for this patient. Patient was provided Atarax but with that causes confusion. Currently using calamine lotion. If the patient has persistent itching despite Atarax will use nightly as needed  Atarax.  Diet: Cardiac diet  DVT Prophylaxis: SCD, pharmacological prophylaxis contraindicated due to Active GI bleed  Advance goals of care discussion: Full code  Family Communication: family was present at bedside, at the time of interview. The pt provided permission to discuss medical plan with the family. Opportunity was given to ask question and all questions were answered satisfactorily.   Disposition:  Discharge to Home .  Consultants: gastroenterology [facility] Procedures: none  Scheduled Meds: . calamine   Topical TID  . furosemide  40 mg Intravenous BID  . insulin aspart  0-15 Units Subcutaneous TID WC  . insulin aspart  0-5 Units Subcutaneous QHS  . pantoprazole  40 mg Oral BID AC  . potassium chloride  40 mEq Oral Daily  . senna-docusate  1 tablet Oral BID  . sodium chloride flush  10-40 mL Intracatheter Q12H   Continuous Infusions: . sodium chloride 250 mL (03/09/19 0926)   PRN Meds: sodium chloride, albuterol, HYDROcodone-acetaminophen, ondansetron (ZOFRAN) IV, sodium chloride flush Antibiotics: Anti-infectives (From admission, onward)   Start     Dose/Rate Route Frequency Ordered Stop   03/07/19 0030  cefTRIAXone (ROCEPHIN) 1 g in sodium chloride 0.9 % 100 mL IVPB  Status:  Discontinued     1 g 200 mL/hr over 30 Minutes Intravenous Daily at bedtime 03/07/19 0016 03/07/19 0046   03/07/19 0030  azithromycin (ZITHROMAX) 500 mg in sodium chloride 0.9 % 250 mL IVPB  Status:  Discontinued     500 mg 250 mL/hr over 60 Minutes Intravenous Daily at bedtime 03/07/19 0016 03/07/19 0046       Objective: Physical Exam: Vitals:   03/09/19 2112 03/10/19 0411 03/10/19 1350 03/10/19 1413  BP: 133/80 (!) 141/82 135/79 137/73  Pulse: 82 82 87 79  Resp: (!) 24 (!) 22 18 19   Temp: 97.6 F (36.4 C) 98.3 F (36.8 C) 98.7 F (37.1 C) 98.9 F (37.2 C)  TempSrc: Oral Oral Oral Oral  SpO2: 98% 97% 99% 98%  Weight:  118.6 kg    Height:        Intake/Output Summary (Last 24  hours) at 03/10/2019 1644 Last data filed at 03/10/2019 1521 Gross per 24 hour  Intake 471.74 ml  Output 675 ml  Net -203.26 ml   Filed Weights   03/08/19 0557 03/09/19 1006 03/10/19 0411  Weight: 118.4 kg 118.7 kg 118.6 kg   General: alert and oriented to time and place. Appear in moderate distress, affect appropriate Eyes: PERRL, Conjunctiva normal ENT: Oral Mucosa Clear, moist  Neck: no JVD, no Abnormal Mass Or lumps Cardiovascular: S1 and S2 Present, no Murmur, peripheral pulses symmetrical Respiratory: increased respiratory effort, Bilateral Air entry equal and Decreased, no use of accessory muscle, bilateral  Crackles, no wheezes Abdomen: Bowel Sound present, Soft and no tenderness, no hernia Skin: no rashes  Extremities: bilateral  Pedal edema, no calf tenderness, left upper extremity swelling as well. Neurologic: normal without focal findings, mental status, speech normal, alert and oriented x3, PERLA, Motor strength 5/5 and symmetric and sensation grossly normal to light touch Gait not checked due to patient safety concerns  Data Reviewed: CBC: Recent Labs  Lab 03/06/19 2021  03/07/19 0936 03/07/19 1852 03/08/19 0458 03/09/19 0346 03/10/19 0436  WBC 5.9  --  4.7 4.2 4.2 4.7 5.4  NEUTROABS 4.9  --   --   --   --   --   --   HGB 5.0*   < > 6.9* 7.7* 8.0* 8.0* 7.8*  HCT 15.7*   < > 20.8*  24.0* 24.4* 24.5* 24.4*  MCV 92.9  --  90.0 89.9 90.4 91.1 91.7  PLT 191  --  192 202 215 203 204   < > = values in this interval not displayed.   Basic Metabolic Panel: Recent Labs  Lab 03/07/19 0654 03/08/19 0458 03/09/19 0933 03/09/19 1847 03/10/19 1150  NA 137 142 136 137 139  K 3.5 3.2* 3.3* 3.5 3.7  CL 104 105 101 103 104  CO2 26 25 24 24 24   GLUCOSE 123* 115* 118* 137* 110*  BUN 93* 81* 70* 58* 49*  CREATININE 2.19* 1.97* 1.98* 1.81* 1.73*  CALCIUM 8.8* 9.0 8.8* 8.9 8.8*  MG  --  2.5* 2.4  --  2.3    Liver Function Tests: Recent Labs  Lab 03/06/19 2021  03/08/19 0458  AST 46* 37  ALT 26 26  ALKPHOS 33* 34*  BILITOT 1.2 2.0*  PROT 6.1* 6.7  ALBUMIN 3.1* 3.2*   No results for input(s): LIPASE, AMYLASE in the last 168 hours. Recent Labs  Lab 03/09/19 2000  AMMONIA 38*   Coagulation Profile: Recent Labs  Lab 03/06/19 2258  INR 1.3*   Cardiac Enzymes: Recent Labs  Lab 03/10/19 1150  CKTOTAL 108   BNP (last 3 results) No results for input(s): PROBNP in the last 8760 hours. CBG: Recent Labs  Lab 03/09/19 1624 03/09/19 2115 03/10/19 0805 03/10/19 1137 03/10/19 1638  GLUCAP 109* 126* 91 110* 99   Studies: Dg Chest Port 1 View  Result Date: 03/10/2019 CLINICAL DATA:  Shortness of breath. EXAM: PORTABLE CHEST 1 VIEW COMPARISON:  Chest x-ray dated March 08, 2019. FINDINGS: Unchanged left chest wall port catheter. Stable cardiomediastinal silhouette. Right perihilar airspace disease has improved. Unchanged small right pleural effusion. The left lung is clear. No pneumothorax. No acute osseous abnormality. IMPRESSION: 1. Slightly improved right perihilar airspace disease. Unchanged small right pleural effusion. Electronically Signed   By: Titus Dubin M.D.   On: 03/10/2019 14:03     Time spent: 35 minutes  Author: Berle Mull, MD [facility] 03/10/2019 4:44 PM  To reach On-call, see care teams to locate the attending and reach out to them via [email or web address]. If 7PM-7AM, please contact night-coverage If you still have difficulty reaching the attending provider, please page the [facility] (Director on Call) for [facility] on amion for assistance.

## 2019-03-10 NOTE — Plan of Care (Signed)
  Problem: Nutrition: Goal: Adequate nutrition will be maintained Outcome: Progressing   Problem: Pain Managment: Goal: General experience of comfort will improve Outcome: Progressing   Problem: Safety: Goal: Ability to remain free from injury will improve Outcome: Progressing   

## 2019-03-11 LAB — CBC
HCT: 23.5 % — ABNORMAL LOW (ref 39.0–52.0)
Hemoglobin: 7.5 g/dL — ABNORMAL LOW (ref 13.0–17.0)
MCH: 29.8 pg (ref 26.0–34.0)
MCHC: 31.9 g/dL (ref 30.0–36.0)
MCV: 93.3 fL (ref 80.0–100.0)
Platelets: 190 10*3/uL (ref 150–400)
RBC: 2.52 MIL/uL — ABNORMAL LOW (ref 4.22–5.81)
RDW: 19.5 % — ABNORMAL HIGH (ref 11.5–15.5)
WBC: 5.3 10*3/uL (ref 4.0–10.5)
nRBC: 0 % (ref 0.0–0.2)

## 2019-03-11 LAB — GLUCOSE, CAPILLARY
Glucose-Capillary: 83 mg/dL (ref 70–99)
Glucose-Capillary: 104 mg/dL — ABNORMAL HIGH (ref 70–99)
Glucose-Capillary: 131 mg/dL — ABNORMAL HIGH (ref 70–99)
Glucose-Capillary: 127 mg/dL — ABNORMAL HIGH (ref 70–99)

## 2019-03-11 NOTE — Progress Notes (Signed)
Pt had a large black and brown bowel movement this shift at 0900. No bright red blood noted.

## 2019-03-11 NOTE — Progress Notes (Signed)
Overnight pulse ox study complete.

## 2019-03-11 NOTE — Progress Notes (Signed)
[facility] Progress Note  Patient: Darrell [name] ZOX:[id number]   PCP: [name], MD DOB: 07-Oct-1945   DOA: 03/06/2019   DOS: 03/11/2019   Date of Service: the patient was seen and examined on 03/11/2019  Brief hospital course: Pt. 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 [name] from oncology and currently on immunotherapy, recurrent pleural effusion with right-sided Pleurx catheter, CKD stage III, type 2 diabetes, hypertension, hyperlipidemia, thoracic aortic aneurysm, recent left proximal humerus fracture; admitted on 03/06/2019, presented with complaint of shortness of breath, was found to have symptomatic anemia as well as recurrent pleural effusion and acute on chronic CHF Currently further plan is continue IV diuresis and transfusion.  Further work-up with GI.Marland Kitchen  Subjective: More awake.  Reports that he is unable to sleep.  Actually had a bowel movement without any blood.  No nausea no vomiting.  Assessment and Plan: Symptomatic anemia.  Secondary to acute upper GI bleeding. Baseline hemoglobin 8.8.  Currently hemoglobin stable. Presented with hemoglobin of 5.0. S/P 3 PRBC Monitor on telemetry. Continue Protonix every 12 hours. [facility] recommends conservative management without any intervention for now. Depending on the results of the repeat Dopplers we will consider reconsulting GI for EGD versus continue conservative management.  Acute on chronic diastolic CHF. BNP 334, improved since labs done 12 days ago.  Echo done February 23, 2019 showing normal systolic function with EF 60 to 81% and diastolic function could not be assessed. EKG without acute ischemic changes. -Management of anemia as mentioned below -IV Lasix 40 mg  twice daily. -Low suspicion for pneumonia.  -Continuous pulse ox, supplemental oxygen if needed  Recurrent right pleural effusion. S/P pleural catheter placement IR consulted since there is  more fluid collection. IR feels that the fluid collection is stable.  Repeat chest x-ray shows actually no fluid.  No further work-up. Appreciate their assistance.  Left upper extremity DVT. Only limited to the brachial vein.  Subclavian and IJ are compressible without any clot. At present we will manage this conservatively given patient's presentation with acute upper GI bleed life-threatening low hemoglobin. Repeat upper extremity Doppler ordered If there is evidence of progression he may require IVC filter placement versus EGD.  Acute metabolic encephalopathy. Patient is somewhat confused and lethargic. Likely secondary to medication but also metabolic component cannot be ruled out. ABG unremarkable. Atarax discontinued. We will check pulse oximetry overnight to assess for sleep apnea.  Abnormal urinalysis UA with moderate amount of leukocytes, 0-5 WBCs, rare bacteria, and negative nitrite.  Afebrile and no leukocytosis.  Patient is not endorsing any UTI symptoms. -Urine culture no significant growth  CKD stage III -Creatinine 2.2, stable since labs done 6 days ago. 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 [name] from oncology and currently on immunotherapy. Has recurrent pleural effusion with right-sided Pleurx catheter. -Oncology follow-up  Well-controlled insulin-dependent diabetes mellitus -A1c 5.8 on 8/14.  Sliding scale   Hypertension -Hold off home antihypertensives at this time given soft blood pressure and concern for acute upper GI bleed.  Morbid obesity Concern for OSA/obesity hypoventilation syndrome Body mass index is 39.41 kg/m.  May benefit from outpatient sleep study.  Left humerus fracture. Pain control with Norco. Any narcotic causes itching for this patient. Patient was provided Atarax but with that causes confusion. Currently using calamine lotion. If the patient has persistent  itching despite Atarax will use  nightly as needed Atarax.  Diet: Cardiac diet DVT Prophylaxis: SCD, pharmacological prophylaxis contraindicated due to Active GI bleed  Advance goals of care discussion: Full code  Family Communication: family was present at bedside, at the time of interview. The pt provided permission to discuss medical plan with the family. Opportunity was given to ask question and all questions were answered satisfactorily.   Disposition:  Discharge to Home .  Consultants: gastroenterology [facility] Procedures: none  Scheduled Meds: . calamine   Topical TID  . furosemide  40 mg Intravenous BID  . insulin aspart  0-15 Units Subcutaneous TID WC  . insulin aspart  0-5 Units Subcutaneous QHS  . pantoprazole  40 mg Oral BID AC  . potassium chloride  40 mEq Oral Daily  . senna-docusate  1 tablet Oral BID  . sodium chloride flush  10-40 mL Intracatheter Q12H   Continuous Infusions: . sodium chloride 250 mL (03/11/19 0812)   PRN Meds: sodium chloride, albuterol, HYDROcodone-acetaminophen, ondansetron (ZOFRAN) IV, sodium chloride flush Antibiotics: Anti-infectives (From admission, onward)   Start     Dose/Rate Route Frequency Ordered Stop   03/07/19 0030  cefTRIAXone (ROCEPHIN) 1 g in sodium chloride 0.9 % 100 mL IVPB  Status:  Discontinued     1 g 200 mL/hr over 30 Minutes Intravenous Daily at bedtime 03/07/19 0016 03/07/19 0046   03/07/19 0030  azithromycin (ZITHROMAX) 500 mg in sodium chloride 0.9 % 250 mL IVPB  Status:  Discontinued     500 mg 250 mL/hr over 60 Minutes Intravenous Daily at bedtime 03/07/19 0016 03/07/19 0046       Objective: Physical Exam: Vitals:   03/10/19 1413 03/10/19 2055 03/11/19 0543 03/11/19 1349  BP: 137/73 140/73 (!) 144/76 130/73  Pulse: 79 76 75 84  Resp: 19 18 16 20   Temp: 98.9 F (37.2 C) 98.7 F (37.1 C) 98.6 F (37 C) 97.7 F (36.5 C)  TempSrc: Oral Oral Oral Oral  SpO2: 98% 98% 98% 100%  Weight:   117.6 kg   Height:         Intake/Output Summary (Last 24 hours) at 03/11/2019 1521 Last data filed at 03/11/2019 1343 Gross per 24 hour  Intake 1306.49 ml  Output 1825 ml  Net -518.51 ml   Filed Weights   03/09/19 1006 03/10/19 0411 03/11/19 0543  Weight: 118.7 kg 118.6 kg 117.6 kg   General: alert and oriented to time and place. Appear in moderate distress, affect appropriate Eyes: PERRL, Conjunctiva normal ENT: Oral Mucosa Clear, moist  Neck: no JVD, no Abnormal Mass Or lumps Cardiovascular: S1 and S2 Present, no Murmur, peripheral pulses symmetrical Respiratory: increased respiratory effort, Bilateral Air entry equal and Decreased, no use of accessory muscle, bilateral  Crackles, no wheezes Abdomen: Bowel Sound present, Soft and no tenderness, no hernia Skin: no rashes  Extremities: bilateral  Pedal edema, no calf tenderness, left upper extremity swelling as well. Neurologic: normal without focal findings, mental status, speech normal, alert and oriented x3, PERLA, Motor strength 5/5 and symmetric and sensation grossly normal to light touch Gait not checked due to patient safety concerns  Data Reviewed: CBC: Recent Labs  Lab 03/06/19 2021  03/07/19 1852 03/08/19 0458 03/09/19 0346 03/10/19 0436 03/11/19 0343  WBC 5.9   < > 4.2 4.2 4.7 5.4 5.3  NEUTROABS 4.9  --   --   --   --   --   --   HGB 5.0*   < > 7.7* 8.0* 8.0* 7.8* 7.5*  HCT 15.7*   < > 24.0* 24.4* 24.5* 24.4* 23.5*  MCV 92.9   < > 89.9 90.4 91.1 91.7 93.3  PLT 191   < > 202 215 203 204 190   < > = values in this interval not displayed.   Basic Metabolic Panel: Recent Labs  Lab 03/07/19 0654 03/08/19 0458 03/09/19 0933 03/09/19 1847 03/10/19 1150  NA 137 142 136 137 139  K 3.5 3.2* 3.3* 3.5 3.7  CL 104 105 101 103 104  CO2 26 25 24 24 24   GLUCOSE 123* 115* 118* 137* 110*  BUN 93* 81* 70* 58* 49*  CREATININE 2.19* 1.97* 1.98* 1.81* 1.73*  CALCIUM 8.8* 9.0 8.8* 8.9 8.8*  MG  --  2.5* 2.4  --  2.3    Liver Function  Tests: Recent Labs  Lab 03/06/19 2021 03/08/19 0458  AST 46* 37  ALT 26 26  ALKPHOS 33* 34*  BILITOT 1.2 2.0*  PROT 6.1* 6.7  ALBUMIN 3.1* 3.2*   No results for input(s): LIPASE, AMYLASE in the last 168 hours. Recent Labs  Lab 03/09/19 2000  AMMONIA 38*   Coagulation Profile: Recent Labs  Lab 03/06/19 2258  INR 1.3*   Cardiac Enzymes: Recent Labs  Lab 03/10/19 1150  CKTOTAL 108   BNP (last 3 results) No results for input(s): PROBNP in the last 8760 hours. CBG: Recent Labs  Lab 03/10/19 1137 03/10/19 1638 03/10/19 2018 03/11/19 0809 03/11/19 1215  GLUCAP 110* 99 125* 83 104*   Studies: No results found.   Time spent: 35 minutes  Author: Berle Mull, MD [facility] 03/11/2019 3:21 PM  To reach On-call, see care teams to locate the attending and reach out to them via [email or web address]. If 7PM-7AM, please contact night-coverage If you still have difficulty reaching the attending provider, please page the [facility] (Director on Call) for [facility] on amion for assistance.

## 2019-03-12 ENCOUNTER — Inpatient Hospital Stay (HOSPITAL_COMMUNITY): Payer: Medicare Other

## 2019-03-12 DIAGNOSIS — I82402 Acute embolism and thrombosis of unspecified deep veins of left lower extremity: Secondary | ICD-10-CM

## 2019-03-12 LAB — GLUCOSE, CAPILLARY
Glucose-Capillary: 103 mg/dL — ABNORMAL HIGH (ref 70–99)
Glucose-Capillary: 116 mg/dL — ABNORMAL HIGH (ref 70–99)
Glucose-Capillary: 116 mg/dL — ABNORMAL HIGH (ref 70–99)
Glucose-Capillary: 143 mg/dL — ABNORMAL HIGH (ref 70–99)

## 2019-03-12 LAB — BASIC METABOLIC PANEL
Anion gap: 10 (ref 5–15)
BUN: 34 mg/dL — ABNORMAL HIGH (ref 8–23)
CO2: 24 mmol/L (ref 22–32)
Calcium: 8.5 mg/dL — ABNORMAL LOW (ref 8.9–10.3)
Chloride: 102 mmol/L (ref 98–111)
Creatinine, Ser: 1.67 mg/dL — ABNORMAL HIGH (ref 0.61–1.24)
GFR calc Af Amer: 47 mL/min — ABNORMAL LOW (ref >60)
GFR calc non Af Amer: 41 mL/min — ABNORMAL LOW (ref >60)
Glucose, Bld: 147 mg/dL — ABNORMAL HIGH (ref 70–99)
Potassium: 3.4 mmol/L — ABNORMAL LOW (ref 3.5–5.1)
Sodium: 136 mmol/L (ref 135–145)

## 2019-03-12 LAB — CBC
HCT: 25.8 % — ABNORMAL LOW (ref 39.0–52.0)
Hemoglobin: 8.1 g/dL — ABNORMAL LOW (ref 13.0–17.0)
MCH: 29.2 pg (ref 26.0–34.0)
MCHC: 31.4 g/dL (ref 30.0–36.0)
MCV: 93.1 fL (ref 80.0–100.0)
Platelets: 234 10*3/uL (ref 150–400)
RBC: 2.77 MIL/uL — ABNORMAL LOW (ref 4.22–5.81)
RDW: 19.2 % — ABNORMAL HIGH (ref 11.5–15.5)
WBC: 7.1 10*3/uL (ref 4.0–10.5)
nRBC: 0 % (ref 0.0–0.2)

## 2019-03-12 LAB — MAGNESIUM: Magnesium: 2.1 mg/dL (ref 1.7–2.4)

## 2019-03-12 MED ORDER — FUROSEMIDE 40 MG PO TABS
40.0000 mg | ORAL_TABLET | Freq: Two times a day (BID) | ORAL | Status: DC
  Administered 2019-03-13: 40 mg via ORAL
  Filled 2019-03-12: qty 1

## 2019-03-12 NOTE — Progress Notes (Signed)
[facility] Progress Note  Patient: Darrell [name] NTI:[id number]   PCP: [name], MD DOB: May 13, 1946   DOA: 03/06/2019   DOS: 03/12/2019   Date of Service: the patient was seen and examined on 03/12/2019  Brief hospital course: Pt. 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 [name] from oncology and currently on immunotherapy, recurrent pleural effusion with right-sided Pleurx catheter, CKD stage III, type 2 diabetes, hypertension, hyperlipidemia, thoracic aortic aneurysm, recent left proximal humerus fracture; admitted on 03/06/2019, presented with complaint of shortness of breath, was found to have symptomatic anemia as well as recurrent pleural effusion and acute on chronic CHF Currently further plan is continue IV diuresis and transfusion.  Further work-up with GI.Marland Kitchen  Subjective: No acute complaints.  Breathing better.  No nausea no vomiting.  No fever no chills.  Swelling in the hand actually is also better.  Assessment and Plan: Symptomatic anemia.  Secondary to acute upper GI bleeding. Baseline hemoglobin 8.8.  Currently hemoglobin stable. Presented with hemoglobin of 5.0. S/P 3 PRBC Monitor on telemetry. Continue Protonix every 12 hours. [facility] [facility] recommends conservative management without any intervention for now. Depending on the results of the repeat Dopplers we will consider reconsulting GI for EGD versus continue conservative management.  Acute on chronic diastolic CHF. BNP 334, improved since labs done 12 days ago.  Echo done February 23, 2019 showing normal systolic function with EF 60 to 86% and diastolic function could not be assessed. EKG without acute ischemic changes. -Management of anemia as mentioned below -IV Lasix 40 mg  twice daily.  Transition to oral today. -Low suspicion for pneumonia.  -Continuous pulse ox, supplemental oxygen if needed  Recurrent right pleural effusion. S/P pleural catheter  placement IR consulted since there is more fluid collection. IR feels that the fluid collection is stable.  Repeat chest x-ray shows actually no fluid.  No further work-up. Appreciate their assistance.  Left upper extremity DVT. Only limited to the brachial vein.  Subclavian and IJ are compressible without any clot. At present we will manage this conservatively given patient's presentation with acute upper GI bleed life-threatening low hemoglobin. Repeat upper extremity Doppler on 03/12/2019 shows no evidence of progression actually no evidence of clot as well. We will continue conservative management for now.  Outpatient follow-up with GI.  Outpatient follow-up with hematology.  Acute metabolic encephalopathy. Patient is somewhat confused and lethargic. Likely secondary to medication but also metabolic component cannot be ruled out. ABG unremarkable. Atarax discontinued.  Abnormal urinalysis UA with moderate amount of leukocytes, 0-5 WBCs, rare bacteria, and negative nitrite.  Afebrile and no leukocytosis.  Patient is not endorsing any UTI symptoms. -Urine culture no significant growth  CKD stage III -Creatinine 2.2, stable since labs done 6 days ago. -Continue to monitor renal function renal function mildly worsening.  Will transition to oral Lasix.  Stage III squamous cell carcinoma of the lung Being followed by Dr. [name] from oncology and currently on immunotherapy. Has recurrent pleural effusion with right-sided Pleurx catheter. -Oncology follow-up  Well-controlled insulin-dependent diabetes mellitus -A1c 5.8 on 8/14.  Sliding scale   Hypertension -Hold off home antihypertensives at this time given soft blood pressure  Morbid obesity Concern for OSA/obesity hypoventilation syndrome Body mass index is 38.82 kg/m.  May benefit from outpatient sleep study.  Left humerus fracture. Pain control with Norco. Any narcotic causes itching for this patient. Patient was  provided Atarax but with that causes confusion.  Currently using calamine lotion. If the patient has persistent itching despite Atarax will use nightly as needed Atarax.  Diet: Cardiac diet DVT Prophylaxis: SCD, pharmacological prophylaxis contraindicated due to Active GI bleed  Advance goals of care discussion: Full code  Family Communication: family was present at bedside, at the time of interview. The pt provided permission to discuss medical plan with the family. Opportunity was given to ask question and all questions were answered satisfactorily.   Disposition:  Discharge to Home .  Consultants: gastroenterology [facility] Procedures: none  Scheduled Meds: . calamine   Topical TID  . furosemide  40 mg Oral BID  . insulin aspart  0-15 Units Subcutaneous TID WC  . insulin aspart  0-5 Units Subcutaneous QHS  . pantoprazole  40 mg Oral BID AC  . potassium chloride  40 mEq Oral Daily  . senna-docusate  1 tablet Oral BID  . sodium chloride flush  10-40 mL Intracatheter Q12H   Continuous Infusions: . sodium chloride 250 mL (03/11/19 0812)   PRN Meds: sodium chloride, albuterol, HYDROcodone-acetaminophen, ondansetron (ZOFRAN) IV, sodium chloride flush Antibiotics: Anti-infectives (From admission, onward)   Start     Dose/Rate Route Frequency Ordered Stop   03/07/19 0030  cefTRIAXone (ROCEPHIN) 1 g in sodium chloride 0.9 % 100 mL IVPB  Status:  Discontinued     1 g 200 mL/hr over 30 Minutes Intravenous Daily at bedtime 03/07/19 0016 03/07/19 0046   03/07/19 0030  azithromycin (ZITHROMAX) 500 mg in sodium chloride 0.9 % 250 mL IVPB  Status:  Discontinued     500 mg 250 mL/hr over 60 Minutes Intravenous Daily at bedtime 03/07/19 0016 03/07/19 0046       Objective: Physical Exam: Vitals:   03/11/19 2107 03/12/19 0342 03/12/19 0923 03/12/19 1305  BP: 130/69 140/76 138/78 120/65  Pulse: 84 88 89 87  Resp: 16 16 20  (!) 21  Temp: 97.8 F (36.6 C) 98.7 F (37.1 C) (!) 97.5 F (36.4  C) 98.1 F (36.7 C)  TempSrc: Oral Oral Oral   SpO2: 100% 100% 100% 100%  Weight:  115.8 kg    Height:        Intake/Output Summary (Last 24 hours) at 03/12/2019 1804 Last data filed at 03/12/2019 1543 Gross per 24 hour  Intake 1105 ml  Output 1000 ml  Net 105 ml   Filed Weights   03/10/19 0411 03/11/19 0543 03/12/19 0342  Weight: 118.6 kg 117.6 kg 115.8 kg   General: alert and oriented to time and place. Appear in moderate distress, affect appropriate Eyes: PERRL, Conjunctiva normal ENT: Oral Mucosa Clear, moist  Neck: no JVD, no Abnormal Mass Or lumps Cardiovascular: S1 and S2 Present, no Murmur, peripheral pulses symmetrical Respiratory: increased respiratory effort, Bilateral Air entry equal and Decreased, no use of accessory muscle, bilateral  Crackles, no wheezes Abdomen: Bowel Sound present, Soft and no tenderness, no hernia Skin: no rashes  Extremities: bilateral  Pedal edema, no calf tenderness, left upper extremity swelling as well. Neurologic: normal without focal findings, mental status, speech normal, alert and oriented x3, PERLA, Motor strength 5/5 and symmetric and sensation grossly normal to light touch Gait not checked due to patient safety concerns  Data Reviewed: CBC: Recent Labs  Lab 03/06/19 2021  03/08/19 0458 03/09/19 0346 03/10/19 0436 03/11/19 0343 03/12/19 0345  WBC 5.9   < > 4.2 4.7 5.4 5.3 7.1  NEUTROABS 4.9  --   --   --   --   --   --  HGB 5.0*   < > 8.0* 8.0* 7.8* 7.5* 8.1*  HCT 15.7*   < > 24.4* 24.5* 24.4* 23.5* 25.8*  MCV 92.9   < > 90.4 91.1 91.7 93.3 93.1  PLT 191   < > 215 203 204 190 234   < > = values in this interval not displayed.   Basic Metabolic Panel: Recent Labs  Lab 03/08/19 0458 03/09/19 0933 03/09/19 1847 03/10/19 1150 03/12/19 1107  NA 142 136 137 139 136  K 3.2* 3.3* 3.5 3.7 3.4*  CL 105 101 103 104 102  CO2 25 24 24 24 24   GLUCOSE 115* 118* 137* 110* 147*  BUN 81* 70* 58* 49* 34*  CREATININE 1.97* 1.98*  1.81* 1.73* 1.67*  CALCIUM 9.0 8.8* 8.9 8.8* 8.5*  MG 2.5* 2.4  --  2.3 2.1    Liver Function Tests: Recent Labs  Lab 03/06/19 2021 03/08/19 0458  AST 46* 37  ALT 26 26  ALKPHOS 33* 34*  BILITOT 1.2 2.0*  PROT 6.1* 6.7  ALBUMIN 3.1* 3.2*   No results for input(s): LIPASE, AMYLASE in the last 168 hours. Recent Labs  Lab 03/09/19 2000  AMMONIA 38*   Coagulation Profile: Recent Labs  Lab 03/06/19 2258  INR 1.3*   Cardiac Enzymes: Recent Labs  Lab 03/10/19 1150  CKTOTAL 108   BNP (last 3 results) No results for input(s): PROBNP in the last 8760 hours. CBG: Recent Labs  Lab 03/11/19 1634 03/11/19 2132 03/12/19 0808 03/12/19 1155 03/12/19 1635  GLUCAP 131* 127* 103* 116* 116*   Studies: Vas [place] Upper Extremity Venous Duplex  Result Date: 03/12/2019 UPPER VENOUS STUDY  Indications: Pain, Edema, and Broken shoulder. Possible brachial vein DVT found 03/08/19 Comparison Study: Prior study from 03/08/19 is available for comparison Performing Technologist: Sharion Dove RVS  Examination Guidelines: A complete evaluation includes B-mode imaging, spectral Doppler, color Doppler, and power Doppler as needed of all accessible portions of each vessel. Bilateral testing is considered an integral part of a complete examination. Limited examinations for reoccurring indications may be performed as noted.  Right Findings: +----------+------------+---------+-----------+----------+-------+ RIGHT     CompressiblePhasicitySpontaneousPropertiesSummary +----------+------------+---------+-----------+----------+-------+ Subclavian               Yes       Yes                      +----------+------------+---------+-----------+----------+-------+  Left Findings: +----------+------------+---------+-----------+----------+---------------+ LEFT      CompressiblePhasicitySpontaneousProperties    Summary     +----------+------------+---------+-----------+----------+---------------+  IJV           Full       Yes       Yes                              +----------+------------+---------+-----------+----------+---------------+ Subclavian               Yes       Yes                              +----------+------------+---------+-----------+----------+---------------+ Axillary                 Yes       Yes                              +----------+------------+---------+-----------+----------+---------------+ Brachial  Full       Yes       Yes                              +----------+------------+---------+-----------+----------+---------------+ Radial                                              patent by color +----------+------------+---------+-----------+----------+---------------+ Ulnar                                               patent by color +----------+------------+---------+-----------+----------+---------------+ Cephalic      Full                                                  +----------+------------+---------+-----------+----------+---------------+ Basilic       Full                                                  +----------+------------+---------+-----------+----------+---------------+  Summary:  Right: No evidence of thrombosis in the subclavian.  Left: No evidence of deep vein thrombosis in the upper extremity. No evidence of superficial vein thrombosis in the upper extremity. Possible DVT found in one of the brachial veins 03/08/19, appears resolved.  *See table(s) above for measurements and observations.    Preliminary      Time spent: 35 minutes  Author: Berle Mull, MD [facility] 03/12/2019 6:04 PM  To reach On-call, see care teams to locate the attending and reach out to them via [email or web address]. If 7PM-7AM, please contact night-coverage If you still have difficulty reaching the attending provider, please page the [facility] (Director on Call) for [facility] on amion for assistance.

## 2019-03-12 NOTE — Progress Notes (Signed)
VASCULAR LAB PRELIMINARY  PRELIMINARY  PRELIMINARY  PRELIMINARY  Left upper extremity venous duplex completed.    Preliminary report:  See CV proc for preliminary results.  Gave Dr. Posey Pronto results.   Shourya Macpherson, RVT 03/12/2019, 11:21 AM

## 2019-03-12 NOTE — Care Management Important Message (Signed)
Important Message  Patient Details IM Letter given to Dessa Phi RN to present to the Patient Name: Darrell [name] MRN: [id number] Date of Birth: 10-Mar-1946   Medicare Important Message Given:  Yes     Kerin Salen 03/12/2019, 12:19 PM

## 2019-03-13 LAB — BASIC METABOLIC PANEL
Anion gap: 9 (ref 5–15)
BUN: 30 mg/dL — ABNORMAL HIGH (ref 8–23)
CO2: 22 mmol/L (ref 22–32)
Calcium: 8.7 mg/dL — ABNORMAL LOW (ref 8.9–10.3)
Chloride: 104 mmol/L (ref 98–111)
Creatinine, Ser: 1.48 mg/dL — ABNORMAL HIGH (ref 0.61–1.24)
GFR calc Af Amer: 55 mL/min — ABNORMAL LOW (ref >60)
GFR calc non Af Amer: 47 mL/min — ABNORMAL LOW (ref >60)
Glucose, Bld: 121 mg/dL — ABNORMAL HIGH (ref 70–99)
Potassium: 3.7 mmol/L (ref 3.5–5.1)
Sodium: 135 mmol/L (ref 135–145)

## 2019-03-13 LAB — CBC
HCT: 24.7 % — ABNORMAL LOW (ref 39.0–52.0)
Hemoglobin: 7.7 g/dL — ABNORMAL LOW (ref 13.0–17.0)
MCH: 29.1 pg (ref 26.0–34.0)
MCHC: 31.2 g/dL (ref 30.0–36.0)
MCV: 93.2 fL (ref 80.0–100.0)
Platelets: 223 10*3/uL (ref 150–400)
RBC: 2.65 MIL/uL — ABNORMAL LOW (ref 4.22–5.81)
RDW: 18.6 % — ABNORMAL HIGH (ref 11.5–15.5)
WBC: 6.8 10*3/uL (ref 4.0–10.5)
nRBC: 0 % (ref 0.0–0.2)

## 2019-03-13 LAB — GLUCOSE, CAPILLARY
Glucose-Capillary: 116 mg/dL — ABNORMAL HIGH (ref 70–99)
Glucose-Capillary: 145 mg/dL — ABNORMAL HIGH (ref 70–99)

## 2019-03-13 LAB — MAGNESIUM: Magnesium: 2.1 mg/dL (ref 1.7–2.4)

## 2019-03-13 MED ORDER — HYDROCODONE-ACETAMINOPHEN 5-325 MG PO TABS: 1.0000 | ORAL_TABLET | Freq: Four times a day (QID) | ORAL | 0 refills | Status: AC | PRN

## 2019-03-13 MED ORDER — HEPARIN SOD (PORK) LOCK FLUSH 100 UNIT/ML IV SOLN
500.0000 [IU] | INTRAVENOUS | Status: AC | PRN
  Administered 2019-03-13: 500 [IU]

## 2019-03-13 MED ORDER — PANTOPRAZOLE SODIUM 40 MG PO TBEC: 40.0000 mg | DELAYED_RELEASE_TABLET | Freq: Two times a day (BID) | ORAL | 0 refills | Status: DC

## 2019-03-13 MED ORDER — CALAMINE EX LOTN: TOPICAL_LOTION | Freq: Three times a day (TID) | CUTANEOUS | 0 refills | Status: DC

## 2019-03-13 MED ORDER — FUROSEMIDE 40 MG PO TABS: 40.0000 mg | ORAL_TABLET | Freq: Two times a day (BID) | ORAL | 0 refills | Status: DC

## 2019-03-13 MED ORDER — POTASSIUM CHLORIDE ER 20 MEQ PO TBCR: 20.0000 meq | EXTENDED_RELEASE_TABLET | Freq: Every day | ORAL | 0 refills | Status: DC

## 2019-03-13 NOTE — Progress Notes (Signed)
PT Note  Patient Details Name: Darrell [name] MRN: [id number] DOB: 1946/05/04  Pt evaluated and full note to follow.  Pt discharging today and would benefit from f/u OP PT once seen again by ortho MD.  Recommending w/c and BSC for home equipment.  Patient suffers from Stage III squamous cell carcinoma of the lung and recent left proximal humerus fracture which impairs their ability to perform daily activities like ambulating and safely performing ADLs in the home.  A walker alone will not resolve the issues with performing activities of daily living. A wheelchair will allow patient to safely perform daily activities.  The patient can self propel in the home or has a caregiver who can provide assistance.      Carolle Ishii,KATHrine E 03/13/2019, 11:27 AM Carmelia Bake, PT, DPT Acute Rehabilitation Services Office: [phone] Pager: [phone]

## 2019-03-13 NOTE — Evaluation (Signed)
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 [name] from oncology and currently on immunotherapy, recurrent pleural effusion with right-sided Pleurx catheter, CKD stage III, type 2 diabetes, hypertension, hyperlipidemia, thoracic aortic aneurysm, recent left proximal humerus fracture; admitted on 03/06/2019, presented with complaint of shortness of breath, was found to have symptomatic anemia as well as recurrent pleural effusion and acute on chronic CHF  Clinical Impression  Patient evaluated by Physical Therapy with no further acute PT needs identified. All education has been completed and the patient has no further questions.  Pt only tolerating short distances of ambulation and declining with functional mobility at home since recent admission.  Pt and spouse educated on carefully elevating L UE due to edema and pt reports he has been performing hand and wrist active movement to also assist with swelling.  Pt to d/c home today.  See below for any follow-up Physical Therapy or equipment needs. PT is signing off. Thank you for this referral.     Follow Up Recommendations Supervision/Assistance - 24 hour;Home health PT(was receiving HHPT, recommended f/u with ortho MD and possible OP PT for L shoulder)    Equipment Recommendations  Wheelchair (measurements PT);3in1 (PT)    Recommendations for Other Services       Precautions / Restrictions Precautions Precautions: Fall Precaution Comments: fell in late July, sustained L proximal humerus fx (NWB and sling since, has not been back to f/u with ortho) Required Braces or Orthoses: Sling(sling at home) Restrictions Other Position/Activity Restrictions: assume NWB LUE      Mobility  Bed Mobility                General bed mobility comments: oob in recliner  Transfers Overall transfer level: Needs assistance Equipment used: None Transfers: Sit to/from Stand Sit to Stand: Min guard;Supervision         General transfer comment: pt with proper use of RUE to push up to standing.  Ambulation/Gait Ambulation/Gait assistance: Min guard;Supervision Gait Distance (Feet): 120 Feet Assistive device: None Gait Pattern/deviations: Step-through pattern;Decreased stride length;Wide base of support     General Gait Details: moderately unsteady however no overt LOB or need for assist; fatigues very quickly, mild SOB near end of ambulation  Stairs            Wheelchair Mobility    Modified Rankin (Stroke Patients Only)       Balance                                             Pertinent Vitals/Pain Pain Assessment: No/denies pain    Home Living Family/patient expects to be discharged to:: Private residence Living Arrangements: Spouse/significant other Available Help at Discharge: Family Type of Home: House Home Access: Level entry     Home Layout: One level Home Equipment: Cane - quad      Prior Function Level of Independence: Independent with assistive device(s)         Comments: using quad cane for longer distances prior to last admission; since last admission, pt and spouse reports not being able to make MD appointments due to distance from condo to car - spouse  very fearful of pt falling     Hand Dominance   Dominant Hand: Right    Extremity/Trunk Assessment   Upper Extremity Assessment Upper Extremity Assessment: LUE deficits/detail LUE Deficits / Details: hx of proximal humeral fx; educated on carefully propping L UE to position above heart due to significant edema            Communication   Communication: No difficulties  Cognition Arousal/Alertness: Awake/alert Behavior During Therapy: WFL for tasks assessed/performed Overall  Cognitive Status: Within Functional Limits for tasks assessed                                        General Comments      Exercises     Assessment/Plan    PT Assessment All further PT needs can be met in the next venue of care  PT Problem List Decreased strength;Decreased mobility;Decreased activity tolerance;Decreased balance;Pain;Obesity       PT Treatment Interventions      PT Goals (Current goals can be found in the Care Plan section)  Acute Rehab PT Goals PT Goal Formulation: All assessment and education complete, DC therapy    Frequency     Barriers to discharge        Co-evaluation               AM-PAC PT "6 Clicks" Mobility  Outcome Measure Help needed turning from your back to your side while in a flat bed without using bedrails?: A Little Help needed moving from lying on your back to sitting on the side of a flat bed without using bedrails?: A Little Help needed moving to and from a bed to a chair (including a wheelchair)?: A Little Help needed standing up from a chair using your arms (e.g., wheelchair or bedside chair)?: A Little Help needed to walk in hospital room?: A Little Help needed climbing 3-5 steps with a railing? : A Little 6 Click Score: 18    End of Session Equipment Utilized During Treatment: Gait belt Activity Tolerance: Patient tolerated treatment well Patient left: in chair;with call bell/phone within reach;with family/visitor present   PT Visit Diagnosis: Difficulty in walking, not elsewhere classified (R26.2);History of falling (Z91.81)    Time: 1101-1120 PT Time Calculation (min) (ACUTE ONLY): 19 min   Charges:   PT Evaluation $PT Eval Low Complexity: [place], PT, DPT Acute Rehabilitation Services Office: [phone] Pager: [phone]  Trena Platt 03/13/2019, 12:56 PM

## 2019-03-13 NOTE — TOC Transition Note (Signed)
Transition of Care [facility]) - CM/SW Discharge Note   Patient Details  Name: Darrell [name] MRN: [id number] Date of Birth: [birth year]  [facility] CM/SW Contact:  Dessa Phi, RN Phone Number: 03/13/2019, 11:48 AM   Clinical Narrative: Spoke to patient/spouse in rm to confirm they only had [place] eval prior to this admission-they agreed to [facility] w/[facility] rep Santiago Glad aware @ this d/c-MD notified for order.Also PT recc w/c, 3n1-MD to order. DME rep Edwyna Ready will deliver dme to rm prior d/c. Family to transport home. No further CM needs.      Final next level of care: [place] Barriers to Discharge: No Barriers Identified   Patient Goals and CMS Choice Patient states their goals for this hospitalization and ongoing recovery are:: go home CMS Medicare.gov Compare Post Acute Care list provided to:: Patient Choice offered to / list presented to : Patient  Discharge Placement                       Discharge Plan and Services   Discharge Planning Services: CM Consult Post Acute Care Choice: Home Health          DME Arranged: Lightweight manual wheelchair with seat cushion, 3-N-1 DME Agency: [employer] Date DME Agency Contacted: 03/13/19 Time DME Agency Contacted: 30 Representative spoke with at DME Agency: [place]: RN, PT [place] Agency: [place] ([facility]) Date [place]: 03/13/19 Time [place]: [id number] Representative spoke with at [place]: [place] ([place]) Interventions     Readmission Risk Interventions Readmission Risk Prevention Plan 02/23/2019  Transportation Screening Complete  PCP or Specialist Appt within 3-5 Days Not Complete  Not Complete comments Not ready for dc  HRI or [place] Not Complete  HRI or Home Care Consult comments NA  Social Work Consult for [place] Planning/Counseling Not Complete  SW consult not completed comments NA  Palliative Care  Screening Not Applicable  Medication Review Press photographer) Complete  Some recent data might be hidden

## 2019-03-13 NOTE — Care Management (Cosign Needed)
    Durable Medical Equipment  (From admission, onward)         Start     Ordered   03/13/19 1205  For home use only DME 3 n 1  Once     03/13/19 1205   03/13/19 1204  For home use only DME standard manual wheelchair with seat cushion  Once    Comments: Patient suffers from CHF and humerus fracture which impairs their ability to perform daily activities like bathing, dressing, feeding, grooming and toileting in the home.  A cane, crutch or walker will not resolve issue with performing activities of daily living. A wheelchair will allow patient to safely perform daily activities. Patient can safely propel the wheelchair in the home or has a caregiver who can provide assistance. Length of need 6 months . Accessories: elevating leg rests (ELRs), wheel locks, extensions and anti-tippers.   03/13/19 1204   03/13/19 1204  For home use only DME wheelchair cushion (seat and back)  Once     03/13/19 1204

## 2019-03-15 ENCOUNTER — Telehealth: Payer: Self-pay | Admitting: Medical Oncology

## 2019-03-15 NOTE — Telephone Encounter (Signed)
Ok to resume nursing care and referral for social work due to frequent hospital readmissions and LTC planning.

## 2019-03-15 NOTE — Discharge Summary (Signed)
[facility] Discharge Summary   Patient: Darrell [name] TKZ:[id number]   PCP: [name], MD DOB: Feb 05, 1946   Date of admission: 03/06/2019   Date of discharge: 03/13/2019     Discharge Diagnoses:  Acute on chronic diastolic CHF  Principal Problem:   Dyspnea Active Problems:   DM2 (diabetes mellitus, type 2) (HCC)   HYPERTENSION, BENIGN   CKD (chronic kidney disease) stage 3, GFR 30-59 ml/min (HCC)   Anemia   Acute upper GI bleed   Admitted From: Home Disposition:  Home   Recommendations for Outpatient Follow-up:  1. Please follow-up with PCP in 1 week. 2. Follow-up with [facility] gastroenterology in 1 month. 3. Follow-up with cardiology as recommended.  Follow-up Information    [facility] Follow up.   Specialty: [place] Why: [facility] nursing/physical therapy       [facility], [facility] Follow up.   Why: wheelchair with cusion, bedside commode. Contact information: [place] [address] [phone]        [name], MD. Schedule an appointment as soon as possible for a visit in 1 week(s).   Specialty: Internal Medicine Contact information: [place] [address] [phone]        Jerline Pain, MD. Schedule an appointment as soon as possible for a visit in 2 month(s).   Specialty: Cardiology Contact information: [address] [place] [address] [phone]          Diet recommendation: Cardiac diet  Activity: The patient is advised to gradually reintroduce usual activities,as tolerated .  Discharge Condition: good  Code Status: Full code  History of present illness: As per the H and P dictated on admission, "Darrell [name] is a 73 y.o. 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 [name] from oncology and currently on immunotherapy, recurrent pleural effusion with  right-sided Pleurx catheter, CKD stage III, type 2 diabetes, hypertension, hyperlipidemia, thoracic aortic aneurysm, recent left proximal humerus fracture presenting to the hospital via EMS for evaluation of shortness of breath.  Patient reports 31-month history of dyspnea on exertion.  Denies any fevers, chills, chest pain, or cough.  States his legs are chronically swollen, currently improved.  Denies orthopnea.  States his Pleurx catheter has not been draining well recently.  Normally drains 1 L fluid in 3 days but recently has only been draining 100 cc every 3 days.  Denies noticing any blood in his stool or dark stools.  Denies any abdominal pain or hematemesis.  Denies history of prior GI bleed.  He has been taking Advil for shoulder pain from his fracture as oxycodone makes him itch.  Denies dysuria.  No other complaints. "  Hospital Course:  Summary of his active problems in the hospital is as following. Symptomatic anemia.  Secondary to acute upper GI bleeding. Baseline hemoglobin 8.8.  Currently hemoglobin stable. Presented with hemoglobin of 5.0. S/P 3 PRBC Continue Protonix every 12 hours. [facility] gastro[facility]ology recommends conservative management without any intervention for now.  Acute on chronic diastolic CHF. BNP 334, improved since labs done 12 days ago. Echo done February 23, 2019 showing normal systolic function with EF 60 to 62% and diastolic function could not be assessed.EKG without acute ischemic changes. -Management of anemia as mentioned below -IV Lasix 40 mg twice daily.  Transition to oral -Low suspicion for pneumonia.  Recurrent right pleural effusion. S/P pleural  catheter placement IR consulted since there is more fluid collection. IR feels that the fluid collection is stable.  Repeat chest x-ray shows actually no fluid.  No further work-up. Appreciate their assistance.  Left upper extremity DVT. Only limited to the brachial vein.  Subclavian and IJ are  compressible without any clot. At present we will manage this conservatively given patient's presentation with acute upper GI bleed life-threatening low hemoglobin. Repeat upper extremity Doppler on 03/12/2019 shows no evidence of progression actually no evidence of clot as well. We will continue conservative management for now.  Outpatient follow-up with GI.  Outpatient follow-up with hematology.  Acute metabolic encephalopathy. Patient is somewhat confused and lethargic. Likely secondary to medication but also metabolic component cannot be ruled out. ABG unremarkable. Atarax discontinued.  Abnormal urinalysis UA with moderate amount of leukocytes, 0-5 WBCs, rare bacteria, and negative nitrite.Afebrile and no leukocytosis.Patient is not endorsing any UTI symptoms. -Urine culture no significant growth  AKI on CKD stage III -Creatinine 2.2, stable since labs done 6 days ago. -Continue to monitor renal function renal function Improved significantly from admission.  Likely cardiorenal hemodynamics  Will transition to oral Lasix.  Stage III squamous cell carcinoma of the lung Beingfollowed by Dr. [name] from oncology and currently on immunotherapy. Hasrecurrent pleural effusion with right-sided Pleurx catheter. -Oncology follow-up  Well-controlled insulin-dependent diabetes mellitus -A1c 5.8 on 8/14. Sliding scale   Hypertension -Hold off home antihypertensives at this time givensoft blood pressure  Morbid obesity Concern for OSA/obesity hypoventilation syndrome Body mass index is 38.82 kg/m.  May benefit from outpatient sleep study.  Left humerus fracture. Pain control with Norco. Any narcotic causes itching for this patient. Patient was provided Atarax but with that causes confusion. Currently using calamine lotion. If the patient has persistent itching despite Atarax will use nightly as needed Atarax. - [place] Controlled Substance Reporting System  database was reviewed. - 5 day supply was provided. - Patient was instructed, not to drive, operate heavy machinery, perform activities at heights, swimming or participation in water activities or provide baby sitting services while on Pain, Sleep and Anxiety Medications; until his outpatient Physician has advised to do so again.  - Also recommended to not to take more than prescribed Pain, Sleep and Anxiety Medications.  Patient was seen by physical therapy, who recommended Home health, which was arranged by case manager. On the day of the discharge the patient's vitals were stable, and no other acute medical condition were reported by patient. the patient was felt safe to be discharge at Home with Home health.  Consultants: none Procedures: none  DISCHARGE MEDICATION: Allergies as of 03/13/2019      Reactions   Percocet [oxycodone-acetaminophen] Itching   Lisinopril Cough         Medication List    STOP taking these medications   cloNIDine 0.1 MG tablet Commonly known as: CATAPRES   Coreg 6.25 MG tablet Generic drug: carvedilol   oxyCODONE-acetaminophen 5-325 MG tablet Commonly known as: PERCOCET/ROXICET   traMADol 50 MG tablet Commonly known as: ULTRAM     TAKE these medications   albuterol 108 (90 Base) MCG/ACT inhaler Commonly known as: VENTOLIN HFA Inhale 2 puffs into the lungs every 4 (four) hours as needed for wheezing or shortness of breath.   calamine lotion Apply topically 3 (three) times daily.   fenofibrate 160 MG tablet Take 160 mg by mouth daily.   Fifty50 Pen Needles 31G X 8 MM Misc Generic drug: Insulin Pen Needle USE AS  DIRECTED   furosemide 40 MG tablet Commonly known as: LASIX Take 1 tablet (40 mg total) by mouth 2 (two) times daily. Take additional 40 mg tablet/day if weight is up more than 3 lbs in a day or 5 lbs in 2 day. What changed: additional instructions   HYDROcodone-acetaminophen 5-325 MG tablet Commonly known as: NORCO/VICODIN Take  1 tablet by mouth every 6 (six) hours as needed for up to 5 days for moderate pain or severe pain.   Levemir FlexTouch 100 UNIT/ML Pen Generic drug: Insulin Detemir Inject 10 Units into the skin daily.   lidocaine-prilocaine cream Commonly known as: EMLA Apply 1 application topically as needed. What changed: reasons to take this   Multi-Vitamins Tabs Take 1 tablet by mouth daily.   nystatin powder Commonly known as: MYCOSTATIN/NYSTOP Apply topically 3 (three) times daily.   Omega-3 1000 MG Caps Take 1,200 mg by mouth 2 (two) times daily.   pantoprazole 40 MG tablet Commonly known as: PROTONIX Take 1 tablet (40 mg total) by mouth 2 (two) times daily before a meal for 14 days.   polyethylene glycol 17 g packet Commonly known as: MIRALAX / GLYCOLAX Take 17 g by mouth daily.   Potassium Chloride ER 20 MEQ Tbcr Take 20 mEq by mouth daily for 7 days.   pravastatin 40 MG tablet Commonly known as: PRAVACHOL Take 40 mg by mouth daily.   prochlorperazine 10 MG tablet Commonly known as: COMPAZINE TAKE 1 TABLET BY MOUTH EVERY 6 HOURS AS NEEDED FOR NAUSEA OR VOMITING What changed: See the new instructions.   senna-docusate 8.6-50 MG tablet Commonly known as: Senokot-S Take 2 tablets by mouth 2 (two) times daily.      Allergies  Allergen Reactions  . Percocet [Oxycodone-Acetaminophen] Itching  . Lisinopril Cough        Discharge Instructions    Diet - low sodium heart healthy   Complete by: As directed    Discharge instructions   Complete by: As directed    It is important that you read the given instructions as well as go over your medication list with RN to help you understand your care after this hospitalization.  Discharge Instructions: Please follow-up with PCP in 1-2 weeks  Please request your primary care physician to go over all Hospital Tests and Procedure/Radiological results at the follow up. Please get all Hospital records sent to your PCP by signing  hospital release before you go home.   Do not drive, operating heavy machinery, perform activities at heights, swimming or participation in water activities or provide baby sitting services while you are on Pain, Sleep and Anxiety Medications; until you have been seen by Primary Care Physician or a Neurologist and advised to do so again. Do not take more than prescribed Pain, Sleep and Anxiety Medications. You were cared for by a hospitalist during your hospital stay. If you have any questions about your discharge medications or the care you received while you were in the hospital after you are discharged, you can call the unit @UNIT @ you were admitted to and ask to speak with the hospitalist on call if the hospitalist that took care of you is not available.  Once you are discharged, your primary care physician will handle any further medical issues. Please note that NO REFILLS for any discharge medications will be authorized once you are discharged, as it is imperative that you return to your primary care physician (or establish a relationship with a primary care physician if you  do not have one) for your aftercare needs so that they can reassess your need for medications and monitor your lab values. You Must read complete instructions/literature along with all the possible adverse reactions/side effects for all the Medicines you take and that have been prescribed to you. Take any new Medicines after you have completely understood and accept all the possible adverse reactions/side effects. Wear Seat belts while driving. If you have smoked or chewed Tobacco in the last 2 yrs please stop smoking and/or stop any Recreational drug use.  If you drink alcohol, please STOP the use and do not drive, operating heavy machinery, perform activities at heights, swimming or participation in water activities or provide baby sitting services under influence.   Increase activity slowly   Complete by: As directed       Discharge Exam: Filed Weights   03/10/19 0411 03/11/19 0543 03/12/19 0342  Weight: 118.6 kg 117.6 kg 115.8 kg   Vitals:   03/12/19 2147 03/13/19 0533  BP: (!) 146/76 (!) 144/77  Pulse: 86 88  Resp: 14 18  Temp: 98.3 F (36.8 C) 98.4 F (36.9 C)  SpO2: 100% 99%   General: Appear in no distress, no Rash; Oral Mucosa Clear, moist. no Abnormal Mass Or lumps Cardiovascular: S1 and S2 Present, no Murmur, Respiratory: normal respiratory effort, Bilateral Air entry present and Clear to Auscultation, no Crackles, no wheezes Abdomen: Bowel Sound present, Soft and no tenderness, no hernia Extremities: bilateral  Pedal edema, no calf tenderness Neurology: alert and oriented to time, place, and person affect appropriate. normal without focal findings, mental status, speech normal, alert and oriented x3, PERLA, Motor strength 5/5 and symmetric and sensation grossly normal to light touch   The results of significant diagnostics from this hospitalization (including imaging, microbiology, ancillary and laboratory) are listed below for reference.    Significant Diagnostic Studies: Dg Chest 2 View  Result Date: 03/08/2019 CLINICAL DATA:  Shortness of breath EXAM: CHEST - 2 VIEW COMPARISON:  03/06/2019 FINDINGS: Small right pleural effusion. Right perihilar airspace disease unchanged from the prior exam. Left lung is clear. No pneumothorax. Stable heart size. Left-sided Port-A-Cath in satisfactory position. IMPRESSION: 1. Small right pleural effusion. Persistent right perihilar airspace disease unchanged compared with 03/06/2019. Electronically Signed   By: Kathreen Devoid   On: 03/08/2019 12:19   Dg Chest 2 View  Result Date: 02/22/2019 CLINICAL DATA:  Shortness of breath. EXAM: CHEST - 2 VIEW COMPARISON:  January 31, 2019 FINDINGS: Injectable port in stable position. Normal cardiac silhouette. Stable volume loss in the right hemithorax with loculated pleural effusion. Stable soft tissue thickening in the  right perihilar region. The left lung is clear. Osseous structures are without acute abnormality. Soft tissues are grossly normal. IMPRESSION: 1. Stable volume loss in the right hemithorax with loculated pleural effusion. 2. Stable soft tissue thickening in the right perihilar region. Electronically Signed   By: Fidela Salisbury M.D.   On: 02/22/2019 15:58   Dg Chest Port 1 View  Result Date: 03/10/2019 CLINICAL DATA:  Shortness of breath. EXAM: PORTABLE CHEST 1 VIEW COMPARISON:  Chest x-ray dated March 08, 2019. FINDINGS: Unchanged left chest wall port catheter. Stable cardiomediastinal silhouette. Right perihilar airspace disease has improved. Unchanged small right pleural effusion. The left lung is clear. No pneumothorax. No acute osseous abnormality. IMPRESSION: 1. Slightly improved right perihilar airspace disease. Unchanged small right pleural effusion. Electronically Signed   By: Titus Dubin M.D.   On: 03/10/2019 14:03   Dg Chest  Portable 1 View  Addendum Date: 03/06/2019   ADDENDUM REPORT: 03/06/2019 20:49 ADDENDUM: These results were called by telephone at the time of interpretation on 03/06/2019 at 8:49 pm to Dr. Coral Ceo , who verbally acknowledged these results. Electronically Signed   By: Lovena Le M.D.   On: 03/06/2019 20:49   Result Date: 03/06/2019 CLINICAL DATA:  Shortness of breath, history of lung cancer, shortness of breath for 3 days upper and lower extremity edema. EXAM: PORTABLE CHEST 1 VIEW COMPARISON:  Radiograph 01/31/2019 FINDINGS: Left subclavian approach Port-A-Cath tip terminates at the right atrium. Right chest tube is positioned in the right lung base. There is persistent consolidative opacity in the right perihilar region with a moderate right pleural effusion which layers posteriorly resulting in a gradient density of the right hemithorax. The left lung remains largely clear. Cardiomediastinal contours are unchanged from prior. There is a new, comminuted  fracture of the proximal left humerus which appears to extend through the surgical and anatomic necks involving the greater tuberosity. Inferior positioning of the humeral head relative to the left glenoid may reflect a left joint effusion. IMPRESSION: Redemonstrated consolidative opacity in the right mid lung with moderate right pleural effusion despite a right pleural drain in place. Comminuted fracture of the proximal left humerus. Inferior subluxation of the humeral head may reflect joint effusion. Electronically Signed: By: Lovena Le M.D. On: 03/06/2019 20:44   Dg Chest Port 1 View  Result Date: 02/26/2019 CLINICAL DATA:  Shortness of breath EXAM: PORTABLE CHEST 1 VIEW COMPARISON:  01/16/2019 chest CT FINDINGS: Decreased pleuroparenchymal opacity at the right base. There is right perihilar radiation fibrosis. Cardiomegaly. Unremarkable left-sided porta catheter. IMPRESSION: 1. Decreased pleural fluid on the right. 2. Right perihilar fibrosis and chronic volume loss. Electronically Signed   By: Monte Fantasia M.D.   On: 02/26/2019 04:27   Dg Shoulder Left  Result Date: 02/25/2019 CLINICAL DATA:  Pain status post fall EXAM: LEFT SHOULDER - 2+ VIEW COMPARISON:  January 30, 2019 FINDINGS: There is a partially visualized left subclavian Port-A-Cath. Again noted is an impacted fracture of the left humeral head and neck. The fracture appears slightly more impacted on today's exam. There is mild surrounding soft tissue swelling. There is some evidence of mild interval callus formation. There is no glenohumeral dislocation. Osteopenia is noted. IMPRESSION: 1. Again noted is an impacted fracture of the proximal left humerus. There has been slight interval increase in impaction since the prior study. There is minimal surrounding callus formation. 2. No glenohumeral dislocation. Electronically Signed   By: Constance Holster M.D.   On: 02/25/2019 16:50   Vas [place] Upper Extremity Venous Duplex  Result Date:  03/12/2019 UPPER VENOUS STUDY  Indications: Pain, Edema, and Broken shoulder. Possible brachial vein DVT found 03/08/19 Comparison Study: Prior study from 03/08/19 is available for comparison Performing Technologist: Sharion Dove RVS  Examination Guidelines: A complete evaluation includes B-mode imaging, spectral Doppler, color Doppler, and power Doppler as needed of all accessible portions of each vessel. Bilateral testing is considered an integral part of a complete examination. Limited examinations for reoccurring indications may be performed as noted.  Right Findings: +----------+------------+---------+-----------+----------+-------+ RIGHT     CompressiblePhasicitySpontaneousPropertiesSummary +----------+------------+---------+-----------+----------+-------+ Subclavian               Yes       Yes                      +----------+------------+---------+-----------+----------+-------+  Left Findings: +----------+------------+---------+-----------+----------+---------------+ LEFT  CompressiblePhasicitySpontaneousProperties    Summary     +----------+------------+---------+-----------+----------+---------------+ IJV           Full       Yes       Yes                              +----------+------------+---------+-----------+----------+---------------+ Subclavian               Yes       Yes                              +----------+------------+---------+-----------+----------+---------------+ Axillary                 Yes       Yes                              +----------+------------+---------+-----------+----------+---------------+ Brachial      Full       Yes       Yes                              +----------+------------+---------+-----------+----------+---------------+ Radial                                              patent by color +----------+------------+---------+-----------+----------+---------------+ Ulnar                                                patent by color +----------+------------+---------+-----------+----------+---------------+ Cephalic      Full                                                  +----------+------------+---------+-----------+----------+---------------+ Basilic       Full                                                  +----------+------------+---------+-----------+----------+---------------+  Summary:  Right: No evidence of thrombosis in the subclavian.  Left: No evidence of deep vein thrombosis in the upper extremity. No evidence of superficial vein thrombosis in the upper extremity. Possible DVT found in one of the brachial veins 03/08/19, appears resolved.  *See table(s) above for measurements and observations.  Diagnosing physician: Monica Martinez MD Electronically signed by Monica Martinez MD on 03/12/2019 at 6:04:40 PM.    Final    Vas [place] Upper Extremity Venous Duplex  Result Date: 03/08/2019 UPPER VENOUS STUDY  Indications: Pain, and Left arm is broken Limitations: Broken arm, unable to move arm. Comparison Study: Prior study from 02/26/19 is available for comparison Performing Technologist: Sharion Dove RVS  Examination Guidelines: A complete evaluation includes B-mode imaging, spectral Doppler, color Doppler, and power Doppler as needed of all accessible portions of each vessel. Bilateral testing is considered an integral part of a complete examination. Limited examinations  for reoccurring indications may be performed as noted.  Right Findings: +----------+------------+---------+-----------+----------+-------+ RIGHT     CompressiblePhasicitySpontaneousPropertiesSummary +----------+------------+---------+-----------+----------+-------+ Subclavian               Yes       Yes                      +----------+------------+---------+-----------+----------+-------+  Left Findings: +----------+------------+---------+-----------+----------+--------------+ LEFT       CompressiblePhasicitySpontaneousProperties   Summary     +----------+------------+---------+-----------+----------+--------------+ IJV                      Yes       Yes                             +----------+------------+---------+-----------+----------+--------------+ Subclavian               Yes       Yes                             +----------+------------+---------+-----------+----------+--------------+ Axillary                 Yes       Yes                             +----------+------------+---------+-----------+----------+--------------+ Brachial      None       No        No                              +----------+------------+---------+-----------+----------+--------------+ Radial                                              Not visualized +----------+------------+---------+-----------+----------+--------------+ Ulnar                                               Not visualized +----------+------------+---------+-----------+----------+--------------+ Cephalic      Full                                                 +----------+------------+---------+-----------+----------+--------------+ Basilic       Full                                                 +----------+------------+---------+-----------+----------+--------------+  Summary:  Right: No evidence of thrombosis in the subclavian.  Left: However, unable to visualize the radial, ulnar veins. Findings consistent with acute deep vein thrombosis involving the left brachial veins. There is now DVT in the left brachial vein that was not present on study done 02/26/19.  *See table(s) above for measurements and observations.  Diagnosing physician: Monica Martinez MD Electronically signed by Monica Martinez MD on 03/08/2019 at 5:00:30 PM.    Final    Vas [place] Upper Extremity Venous  Duplex  Result Date: 02/26/2019 UPPER VENOUS STUDY  Indications: Swelling, and recent left shoulder fracture  Limitations: Restricted mobility, body habitus and poor ultrasound/tissue interface. Comparison Study: No prior study. Performing Technologist: Maudry Mayhew MHA, RDMS, RVT, RDCS  Examination Guidelines: A complete evaluation includes B-mode imaging, spectral Doppler, color Doppler, and power Doppler as needed of all accessible portions of each vessel. Bilateral testing is considered an integral part of a complete examination. Limited examinations for reoccurring indications may be performed as noted.  Right Findings: +----------+------------+---------+-----------+----------+--------------+ RIGHT     CompressiblePhasicitySpontaneousProperties   Summary     +----------+------------+---------+-----------+----------+--------------+ Subclavian                                          Not visualized +----------+------------+---------+-----------+----------+--------------+  Left Findings: +----------+------------+---------+-----------+----------+--------------+ LEFT      CompressiblePhasicitySpontaneousProperties   Summary     +----------+------------+---------+-----------+----------+--------------+ IJV           Full    pulsatile    Yes                             +----------+------------+---------+-----------+----------+--------------+ Subclavian            pulsatile    Yes                             +----------+------------+---------+-----------+----------+--------------+ Axillary      Full    pulsatile    Yes                             +----------+------------+---------+-----------+----------+--------------+ Brachial      Full                 Yes                             +----------+------------+---------+-----------+----------+--------------+ Radial        Full                                                 +----------+------------+---------+-----------+----------+--------------+ Ulnar                                               Not  visualized +----------+------------+---------+-----------+----------+--------------+ Cephalic      Full                                                 +----------+------------+---------+-----------+----------+--------------+ Basilic                                             Not visualized +----------+------------+---------+-----------+----------+--------------+  Summary:  Left: No evidence of deep vein thrombosis in the upper extremity. No evidence of superficial vein thrombosis in the upper extremity. This was a limited  study.  *See table(s) above for measurements and observations.  Diagnosing physician: Servando Snare MD Electronically signed by Servando Snare MD on 02/26/2019 at 9:56:17 PM.    Final     Microbiology: Recent Results (from the past 240 hour(s))  Urine culture     Status: Abnormal   Collection Time: 03/06/19 10:16 PM   Specimen: Urine, Random  Result Value Ref Range Status   Specimen Description   Final    URINE, RANDOM Performed at [facility], [place] [address] [place], [place] [address]    Special Requests   Final    NONE Performed at [facility], [place] [address], [place], [place] [address]    Culture (A)  Final    <10,000 COLONIES/mL INSIGNIFICANT GROWTH Performed at [place] [address], [place], [place] [address]    Report Status 03/08/2019 FINAL  Final  SARS CORONAVIRUS 2 (TAT 6-12 HRS) Nasal Swab     Status: None   Collection Time: 03/06/19 11:01 PM   Specimen: Nasal Swab  Result Value Ref Range Status   SARS Coronavirus 2 NEGATIVE NEGATIVE Final    Comment: (NOTE) SARS-CoV-2 target nucleic acids are NOT DETECTED. The SARS-CoV-2 RNA is generally detectable in upper and lower respiratory specimens during the acute phase of infection. Negative results do not preclude SARS-CoV-2 infection, do not rule out co-infections with other pathogens, and should not be used as the sole basis for treatment or  other patient management decisions. Negative results must be combined with clinical observations, patient history, and epidemiological information. The expected result is Negative. Fact Sheet for Patients: SugarRoll[employer]be Fact Sheet for Healthcare Providers: https://www.woods-mathews.com/ This test is not yet approved or cleared by the [place] FDA and  has been authorized for detection and/or diagnosis of SARS-CoV-2 by FDA under an Emergency Use Authorization (EUA). This EUA will remain  in effect (meaning this test can be used) for the duration of the COVID-19 declaration under Section 56 4(b)(1) of the Act, 21 U.S.C. section 360bbb-3(b)(1), unless the authorization is terminated or revoked sooner. Performed at [facility], [place] [address], [place], [place] [address]      Labs: CBC: Recent Labs  Lab 03/09/19 [phone] 03/10/19 0436 03/11/19 0343 03/12/19 0345 03/13/19 0441  WBC 4.7 5.4 5.3 7.1 6.8  HGB 8.0* 7.8* 7.5* 8.1* 7.7*  HCT 24.5* 24.4* 23.5* 25.8* 24.7*  MCV 91.1 91.7 93.3 93.1 93.2  PLT 203 204 190 234 601   Basic Metabolic Panel: Recent Labs  Lab 03/09/19 0933 03/09/19 1847 03/10/19 1150 03/12/19 1107 03/13/19 0441  NA 136 137 139 136 135  K 3.3* 3.5 3.7 3.4* 3.7  CL 101 103 104 102 104  CO2 24 24 24 24 22   GLUCOSE 118* 137* 110* 147* 121*  BUN 70* 58* 49* 34* 30*  CREATININE 1.98* 1.81* 1.73* 1.67* 1.48*  CALCIUM 8.8* 8.9 8.8* 8.5* 8.7*  MG 2.4  --  2.3 2.1 2.1   Liver Function Tests: No results for input(s): AST, ALT, ALKPHOS, BILITOT, PROT, ALBUMIN in the last 168 hours. No results for input(s): LIPASE, AMYLASE in the last 168 hours. Recent Labs  Lab 03/09/19 2000  AMMONIA 38*   Cardiac Enzymes: Recent Labs  Lab 03/10/19 1150  CKTOTAL 108   BNP (last 3 results) Recent Labs    02/22/19 2120 03/06/19 2022 03/10/19 1150  BNP 501.4* 334.3* 363.7*   CBG: Recent Labs  Lab 03/12/19 1155  03/12/19 1635 03/12/19 2144  03/13/19 0747 03/13/19 1159  GLUCAP 116* 116* 143* 116* 145*   Time spent: 35 minutes  Signed:  Berle Mull  [facility] 03/13/2019

## 2019-03-20 ENCOUNTER — Encounter: Payer: Self-pay | Admitting: Internal Medicine

## 2019-03-20 ENCOUNTER — Inpatient Hospital Stay: Payer: Medicare Other

## 2019-03-20 ENCOUNTER — Telehealth: Payer: Self-pay | Admitting: Internal Medicine

## 2019-03-20 ENCOUNTER — Other Ambulatory Visit: Payer: Self-pay

## 2019-03-20 ENCOUNTER — Inpatient Hospital Stay: Payer: Medicare Other | Attending: Internal Medicine | Admitting: Internal Medicine

## 2019-03-20 VITALS — BP 144/80 | HR 98 | Temp 98.3°F | Resp 20 | Ht 68.0 in | Wt 243.3 lb

## 2019-03-20 DIAGNOSIS — C3490 Malignant neoplasm of unspecified part of unspecified bronchus or lung: Secondary | ICD-10-CM

## 2019-03-20 DIAGNOSIS — I700 Atherosclerosis of aorta: Secondary | ICD-10-CM | POA: Insufficient documentation

## 2019-03-20 DIAGNOSIS — I130 Hypertensive heart and chronic kidney disease with heart failure and stage 1 through stage 4 chronic kidney disease, or unspecified chronic kidney disease: Secondary | ICD-10-CM | POA: Diagnosis not present

## 2019-03-20 DIAGNOSIS — E785 Hyperlipidemia, unspecified: Secondary | ICD-10-CM | POA: Insufficient documentation

## 2019-03-20 DIAGNOSIS — R0602 Shortness of breath: Secondary | ICD-10-CM

## 2019-03-20 DIAGNOSIS — Z794 Long term (current) use of insulin: Secondary | ICD-10-CM | POA: Insufficient documentation

## 2019-03-20 DIAGNOSIS — I5032 Chronic diastolic (congestive) heart failure: Secondary | ICD-10-CM | POA: Diagnosis not present

## 2019-03-20 DIAGNOSIS — N189 Chronic kidney disease, unspecified: Secondary | ICD-10-CM | POA: Diagnosis not present

## 2019-03-20 DIAGNOSIS — Z95828 Presence of other vascular implants and grafts: Secondary | ICD-10-CM

## 2019-03-20 DIAGNOSIS — R21 Rash and other nonspecific skin eruption: Secondary | ICD-10-CM | POA: Insufficient documentation

## 2019-03-20 DIAGNOSIS — R634 Abnormal weight loss: Secondary | ICD-10-CM | POA: Diagnosis not present

## 2019-03-20 DIAGNOSIS — C3411 Malignant neoplasm of upper lobe, right bronchus or lung: Secondary | ICD-10-CM | POA: Insufficient documentation

## 2019-03-20 DIAGNOSIS — R5382 Chronic fatigue, unspecified: Secondary | ICD-10-CM

## 2019-03-20 DIAGNOSIS — Z5112 Encounter for antineoplastic immunotherapy: Secondary | ICD-10-CM

## 2019-03-20 DIAGNOSIS — E119 Type 2 diabetes mellitus without complications: Secondary | ICD-10-CM | POA: Insufficient documentation

## 2019-03-20 DIAGNOSIS — Z79899 Other long term (current) drug therapy: Secondary | ICD-10-CM | POA: Insufficient documentation

## 2019-03-20 LAB — CBC WITH DIFFERENTIAL (CANCER CENTER ONLY)
Abs Immature Granulocytes: 0.02 10*3/uL (ref 0.00–0.07)
Basophils Absolute: 0.1 10*3/uL (ref 0.0–0.1)
Basophils Relative: 1 %
Eosinophils Absolute: 0.7 10*3/uL — ABNORMAL HIGH (ref 0.0–0.5)
Eosinophils Relative: 14 %
HCT: 26.8 % — ABNORMAL LOW (ref 39.0–52.0)
Hemoglobin: 8.4 g/dL — ABNORMAL LOW (ref 13.0–17.0)
Immature Granulocytes: 0 %
Lymphocytes Relative: 9 %
Lymphs Abs: 0.5 10*3/uL — ABNORMAL LOW (ref 0.7–4.0)
MCH: 28.5 pg (ref 26.0–34.0)
MCHC: 31.3 g/dL (ref 30.0–36.0)
MCV: 90.8 fL (ref 80.0–100.0)
Monocytes Absolute: 0.5 10*3/uL (ref 0.1–1.0)
Monocytes Relative: 9 %
Neutro Abs: 3.6 10*3/uL (ref 1.7–7.7)
Neutrophils Relative %: 67 %
Platelet Count: 303 10*3/uL (ref 150–400)
RBC: 2.95 MIL/uL — ABNORMAL LOW (ref 4.22–5.81)
RDW: 17.2 % — ABNORMAL HIGH (ref 11.5–15.5)
WBC Count: 5.3 10*3/uL (ref 4.0–10.5)
nRBC: 0 % (ref 0.0–0.2)

## 2019-03-20 LAB — CMP (CANCER CENTER ONLY)
ALT: 14 U/L (ref 0–44)
AST: 21 U/L (ref 15–41)
Albumin: 3.2 g/dL — ABNORMAL LOW (ref 3.5–5.0)
Alkaline Phosphatase: 61 U/L (ref 38–126)
Anion gap: 11 (ref 5–15)
BUN: 39 mg/dL — ABNORMAL HIGH (ref 8–23)
CO2: 24 mmol/L (ref 22–32)
Calcium: 9.7 mg/dL (ref 8.9–10.3)
Chloride: 101 mmol/L (ref 98–111)
Creatinine: 1.99 mg/dL — ABNORMAL HIGH (ref 0.61–1.24)
GFR, Est AFR Am: 38 mL/min — ABNORMAL LOW (ref >60)
GFR, Estimated: 33 mL/min — ABNORMAL LOW (ref >60)
Glucose, Bld: 253 mg/dL — ABNORMAL HIGH (ref 70–99)
Potassium: 3.8 mmol/L (ref 3.5–5.1)
Sodium: 136 mmol/L (ref 135–145)
Total Bilirubin: 0.8 mg/dL (ref 0.3–1.2)
Total Protein: 7.5 g/dL (ref 6.5–8.1)

## 2019-03-20 LAB — TSH: TSH: 2.68 u[IU]/mL (ref 0.320–4.118)

## 2019-03-20 MED ORDER — SODIUM CHLORIDE 0.9% FLUSH
10.0000 mL | INTRAVENOUS | Status: DC | PRN
  Administered 2019-03-20: 10 mL
  Filled 2019-03-20: qty 10

## 2019-03-20 MED ORDER — METHYLPREDNISOLONE 4 MG PO TBPK: ORAL_TABLET | ORAL | 0 refills | Status: DC

## 2019-03-20 NOTE — Telephone Encounter (Signed)
Scheduled appt per 9/8 los - pt aware - per pt no print out needed - my chart active.

## 2019-03-20 NOTE — Progress Notes (Signed)
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 First dose 02/27/2018.Status post 5 cycles.  CURRENT THERAPY:Consolidationimmunotherapy with Imfinzi 10 mg/kg every 2 weeks.First dose given on 05/17/2018.Status post 18 cycles.  INTERVAL HISTORY: Darrell [name] 73 y.o. male returns to the clinic today for follow-up visit.  His wife was available by phone during the visit.  The patient is feeling much better today.  He was recently admitted to [facility] with significant shortness of breath and he was diagnosed with diastolic congestive heart failure.  He was diuresed aggressively and lost around 30 pounds of fluids.  The patient has no current chest pain but has shortness of breath at baseline increased with exertion and he is currently on home oxygen.  He also complained of his skin rash and itching that preventing him from sleep.  He denied having any current nausea, vomiting, diarrhea or constipation.  He denied having any fever or chills.  The patient is here today for evaluation and recommendation regarding his condition.  MEDICAL HISTORY: Past Medical History:  Diagnosis Date   Aortic atherosclerosis ([place]) 07/18/2018   Cancer ([place])    Cellulitis of right lower extremity    Chronic kidney disease    Diabetes mellitus without complication ([place])    Dyslipidemia    Hypertension    Thoracic aortic aneurysm without rupture ([place]) 07/18/2018    ALLERGIES:  is allergic to percocet [oxycodone-acetaminophen] and lisinopril.  MEDICATIONS:  Current  Outpatient Medications  Medication Sig Dispense Refill   albuterol (PROVENTIL HFA;VENTOLIN HFA) 108 (90 Base) MCG/ACT inhaler Inhale 2 puffs into the lungs every 4 (four) hours as needed for wheezing or shortness of breath. 1 Inhaler 5   calamine lotion Apply topically 3 (three) times daily. 120 mL 0   fenofibrate 160 MG tablet Take 160 mg by mouth daily.      furosemide (LASIX) 40 MG tablet Take 1 tablet (40 mg total) by mouth 2 (two) times daily. Take additional 40 mg tablet/day if weight is up more than 3 lbs in a day or 5 lbs in 2 day. 60 tablet 0   Insulin Detemir (LEVEMIR FLEXTOUCH) 100 UNIT/ML Pen Inject 10 Units into the skin daily.      Insulin Pen Needle (FIFTY50 PEN NEEDLES) 31G X 8 MM MISC USE AS DIRECTED     lidocaine-prilocaine (EMLA) cream Apply 1 application topically as needed. (Patient taking differently: Apply 1 application topically as needed (port). ) 30 g 0   Multiple Vitamin (MULTI-VITAMINS) TABS Take 1 tablet by mouth daily.      nystatin (MYCOSTATIN/NYSTOP) powder Apply topically 3 (three) times daily. 15 g 0   Omega-3 1000 MG CAPS Take 1,200 mg by mouth 2 (two) times daily.      pantoprazole (PROTONIX) 40 MG tablet Take 1 tablet (40 mg total) by mouth 2 (two) times daily before a meal for 14 days. 28 tablet 0   polyethylene glycol (MIRALAX / GLYCOLAX) 17 g packet Take 17 g by mouth daily. 14 each 0   Potassium Chloride ER 20 MEQ TBCR Take 20 mEq by mouth daily for 7 days.  7 tablet 0   pravastatin (PRAVACHOL) 40 MG tablet Take 40 mg by mouth daily.      prochlorperazine (COMPAZINE) 10 MG tablet TAKE 1 TABLET BY MOUTH EVERY 6 HOURS AS NEEDED FOR NAUSEA OR VOMITING (Patient taking differently: Take 10 mg by mouth every 6 (six) hours as needed for nausea or vomiting. ) 385 tablet 0   senna-docusate (SENOKOT-S) 8.6-50 MG tablet Take 2 tablets by mouth 2 (two) times daily.     No current facility-administered medications for this visit.     Facility-Administered Medications Ordered in Other Visits  Medication Dose Route Frequency Provider Last Rate Last Dose   sodium chloride flush (NS) 0.9 % injection 10 mL  10 mL Intracatheter PRN Curt Bears, MD   10 mL at 06/28/18 1501    SURGICAL HISTORY:  Past Surgical History:  Procedure Laterality Date   IR GUIDED DRAIN W CATHETER PLACEMENT  11/15/2018   IR GUIDED DRAIN W CATHETER PLACEMENT  01/31/2019   IR INSTILL VIA CHEST TUBE AGENT FOR FIBRINOLYSIS INI DAY  01/23/2019   IR REMOVAL OF PLURAL CATH W/CUFF  01/31/2019   IR THORACENTESIS ASP PLEURAL SPACE W/IMG GUIDE  09/05/2018   IR THORACENTESIS ASP PLEURAL SPACE W/IMG GUIDE  10/11/2018   IR THORACENTESIS ASP PLEURAL SPACE W/IMG GUIDE  10/30/2018   PORTACATH PLACEMENT Left 02/21/2018   Procedure: INSERTION PORT-A-CATH;  Surgeon: Grace Isaac, MD;  Location: [facility];  Service: Thoracic;  Laterality: Left;    REVIEW OF SYSTEMS:  Constitutional: positive for fatigue and weight loss Eyes: negative Ears, nose, mouth, throat, and face: negative Respiratory: positive for dyspnea on exertion Cardiovascular: negative Gastrointestinal: negative Genitourinary:negative Integument/breast: positive for pruritus and rash Hematologic/lymphatic: negative Musculoskeletal:positive for muscle weakness Neurological: negative Behavioral/Psych: negative Endocrine: negative Allergic/Immunologic: negative   PHYSICAL EXAMINATION: General appearance: alert, cooperative, fatigued and no distress Head: Normocephalic, without obvious abnormality, atraumatic Neck: no adenopathy, no JVD, supple, symmetrical, trachea midline and thyroid not enlarged, symmetric, no tenderness/mass/nodules Lymph nodes: Cervical, supraclavicular, and axillary nodes normal. Resp: clear to auscultation bilaterally Back: symmetric, no curvature. ROM normal. No CVA tenderness. Cardio: regular rate and rhythm, S1, S2 normal, no murmur, click, rub or gallop GI:  soft, non-tender; bowel sounds normal; no masses,  no organomegaly Extremities: edema 1+ edema bilaterally. Neurologic: Alert and oriented X 3, normal strength and tone. Normal symmetric reflexes. Normal coordination and gait  ECOG PERFORMANCE STATUS: 1 - Symptomatic but completely ambulatory  Blood pressure (!) 144/80, pulse 98, temperature 98.3 F (36.8 C), temperature source Oral, resp. rate 20, height 5\' 8"  (1.727 m), weight 243 lb 4.8 oz (110.4 kg), SpO2 100 %.  LABORATORY DATA: Lab Results  Component Value Date   WBC 6.8 03/13/2019   HGB 7.7 (L) 03/13/2019   HCT 24.7 (L) 03/13/2019   MCV 93.2 03/13/2019   PLT 223 03/13/2019      Chemistry      Component Value Date/Time   NA 135 03/13/2019 0441   K 3.7 03/13/2019 0441   CL 104 03/13/2019 0441   CO2 22 03/13/2019 0441   BUN 30 (H) 03/13/2019 0441   CREATININE 1.48 (H) 03/13/2019 0441   CREATININE 1.94 (H) 02/19/2019 0845      Component Value Date/Time   CALCIUM 8.7 (L) 03/13/2019 0441   ALKPHOS 34 (L) 03/08/2019 0458   AST 37 03/08/2019 0458   AST 23 02/19/2019 0845   ALT 26 03/08/2019 0458   ALT 12 02/19/2019 0845   BILITOT 2.0 (H) 03/08/2019  0458   BILITOT 0.7 02/19/2019 0845       RADIOGRAPHIC STUDIES: Dg Chest 2 View  Result Date: 03/08/2019 CLINICAL DATA:  Shortness of breath EXAM: CHEST - 2 VIEW COMPARISON:  03/06/2019 FINDINGS: Small right pleural effusion. Right perihilar airspace disease unchanged from the prior exam. Left lung is clear. No pneumothorax. Stable heart size. Left-sided Port-A-Cath in satisfactory position. IMPRESSION: 1. Small right pleural effusion. Persistent right perihilar airspace disease unchanged compared with 03/06/2019. Electronically Signed   By: Kathreen Devoid   On: 03/08/2019 12:19   Dg Chest 2 View  Result Date: 02/22/2019 CLINICAL DATA:  Shortness of breath. EXAM: CHEST - 2 VIEW COMPARISON:  January 31, 2019 FINDINGS: Injectable port in stable position. Normal cardiac silhouette.  Stable volume loss in the right hemithorax with loculated pleural effusion. Stable soft tissue thickening in the right perihilar region. The left lung is clear. Osseous structures are without acute abnormality. Soft tissues are grossly normal. IMPRESSION: 1. Stable volume loss in the right hemithorax with loculated pleural effusion. 2. Stable soft tissue thickening in the right perihilar region. Electronically Signed   By: Fidela Salisbury M.D.   On: 02/22/2019 15:58   Dg Chest Port 1 View  Result Date: 03/10/2019 CLINICAL DATA:  Shortness of breath. EXAM: PORTABLE CHEST 1 VIEW COMPARISON:  Chest x-ray dated March 08, 2019. FINDINGS: Unchanged left chest wall port catheter. Stable cardiomediastinal silhouette. Right perihilar airspace disease has improved. Unchanged small right pleural effusion. The left lung is clear. No pneumothorax. No acute osseous abnormality. IMPRESSION: 1. Slightly improved right perihilar airspace disease. Unchanged small right pleural effusion. Electronically Signed   By: Titus Dubin M.D.   On: 03/10/2019 14:03   Dg Chest Portable 1 View  Addendum Date: 03/06/2019   ADDENDUM REPORT: 03/06/2019 20:49 ADDENDUM: These results were called by telephone at the time of interpretation on 03/06/2019 at 8:49 pm to Dr. Coral Ceo , who verbally acknowledged these results. Electronically Signed   By: Lovena Le M.D.   On: 03/06/2019 20:49   Result Date: 03/06/2019 CLINICAL DATA:  Shortness of breath, history of lung cancer, shortness of breath for 3 days upper and lower extremity edema. EXAM: PORTABLE CHEST 1 VIEW COMPARISON:  Radiograph 01/31/2019 FINDINGS: Left subclavian approach Port-A-Cath tip terminates at the right atrium. Right chest tube is positioned in the right lung base. There is persistent consolidative opacity in the right perihilar region with a moderate right pleural effusion which layers posteriorly resulting in a gradient density of the right hemithorax. The  left lung remains largely clear. Cardiomediastinal contours are unchanged from prior. There is a new, comminuted fracture of the proximal left humerus which appears to extend through the surgical and anatomic necks involving the greater tuberosity. Inferior positioning of the humeral head relative to the left glenoid may reflect a left joint effusion. IMPRESSION: Redemonstrated consolidative opacity in the right mid lung with moderate right pleural effusion despite a right pleural drain in place. Comminuted fracture of the proximal left humerus. Inferior subluxation of the humeral head may reflect joint effusion. Electronically Signed: By: Lovena Le M.D. On: 03/06/2019 20:44   Dg Chest Port 1 View  Result Date: 02/26/2019 CLINICAL DATA:  Shortness of breath EXAM: PORTABLE CHEST 1 VIEW COMPARISON:  01/16/2019 chest CT FINDINGS: Decreased pleuroparenchymal opacity at the right base. There is right perihilar radiation fibrosis. Cardiomegaly. Unremarkable left-sided porta catheter. IMPRESSION: 1. Decreased pleural fluid on the right. 2. Right perihilar fibrosis and chronic volume loss. Electronically Signed  By: Monte Fantasia M.D.   On: 02/26/2019 04:27   Dg Shoulder Left  Result Date: 02/25/2019 CLINICAL DATA:  Pain status post fall EXAM: LEFT SHOULDER - 2+ VIEW COMPARISON:  January 30, 2019 FINDINGS: There is a partially visualized left subclavian Port-A-Cath. Again noted is an impacted fracture of the left humeral head and neck. The fracture appears slightly more impacted on today's exam. There is mild surrounding soft tissue swelling. There is some evidence of mild interval callus formation. There is no glenohumeral dislocation. Osteopenia is noted. IMPRESSION: 1. Again noted is an impacted fracture of the proximal left humerus. There has been slight interval increase in impaction since the prior study. There is minimal surrounding callus formation. 2. No glenohumeral dislocation. Electronically Signed    By: Constance Holster M.D.   On: 02/25/2019 16:50   Vas [place] Upper Extremity Venous Duplex  Result Date: 03/12/2019 UPPER VENOUS STUDY  Indications: Pain, Edema, and Broken shoulder. Possible brachial vein DVT found 03/08/19 Comparison Study: Prior study from 03/08/19 is available for comparison Performing Technologist: Sharion Dove RVS  Examination Guidelines: A complete evaluation includes B-mode imaging, spectral Doppler, color Doppler, and power Doppler as needed of all accessible portions of each vessel. Bilateral testing is considered an integral part of a complete examination. Limited examinations for reoccurring indications may be performed as noted.  Right Findings: +----------+------------+---------+-----------+----------+-------+  RIGHT      Compressible Phasicity Spontaneous Properties Summary  +----------+------------+---------+-----------+----------+-------+  Subclavian                 Yes        Yes                         +----------+------------+---------+-----------+----------+-------+  Left Findings: +----------+------------+---------+-----------+----------+---------------+  LEFT       Compressible Phasicity Spontaneous Properties     Summary      +----------+------------+---------+-----------+----------+---------------+  IJV            Full        Yes        Yes                                 +----------+------------+---------+-----------+----------+---------------+  Subclavian                 Yes        Yes                                 +----------+------------+---------+-----------+----------+---------------+  Axillary                   Yes        Yes                                 +----------+------------+---------+-----------+----------+---------------+  Brachial       Full        Yes        Yes                                 +----------+------------+---------+-----------+----------+---------------+  Radial  patent by color   +----------+------------+---------+-----------+----------+---------------+  Ulnar                                                    patent by color  +----------+------------+---------+-----------+----------+---------------+  Cephalic       Full                                                       +----------+------------+---------+-----------+----------+---------------+  Basilic        Full                                                       +----------+------------+---------+-----------+----------+---------------+  Summary:  Right: No evidence of thrombosis in the subclavian.  Left: No evidence of deep vein thrombosis in the upper extremity. No evidence of superficial vein thrombosis in the upper extremity. Possible DVT found in one of the brachial veins 03/08/19, appears resolved.  *See table(s) above for measurements and observations.  Diagnosing physician: Monica Martinez MD Electronically signed by Monica Martinez MD on 03/12/2019 at 6:04:40 PM.    Final    Vas [place] Upper Extremity Venous Duplex  Result Date: 03/08/2019 UPPER VENOUS STUDY  Indications: Pain, and Left arm is broken Limitations: Broken arm, unable to move arm. Comparison Study: Prior study from 02/26/19 is available for comparison Performing Technologist: Sharion Dove RVS  Examination Guidelines: A complete evaluation includes B-mode imaging, spectral Doppler, color Doppler, and power Doppler as needed of all accessible portions of each vessel. Bilateral testing is considered an integral part of a complete examination. Limited examinations for reoccurring indications may be performed as noted.  Right Findings: +----------+------------+---------+-----------+----------+-------+  RIGHT      Compressible Phasicity Spontaneous Properties Summary  +----------+------------+---------+-----------+----------+-------+  Subclavian                 Yes        Yes                          +----------+------------+---------+-----------+----------+-------+  Left Findings: +----------+------------+---------+-----------+----------+--------------+  LEFT       Compressible Phasicity Spontaneous Properties    Summary      +----------+------------+---------+-----------+----------+--------------+  IJV                        Yes        Yes                                +----------+------------+---------+-----------+----------+--------------+  Subclavian                 Yes        Yes                                +----------+------------+---------+-----------+----------+--------------+  Axillary  Yes        Yes                                +----------+------------+---------+-----------+----------+--------------+  Brachial       None        No         No                                 +----------+------------+---------+-----------+----------+--------------+  Radial                                                   Not visualized  +----------+------------+---------+-----------+----------+--------------+  Ulnar                                                    Not visualized  +----------+------------+---------+-----------+----------+--------------+  Cephalic       Full                                                      +----------+------------+---------+-----------+----------+--------------+  Basilic        Full                                                      +----------+------------+---------+-----------+----------+--------------+  Summary:  Right: No evidence of thrombosis in the subclavian.  Left: However, unable to visualize the radial, ulnar veins. Findings consistent with acute deep vein thrombosis involving the left brachial veins. There is now DVT in the left brachial vein that was not present on study done 02/26/19.  *See table(s) above for measurements and observations.  Diagnosing physician: Monica Martinez MD Electronically signed by Monica Martinez MD on 03/08/2019 at  5:00:30 PM.    Final    Vas [place] Upper Extremity Venous Duplex  Result Date: 02/26/2019 UPPER VENOUS STUDY  Indications: Swelling, and recent left shoulder fracture Limitations: Restricted mobility, body habitus and poor ultrasound/tissue interface. Comparison Study: No prior study. Performing Technologist: Maudry Mayhew MHA, RDMS, RVT, RDCS  Examination Guidelines: A complete evaluation includes B-mode imaging, spectral Doppler, color Doppler, and power Doppler as needed of all accessible portions of each vessel. Bilateral testing is considered an integral part of a complete examination. Limited examinations for reoccurring indications may be performed as noted.  Right Findings: +----------+------------+---------+-----------+----------+--------------+  RIGHT      Compressible Phasicity Spontaneous Properties    Summary      +----------+------------+---------+-----------+----------+--------------+  Subclavian                                               Not visualized  +----------+------------+---------+-----------+----------+--------------+  Left Findings: +----------+------------+---------+-----------+----------+--------------+  LEFT  Compressible Phasicity Spontaneous Properties    Summary      +----------+------------+---------+-----------+----------+--------------+  IJV            Full     pulsatile     Yes                                +----------+------------+---------+-----------+----------+--------------+  Subclavian              pulsatile     Yes                                +----------+------------+---------+-----------+----------+--------------+  Axillary       Full     pulsatile     Yes                                +----------+------------+---------+-----------+----------+--------------+  Brachial       Full                   Yes                                +----------+------------+---------+-----------+----------+--------------+  Radial         Full                                                       +----------+------------+---------+-----------+----------+--------------+  Ulnar                                                    Not visualized  +----------+------------+---------+-----------+----------+--------------+  Cephalic       Full                                                      +----------+------------+---------+-----------+----------+--------------+  Basilic                                                  Not visualized  +----------+------------+---------+-----------+----------+--------------+  Summary:  Left: No evidence of deep vein thrombosis in the upper extremity. No evidence of superficial vein thrombosis in the upper extremity. This was a limited study.  *See table(s) above for measurements and observations.  Diagnosing physician: Servando Snare MD Electronically signed by Servando Snare MD on 02/26/2019 at 9:56:17 PM.    Final     ASSESSMENT AND PLAN: This is a very pleasant 73 years old white male with a stage IIb/IIIa non-small cell lung cancer, squamous cell carcinoma diagnosed in July 2019. The patient is currently undergoing a course of concurrent chemoradiation with weekly carboplatin and paclitaxel status post 5 cycles.  He had partial response after the initial induction treatment. The patient was started on treatment  with consolidation Imfinzi status post 18 cycles.  The patient has been tolerating his treatment well except for the skin rash and itching.  He reaches the point that he could not sleep from the itching. I had a lengthy discussion with the patient and his wife today about his condition.  He completed 18 cycles of this treatment.  The patient has a lot of other comorbidities especially the recent diagnosis with congestive heart failure. I discussed with the patient discontinuing his current treatment with Imfinzi at this point.  He and his wife agreed to the current plan.  We will arrange for him to have repeat CT scan of the chest in 2 weeks  for restaging of his disease and if no evidence for disease progression he will continue on observation. For the congestive heart failure he has aggressive diuresis and he will continue his current treatment by cardiology. For the left shoulder fracture, he is managed conservatively by orthopedic surgery. For the skin rash and itching, I started the patient on Medrol Dosepak. He was advised to call immediately if he has any concerning symptoms in the interval. The patient voices understanding of current disease status and treatment options and is in agreement with the current care plan. All questions were answered. The patient knows to call the clinic with any problems, questions or concerns. We can certainly see the patient much sooner if necessary.  Disclaimer: This note was dictated with voice recognition software. Similar sounding words can inadvertently be transcribed and may not be corrected upon review.

## 2019-03-27 ENCOUNTER — Telehealth: Payer: Self-pay | Admitting: Medical Oncology

## 2019-03-27 DIAGNOSIS — T50905A Adverse effect of unspecified drugs, medicaments and biological substances, initial encounter: Secondary | ICD-10-CM

## 2019-03-27 DIAGNOSIS — L298 Other pruritus: Secondary | ICD-10-CM

## 2019-03-27 MED ORDER — HYDROXYZINE HCL 10 MG PO TABS: 10.0000 mg | ORAL_TABLET | Freq: Three times a day (TID) | ORAL | 0 refills | Status: AC | PRN

## 2019-03-27 NOTE — Telephone Encounter (Signed)
Persistent itching, rash and not sleeping, using multiple lotions, completed prednisone yesterday .

## 2019-03-27 NOTE — Telephone Encounter (Signed)
Try Atarax twice daily.

## 2019-03-27 NOTE — Telephone Encounter (Signed)
Pt notified and rx sent to preferred pharmacy.

## 2019-03-28 ENCOUNTER — Telehealth: Payer: Self-pay | Admitting: Internal Medicine

## 2019-03-28 NOTE — Telephone Encounter (Signed)
Returned patient's phone call regarding rescheduling 09/18 and 09/21 appointments, due to the CT scan being moved appointments have labs/port flush has been moved to 09/21 and follow up has been moved to 09/24.

## 2019-03-29 NOTE — Progress Notes (Signed)
CARDIOLOGY OFFICE NOTE  Date:  04/04/2019    [name] Date of Birth: 22-Feb-1946 Medical Record #[id number]  PCP:  [name], MD  Cardiologist:  Marlou Porch ([place])   Chief Complaint  Patient presents with   Follow-up    History of Present Illness: Darrell [name] is a 73 y.o. male who presents today for a post hospital visit. Seen for Dr. Marlou Porch ([place]).  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 [name] from oncology and has been on immunotherapy, recurrent pleural effusion with right-sided Pleurx catheter in place, CKD stage III, DM, HTN, HLD, thoracic aortic aneurysm, and recent left proximal humerus fracture.   Presented last month to the hospital by EMS for shortness of breath. Had had noted less drainage from his Pleurx.  Had been on NSAID for his shoulder pain due to prior fracture.   Found to have upper GI bleeding with presenting HGB of 5.0. He was transfused and started on PPI therapy. GI recommended conservative management.  Cardiology called for PACs and acute diastolic HF - there was concern for atrial fib - this was reviewed by Dr. Marlou Porch - no AF noted. Eliquis was stopped - especially in the setting of profound anemia. Echo with normal EF. Found to also have brachial vein DVT - treated conservatively given acute bleeding as well. Transitioned over to oral diuretic. Did have worsening CKD noted.   The patient does not have symptoms concerning for COVID-19 infection (fever, chills, cough, or new shortness of breath).   Comes in today. Here alone. He is feeling good. Making progress. He is itching - this is his biggest complaint - has had this for almost 2 months. This is presumed to be from his immune therapy - but he has been off this for 5 months per his report. Has been told that it was not felt to be due to his kidney disease. No chest pain. No more bleeding. Breathing is real good. Can walk almost 1029ft with his  cane. Pleurx is not draining. Shoulder is on the mend as well. He has no palpitations. Again, his biggest issue is his itching.   Past Medical History:  Diagnosis Date   Aortic atherosclerosis ([place]) 07/18/2018   Cancer ([place])    Cellulitis of right lower extremity    Chronic kidney disease    Diabetes mellitus without complication ([place])    Dyslipidemia    Hypertension    Thoracic aortic aneurysm without rupture ([place]) 07/18/2018    Past Surgical History:  Procedure Laterality Date   IR GUIDED DRAIN W CATHETER PLACEMENT  11/15/2018   IR GUIDED DRAIN W CATHETER PLACEMENT  01/31/2019   IR INSTILL VIA CHEST TUBE AGENT FOR FIBRINOLYSIS INI DAY  01/23/2019   IR REMOVAL OF PLURAL CATH W/CUFF  01/31/2019   IR THORACENTESIS ASP PLEURAL SPACE W/IMG GUIDE  09/05/2018   IR THORACENTESIS ASP PLEURAL SPACE W/IMG GUIDE  10/11/2018   IR THORACENTESIS ASP PLEURAL SPACE W/IMG GUIDE  10/30/2018   PORTACATH PLACEMENT Left 02/21/2018   Procedure: INSERTION PORT-A-CATH;  Surgeon: Grace Isaac, MD;  Location: [facility] OR;  Service: Thoracic;  Laterality: Left;     Medications: Current Meds  Medication Sig   albuterol (PROVENTIL HFA;VENTOLIN HFA) 108 (90 Base) MCG/ACT inhaler Inhale 2 puffs into the lungs every 4 (four) hours as needed for wheezing or shortness of breath.   fenofibrate 160 MG tablet Take 160 mg by mouth  daily.    furosemide (LASIX) 40 MG tablet Take 1 tablet (40 mg total) by mouth 2 (two) times daily. Take additional 40 mg tablet/day if weight is up more than 3 lbs in a day or 5 lbs in 2 day.   hydrOXYzine (ATARAX/VISTARIL) 10 MG tablet Take 1 tablet (10 mg total) by mouth 3 (three) times daily as needed.   Insulin Detemir (LEVEMIR FLEXTOUCH) 100 UNIT/ML Pen Inject 10 Units into the skin daily.    Insulin Pen Needle (FIFTY50 PEN NEEDLES) 31G X 8 MM MISC USE AS DIRECTED   lidocaine-prilocaine (EMLA) cream Apply 1 application topically as needed.   Multiple Vitamin  (MULTI-VITAMINS) TABS Take 1 tablet by mouth daily.    nystatin (MYCOSTATIN/NYSTOP) powder Apply topically 3 (three) times daily.   Omega-3 1000 MG CAPS Take 1,200 mg by mouth 2 (two) times daily.    polyethylene glycol (MIRALAX / GLYCOLAX) 17 g packet Take 17 g by mouth daily.   pravastatin (PRAVACHOL) 40 MG tablet Take 40 mg by mouth daily.    senna-docusate (SENOKOT-S) 8.6-50 MG tablet Take 2 tablets by mouth 2 (two) times daily.     Allergies: Allergies  Allergen Reactions   Percocet [Oxycodone-Acetaminophen] Itching   Lisinopril Cough         Social History: The patient  reports that he quit smoking about 22 years ago. His smoking use included cigarettes. He has a 70.00 pack-year smoking history. He has never used smokeless tobacco. He reports current alcohol use. He reports that he does not use drugs.   Family History: The patient's family history includes Breast cancer in his mother; CAD in his father.   Review of Systems: Please see the history of present illness.   All other systems are reviewed and negative.   Physical Exam: VS:  BP 130/80    Pulse (!) 102    Ht 5\' 8"  (1.727 m)    Wt 240 lb 12.8 oz (109.2 kg)    SpO2 99%    BMI 36.61 kg/m  .  BMI Body mass index is 36.61 kg/m.  Wt Readings from Last 3 Encounters:  04/04/19 240 lb 12.8 oz (109.2 kg)  03/20/19 243 lb 4.8 oz (110.4 kg)  03/12/19 255 lb 4.8 oz (115.8 kg)    General: Pleasant. Looks chronically ill but alert and in no acute distress. His weight is down considerably.   HEENT: Normal.  Neck: Supple, no JVD, carotid bruits, or masses noted.  Cardiac: Regular rate and rhythm. Occasional ectopic noted.  No edema.  Respiratory:  Lungs are clear to auscultation bilaterally with normal work of breathing.  GI: Soft and nontender.  MS: No deformity or atrophy. Gait and ROM intact. Using a cane.  Skin: Warm and dry. Color is sallow.  Neuro:  Strength and sensation are intact and no gross focal deficits  noted.  Psych: Alert, appropriate and with normal affect.   LABORATORY DATA:  EKG:  EKG is ordered today. This demonstrates sinus tach - PACs noted - HR is 102 today.  Lab Results  Component Value Date   WBC 5.4 04/02/2019   HGB 9.5 (L) 04/02/2019   HCT 29.4 (L) 04/02/2019   PLT 294 04/02/2019   GLUCOSE 183 (H) 04/02/2019   CHOL 184 06/06/2006   TRIG 197 (H) 06/06/2006   HDL 37.9 (L) 06/06/2006   LDLCALC 107 (H) 06/06/2006   ALT 15 04/02/2019   AST 23 04/02/2019   NA 134 (L) 04/02/2019   K 3.9 04/02/2019  CL 98 04/02/2019   CREATININE 2.43 (H) 04/02/2019   BUN 63 (H) 04/02/2019   CO2 24 04/02/2019   TSH 2.680 03/20/2019   PSA 1.61 06/06/2006   INR 1.3 (H) 03/06/2019   HGBA1C 5.8 (H) 02/23/2019   MICROALBUR 2.7 (H) 06/06/2006     BNP (last 3 results) Recent Labs    02/22/19 2120 03/06/19 2022 03/10/19 1150  BNP 501.4* 334.3* 363.7*    ProBNP (last 3 results) No results for input(s): PROBNP in the last 8760 hours.   Other Studies Reviewed Today:  Echocardiogram 02/23/2019: 1. The left ventricle has normal systolic function with an ejection fraction of 60-65%. The cavity size was normal. Left ventricular diastolic function could not be evaluated secondary to atrial fibrillation. 2. Very poor acoustical windows limited evaluation for regional wall motion which could not be assessed. 3. The right ventricle has normal systolic function. The cavity was normal. There is no increase in right ventricular wall thickness. 4. Left atrial size was mildly dilated. 5. The mitral valve is grossly normal. 6. The aortic valve is grossly normal. 7. The aorta is normal unless otherwise noted. 8. The inferior vena cava was dilated in size with <50% respiratory variability.   Assessment & Plan    1. Recent admission for shortness of breath - in the setting of profound anemia (probably from NSAID use) - required multiple transfusions - with associated diastolic HF -  most likely multifactorial. His EF is normal. Has worsening CKD - stopping Lasix today. See #2.   2. Worsening CKD - this may be the culprit for his itching - his weight is down considerably and he has no symptoms of heart failure - stopping Lasix today - looks pretty dried out to me by his labs. He is asked to get repeat lab with oncology in about a week.   3. Frequent PACs - he was not felt to have had atrial fib - he is not symptomatic with the PACs. If he were to have AF - this would be challenging to place him on anticoagulation - I would think the risk of recurrent bleeding/past bleed with conservative GI approach planned is too great for him.   4. HTN - BP is fine today.   5. Prior left proximal humerus fracture - he says this is improving.   6. Squamous cell lung cancer - per oncology - sees them tomorrow.   7. DM - per PCP  8. COVID-19 Education: The signs and symptoms of COVID-19 were discussed with the patient and how to seek care for testing (follow up with PCP or arrange E-visit).  The importance of social distancing, staying at home, hand hygiene and wearing a mask when out in public were discussed today.  Current medicines are reviewed with the patient today.  The patient does not have concerns regarding medicines other than what has been noted above.  The following changes have been made:  See above.  Labs/ tests ordered today include:   No orders of the defined types were placed in this encounter.    Disposition:   FU with [place] as needed. Happy to see back as needed.   Patient is agreeable to this plan and will call if any problems develop in the interim.   SignedTruitt Merle, NP  04/04/2019 3:14 PM  [facility] [address] [place], [place]  [address] Phone: [phone] Fax: [phone]

## 2019-03-30 ENCOUNTER — Telehealth: Payer: Self-pay | Admitting: *Deleted

## 2019-03-30 ENCOUNTER — Other Ambulatory Visit: Payer: Medicare Other

## 2019-03-30 ENCOUNTER — Inpatient Hospital Stay: Payer: Medicare Other

## 2019-03-30 NOTE — Telephone Encounter (Signed)
Received call from [facility] requesting verbal order to continue services. Verbal order given

## 2019-04-02 ENCOUNTER — Other Ambulatory Visit: Payer: Medicare Other

## 2019-04-02 ENCOUNTER — Other Ambulatory Visit: Payer: Self-pay

## 2019-04-02 ENCOUNTER — Ambulatory Visit: Payer: Medicare Other

## 2019-04-02 ENCOUNTER — Other Ambulatory Visit: Payer: Self-pay | Admitting: *Deleted

## 2019-04-02 ENCOUNTER — Encounter (HOSPITAL_COMMUNITY): Payer: Self-pay

## 2019-04-02 ENCOUNTER — Inpatient Hospital Stay: Payer: Medicare Other

## 2019-04-02 ENCOUNTER — Ambulatory Visit (HOSPITAL_COMMUNITY)
Admission: RE | Admit: 2019-04-02 | Discharge: 2019-04-02 | Disposition: A | Discharge status: Home / Self Care | Payer: Medicare Other | Source: Ambulatory Visit | Attending: Internal Medicine | Admitting: Internal Medicine

## 2019-04-02 ENCOUNTER — Ambulatory Visit: Payer: Medicare Other | Admitting: Internal Medicine

## 2019-04-02 DIAGNOSIS — C3490 Malignant neoplasm of unspecified part of unspecified bronchus or lung: Secondary | ICD-10-CM | POA: Diagnosis present

## 2019-04-02 DIAGNOSIS — T50905A Adverse effect of unspecified drugs, medicaments and biological substances, initial encounter: Secondary | ICD-10-CM

## 2019-04-02 DIAGNOSIS — C3411 Malignant neoplasm of upper lobe, right bronchus or lung: Secondary | ICD-10-CM

## 2019-04-02 DIAGNOSIS — Z95828 Presence of other vascular implants and grafts: Secondary | ICD-10-CM

## 2019-04-02 DIAGNOSIS — L298 Other pruritus: Secondary | ICD-10-CM

## 2019-04-02 LAB — CMP (CANCER CENTER ONLY)
ALT: 15 U/L (ref 0–44)
AST: 23 U/L (ref 15–41)
Albumin: 3.6 g/dL (ref 3.5–5.0)
Alkaline Phosphatase: 56 U/L (ref 38–126)
Anion gap: 12 (ref 5–15)
BUN: 63 mg/dL — ABNORMAL HIGH (ref 8–23)
CO2: 24 mmol/L (ref 22–32)
Calcium: 9.9 mg/dL (ref 8.9–10.3)
Chloride: 98 mmol/L (ref 98–111)
Creatinine: 2.43 mg/dL — ABNORMAL HIGH (ref 0.61–1.24)
GFR, Est AFR Am: 30 mL/min — ABNORMAL LOW (ref >60)
GFR, Estimated: 26 mL/min — ABNORMAL LOW (ref >60)
Glucose, Bld: 183 mg/dL — ABNORMAL HIGH (ref 70–99)
Potassium: 3.9 mmol/L (ref 3.5–5.1)
Sodium: 134 mmol/L — ABNORMAL LOW (ref 135–145)
Total Bilirubin: 0.7 mg/dL (ref 0.3–1.2)
Total Protein: 7.7 g/dL (ref 6.5–8.1)

## 2019-04-02 LAB — CBC WITH DIFFERENTIAL (CANCER CENTER ONLY)
Abs Immature Granulocytes: 0.02 10*3/uL (ref 0.00–0.07)
Basophils Absolute: 0.1 10*3/uL (ref 0.0–0.1)
Basophils Relative: 1 %
Eosinophils Absolute: 0.3 10*3/uL (ref 0.0–0.5)
Eosinophils Relative: 6 %
HCT: 29.4 % — ABNORMAL LOW (ref 39.0–52.0)
Hemoglobin: 9.5 g/dL — ABNORMAL LOW (ref 13.0–17.0)
Immature Granulocytes: 0 %
Lymphocytes Relative: 9 %
Lymphs Abs: 0.5 10*3/uL — ABNORMAL LOW (ref 0.7–4.0)
MCH: 28.7 pg (ref 26.0–34.0)
MCHC: 32.3 g/dL (ref 30.0–36.0)
MCV: 88.8 fL (ref 80.0–100.0)
Monocytes Absolute: 0.5 10*3/uL (ref 0.1–1.0)
Monocytes Relative: 10 %
Neutro Abs: 4 10*3/uL (ref 1.7–7.7)
Neutrophils Relative %: 74 %
Platelet Count: 294 10*3/uL (ref 150–400)
RBC: 3.31 MIL/uL — ABNORMAL LOW (ref 4.22–5.81)
RDW: 16.6 % — ABNORMAL HIGH (ref 11.5–15.5)
WBC Count: 5.4 10*3/uL (ref 4.0–10.5)
nRBC: 0 % (ref 0.0–0.2)

## 2019-04-02 MED ORDER — SODIUM CHLORIDE 0.9% FLUSH
10.0000 mL | INTRAVENOUS | Status: DC | PRN
  Administered 2019-04-02: 10 mL
  Filled 2019-04-02: qty 10

## 2019-04-02 MED ORDER — HEPARIN SOD (PORK) LOCK FLUSH 100 UNIT/ML IV SOLN
500.0000 [IU] | Freq: Once | INTRAVENOUS | Status: AC
  Administered 2019-04-02: 500 [IU] via INTRAVENOUS

## 2019-04-02 MED ORDER — HEPARIN SOD (PORK) LOCK FLUSH 100 UNIT/ML IV SOLN
INTRAVENOUS | Status: AC
  Filled 2019-04-02: qty 5

## 2019-04-02 NOTE — Patient Instructions (Signed)

## 2019-04-02 NOTE — Progress Notes (Signed)
Referral to Dermatology. Placed call to office for an appt. Office closed at [facility]. [email or web address] for call back regarding referral.

## 2019-04-04 ENCOUNTER — Other Ambulatory Visit: Payer: Self-pay

## 2019-04-04 ENCOUNTER — Ambulatory Visit (INDEPENDENT_AMBULATORY_CARE_PROVIDER_SITE_OTHER): Payer: Medicare Other | Admitting: Nurse Practitioner

## 2019-04-04 ENCOUNTER — Encounter: Payer: Self-pay | Admitting: Nurse Practitioner

## 2019-04-04 VITALS — BP 130/80 | HR 102 | Ht 68.0 in | Wt 240.8 lb

## 2019-04-04 DIAGNOSIS — I5032 Chronic diastolic (congestive) heart failure: Secondary | ICD-10-CM | POA: Diagnosis not present

## 2019-04-04 DIAGNOSIS — I491 Atrial premature depolarization: Secondary | ICD-10-CM

## 2019-04-04 DIAGNOSIS — R0600 Dyspnea, unspecified: Secondary | ICD-10-CM | POA: Diagnosis not present

## 2019-04-04 NOTE — Patient Instructions (Addendum)
After Visit Summary:  We will be checking the following labs today - NONE   Medication Instructions:    Continue with your current medicines.   He was instructed to stop his Lasix. He will have follow up lab with oncology.    If you need a refill on your cardiac medications before your next appointment, please call your pharmacy.     Testing/Procedures To Be Arranged:  N/A  Follow-Up:   See [place] back as needed.     At [facility], you and your health needs are our priority.  As part of our continuing mission to provide you with exceptional heart care, we have created designated Provider Care Teams.  These Care Teams include your primary Cardiologist (physician) and Advanced Practice Providers (APPs -  Physician Assistants and Nurse Practitioners) who all work together to provide you with the care you need, when you need it.  Special Instructions:  . Stay safe, stay home, wash your hands for at least 20 seconds and wear a mask when out in public.  . It was good to talk with you today.  . You were not felt to have atrial fibrillation during this last hospitalization. Your heart is a good strong. We will see you back as needed. If you were to have this type of irregular heart rhythm, it would be challenging because you would need blood thinner and given your recent bleeding and profound anemia, this would be challenging.    Call the [place] office at [phone] if you have any questions, problems or concerns.

## 2019-04-05 ENCOUNTER — Other Ambulatory Visit: Payer: Self-pay

## 2019-04-05 ENCOUNTER — Telehealth: Payer: Self-pay | Admitting: Internal Medicine

## 2019-04-05 ENCOUNTER — Encounter: Payer: Self-pay | Admitting: Internal Medicine

## 2019-04-05 ENCOUNTER — Inpatient Hospital Stay (HOSPITAL_BASED_OUTPATIENT_CLINIC_OR_DEPARTMENT_OTHER): Payer: Medicare Other | Admitting: Internal Medicine

## 2019-04-05 VITALS — BP 122/71 | HR 98 | Temp 98.3°F | Resp 17 | Ht 68.0 in | Wt 239.4 lb

## 2019-04-05 DIAGNOSIS — Z5112 Encounter for antineoplastic immunotherapy: Secondary | ICD-10-CM | POA: Diagnosis not present

## 2019-04-05 DIAGNOSIS — C3490 Malignant neoplasm of unspecified part of unspecified bronchus or lung: Secondary | ICD-10-CM

## 2019-04-05 DIAGNOSIS — C3411 Malignant neoplasm of upper lobe, right bronchus or lung: Secondary | ICD-10-CM

## 2019-04-05 NOTE — Telephone Encounter (Signed)
Scheduled per 09/24 los, patient received after visit summary and calender.

## 2019-04-05 NOTE — Progress Notes (Signed)
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 First dose 02/27/2018.Status post 5 cycles. 2) Consolidationimmunotherapy with Imfinzi 10 mg/kg every 2 weeks.First dose given on 05/17/2018.Status post 18 cycles discontinued secondary to intolerance.   CURRENT THERAPY:Observation.  INTERVAL HISTORY: Darrell [name] 73 y.o. male returns to the clinic today for follow-up visit.  His wife was available by phone during the visit.  The patient is feeling much better today except for the baseline shortness of breath.  The itching has significantly improved after the patient discontinued his treatment with Lasix.  He denied having any current chest pain, cough or hemoptysis.  He denied having any fever or chills.  He has no nausea, vomiting, diarrhea or constipation.  He has no headache or visual changes.  He had repeat CT scan of the chest performed recently and he is here for evaluation and discussion of his scan results.  MEDICAL HISTORY: Past Medical History:  Diagnosis Date   Aortic atherosclerosis ([place]) 07/18/2018   Cancer ([place])    Cellulitis of right lower extremity    Chronic kidney disease    Diabetes mellitus without complication ([place])    Dyslipidemia    Hypertension    Thoracic aortic aneurysm without rupture ([place]) 07/18/2018    ALLERGIES:  is allergic to percocet [oxycodone-acetaminophen] and lisinopril.  MEDICATIONS:  Current Outpatient Medications  Medication Sig Dispense Refill   albuterol (PROVENTIL HFA;VENTOLIN HFA) 108 (90 Base) MCG/ACT  inhaler Inhale 2 puffs into the lungs every 4 (four) hours as needed for wheezing or shortness of breath. 1 Inhaler 5   fenofibrate 160 MG tablet Take 160 mg by mouth daily.      furosemide (LASIX) 40 MG tablet Take 1 tablet (40 mg total) by mouth 2 (two) times daily. Take additional 40 mg tablet/day if weight is up more than 3 lbs in a day or 5 lbs in 2 day. 60 tablet 0   hydrOXYzine (ATARAX/VISTARIL) 10 MG tablet Take 1 tablet (10 mg total) by mouth 3 (three) times daily as needed. 30 tablet 0   Insulin Detemir (LEVEMIR FLEXTOUCH) 100 UNIT/ML Pen Inject 10 Units into the skin daily.      Insulin Pen Needle (FIFTY50 PEN NEEDLES) 31G X 8 MM MISC USE AS DIRECTED     lidocaine-prilocaine (EMLA) cream Apply 1 application topically as needed. 30 g 0   Multiple Vitamin (MULTI-VITAMINS) TABS Take 1 tablet by mouth daily.      nystatin (MYCOSTATIN/NYSTOP) powder Apply topically 3 (three) times daily. 15 g 0   Omega-3 1000 MG CAPS Take 1,200 mg by mouth 2 (two) times daily.      pantoprazole (PROTONIX) 40 MG tablet Take 1 tablet (40 mg total) by mouth 2 (two) times daily before a meal for 14 days. 28 tablet 0   polyethylene glycol (MIRALAX / GLYCOLAX) 17 g packet Take 17 g by mouth daily. 14 each 0   Potassium Chloride ER 20 MEQ TBCR Take 20 mEq by mouth daily for 7 days. 7 tablet 0   pravastatin (PRAVACHOL) 40 MG tablet Take 40 mg by mouth daily.  senna-docusate (SENOKOT-S) 8.6-50 MG tablet Take 2 tablets by mouth 2 (two) times daily.     No current facility-administered medications for this visit.    Facility-Administered Medications Ordered in Other Visits  Medication Dose Route Frequency Provider Last Rate Last Dose   sodium chloride flush (NS) 0.9 % injection 10 mL  10 mL Intracatheter PRN Curt Bears, MD   10 mL at 06/28/18 1501    SURGICAL HISTORY:  Past Surgical History:  Procedure Laterality Date   IR GUIDED DRAIN W CATHETER PLACEMENT  11/15/2018   IR GUIDED DRAIN  W CATHETER PLACEMENT  01/31/2019   IR INSTILL VIA CHEST TUBE AGENT FOR FIBRINOLYSIS INI DAY  01/23/2019   IR REMOVAL OF PLURAL CATH W/CUFF  01/31/2019   IR THORACENTESIS ASP PLEURAL SPACE W/IMG GUIDE  09/05/2018   IR THORACENTESIS ASP PLEURAL SPACE W/IMG GUIDE  10/11/2018   IR THORACENTESIS ASP PLEURAL SPACE W/IMG GUIDE  10/30/2018   PORTACATH PLACEMENT Left 02/21/2018   Procedure: INSERTION PORT-A-CATH;  Surgeon: Grace Isaac, MD;  Location: [facility];  Service: Thoracic;  Laterality: Left;    REVIEW OF SYSTEMS:  Constitutional: positive for fatigue Eyes: negative Ears, nose, mouth, throat, and face: negative Respiratory: positive for dyspnea on exertion Cardiovascular: negative Gastrointestinal: negative Genitourinary:negative Integument/breast: positive for pruritus Hematologic/lymphatic: negative Musculoskeletal:positive for muscle weakness Neurological: negative Behavioral/Psych: negative Endocrine: negative Allergic/Immunologic: negative   PHYSICAL EXAMINATION: General appearance: alert, cooperative, fatigued and no distress Head: Normocephalic, without obvious abnormality, atraumatic Neck: no adenopathy, no JVD, supple, symmetrical, trachea midline and thyroid not enlarged, symmetric, no tenderness/mass/nodules Lymph nodes: Cervical, supraclavicular, and axillary nodes normal. Resp: clear to auscultation bilaterally Back: symmetric, no curvature. ROM normal. No CVA tenderness. Cardio: regular rate and rhythm, S1, S2 normal, no murmur, click, rub or gallop GI: soft, non-tender; bowel sounds normal; no masses,  no organomegaly Extremities: extremities normal, atraumatic, no cyanosis or edema Neurologic: Alert and oriented X 3, normal strength and tone. Normal symmetric reflexes. Normal coordination and gait  ECOG PERFORMANCE STATUS: 1 - Symptomatic but completely ambulatory  Blood pressure 122/71, pulse 98, temperature 98.3 F (36.8 C), temperature source Temporal, resp.  rate 17, height 5\' 8"  (1.727 m), weight 239 lb 6.4 oz (108.6 kg), SpO2 99 %.  LABORATORY DATA: Lab Results  Component Value Date   WBC 5.4 04/02/2019   HGB 9.5 (L) 04/02/2019   HCT 29.4 (L) 04/02/2019   MCV 88.8 04/02/2019   PLT 294 04/02/2019      Chemistry      Component Value Date/Time   NA 134 (L) 04/02/2019 1200   K 3.9 04/02/2019 1200   CL 98 04/02/2019 1200   CO2 24 04/02/2019 1200   BUN 63 (H) 04/02/2019 1200   CREATININE 2.43 (H) 04/02/2019 1200      Component Value Date/Time   CALCIUM 9.9 04/02/2019 1200   ALKPHOS 56 04/02/2019 1200   AST 23 04/02/2019 1200   ALT 15 04/02/2019 1200   BILITOT 0.7 04/02/2019 1200       RADIOGRAPHIC STUDIES: Dg Chest 2 View  Result Date: 03/08/2019 CLINICAL DATA:  Shortness of breath EXAM: CHEST - 2 VIEW COMPARISON:  03/06/2019 FINDINGS: Small right pleural effusion. Right perihilar airspace disease unchanged from the prior exam. Left lung is clear. No pneumothorax. Stable heart size. Left-sided Port-A-Cath in satisfactory position. IMPRESSION: 1. Small right pleural effusion. Persistent right perihilar airspace disease unchanged compared with 03/06/2019. Electronically Signed   By: Kathreen Devoid   On: 03/08/2019 12:19  Ct Chest Wo Contrast  Result Date: 04/02/2019 CLINICAL DATA:  Lung carcinoma. Non-small cell lung cancer. RIGHT lung disease. EXAM: CT CHEST WITHOUT CONTRAST TECHNIQUE: Multidetector CT imaging of the chest was performed following the standard protocol without IV contrast. COMPARISON:  January 16, 2019 FINDINGS: Cardiovascular: Ascending thoracic aorta measures 4.2 cm unchanged. No pericardial effusion. Coronary artery calcification and aortic atherosclerotic calcification. Mediastinum/Nodes: No axillary supraclavicular adenopathy. Port in the anterior chest wall with tip in distal SVC. No mediastinal adenopathy. Perihilar thickening on the RIGHT similar prior. Lungs/Pleura: Peribronchial consolidation in the RIGHT upper lobe  surrounding mild bronchiectasis is not changed from comparison exam most consistent radiation change. Perihilar thickening extends into the RIGHT lower lobe also unchanged. Previous seen ground-glass nodularity in the RIGHT lower lobe has resolved. Persistent moderate size RIGHT pleural effusion with a catheter drain in place. Similar volume to prior. LEFT lung is clear. Upper Abdomen: Limited view of the liver, kidneys, pancreas are unremarkable. Normal adrenal glands. Musculoskeletal: No aggressive osseous lesion IMPRESSION: 1. Perihilar consolidation with air bronchograms in the RIGHT upper lobe and RIGHT lower lobe not changed comparison exam and consistent radiation change. 2. No evidence of lung cancer recurrence. 3. Persistent chronic RIGHT pleural effusion with drainage catheter in place. 4. No evidence of mediastinal lymphadenopathy. 5. Stable aneurysmal dilatation of the ascending thoracic aorta. Recommend annual imaging followup by CTA or MRA. This recommendation follows 2010 ACCF/AHA/AATS/ACR/ASA/SCA/SCAI/SIR/STS/SVM Guidelines for the Diagnosis and Management of Patients with Thoracic Aortic Disease. Circulation. 2010; 121: T557-D220. Aortic aneurysm NOS (ICD10-I71.9) Electronically Signed   By: Suzy Bouchard M.D.   On: 04/02/2019 13:56   Dg Chest Port 1 View  Result Date: 03/10/2019 CLINICAL DATA:  Shortness of breath. EXAM: PORTABLE CHEST 1 VIEW COMPARISON:  Chest x-ray dated March 08, 2019. FINDINGS: Unchanged left chest wall port catheter. Stable cardiomediastinal silhouette. Right perihilar airspace disease has improved. Unchanged small right pleural effusion. The left lung is clear. No pneumothorax. No acute osseous abnormality. IMPRESSION: 1. Slightly improved right perihilar airspace disease. Unchanged small right pleural effusion. Electronically Signed   By: Titus Dubin M.D.   On: 03/10/2019 14:03   Dg Chest Portable 1 View  Addendum Date: 03/06/2019   ADDENDUM REPORT:  03/06/2019 20:49 ADDENDUM: These results were called by telephone at the time of interpretation on 03/06/2019 at 8:49 pm to Dr. Coral Ceo , who verbally acknowledged these results. Electronically Signed   By: Lovena Le M.D.   On: 03/06/2019 20:49   Result Date: 03/06/2019 CLINICAL DATA:  Shortness of breath, history of lung cancer, shortness of breath for 3 days upper and lower extremity edema. EXAM: PORTABLE CHEST 1 VIEW COMPARISON:  Radiograph 01/31/2019 FINDINGS: Left subclavian approach Port-A-Cath tip terminates at the right atrium. Right chest tube is positioned in the right lung base. There is persistent consolidative opacity in the right perihilar region with a moderate right pleural effusion which layers posteriorly resulting in a gradient density of the right hemithorax. The left lung remains largely clear. Cardiomediastinal contours are unchanged from prior. There is a new, comminuted fracture of the proximal left humerus which appears to extend through the surgical and anatomic necks involving the greater tuberosity. Inferior positioning of the humeral head relative to the left glenoid may reflect a left joint effusion. IMPRESSION: Redemonstrated consolidative opacity in the right mid lung with moderate right pleural effusion despite a right pleural drain in place. Comminuted fracture of the proximal left humerus. Inferior subluxation of the humeral head may reflect joint  effusion. Electronically Signed: By: Lovena Le M.D. On: 03/06/2019 20:44   Vas [place] Upper Extremity Venous Duplex  Result Date: 03/12/2019 UPPER VENOUS STUDY  Indications: Pain, Edema, and Broken shoulder. Possible brachial vein DVT found 03/08/19 Comparison Study: Prior study from 03/08/19 is available for comparison Performing Technologist: Sharion Dove RVS  Examination Guidelines: A complete evaluation includes B-mode imaging, spectral Doppler, color Doppler, and power Doppler as needed of all accessible portions of  each vessel. Bilateral testing is considered an integral part of a complete examination. Limited examinations for reoccurring indications may be performed as noted.  Right Findings: +----------+------------+---------+-----------+----------+-------+  RIGHT      Compressible Phasicity Spontaneous Properties Summary  +----------+------------+---------+-----------+----------+-------+  Subclavian                 Yes        Yes                         +----------+------------+---------+-----------+----------+-------+  Left Findings: +----------+------------+---------+-----------+----------+---------------+  LEFT       Compressible Phasicity Spontaneous Properties     Summary      +----------+------------+---------+-----------+----------+---------------+  IJV            Full        Yes        Yes                                 +----------+------------+---------+-----------+----------+---------------+  Subclavian                 Yes        Yes                                 +----------+------------+---------+-----------+----------+---------------+  Axillary                   Yes        Yes                                 +----------+------------+---------+-----------+----------+---------------+  Brachial       Full        Yes        Yes                                 +----------+------------+---------+-----------+----------+---------------+  Radial                                                   patent by color  +----------+------------+---------+-----------+----------+---------------+  Ulnar                                                    patent by color  +----------+------------+---------+-----------+----------+---------------+  Cephalic       Full                                                       +----------+------------+---------+-----------+----------+---------------+  Basilic        Full                                                        +----------+------------+---------+-----------+----------+---------------+  Summary:  Right: No evidence of thrombosis in the subclavian.  Left: No evidence of deep vein thrombosis in the upper extremity. No evidence of superficial vein thrombosis in the upper extremity. Possible DVT found in one of the brachial veins 03/08/19, appears resolved.  *See table(s) above for measurements and observations.  Diagnosing physician: Monica Martinez MD Electronically signed by Monica Martinez MD on 03/12/2019 at 6:04:40 PM.    Final    Vas [place] Upper Extremity Venous Duplex  Result Date: 03/08/2019 UPPER VENOUS STUDY  Indications: Pain, and Left arm is broken Limitations: Broken arm, unable to move arm. Comparison Study: Prior study from 02/26/19 is available for comparison Performing Technologist: Sharion Dove RVS  Examination Guidelines: A complete evaluation includes B-mode imaging, spectral Doppler, color Doppler, and power Doppler as needed of all accessible portions of each vessel. Bilateral testing is considered an integral part of a complete examination. Limited examinations for reoccurring indications may be performed as noted.  Right Findings: +----------+------------+---------+-----------+----------+-------+  RIGHT      Compressible Phasicity Spontaneous Properties Summary  +----------+------------+---------+-----------+----------+-------+  Subclavian                 Yes        Yes                         +----------+------------+---------+-----------+----------+-------+  Left Findings: +----------+------------+---------+-----------+----------+--------------+  LEFT       Compressible Phasicity Spontaneous Properties    Summary      +----------+------------+---------+-----------+----------+--------------+  IJV                        Yes        Yes                                +----------+------------+---------+-----------+----------+--------------+  Subclavian                 Yes        Yes                                 +----------+------------+---------+-----------+----------+--------------+  Axillary                   Yes        Yes                                +----------+------------+---------+-----------+----------+--------------+  Brachial       None        No         No                                 +----------+------------+---------+-----------+----------+--------------+  Radial  Not visualized  +----------+------------+---------+-----------+----------+--------------+  Ulnar                                                    Not visualized  +----------+------------+---------+-----------+----------+--------------+  Cephalic       Full                                                      +----------+------------+---------+-----------+----------+--------------+  Basilic        Full                                                      +----------+------------+---------+-----------+----------+--------------+  Summary:  Right: No evidence of thrombosis in the subclavian.  Left: However, unable to visualize the radial, ulnar veins. Findings consistent with acute deep vein thrombosis involving the left brachial veins. There is now DVT in the left brachial vein that was not present on study done 02/26/19.  *See table(s) above for measurements and observations.  Diagnosing physician: Monica Martinez MD Electronically signed by Monica Martinez MD on 03/08/2019 at 5:00:30 PM.    Final     ASSESSMENT AND PLAN: This is a very pleasant 73 years old white male with a stage IIb/IIIa non-small cell lung cancer, squamous cell carcinoma diagnosed in July 2019. The patient is currently undergoing a course of concurrent chemoradiation with weekly carboplatin and paclitaxel status post 5 cycles.  He had partial response after the initial induction treatment. The patient was started on treatment with consolidation Imfinzi status post 18 cycles.  The patient has been  tolerating his treatment well except for the skin rash and itching.  He reaches the point that he could not sleep from the itching. I had a lengthy discussion with the patient and his wife today about his condition.  He completed 18 cycles of this treatment.  The patient has a lot of other comorbidities especially the recent diagnosis with congestive heart failure. I discussed with the patient discontinuing his current treatment with Imfinzi at this point.  He is currently on observation. He had repeat CT scan of the chest performed recently.  I personally and independently reviewed the scans and discussed the results with the patient and his wife. His scan showed no concerning findings for disease progression. I recommended for the patient to continue on observation with repeat CT scan of the chest in 3 months. For the congestive heart failure he has aggressive diuresis and he will continue his current treatment by cardiology. For the left shoulder fracture, he is managed conservatively by orthopedic surgery. For the skin rash and itching, this is significantly improved after the patient discontinued his treatment with Lasix. For the renal insufficiency, this is likely secondary to aggressive diuresis and the patient is now currently off Lasix and hopefully his numbers will improve.  He was advised to increase his hydration. The patient was advised to call immediately if he has any concerning symptoms in the interval. The patient voices understanding of current disease status and treatment options and is in agreement with  the current care plan. All questions were answered. The patient knows to call the clinic with any problems, questions or concerns. We can certainly see the patient much sooner if necessary.  Disclaimer: This note was dictated with voice recognition software. Similar sounding words can inadvertently be transcribed and may not be corrected upon review.

## 2019-04-06 ENCOUNTER — Telehealth: Payer: Self-pay | Admitting: *Deleted

## 2019-04-06 NOTE — Telephone Encounter (Signed)
Received call from pt's wife. She states her husband was seen by Dr. [name] yesterday. Both he and pt's wife and cardiologist felt that his ongoing itching problem may be related to to his CKD. The lasix was stopped with just 1 day improvement of itching. She states the itching has returned.  A referral to dermatology was done on 04/02/19. Wife cannot remember the name of the office. Advised wife that likely it was [facility] and provided that phone # to her. [phone]. Wife voiced appreciation.

## 2019-04-13 ENCOUNTER — Telehealth: Payer: Self-pay

## 2019-04-13 NOTE — Telephone Encounter (Signed)
   [facility] Pre-operative Risk Assessment    Request for surgical clearance:  1. What type of surgery is being performed? Endoscopy    2. When is this surgery scheduled? TBD   3. What type of clearance is required (medical clearance vs. Pharmacy clearance to hold med vs. Both)? Medical   4. Are there any medications that need to be held prior to surgery and how long?    5. Practice name and name of physician performing surgery? [facility] Dr. Fuller [place]   6. What is your office phone number [phone]    7.   What is your office fax number         [phone]  8.   Anesthesia type (None, local, MAC, general) ? Propofol

## 2019-04-16 NOTE — Telephone Encounter (Signed)
   Primary Cardiologist: Candee Furbish, MD  Chart reviewed as part of pre-operative protocol coverage. Patient was contacted 04/16/2019 in reference to pre-operative risk assessment for pending surgery as outlined below.  Darrell [name] was last seen on 04/04/2019 by Truitt Merle, NP-C.  At that time, Darrell [name] was doing well from a cardiac perspective. He was walking up to 102ft with a cane with no anginal symptoms or SOB. His physical activity was mostly limited by orthopedic issues. He had no further bleeding and no palpitations noted.  Therefore, based on ACC/AHA guidelines, the patient would be at acceptable risk for the planned procedure without further cardiovascular testing.   I will route this recommendation to the requesting party via Epic fax function and remove from pre-op pool.  Please call with questions.  Kathyrn Drown, NP 04/16/2019, 8:32 AM

## 2019-04-18 ENCOUNTER — Telehealth: Payer: Self-pay | Admitting: *Deleted

## 2019-04-18 DIAGNOSIS — C3411 Malignant neoplasm of upper lobe, right bronchus or lung: Secondary | ICD-10-CM

## 2019-04-18 NOTE — Telephone Encounter (Signed)
Pt wife called with pt request to have pleurx removed. Reviewed with MD, order entered.

## 2019-05-02 ENCOUNTER — Ambulatory Visit (INDEPENDENT_AMBULATORY_CARE_PROVIDER_SITE_OTHER): Payer: Medicare Other | Admitting: Surgery

## 2019-05-02 ENCOUNTER — Encounter: Payer: Self-pay | Admitting: Surgery

## 2019-05-02 ENCOUNTER — Ambulatory Visit: Payer: Medicare Other

## 2019-05-02 ENCOUNTER — Other Ambulatory Visit: Payer: Self-pay

## 2019-05-02 DIAGNOSIS — G8929 Other chronic pain: Secondary | ICD-10-CM

## 2019-05-02 DIAGNOSIS — S42202A Unspecified fracture of upper end of left humerus, initial encounter for closed fracture: Secondary | ICD-10-CM | POA: Diagnosis not present

## 2019-05-02 DIAGNOSIS — M25512 Pain in left shoulder: Secondary | ICD-10-CM | POA: Diagnosis not present

## 2019-05-02 DIAGNOSIS — M7502 Adhesive capsulitis of left shoulder: Secondary | ICD-10-CM

## 2019-05-02 NOTE — Progress Notes (Signed)
Office Visit Note   Patient: Darrell [name]           Date of Birth: 02/28/1946           MRN: [id number] Visit Date: 05/02/2019              Requested by: [name], MD [place],  [place] [id number] PCP: [name], MD   Assessment & Plan: Visit Diagnoses:  1. Closed fracture of proximal end of left humerus, unspecified fracture morphology, initial encounter   2. Adhesive capsulitis of left shoulder   3. Chronic left shoulder pain     Plan: I reviewed left shoulder x-ray with Dr. Alphonzo Severance here in the clinic today.  We will have patient start home health PT for left shoulder range of motion only.  No resistance.  Follow-up in 5 weeks for recheck with Dr. Lorin Mercy.  Follow-Up Instructions: Return in about 5 weeks (around 06/06/2019) for With Dr. Lorin Mercy.   Orders:  Orders Placed This Encounter  Procedures  . XR Shoulder Left   No orders of the defined types were placed in this encounter.     Procedures: No procedures performed   Clinical Data: No additional findings.   Subjective: Chief Complaint  Patient presents with  . Left Shoulder - Follow-up    HPI 73 year old white male with history of left shoulder proximal humerus fracture returns.  Patient last seen by Dr. Lorin Mercy for this July 2020 but did not return as scheduled due to hospital admission for lung cancer.  States that he continues to have stiffness, decreased range of motion and pain in the left shoulder.  He did do a short course of PT and is wanting to do more therapy. Review of Systems No current cardiac GI issues.  Objective: Vital Signs: There were no vitals taken for this visit.  Physical Exam HENT:     Head: Normocephalic and atraumatic.  Eyes:     Extraocular Movements: Extraocular movements intact.     Pupils: Pupils are equal, round, and reactive to light.  Musculoskeletal:     Comments: Exam left shoulder passive range of motion flexion/abduction to about 80  degrees with marked discomfort.  Neurological:     General: No focal deficit present.     Mental Status: He is oriented to person, place, and time.  Psychiatric:        Mood and Affect: Mood normal.        Behavior: Behavior normal.     Ortho Exam  Specialty Comments:  No specialty comments available.  Imaging: No results found.   PMFS History: Patient Active Problem List   Diagnosis Date Noted  . Anemia 03/07/2019  . Acute upper GI bleed 03/07/2019  . Pain and swelling of right upper extremity   . Acute diastolic CHF (congestive heart failure) ([place]) 02/22/2019  . Closed fracture of left proximal humerus 02/02/2019  . Hypokalemia 09/18/2018  . Dyspnea 08/29/2018  . COPD (chronic obstructive pulmonary disease) ([place]) 08/03/2018  . Hemoptysis 07/18/2018  . CKD (chronic kidney disease) stage 3, GFR 30-59 ml/min 07/18/2018  . Lobar pneumonia ([place]) 07/18/2018  . Thoracic aortic aneurysm without rupture ([place]) 07/18/2018  . Aortic atherosclerosis ([place]) 07/18/2018  . Encounter for antineoplastic immunotherapy 05/17/2018  . Port-A-Cath in place 03/27/2018  . Encounter for antineoplastic chemotherapy 02/16/2018  . 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 Goals of care, counseling/discussion 02/07/2018  . ANGIOKERATOMA, BLEEDING 10/06/2006  . DM2 (diabetes  mellitus, type 2) ([place]) 10/06/2006  . HYPERLIPIDEMIA 10/06/2006  . HYPERTENSION, BENIGN 10/06/2006  . LATERAL MENISCUS TEAR, RIGHT 10/06/2006  . CHOLECYSTECTOMY, HX OF 10/06/2006   Past Medical History:  Diagnosis Date  . Aortic atherosclerosis ([place]) 07/18/2018  . Cancer ([place])   . Cellulitis of right lower extremity   . Chronic kidney disease   . Diabetes mellitus without complication ([place])   . Dyslipidemia   . Hypertension   . Thoracic aortic aneurysm without rupture ([place]) 07/18/2018    Family History  Problem Relation Age of Onset  . Breast cancer Mother   . CAD Father     Past Surgical History:  Procedure  Laterality Date  . IR GUIDED DRAIN W CATHETER PLACEMENT  11/15/2018  . IR GUIDED DRAIN W CATHETER PLACEMENT  01/31/2019  . IR INSTILL VIA CHEST TUBE AGENT FOR FIBRINOLYSIS INI DAY  01/23/2019  . IR REMOVAL OF PLURAL CATH W/CUFF  01/31/2019  . IR THORACENTESIS ASP PLEURAL SPACE W/IMG GUIDE  09/05/2018  . IR THORACENTESIS ASP PLEURAL SPACE W/IMG GUIDE  10/11/2018  . IR THORACENTESIS ASP PLEURAL SPACE W/IMG GUIDE  10/30/2018  . PORTACATH PLACEMENT Left 02/21/2018   Procedure: INSERTION PORT-A-CATH;  Surgeon: Grace Isaac, MD;  [facility];  Service: Thoracic;  Laterality: Left;   Social History   Occupational History  . Not on file  Tobacco Use  . Smoking status: Former Smoker    Packs/day: 2.00    Years: 35.00    Pack years: 70.00    Types: Cigarettes    Quit date: 07/12/1996    Years since quitting: 22.8  . Smokeless tobacco: Never Used  Substance and Sexual Activity  . Alcohol use: Yes    Comment: occasionally  . Drug use: Never  . Sexual activity: Not Currently

## 2019-05-03 ENCOUNTER — Telehealth: Payer: Self-pay | Admitting: Orthopaedic Surgery

## 2019-05-03 NOTE — Telephone Encounter (Signed)
Sonia Side with [facility] called in requesting to have the list of medications for the pt be faxed to [phone].   Any questions cell: [phone]

## 2019-05-06 ENCOUNTER — Other Ambulatory Visit: Payer: Self-pay | Admitting: Radiology

## 2019-05-08 ENCOUNTER — Ambulatory Visit (HOSPITAL_COMMUNITY)
Admission: RE | Admit: 2019-05-08 | Discharge: 2019-05-08 | Disposition: A | Discharge status: Home / Self Care | Payer: Medicare Other | Source: Ambulatory Visit | Attending: Internal Medicine | Admitting: Internal Medicine

## 2019-05-08 ENCOUNTER — Other Ambulatory Visit: Payer: Self-pay | Admitting: Internal Medicine

## 2019-05-08 ENCOUNTER — Other Ambulatory Visit: Payer: Self-pay

## 2019-05-08 ENCOUNTER — Encounter (HOSPITAL_COMMUNITY): Payer: Self-pay

## 2019-05-08 DIAGNOSIS — C3411 Malignant neoplasm of upper lobe, right bronchus or lung: Secondary | ICD-10-CM | POA: Diagnosis not present

## 2019-05-08 DIAGNOSIS — Z79899 Other long term (current) drug therapy: Secondary | ICD-10-CM | POA: Insufficient documentation

## 2019-05-08 DIAGNOSIS — I712 Thoracic aortic aneurysm, without rupture: Secondary | ICD-10-CM | POA: Diagnosis not present

## 2019-05-08 DIAGNOSIS — Z885 Allergy status to narcotic agent status: Secondary | ICD-10-CM | POA: Diagnosis not present

## 2019-05-08 DIAGNOSIS — Z794 Long term (current) use of insulin: Secondary | ICD-10-CM | POA: Diagnosis not present

## 2019-05-08 DIAGNOSIS — Z4682 Encounter for fitting and adjustment of non-vascular catheter: Secondary | ICD-10-CM | POA: Insufficient documentation

## 2019-05-08 DIAGNOSIS — E785 Hyperlipidemia, unspecified: Secondary | ICD-10-CM | POA: Diagnosis not present

## 2019-05-08 DIAGNOSIS — E1122 Type 2 diabetes mellitus with diabetic chronic kidney disease: Secondary | ICD-10-CM | POA: Insufficient documentation

## 2019-05-08 DIAGNOSIS — I129 Hypertensive chronic kidney disease with stage 1 through stage 4 chronic kidney disease, or unspecified chronic kidney disease: Secondary | ICD-10-CM | POA: Diagnosis not present

## 2019-05-08 DIAGNOSIS — Z888 Allergy status to other drugs, medicaments and biological substances status: Secondary | ICD-10-CM | POA: Diagnosis not present

## 2019-05-08 DIAGNOSIS — N189 Chronic kidney disease, unspecified: Secondary | ICD-10-CM | POA: Diagnosis not present

## 2019-05-08 HISTORY — PX: IR REMOVAL OF PLURAL CATH W/CUFF: IMG5346

## 2019-05-08 LAB — CBC WITH DIFFERENTIAL/PLATELET
Abs Immature Granulocytes: 0.02 10*3/uL (ref 0.00–0.07)
Basophils Absolute: 0.1 10*3/uL (ref 0.0–0.1)
Basophils Relative: 2 %
Eosinophils Absolute: 0.1 10*3/uL (ref 0.0–0.5)
Eosinophils Relative: 4 %
HCT: 28.4 % — ABNORMAL LOW (ref 39.0–52.0)
Hemoglobin: 8.8 g/dL — ABNORMAL LOW (ref 13.0–17.0)
Immature Granulocytes: 1 %
Lymphocytes Relative: 11 %
Lymphs Abs: 0.4 10*3/uL — ABNORMAL LOW (ref 0.7–4.0)
MCH: 28.6 pg (ref 26.0–34.0)
MCHC: 31 g/dL (ref 30.0–36.0)
MCV: 92.2 fL (ref 80.0–100.0)
Monocytes Absolute: 0.4 10*3/uL (ref 0.1–1.0)
Monocytes Relative: 12 %
Neutro Abs: 2.6 10*3/uL (ref 1.7–7.7)
Neutrophils Relative %: 70 %
Platelets: 194 10*3/uL (ref 150–400)
RBC: 3.08 MIL/uL — ABNORMAL LOW (ref 4.22–5.81)
RDW: 18.1 % — ABNORMAL HIGH (ref 11.5–15.5)
WBC: 3.7 10*3/uL — ABNORMAL LOW (ref 4.0–10.5)
nRBC: 0 % (ref 0.0–0.2)

## 2019-05-08 LAB — PROTIME-INR
INR: 1.1 (ref 0.8–1.2)
Prothrombin Time: 14.5 seconds (ref 11.4–15.2)

## 2019-05-08 LAB — BASIC METABOLIC PANEL
Anion gap: 11 (ref 5–15)
BUN: 45 mg/dL — ABNORMAL HIGH (ref 8–23)
CO2: 24 mmol/L (ref 22–32)
Calcium: 9.1 mg/dL (ref 8.9–10.3)
Chloride: 100 mmol/L (ref 98–111)
Creatinine, Ser: 1.95 mg/dL — ABNORMAL HIGH (ref 0.61–1.24)
GFR calc Af Amer: 39 mL/min — ABNORMAL LOW (ref >60)
GFR calc non Af Amer: 34 mL/min — ABNORMAL LOW (ref >60)
Glucose, Bld: 131 mg/dL — ABNORMAL HIGH (ref 70–99)
Potassium: 3.6 mmol/L (ref 3.5–5.1)
Sodium: 135 mmol/L (ref 135–145)

## 2019-05-08 MED ORDER — SODIUM CHLORIDE 0.9 % IV SOLN
INTRAVENOUS | Status: DC
  Administered 2019-05-08: 11:00:00 via INTRAVENOUS

## 2019-05-08 MED ORDER — HEPARIN SOD (PORK) LOCK FLUSH 100 UNIT/ML IV SOLN
500.0000 [IU] | INTRAVENOUS | Status: DC
  Filled 2019-05-08: qty 5

## 2019-05-08 MED ORDER — CEFAZOLIN SODIUM-DEXTROSE 2-4 GM/100ML-% IV SOLN
2.0000 g | INTRAVENOUS | Status: AC
  Administered 2019-05-08: 12:00:00 2 g via INTRAVENOUS

## 2019-05-08 MED ORDER — MIDAZOLAM HCL 2 MG/2ML IJ SOLN
INTRAMUSCULAR | Status: AC | PRN
  Administered 2019-05-08 (×3): 1 mg via INTRAVENOUS

## 2019-05-08 MED ORDER — HEPARIN SOD (PORK) LOCK FLUSH 100 UNIT/ML IV SOLN
500.0000 [IU] | INTRAVENOUS | Status: AC | PRN
  Administered 2019-05-08: 500 [IU]

## 2019-05-08 MED ORDER — CEFAZOLIN SODIUM-DEXTROSE 2-4 GM/100ML-% IV SOLN
INTRAVENOUS | Status: AC
  Administered 2019-05-08: 2 g via INTRAVENOUS
  Filled 2019-05-08: qty 100

## 2019-05-08 MED ORDER — MIDAZOLAM HCL 2 MG/2ML IJ SOLN
INTRAMUSCULAR | Status: AC
  Filled 2019-05-08: qty 2

## 2019-05-08 MED ORDER — FENTANYL CITRATE (PF) 100 MCG/2ML IJ SOLN
INTRAMUSCULAR | Status: AC | PRN
  Administered 2019-05-08 (×2): 50 ug via INTRAVENOUS

## 2019-05-08 MED ORDER — LIDOCAINE HCL 1 % IJ SOLN
INTRAMUSCULAR | Status: AC
  Filled 2019-05-08: qty 20

## 2019-05-08 MED ORDER — FENTANYL CITRATE (PF) 100 MCG/2ML IJ SOLN
INTRAMUSCULAR | Status: AC
  Filled 2019-05-08: qty 2

## 2019-05-08 MED ORDER — LIDOCAINE HCL (PF) 1 % IJ SOLN
INTRAMUSCULAR | Status: AC | PRN
  Administered 2019-05-08 (×2): 10 mL

## 2019-05-08 NOTE — H&P (Signed)
Referring Physician(s): Mohamed,Mohamed  Supervising Physician: Jacqulynn Cadet  Patient Status:  [facility]  Chief Complaint:  "I'm getting my chest catheter out"  Subjective: Patient familiar to IR service from multiple right thoracenteses and right Pleurx catheter placement on 11/15/2018 with subsequent TPA dwells on 01/06/2019 and 01/23/2019 secondary to poor output.  He underwent removal of Pleurx and new placement on 01/31/2019.  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  He has had minimal output from his right Pleurx catheter and presents today for removal.  He currently denies fever, headache, chest pain, worsening dyspnea, cough, abdominal/back pain, nausea, vomiting or bleeding.  Past Medical History:  Diagnosis Date  . Aortic atherosclerosis ([place]) 07/18/2018  . Cancer ([place])   . Cellulitis of right lower extremity   . Chronic kidney disease   . Diabetes mellitus without complication ([place])   . Dyslipidemia   . Hypertension   . Thoracic aortic aneurysm without rupture ([place]) 07/18/2018   Past Surgical History:  Procedure Laterality Date  . IR GUIDED DRAIN W CATHETER PLACEMENT  11/15/2018  . IR GUIDED DRAIN W CATHETER PLACEMENT  01/31/2019  . IR INSTILL VIA CHEST TUBE AGENT FOR FIBRINOLYSIS INI DAY  01/23/2019  . IR REMOVAL OF PLURAL CATH W/CUFF  01/31/2019  . IR THORACENTESIS ASP PLEURAL SPACE W/IMG GUIDE  09/05/2018  . IR THORACENTESIS ASP PLEURAL SPACE W/IMG GUIDE  10/11/2018  . IR THORACENTESIS ASP PLEURAL SPACE W/IMG GUIDE  10/30/2018  . PORTACATH PLACEMENT Left 02/21/2018   Procedure: INSERTION PORT-A-CATH;  Surgeon: Grace Isaac, MD;  Location: [facility];  Service: Thoracic;  Laterality: Left;       Allergies: Percocet [oxycodone-acetaminophen] and Lisinopril  Medications: Prior to Admission medications   Medication Sig Start Date End Date Taking? Authorizing Provider  albuterol (PROVENTIL HFA;VENTOLIN HFA) 108  (90 Base) MCG/ACT inhaler Inhale 2 puffs into the lungs every 4 (four) hours as needed for wheezing or shortness of breath. 08/03/18  Yes Byrum, Rose Fillers, MD  lidocaine-prilocaine (EMLA) cream Apply 1 application topically as needed. 02/16/18  Yes Curt Bears, MD  nystatin (MYCOSTATIN/NYSTOP) powder Apply topically 3 (three) times daily. 02/28/19  Yes Eugenie Filler, MD  fenofibrate 160 MG tablet Take 160 mg by mouth daily.  11/13/17   [provider]  furosemide (LASIX) 40 MG tablet Take 1 tablet (40 mg total) by mouth 2 (two) times daily. Take additional 40 mg tablet/day if weight is up more than 3 lbs in a day or 5 lbs in 2 day. 03/13/19   Lavina Hamman, MD  hydrOXYzine (ATARAX/VISTARIL) 10 MG tablet Take 1 tablet (10 mg total) by mouth 3 (three) times daily as needed. 03/27/19   Curt Bears, MD  Insulin Detemir (LEVEMIR FLEXTOUCH) 100 UNIT/ML Pen Inject 10 Units into the skin daily.  07/18/15   [provider]  Insulin Pen Needle (FIFTY50 PEN NEEDLES) 31G X 8 MM MISC USE AS DIRECTED 06/30/15   [provider]  Multiple Vitamin (MULTI-VITAMINS) TABS Take 1 tablet by mouth daily.     [provider]  Omega-3 1000 MG CAPS Take 1,200 mg by mouth 2 (two) times daily.     [provider]  pantoprazole (PROTONIX) 40 MG tablet Take 1 tablet (40 mg total) by mouth 2 (two) times daily before a meal for 14 days. 03/13/19 03/27/19  Lavina Hamman, MD  polyethylene glycol (MIRALAX / GLYCOLAX) 17 g packet Take 17  g by mouth daily. 03/01/19   Eugenie Filler, MD  Potassium Chloride ER 20 MEQ TBCR Take 20 mEq by mouth daily for 7 days. 03/13/19 03/20/19  Lavina Hamman, MD  pravastatin (PRAVACHOL) 40 MG tablet Take 40 mg by mouth daily.  01/08/18   [provider]  senna-docusate (SENOKOT-S) 8.6-50 MG tablet Take 2 tablets by mouth 2 (two) times daily. 02/28/19   Eugenie Filler, MD     Vital Signs: BP (!) 151/86 (BP Location: Right Arm)   Pulse 88    Temp 98.2 F (36.8 C) (Oral)   Resp 18   SpO2 100%   Physical Exam awake, alert.  Chest with diminished breath sounds on right, clear on left.  Right Pleurx catheter in place with clean bandage overlying.  Heart with regular rate and rhythm.  Abdomen obese, soft, positive bowel sounds, nontender.  Trace pretibial edema primarily on the left lower extremity.  Imaging: No results found.  Labs:  CBC: Recent Labs    03/13/19 0441 03/20/19 1158 04/02/19 1200 05/08/19 1038  WBC 6.8 5.3 5.4 3.7*  HGB 7.7* 8.4* 9.5* 8.8*  HCT 24.7* 26.8* 29.4* 28.4*  PLT 223 303 294 194    COAGS: Recent Labs    11/15/18 1133 01/31/19 0834 03/06/19 2258 05/08/19 1038  INR 1.3* 1.3* 1.3* 1.1  APTT  --  37*  --   --     BMP: Recent Labs    03/13/19 0441 03/20/19 1158 04/02/19 1200 05/08/19 1038  NA 135 136 134* 135  K 3.7 3.8 3.9 3.6  CL 104 101 98 100  CO2 22 24 24 24   GLUCOSE 121* 253* 183* 131*  BUN 30* 39* 63* 45*  CALCIUM 8.7* 9.7 9.9 9.1  CREATININE 1.48* 1.99* 2.43* 1.95*  GFRNONAA 47* 33* 26* 34*  GFRAA 55* 38* 30* 39*    LIVER FUNCTION TESTS: Recent Labs    03/06/19 2021 03/08/19 0458 03/20/19 1158 04/02/19 1200  BILITOT 1.2 2.0* 0.8 0.7  AST 46* 37 21 23  ALT 26 26 14 15   ALKPHOS 33* 34* 61 56  PROT 6.1* 6.7 7.5 7.7  ALBUMIN 3.1* 3.2* 3.2* 3.6    Assessment and Plan: Patient with history of squamous cell carcinoma of the right lung with recurrent right pleural effusions; s/p Pleurx catheter placement on 11/15/2018 followed by subsequent TPA dwells x2 and removal of Pleurx with new placement on 01/31/2019.  Patient now reports minimal output from Pleurx and presents today for removal.  Details/risks of procedure, including not limited to, internal bleeding, infection, injury to adjacent structures discussed with patient with his understanding and consent.   Electronically Signed: D. Rowe Robert, PA-C 05/08/2019, 11:37 AM   I spent a total of 20 minutes at the  the patient's bedside AND on the patient's hospital floor or unit, greater than 50% of which was counseling/coordinating care for right Pleurx catheter removal

## 2019-05-08 NOTE — Procedures (Signed)
Interventional Radiology Procedure Note  Procedure: Successful removal of right tunneled pleural catheter.  Complications: None  Estimated Blood Loss: None  Recommendations: - DC home after 1 hr  Signed,  Criselda Peaches, MD

## 2019-05-08 NOTE — Discharge Instructions (Addendum)
Moderate Conscious Sedation, Adult, Care After These instructions provide you with information about caring for yourself after your procedure. Your health care provider may also give you more specific instructions. Your treatment has been planned according to current medical practices, but problems sometimes occur. Call your health care provider if you have any problems or questions after your procedure. What can I expect after the procedure? After your procedure, it is common:  To feel sleepy for several hours.  To feel clumsy and have poor balance for several hours.  To have poor judgment for several hours.  To vomit if you eat too soon. Follow these instructions at home: For at least 24 hours after the procedure:   Do not: ? Participate in activities where you could fall or become injured. ? Drive. ? Use heavy machinery. ? Drink alcohol. ? Take sleeping pills or medicines that cause drowsiness. ? Make important decisions or sign legal documents. ? Take care of children on your own.  Rest. Eating and drinking  Follow the diet recommended by your health care provider.  If you vomit: ? Drink water, juice, or soup when you can drink without vomiting. ? Make sure you have little or no nausea before eating solid foods. General instructions  Have a responsible adult stay with you until you are awake and alert.  Take over-the-counter and prescription medicines only as told by your health care provider.  If you smoke, do not smoke without supervision.  Keep all follow-up visits as told by your health care provider. This is important. Contact a health care provider if:  You keep feeling nauseous or you keep vomiting.  You feel light-headed.  You develop a rash.  You have a fever. Get help right away if:  You have trouble breathing. This information is not intended to replace advice given to you by your health care provider. Make sure you discuss any questions you have  with your health care provider. Document Released: 04/18/2013 Document Revised: 06/10/2017 Document Reviewed: 10/18/2015 Elsevier Patient Education  [place], Adult An incision is a cut that a doctor makes in your skin for surgery (for a procedure). Most times, these cuts are closed after surgery. Your cut from surgery may be closed with stitches (sutures), staples, skin glue, or skin tape (adhesive strips). You may need to return to your doctor to have stitches or staples taken out. This may happen many days or many weeks after your surgery. The cut needs to be well cared for so it does not get infected. How to care for your cut Cut care   Follow instructions from your doctor about how to take care of your cut. Make sure you: ? Wash your hands with soap and water before you change your bandage (dressing). If you cannot use soap and water, use hand sanitizer. ? Change your bandage as told by your doctor.  You may remove your dressing tomorrow. ? Leave stitches, skin glue, or skin tape in place. They may need to stay in place for 2 weeks or longer. If tape strips get loose and curl up, you may trim the loose edges. Do not remove tape strips completely unless your doctor says it is okay.  Check your cut area every day for signs of infection. Check for: ? More redness, swelling, or pain. ? More fluid or blood. ? Warmth. ? Pus or a bad smell.  Ask your doctor how to clean the cut. This may include: ? Using  mild soap and water. ? Using a clean towel to pat the cut dry after you clean it. ? Putting a cream or ointment on the cut. Do this only as told by your doctor. ? Covering the cut with a clean bandage.  Ask your doctor when you can leave the cut uncovered.  Do not take baths, swim, or use a hot tub until your doctor says it is okay. Ask your doctor if you can take showers. You may only be allowed to take sponge baths for bathing.  You may shower  tomorrow. Medicines  If you were prescribed an antibiotic medicine, cream, or ointment, take the antibiotic or put it on the cut as told by your doctor. Do not stop taking or putting on the antibiotic even if your condition gets better.  Take over-the-counter and prescription medicines only as told by your doctor. General instructions  Limit movement around your cut. This helps healing. ? Avoid straining, lifting, or exercise for the first month, or for as long as told by your doctor. ? Follow instructions from your doctor about going back to your normal activities. ? Ask your doctor what activities are safe.  Protect your cut from the sun when you are outside for the first 6 months, or for as long as told by your doctor. Put on sunscreen around the scar or cover up the scar.  Keep all follow-up visits as told by your doctor. This is important. Contact a doctor if:  Your have more redness, swelling, or pain around the cut.  You have more fluid or blood coming from the cut.  Your cut feels warm to the touch.  You have pus or a bad smell coming from the cut.  You have a fever or shaking chills.  You feel sick to your stomach (nauseous) or you throw up (vomit).  You are dizzy.  Your stitches or staples come undone. Get help right away if:  You have a red streak coming from your cut.  Your cut bleeds through the bandage and the bleeding does not stop with gentle pressure.  The edges of your cut open up and separate.  You have very bad (severe) pain.  You have a rash.  You are confused.  You pass out (faint).  You have trouble breathing and you have a fast heartbeat. This information is not intended to replace advice given to you by your health care provider. Make sure you discuss any questions you have with your health care provider. Document Released: 09/20/2011 Document Revised: 11/15/2016 Document Reviewed: 03/05/2016 Elsevier Patient Education  2020 [employer].

## 2019-05-11 NOTE — Telephone Encounter (Signed)
I spoke with Sonia Side. He would like med list faxed to him if I am able to print it.

## 2019-05-14 NOTE — Telephone Encounter (Signed)
faxed

## 2019-06-06 ENCOUNTER — Encounter: Payer: Self-pay | Admitting: Orthopaedic Surgery

## 2019-06-06 ENCOUNTER — Other Ambulatory Visit: Payer: Self-pay

## 2019-06-06 ENCOUNTER — Ambulatory Visit: Payer: Medicare Other | Admitting: Orthopaedic Surgery

## 2019-06-06 VITALS — BP 153/89 | HR 85 | Ht 68.0 in | Wt 239.4 lb

## 2019-06-06 DIAGNOSIS — S42202D Unspecified fracture of upper end of left humerus, subsequent encounter for fracture with routine healing: Secondary | ICD-10-CM

## 2019-06-06 NOTE — Progress Notes (Signed)
Office Visit Note   Patient: Darrell [name]           Date of Birth: [birth year]           MRN: [id number] Visit Date: 06/06/2019              Requested by: [name], MD No address on file PCP: [name], MD   Assessment & Plan: Visit Diagnoses:  1. Closed fracture of proximal end of left humerus with routine healing, unspecified fracture morphology, subsequent encounter     Plan: Proximal humerus fracture left shoulder is healed moving in 1 piece.  He has functional motion again is back to normal activities.  Follow-up here will be on a as needed basis.  Follow-Up Instructions: Return if symptoms worsen or fail to improve.   Orders:  No orders of the defined types were placed in this encounter.  No orders of the defined types were placed in this encounter.     Procedures: No procedures performed   Clinical Data: No additional findings.   Subjective: Chief Complaint  Patient presents with  . Left Shoulder - Follow-up    HPI 73 year old male returns for follow-up of left proximal humerus fracture.  His daughter is a physical therapist has helped him with therapy is able get his arm to the top of his head and lacks about 10 inches from reaching his high with his left versus his right arm.  He had lung cancer is finished his immunotherapy radiation chemotherapy protocol.  He denies any pain in his shoulder and using his shoulder for activities of daily living.  Review of Systems 14 point system updated unchanged from above.   Objective: Vital Signs: BP (!) 153/89   Pulse 85   Ht 5\' 8"  (1.727 m)   Wt 239 lb 6.4 oz (108.6 kg)   BMI 36.40 kg/m   Physical Exam Constitutional:      Appearance: He is well-developed.  HENT:     Head: Normocephalic and atraumatic.  Eyes:     Pupils: Pupils are equal, round, and reactive to light.  Neck:     Thyroid: No thyromegaly.     Trachea: No tracheal deviation.  Cardiovascular:     Rate and Rhythm: Normal  rate.  Pulmonary:     Effort: Pulmonary effort is normal. No respiratory distress.     Breath sounds: No wheezing.  Abdominal:     General: Bowel sounds are normal.     Palpations: Abdomen is soft.  Skin:    General: Skin is warm and dry.     Capillary Refill: Capillary refill takes less than 2 seconds.  Neurological:     Mental Status: He is alert and oriented to person, place, and time.  Psychiatric:        Behavior: Behavior normal.        Thought Content: Thought content normal.        Judgment: Judgment normal.     Ortho Exam patient can his arm to the top of his head easily.  Lacks about 10 inches of overhead reaching compared to his opposite right shoulder which still has slight limitation.  He can reach across to his opposite shoulder.  Internal rotation arm to posterior axillary line.  Specialty Comments:  No specialty comments available.  Imaging: No results found.   PMFS History: Patient Active Problem List   Diagnosis Date Noted  . Anemia 03/07/2019  . Acute upper GI bleed 03/07/2019  .  Pain and swelling of right upper extremity   . Acute diastolic CHF (congestive heart failure) ([place]) 02/22/2019  . Closed fracture of left proximal humerus 02/02/2019  . Hypokalemia 09/18/2018  . Dyspnea 08/29/2018  . COPD (chronic obstructive pulmonary disease) ([place]) 08/03/2018  . Hemoptysis 07/18/2018  . CKD (chronic kidney disease) stage 3, GFR 30-59 ml/min 07/18/2018  . Lobar pneumonia ([place]) 07/18/2018  . Thoracic aortic aneurysm without rupture ([place]) 07/18/2018  . Aortic atherosclerosis ([place]) 07/18/2018  . Encounter for antineoplastic immunotherapy 05/17/2018  . Port-A-Cath in place 03/27/2018  . Encounter for antineoplastic chemotherapy 02/16/2018  . 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 Goals of care, counseling/discussion 02/07/2018  . ANGIOKERATOMA, BLEEDING 10/06/2006  . DM2 (diabetes mellitus, type 2) ([place]) 10/06/2006  . HYPERLIPIDEMIA 10/06/2006  .  HYPERTENSION, BENIGN 10/06/2006  . LATERAL MENISCUS TEAR, RIGHT 10/06/2006  . CHOLECYSTECTOMY, HX OF 10/06/2006   Past Medical History:  Diagnosis Date  . Aortic atherosclerosis ([place]) 07/18/2018  . Cancer ([place])   . Cellulitis of right lower extremity   . Chronic kidney disease   . Diabetes mellitus without complication ([place])   . Dyslipidemia   . Hypertension   . Thoracic aortic aneurysm without rupture ([place]) 07/18/2018    Family History  Problem Relation Age of Onset  . Breast cancer Mother   . CAD Father     Past Surgical History:  Procedure Laterality Date  . IR GUIDED DRAIN W CATHETER PLACEMENT  11/15/2018  . IR GUIDED DRAIN W CATHETER PLACEMENT  01/31/2019  . IR INSTILL VIA CHEST TUBE AGENT FOR FIBRINOLYSIS INI DAY  01/23/2019  . IR REMOVAL OF PLURAL CATH W/CUFF  01/31/2019  . IR REMOVAL OF PLURAL CATH W/CUFF  05/08/2019  . IR THORACENTESIS ASP PLEURAL SPACE W/IMG GUIDE  09/05/2018  . IR THORACENTESIS ASP PLEURAL SPACE W/IMG GUIDE  10/11/2018  . IR THORACENTESIS ASP PLEURAL SPACE W/IMG GUIDE  10/30/2018  . PORTACATH PLACEMENT Left 02/21/2018   Procedure: INSERTION PORT-A-CATH;  Surgeon: Grace Isaac, MD;  Location[facility];  Service: Thoracic;  Laterality: Left;   Social History   Occupational History  . Not on file  Tobacco Use  . Smoking status: Former Smoker    Packs/day: 2.00    Years: 35.00    Pack years: 70.00    Types: Cigarettes    Quit date: 07/12/1996    Years since quitting: 22.9  . Smokeless tobacco: Never Used  Substance and Sexual Activity  . Alcohol use: Yes    Comment: occasionally  . Drug use: Never  . Sexual activity: Not Currently

## 2019-07-04 ENCOUNTER — Ambulatory Visit (HOSPITAL_COMMUNITY)
Admission: RE | Admit: 2019-07-04 | Discharge: 2019-07-04 | Disposition: A | Discharge status: Home / Self Care | Payer: Medicare Other | Source: Ambulatory Visit | Attending: Internal Medicine | Admitting: Internal Medicine

## 2019-07-04 ENCOUNTER — Inpatient Hospital Stay: Payer: Medicare Other | Attending: Internal Medicine

## 2019-07-04 ENCOUNTER — Encounter (HOSPITAL_COMMUNITY): Payer: Self-pay

## 2019-07-04 ENCOUNTER — Other Ambulatory Visit: Payer: Self-pay

## 2019-07-04 ENCOUNTER — Telehealth: Payer: Self-pay | Admitting: Medical Oncology

## 2019-07-04 ENCOUNTER — Other Ambulatory Visit: Payer: Self-pay | Admitting: Internal Medicine

## 2019-07-04 DIAGNOSIS — I700 Atherosclerosis of aorta: Secondary | ICD-10-CM | POA: Diagnosis not present

## 2019-07-04 DIAGNOSIS — C7801 Secondary malignant neoplasm of right lung: Secondary | ICD-10-CM | POA: Diagnosis present

## 2019-07-04 DIAGNOSIS — E785 Hyperlipidemia, unspecified: Secondary | ICD-10-CM | POA: Diagnosis not present

## 2019-07-04 DIAGNOSIS — R21 Rash and other nonspecific skin eruption: Secondary | ICD-10-CM | POA: Diagnosis not present

## 2019-07-04 DIAGNOSIS — C3490 Malignant neoplasm of unspecified part of unspecified bronchus or lung: Secondary | ICD-10-CM

## 2019-07-04 DIAGNOSIS — E119 Type 2 diabetes mellitus without complications: Secondary | ICD-10-CM | POA: Diagnosis not present

## 2019-07-04 DIAGNOSIS — I712 Thoracic aortic aneurysm, without rupture: Secondary | ICD-10-CM | POA: Insufficient documentation

## 2019-07-04 DIAGNOSIS — J189 Pneumonia, unspecified organism: Secondary | ICD-10-CM | POA: Insufficient documentation

## 2019-07-04 DIAGNOSIS — Z794 Long term (current) use of insulin: Secondary | ICD-10-CM | POA: Diagnosis not present

## 2019-07-04 DIAGNOSIS — N189 Chronic kidney disease, unspecified: Secondary | ICD-10-CM | POA: Diagnosis not present

## 2019-07-04 DIAGNOSIS — I129 Hypertensive chronic kidney disease with stage 1 through stage 4 chronic kidney disease, or unspecified chronic kidney disease: Secondary | ICD-10-CM | POA: Diagnosis not present

## 2019-07-04 DIAGNOSIS — Z79899 Other long term (current) drug therapy: Secondary | ICD-10-CM | POA: Insufficient documentation

## 2019-07-04 HISTORY — DX: Malignant neoplasm of prostate: C61

## 2019-07-04 LAB — CBC WITH DIFFERENTIAL (CANCER CENTER ONLY)
Abs Immature Granulocytes: 0.01 10*3/uL (ref 0.00–0.07)
Basophils Absolute: 0 10*3/uL (ref 0.0–0.1)
Basophils Relative: 1 %
Eosinophils Absolute: 0.2 10*3/uL (ref 0.0–0.5)
Eosinophils Relative: 4 %
HCT: 34.1 % — ABNORMAL LOW (ref 39.0–52.0)
Hemoglobin: 10.9 g/dL — ABNORMAL LOW (ref 13.0–17.0)
Immature Granulocytes: 0 %
Lymphocytes Relative: 11 %
Lymphs Abs: 0.4 10*3/uL — ABNORMAL LOW (ref 0.7–4.0)
MCH: 29.5 pg (ref 26.0–34.0)
MCHC: 32 g/dL (ref 30.0–36.0)
MCV: 92.2 fL (ref 80.0–100.0)
Monocytes Absolute: 0.4 10*3/uL (ref 0.1–1.0)
Monocytes Relative: 9 %
Neutro Abs: 3 10*3/uL (ref 1.7–7.7)
Neutrophils Relative %: 75 %
Platelet Count: 205 10*3/uL (ref 150–400)
RBC: 3.7 MIL/uL — ABNORMAL LOW (ref 4.22–5.81)
RDW: 17 % — ABNORMAL HIGH (ref 11.5–15.5)
WBC Count: 4 10*3/uL (ref 4.0–10.5)
nRBC: 0 % (ref 0.0–0.2)

## 2019-07-04 LAB — CMP (CANCER CENTER ONLY)
ALT: 13 U/L (ref 0–44)
AST: 22 U/L (ref 15–41)
Albumin: 3.8 g/dL (ref 3.5–5.0)
Alkaline Phosphatase: 52 U/L (ref 38–126)
Anion gap: 11 (ref 5–15)
BUN: 40 mg/dL — ABNORMAL HIGH (ref 8–23)
CO2: 29 mmol/L (ref 22–32)
Calcium: 9.4 mg/dL (ref 8.9–10.3)
Chloride: 98 mmol/L (ref 98–111)
Creatinine: 1.94 mg/dL — ABNORMAL HIGH (ref 0.61–1.24)
GFR, Est AFR Am: 39 mL/min — ABNORMAL LOW (ref >60)
GFR, Estimated: 34 mL/min — ABNORMAL LOW (ref >60)
Glucose, Bld: 151 mg/dL — ABNORMAL HIGH (ref 70–99)
Potassium: 4.1 mmol/L (ref 3.5–5.1)
Sodium: 138 mmol/L (ref 135–145)
Total Bilirubin: 0.7 mg/dL (ref 0.3–1.2)
Total Protein: 7.5 g/dL (ref 6.5–8.1)

## 2019-07-04 MED ORDER — LEVOFLOXACIN 500 MG PO TABS: 500.0000 mg | ORAL_TABLET | Freq: Every day | ORAL | 0 refills | Status: DC

## 2019-07-04 NOTE — Telephone Encounter (Signed)
-----   Message from Curt Bears, MD sent at 07/04/2019  2:44 PM EST ----- Questionable pneumonia on the CT scan.  I order Levaquin for 7 days.  Please let the patient knows. ----- Message ----- From: Interface, Rad Results In Sent: 07/04/2019  12:06 PM EST To: Curt Bears, MD

## 2019-07-04 NOTE — Telephone Encounter (Signed)
Pt.notified

## 2019-07-09 ENCOUNTER — Encounter: Payer: Self-pay | Admitting: Internal Medicine

## 2019-07-09 ENCOUNTER — Other Ambulatory Visit: Payer: Self-pay

## 2019-07-09 ENCOUNTER — Inpatient Hospital Stay: Payer: Medicare Other | Admitting: Internal Medicine

## 2019-07-09 VITALS — BP 146/86 | HR 93 | Temp 98.0°F | Resp 19 | Ht 68.0 in | Wt 238.4 lb

## 2019-07-09 DIAGNOSIS — Z5111 Encounter for antineoplastic chemotherapy: Secondary | ICD-10-CM

## 2019-07-09 DIAGNOSIS — C3490 Malignant neoplasm of unspecified part of unspecified bronchus or lung: Secondary | ICD-10-CM

## 2019-07-09 DIAGNOSIS — C7801 Secondary malignant neoplasm of right lung: Secondary | ICD-10-CM | POA: Diagnosis not present

## 2019-07-09 DIAGNOSIS — I10 Essential (primary) hypertension: Secondary | ICD-10-CM | POA: Diagnosis not present

## 2019-07-09 DIAGNOSIS — C3411 Malignant neoplasm of upper lobe, right bronchus or lung: Secondary | ICD-10-CM | POA: Diagnosis not present

## 2019-07-09 NOTE — Progress Notes (Signed)
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 First dose 02/27/2018.Status post 5 cycles. 2) Consolidationimmunotherapy with Imfinzi 10 mg/kg every 2 weeks.First dose given on 05/17/2018.Status post 18 cycles discontinued secondary to intolerance.   CURRENT THERAPY:Observation.  INTERVAL HISTORY: Darrell [name] 73 y.o. male returns to the clinic today for 3 months follow-up visit.  The patient his wife was available by phone during the visit.  He is feeling fine today with no concerning complaints except for shortness of breath that improved after starting treatment with Levaquin 5 days ago.  He denied having any chest pain but has mild cough with no hemoptysis.  He denied having any current fever or chills.  He has no nausea, vomiting, diarrhea or constipation.  He has no headache or visual changes.  He is here today for evaluation with repeat CT scan of the chest for restaging of his disease.  MEDICAL HISTORY: Past Medical History:  Diagnosis Date  . Aortic atherosclerosis ([place]) 07/18/2018  . Cellulitis of right lower extremity   . Chronic kidney disease   . Diabetes mellitus without complication ([place])   . Dyslipidemia   . Hypertension   . Prostate CA ([place]) dx'd 2012  . rt lung ca dx'd 01/2018  . Thoracic aortic aneurysm without rupture ([place]) 07/18/2018    ALLERGIES:  is allergic to percocet [oxycodone-acetaminophen] and lisinopril.  MEDICATIONS:  Current Outpatient Medications  Medication Sig Dispense Refill  . albuterol (PROVENTIL HFA;VENTOLIN HFA) 108 (90 Base) MCG/ACT  inhaler Inhale 2 puffs into the lungs every 4 (four) hours as needed for wheezing or shortness of breath. 1 Inhaler 5  . fenofibrate 160 MG tablet Take 160 mg by mouth daily.     . furosemide (LASIX) 40 MG tablet Take 1 tablet (40 mg total) by mouth 2 (two) times daily. Take additional 40 mg tablet/day if weight is up more than 3 lbs in a day or 5 lbs in 2 day. 60 tablet 0  . hydrOXYzine (ATARAX/VISTARIL) 10 MG tablet Take 1 tablet (10 mg total) by mouth 3 (three) times daily as needed. 30 tablet 0  . Insulin Detemir (LEVEMIR FLEXTOUCH) 100 UNIT/ML Pen Inject 10 Units into the skin daily.     . Insulin Pen Needle (FIFTY50 PEN NEEDLES) 31G X 8 MM MISC USE AS DIRECTED    . lidocaine-prilocaine (EMLA) cream Apply 1 application topically as needed. 30 g 0  . Multiple Vitamin (MULTI-VITAMINS) TABS Take 1 tablet by mouth daily.     . Omega-3 1000 MG CAPS Take 1,200 mg by mouth 2 (two) times daily.     . polyethylene glycol (MIRALAX / GLYCOLAX) 17 g packet Take 17 g by mouth daily. 14 each 0  . Potassium Chloride ER 20 MEQ TBCR Take 20 mEq by mouth daily for 7 days. 7 tablet 0  . pravastatin (PRAVACHOL) 40 MG tablet Take 40 mg by mouth daily.     Marland Kitchen senna-docusate (SENOKOT-S) 8.6-50 MG tablet Take 2 tablets by mouth 2 (two) times daily.     No current facility-administered medications for this visit.   Facility-Administered Medications Ordered in Other Visits  Medication Dose Route Frequency Provider Last Rate Last Admin  . sodium chloride flush (NS) 0.9 % injection 10 mL  10 mL Intracatheter PRN Curt Bears, MD   10 mL at 06/28/18 1501    SURGICAL HISTORY:  Past Surgical History:  Procedure Laterality Date  . IR GUIDED DRAIN W CATHETER PLACEMENT  11/15/2018  . IR GUIDED DRAIN W CATHETER PLACEMENT  01/31/2019  . IR INSTILL VIA CHEST TUBE AGENT FOR FIBRINOLYSIS INI DAY  01/23/2019  . IR REMOVAL OF PLURAL CATH W/CUFF  01/31/2019  . IR REMOVAL OF PLURAL CATH W/CUFF  05/08/2019  . IR THORACENTESIS  ASP PLEURAL SPACE W/IMG GUIDE  09/05/2018  . IR THORACENTESIS ASP PLEURAL SPACE W/IMG GUIDE  10/11/2018  . IR THORACENTESIS ASP PLEURAL SPACE W/IMG GUIDE  10/30/2018  . PORTACATH PLACEMENT Left 02/21/2018   Procedure: INSERTION PORT-A-CATH;  Surgeon: Grace Isaac, MD;  Location: [facility] OR;  Service: Thoracic;  Laterality: Left;    REVIEW OF SYSTEMS:  Constitutional: positive for fatigue Eyes: negative Ears, nose, mouth, throat, and face: negative Respiratory: positive for cough and dyspnea on exertion Cardiovascular: negative Gastrointestinal: negative Genitourinary:negative Integument/breast: negative Hematologic/lymphatic: negative Musculoskeletal:negative Neurological: negative Behavioral/Psych: negative Endocrine: negative Allergic/Immunologic: negative   PHYSICAL EXAMINATION: General appearance: alert, cooperative, fatigued and no distress Head: Normocephalic, without obvious abnormality, atraumatic Neck: no adenopathy, no JVD, supple, symmetrical, trachea midline and thyroid not enlarged, symmetric, no tenderness/mass/nodules Lymph nodes: Cervical, supraclavicular, and axillary nodes normal. Resp: clear to auscultation bilaterally Back: symmetric, no curvature. ROM normal. No CVA tenderness. Cardio: regular rate and rhythm, S1, S2 normal, no murmur, click, rub or gallop GI: soft, non-tender; bowel sounds normal; no masses,  no organomegaly Extremities: extremities normal, atraumatic, no cyanosis or edema Neurologic: Alert and oriented X 3, normal strength and tone. Normal symmetric reflexes. Normal coordination and gait  ECOG PERFORMANCE STATUS: 1 - Symptomatic but completely ambulatory  Blood pressure (!) 146/86, pulse 93, temperature 98 F (36.7 C), temperature source Temporal, resp. rate 19, height 5\' 8"  (1.727 m), weight 238 lb 6.4 oz (108.1 kg), SpO2 100 %.  LABORATORY DATA: Lab Results  Component Value Date   WBC 4.0 07/04/2019   HGB 10.9 (L) 07/04/2019   HCT 34.1  (L) 07/04/2019   MCV 92.2 07/04/2019   PLT 205 07/04/2019      Chemistry      Component Value Date/Time   NA 138 07/04/2019 0924   K 4.1 07/04/2019 0924   CL 98 07/04/2019 0924   CO2 29 07/04/2019 0924   BUN 40 (H) 07/04/2019 0924   CREATININE 1.94 (H) 07/04/2019 0924      Component Value Date/Time   CALCIUM 9.4 07/04/2019 0924   ALKPHOS 52 07/04/2019 0924   AST 22 07/04/2019 0924   ALT 13 07/04/2019 0924   BILITOT 0.7 07/04/2019 0924       RADIOGRAPHIC STUDIES: CT Chest Wo Contrast  Result Date: 07/04/2019 CLINICAL DATA:  Non-small-cell lung cancer.  Restaging. EXAM: CT CHEST WITHOUT CONTRAST TECHNIQUE: Multidetector CT imaging of the chest was performed following the standard protocol without IV contrast. COMPARISON:  04/02/2019 FINDINGS: Cardiovascular: The heart size is normal. No substantial pericardial effusion. Coronary artery calcification is evident. Atherosclerotic calcification is noted in the wall of the thoracic aorta. Ascending thoracic aorta measures 4.3 cm diameter. Left Port-A-Cath tip in the mid right atrium. Mediastinum/Nodes: No mediastinal lymphadenopathy. No evidence for gross hilar lymphadenopathy although assessment is limited by the lack of intravenous contrast on today's study. The esophagus  has normal imaging features. There is no axillary lymphadenopathy. Lungs/Pleura: Similar appearance volume loss and post treatment scarring in the parahilar right lung there is new patchy airspace disease in the posterior right lung base. The pleural drain has been removed in the interval without substantial progression of right pleural fluid collection. Tiny subpleural nodule left apex is stable. Upper Abdomen: Unremarkable. Musculoskeletal: No worrisome lytic or sclerotic osseous abnormality. Posttraumatic deformity noted left humeral neck. IMPRESSION: 1. Interval development of patchy airspace disease in the posterior right lung base concerning for pneumonia. 2. Interval  removal of right pleural drain with stable appearance of right chronic pleural effusion. 3. Post treatment changes in the right parahilar lung are similar to prior. 4. 4.3 cm ascending thoracic aortic aneurysm. Continued attention on follow-up surveillance scans recommended. 5.  Aortic Atherosclerois (ICD10-170.0) Electronically Signed   By: Misty Stanley M.D.   On: 07/04/2019 12:03    ASSESSMENT AND PLAN: This is a very pleasant 73 years old white male with a stage IIb/IIIa non-small cell lung cancer, squamous cell carcinoma diagnosed in July 2019. The patient is currently undergoing a course of concurrent chemoradiation with weekly carboplatin and paclitaxel status post 5 cycles.  He had partial response after the initial induction treatment. The patient was started on treatment with consolidation Imfinzi status post 18 cycles.  The patient has been tolerating his treatment well except for the skin rash and itching.  He reaches the point that he could not sleep from the itching. I had a lengthy discussion with the patient and his wife today about his condition.  He completed 18 cycles of this treatment.  The patient has a lot of other comorbidities especially the recent diagnosis with congestive heart failure. The patient is currently on observation and he is feeling fine. He had repeat CT scan of the chest performed recently.  I personally and independently reviewed the scan images and discussed the results with the patient and his wife. His scan showed no concerning findings for disease progression but there was airspace disease in the right lung suspicious for pneumonia. I recommended for the patient to continue on observation with repeat CT scan of the chest in 3 months. For the pneumonia I started the patient on Levaquin 500 mg p.o. daily for 7 days.  He has 2 more days to finish this course. For the congestive heart failure he has aggressive diuresis and he will continue his current treatment  by cardiology. For the left shoulder fracture, he is managed conservatively by orthopedic surgery. For the renal insufficiency, this is likely secondary to aggressive diuresis and the patient is now currently off Lasix and hopefully his numbers will improve.  He was advised to increase his hydration. The patient was advised to call immediately if he has any concerning symptoms in the interval. The patient voices understanding of current disease status and treatment options and is in agreement with the current care plan. All questions were answered. The patient knows to call the clinic with any problems, questions or concerns. We can certainly see the patient much sooner if necessary.  Disclaimer: This note was dictated with voice recognition software. Similar sounding words can inadvertently be transcribed and may not be corrected upon review.

## 2019-07-10 ENCOUNTER — Telehealth: Payer: Self-pay | Admitting: Internal Medicine

## 2019-07-10 NOTE — Telephone Encounter (Signed)
Scheduled per los. Called and spoke with patient. Confirmed appt 

## 2019-10-05 ENCOUNTER — Other Ambulatory Visit: Payer: Self-pay

## 2019-10-05 ENCOUNTER — Inpatient Hospital Stay: Payer: Medicare Other | Attending: Internal Medicine

## 2019-10-05 ENCOUNTER — Ambulatory Visit (HOSPITAL_COMMUNITY)
Admission: RE | Admit: 2019-10-05 | Discharge: 2019-10-05 | Disposition: A | Discharge status: Home / Self Care | Payer: Medicare Other | Source: Ambulatory Visit | Attending: Internal Medicine | Admitting: Internal Medicine

## 2019-10-05 DIAGNOSIS — C3401 Malignant neoplasm of right main bronchus: Secondary | ICD-10-CM | POA: Insufficient documentation

## 2019-10-05 DIAGNOSIS — E785 Hyperlipidemia, unspecified: Secondary | ICD-10-CM | POA: Insufficient documentation

## 2019-10-05 DIAGNOSIS — Z79899 Other long term (current) drug therapy: Secondary | ICD-10-CM | POA: Diagnosis not present

## 2019-10-05 DIAGNOSIS — N189 Chronic kidney disease, unspecified: Secondary | ICD-10-CM | POA: Insufficient documentation

## 2019-10-05 DIAGNOSIS — R21 Rash and other nonspecific skin eruption: Secondary | ICD-10-CM | POA: Diagnosis not present

## 2019-10-05 DIAGNOSIS — J984 Other disorders of lung: Secondary | ICD-10-CM | POA: Insufficient documentation

## 2019-10-05 DIAGNOSIS — I509 Heart failure, unspecified: Secondary | ICD-10-CM | POA: Insufficient documentation

## 2019-10-05 DIAGNOSIS — C3490 Malignant neoplasm of unspecified part of unspecified bronchus or lung: Secondary | ICD-10-CM

## 2019-10-05 DIAGNOSIS — I700 Atherosclerosis of aorta: Secondary | ICD-10-CM | POA: Diagnosis not present

## 2019-10-05 DIAGNOSIS — E119 Type 2 diabetes mellitus without complications: Secondary | ICD-10-CM | POA: Insufficient documentation

## 2019-10-05 DIAGNOSIS — I130 Hypertensive heart and chronic kidney disease with heart failure and stage 1 through stage 4 chronic kidney disease, or unspecified chronic kidney disease: Secondary | ICD-10-CM | POA: Insufficient documentation

## 2019-10-05 DIAGNOSIS — Z8673 Personal history of transient ischemic attack (TIA), and cerebral infarction without residual deficits: Secondary | ICD-10-CM | POA: Diagnosis not present

## 2019-10-05 DIAGNOSIS — Z794 Long term (current) use of insulin: Secondary | ICD-10-CM | POA: Diagnosis not present

## 2019-10-05 DIAGNOSIS — C7801 Secondary malignant neoplasm of right lung: Secondary | ICD-10-CM | POA: Diagnosis present

## 2019-10-05 LAB — CMP (CANCER CENTER ONLY)
ALT: 18 U/L (ref 0–44)
AST: 23 U/L (ref 15–41)
Albumin: 3.8 g/dL (ref 3.5–5.0)
Alkaline Phosphatase: 72 U/L (ref 38–126)
Anion gap: 10 (ref 5–15)
BUN: 30 mg/dL — ABNORMAL HIGH (ref 8–23)
CO2: 25 mmol/L (ref 22–32)
Calcium: 9.3 mg/dL (ref 8.9–10.3)
Chloride: 103 mmol/L (ref 98–111)
Creatinine: 1.81 mg/dL — ABNORMAL HIGH (ref 0.61–1.24)
GFR, Est AFR Am: 43 mL/min — ABNORMAL LOW (ref >60)
GFR, Estimated: 37 mL/min — ABNORMAL LOW (ref >60)
Glucose, Bld: 153 mg/dL — ABNORMAL HIGH (ref 70–99)
Potassium: 3.9 mmol/L (ref 3.5–5.1)
Sodium: 138 mmol/L (ref 135–145)
Total Bilirubin: 0.6 mg/dL (ref 0.3–1.2)
Total Protein: 7.4 g/dL (ref 6.5–8.1)

## 2019-10-05 LAB — CBC WITH DIFFERENTIAL (CANCER CENTER ONLY)
Abs Immature Granulocytes: 0.02 10*3/uL (ref 0.00–0.07)
Basophils Absolute: 0.1 10*3/uL (ref 0.0–0.1)
Basophils Relative: 1 %
Eosinophils Absolute: 0.2 10*3/uL (ref 0.0–0.5)
Eosinophils Relative: 4 %
HCT: 37.9 % — ABNORMAL LOW (ref 39.0–52.0)
Hemoglobin: 12.4 g/dL — ABNORMAL LOW (ref 13.0–17.0)
Immature Granulocytes: 0 %
Lymphocytes Relative: 10 %
Lymphs Abs: 0.5 10*3/uL — ABNORMAL LOW (ref 0.7–4.0)
MCH: 29.4 pg (ref 26.0–34.0)
MCHC: 32.7 g/dL (ref 30.0–36.0)
MCV: 89.8 fL (ref 80.0–100.0)
Monocytes Absolute: 0.4 10*3/uL (ref 0.1–1.0)
Monocytes Relative: 7 %
Neutro Abs: 4.2 10*3/uL (ref 1.7–7.7)
Neutrophils Relative %: 78 %
Platelet Count: 190 10*3/uL (ref 150–400)
RBC: 4.22 MIL/uL (ref 4.22–5.81)
RDW: 15.8 % — ABNORMAL HIGH (ref 11.5–15.5)
WBC Count: 5.4 10*3/uL (ref 4.0–10.5)
nRBC: 0 % (ref 0.0–0.2)

## 2019-10-08 ENCOUNTER — Encounter: Payer: Self-pay | Admitting: Internal Medicine

## 2019-10-08 ENCOUNTER — Inpatient Hospital Stay: Payer: Medicare Other | Admitting: Internal Medicine

## 2019-10-08 ENCOUNTER — Other Ambulatory Visit: Payer: Self-pay

## 2019-10-08 VITALS — BP 143/86 | HR 89 | Temp 98.7°F | Resp 18 | Ht 68.0 in | Wt 239.7 lb

## 2019-10-08 DIAGNOSIS — I10 Essential (primary) hypertension: Secondary | ICD-10-CM | POA: Diagnosis not present

## 2019-10-08 DIAGNOSIS — C3411 Malignant neoplasm of upper lobe, right bronchus or lung: Secondary | ICD-10-CM | POA: Diagnosis not present

## 2019-10-08 DIAGNOSIS — C3401 Malignant neoplasm of right main bronchus: Secondary | ICD-10-CM | POA: Diagnosis not present

## 2019-10-08 DIAGNOSIS — C3490 Malignant neoplasm of unspecified part of unspecified bronchus or lung: Secondary | ICD-10-CM | POA: Diagnosis not present

## 2019-10-08 NOTE — Progress Notes (Signed)
[place] [phone]   Fax:([phone]  OFFICE PROGRESS NOTE  [name], MD No address on file  DIAGNOSIS: Stage IIb/IIIa (T2b, N0/N2, M0), non-small cell lung cancer, squamous cell carcinoma diagnosed in July 2019 and presented with large right hilar mass with questionable mediastinal invasion  PRIOR THERAPY:  1) Concurrent chemoradiation with weekly carboplatin for AUC of 2 and paclitaxel 45 mg/M2. First dose 02/27/2018.Status post 5 cycles. 2) Consolidationimmunotherapy with Imfinzi 10 mg/kg every 2 weeks.First dose given on 05/17/2018.Status post 18 cycles discontinued secondary to intolerance.  CURRENT THERAPY:Observation.  INTERVAL HISTORY: Darrell [name] 74 y.o. male returns to the clinic today for follow-up visit.  His wife was available by phone during the visit.  The patient is feeling fine today with no concerning complaints except for shortness of breath with exertion.  He denied having any chest pain, cough or hemoptysis.  He denied having any fever or chills.  He has more energy than before and working at regular basis.  He denied having any nausea, vomiting, diarrhea or constipation.  He denied having any headache or visual changes.  He has no change in his weight.  The patient had repeat CT scan of the chest performed recently and he is here for evaluation and discussion of his discuss results.  MEDICAL HISTORY: Past Medical History:  Diagnosis Date  . Aortic atherosclerosis ([place]) 07/18/2018  . Cellulitis of right lower extremity   . Chronic kidney disease   . Diabetes mellitus without complication ([place])   . Dyslipidemia   . Hypertension   . Prostate CA ([place]) dx'd 2012  . rt lung ca dx'd 01/2018  . Thoracic aortic aneurysm without rupture ([place]) 07/18/2018    ALLERGIES:  is allergic to percocet [oxycodone-acetaminophen] and lisinopril.  MEDICATIONS:  Current Outpatient Medications  Medication Sig Dispense Refill  .  albuterol (PROVENTIL HFA;VENTOLIN HFA) 108 (90 Base) MCG/ACT inhaler Inhale 2 puffs into the lungs every 4 (four) hours as needed for wheezing or shortness of breath. 1 Inhaler 5  . fenofibrate 160 MG tablet Take 160 mg by mouth daily.     . furosemide (LASIX) 40 MG tablet Take 1 tablet (40 mg total) by mouth 2 (two) times daily. Take additional 40 mg tablet/day if weight is up more than 3 lbs in a day or 5 lbs in 2 day. 60 tablet 0  . hydrOXYzine (ATARAX/VISTARIL) 10 MG tablet Take 1 tablet (10 mg total) by mouth 3 (three) times daily as needed. 30 tablet 0  . Insulin Detemir (LEVEMIR FLEXTOUCH) 100 UNIT/ML Pen Inject 10 Units into the skin daily.     . Insulin Pen Needle (FIFTY50 PEN NEEDLES) 31G X 8 MM MISC USE AS DIRECTED    . lidocaine-prilocaine (EMLA) cream Apply 1 application topically as needed. 30 g 0  . Multiple Vitamin (MULTI-VITAMINS) TABS Take 1 tablet by mouth daily.     . Omega-3 1000 MG CAPS Take 1,200 mg by mouth 2 (two) times daily.     . polyethylene glycol (MIRALAX / GLYCOLAX) 17 g packet Take 17 g by mouth daily. 14 each 0  . pravastatin (PRAVACHOL) 40 MG tablet Take 40 mg by mouth daily.     Marland Kitchen senna-docusate (SENOKOT-S) 8.6-50 MG tablet Take 2 tablets by mouth 2 (two) times daily.    . Potassium Chloride ER 20 MEQ TBCR Take 20 mEq by mouth daily for 7 days. 7 tablet 0   No current facility-administered medications for  this visit.   Facility-Administered Medications Ordered in Other Visits  Medication Dose Route Frequency Provider Last Rate Last Admin  . sodium chloride flush (NS) 0.9 % injection 10 mL  10 mL Intracatheter PRN Curt Bears, MD   10 mL at 06/28/18 1501    SURGICAL HISTORY:  Past Surgical History:  Procedure Laterality Date  . IR GUIDED DRAIN W CATHETER PLACEMENT  11/15/2018  . IR GUIDED DRAIN W CATHETER PLACEMENT  01/31/2019  . IR INSTILL VIA CHEST TUBE AGENT FOR FIBRINOLYSIS INI DAY  01/23/2019  . IR REMOVAL OF PLURAL CATH W/CUFF  01/31/2019  . IR  REMOVAL OF PLURAL CATH W/CUFF  05/08/2019  . IR THORACENTESIS ASP PLEURAL SPACE W/IMG GUIDE  09/05/2018  . IR THORACENTESIS ASP PLEURAL SPACE W/IMG GUIDE  10/11/2018  . IR THORACENTESIS ASP PLEURAL SPACE W/IMG GUIDE  10/30/2018  . PORTACATH PLACEMENT Left 02/21/2018   Procedure: INSERTION PORT-A-CATH;  Surgeon: Grace Isaac, MD;  Location: [facility] OR;  Service: Thoracic;  Laterality: Left;    REVIEW OF SYSTEMS:  A comprehensive review of systems was negative except for: Respiratory: positive for dyspnea on exertion   PHYSICAL EXAMINATION: General appearance: alert, cooperative, fatigued and no distress Head: Normocephalic, without obvious abnormality, atraumatic Neck: no adenopathy, no JVD, supple, symmetrical, trachea midline and thyroid not enlarged, symmetric, no tenderness/mass/nodules Lymph nodes: Cervical, supraclavicular, and axillary nodes normal. Resp: clear to auscultation bilaterally Back: symmetric, no curvature. ROM normal. No CVA tenderness. Cardio: regular rate and rhythm, S1, S2 normal, no murmur, click, rub or gallop GI: soft, non-tender; bowel sounds normal; no masses,  no organomegaly Extremities: extremities normal, atraumatic, no cyanosis or edema  ECOG PERFORMANCE STATUS: 1 - Symptomatic but completely ambulatory  Blood pressure (!) 143/86, pulse 89, temperature 98.7 F (37.1 C), temperature source Oral, resp. rate 18, height 5\' 8"  (1.727 m), weight 239 lb 11.2 oz (108.7 kg), SpO2 98 %.  LABORATORY DATA: Lab Results  Component Value Date   WBC 5.4 10/05/2019   HGB 12.4 (L) 10/05/2019   HCT 37.9 (L) 10/05/2019   MCV 89.8 10/05/2019   PLT 190 10/05/2019      Chemistry      Component Value Date/Time   NA 138 10/05/2019 1344   K 3.9 10/05/2019 1344   CL 103 10/05/2019 1344   CO2 25 10/05/2019 1344   BUN 30 (H) 10/05/2019 1344   CREATININE 1.81 (H) 10/05/2019 1344      Component Value Date/Time   CALCIUM 9.3 10/05/2019 1344   ALKPHOS 72 10/05/2019 1344    AST 23 10/05/2019 1344   ALT 18 10/05/2019 1344   BILITOT 0.6 10/05/2019 1344       RADIOGRAPHIC STUDIES: CT Chest Wo Contrast  Result Date: 10/05/2019 CLINICAL DATA:  Non-small-cell lung cancer.  Restaging. EXAM: CT CHEST WITHOUT CONTRAST TECHNIQUE: Multidetector CT imaging of the chest was performed following the standard protocol without IV contrast. COMPARISON:  07/04/2019 FINDINGS: Cardiovascular: The heart size is normal. No substantial pericardial effusion. Coronary artery calcification is evident. Atherosclerotic calcification is noted in the wall of the thoracic aorta. Ascending thoracic aorta measures 4.2 cm diameter. Left-sided Port-A-Cath tip is positioned at the SVC/RA junction. Mediastinum/Nodes: No mediastinal lymphadenopathy. No evidence for gross hilar lymphadenopathy although assessment is limited by the lack of intravenous contrast on today's study. The esophagus has normal imaging features. There is no axillary lymphadenopathy. Lungs/Pleura: Post radiation scarring noted in the parahilar right lung. Volume loss right hemithorax again noted with chronic small loculated  posterior right pleural fluid collection towards the apex. A separate collection of right pleural fluid, likely loculated posteriorly towards the base demonstrates slight increase in associated pleural gas. Associated patchy ground-glass attenuation in the posterior right lung base has decreased slightly in the interval. Tiny perifissural nodule in the left lung is stable. No left pleural effusion. Upper Abdomen: The liver shows diffusely decreased attenuation suggesting fat deposition. Similar appearance 10 mm low-density lesion posterior interpolar left kidney. Musculoskeletal: No worrisome lytic or sclerotic osseous abnormality. IMPRESSION: 1. Stable loculated posterior right pleural fluid collection with some non dependent gas, slightly progressed in the interval. Patient has a remote history of right pleural drain.  Gas in the pleural space today could be related to air leak or infection. 2. Similar appearance of post treatment scarring in the parahilar right lung with loculated small right pleural fluid collection towards the apex. 3. Patchy airspace disease seen posterior right lung base on the previous study has decreased in the interval. 4. Ascending thoracic aorta measures 4.2 cm diameter, stable. Continued attention on follow-up imaging recommended. 5. Hepatic steatosis. 6.  Aortic Atherosclerois (ICD10-170.0) Electronically Signed   By: Misty Stanley M.D.   On: 10/05/2019 16:24    ASSESSMENT AND PLAN: This is a very pleasant 74 years old white male with a stage IIb/IIIa non-small cell lung cancer, squamous cell carcinoma diagnosed in July 2019. The patient is currently undergoing a course of concurrent chemoradiation with weekly carboplatin and paclitaxel status post 5 cycles.  He had partial response after the initial induction treatment. The patient was started on treatment with consolidation Imfinzi status post 18 cycles.  The patient has been tolerating his treatment well except for the skin rash and itching.  He reaches the point that he could not sleep from the itching. I had a lengthy discussion with the patient and his wife today about his condition.  He completed 18 cycles of this treatment.  The patient has a lot of other comorbidities especially the recent diagnosis with congestive heart failure. The patient is currently on observation and he is feeling fine today with no concerning complaints. He had repeat CT scan of the chest performed recently.  I personally and independently reviewed the scans and discussed the results with the patient and his wife today. His a scan showed no concerning findings for disease progression. I recommended for him to continue on observation with repeat CT scan of the chest without contrast in 3 months. For the insufficiency he is followed by his primary care  physician. For the congestive heart failure he has aggressive diuresis and he will continue his current treatment by cardiology. The patient was advised to call immediately if he has any concerning symptoms in the interval. The patient voices understanding of current disease status and treatment options and is in agreement with the current care plan. All questions were answered. The patient knows to call the clinic with any problems, questions or concerns. We can certainly see the patient much sooner if necessary.  Disclaimer: This note was dictated with voice recognition software. Similar sounding words can inadvertently be transcribed and may not be corrected upon review.

## 2019-10-09 ENCOUNTER — Telehealth: Payer: Self-pay | Admitting: Internal Medicine

## 2019-10-09 NOTE — Telephone Encounter (Signed)
Scheduled per los. Called and spoke with patient. Confirmed appt 

## 2019-11-13 ENCOUNTER — Other Ambulatory Visit: Payer: Self-pay | Admitting: Physician Assistant

## 2020-01-04 ENCOUNTER — Other Ambulatory Visit: Payer: Self-pay

## 2020-01-04 ENCOUNTER — Inpatient Hospital Stay: Payer: Medicare Other | Attending: Internal Medicine

## 2020-01-04 ENCOUNTER — Ambulatory Visit (HOSPITAL_COMMUNITY)
Admission: RE | Admit: 2020-01-04 | Discharge: 2020-01-04 | Disposition: A | Discharge status: Home / Self Care | Payer: Medicare Other | Source: Ambulatory Visit | Attending: Internal Medicine | Admitting: Internal Medicine

## 2020-01-04 DIAGNOSIS — Z8546 Personal history of malignant neoplasm of prostate: Secondary | ICD-10-CM | POA: Diagnosis not present

## 2020-01-04 DIAGNOSIS — E119 Type 2 diabetes mellitus without complications: Secondary | ICD-10-CM | POA: Insufficient documentation

## 2020-01-04 DIAGNOSIS — C3411 Malignant neoplasm of upper lobe, right bronchus or lung: Secondary | ICD-10-CM | POA: Diagnosis not present

## 2020-01-04 DIAGNOSIS — I129 Hypertensive chronic kidney disease with stage 1 through stage 4 chronic kidney disease, or unspecified chronic kidney disease: Secondary | ICD-10-CM | POA: Diagnosis not present

## 2020-01-04 DIAGNOSIS — M8580 Other specified disorders of bone density and structure, unspecified site: Secondary | ICD-10-CM | POA: Diagnosis not present

## 2020-01-04 DIAGNOSIS — I700 Atherosclerosis of aorta: Secondary | ICD-10-CM | POA: Diagnosis not present

## 2020-01-04 DIAGNOSIS — R21 Rash and other nonspecific skin eruption: Secondary | ICD-10-CM | POA: Diagnosis not present

## 2020-01-04 DIAGNOSIS — I712 Thoracic aortic aneurysm, without rupture: Secondary | ICD-10-CM | POA: Insufficient documentation

## 2020-01-04 DIAGNOSIS — Z794 Long term (current) use of insulin: Secondary | ICD-10-CM | POA: Insufficient documentation

## 2020-01-04 DIAGNOSIS — I2510 Atherosclerotic heart disease of native coronary artery without angina pectoris: Secondary | ICD-10-CM | POA: Insufficient documentation

## 2020-01-04 DIAGNOSIS — N189 Chronic kidney disease, unspecified: Secondary | ICD-10-CM | POA: Diagnosis not present

## 2020-01-04 DIAGNOSIS — Z9221 Personal history of antineoplastic chemotherapy: Secondary | ICD-10-CM | POA: Diagnosis not present

## 2020-01-04 DIAGNOSIS — Z923 Personal history of irradiation: Secondary | ICD-10-CM | POA: Insufficient documentation

## 2020-01-04 DIAGNOSIS — Z79899 Other long term (current) drug therapy: Secondary | ICD-10-CM | POA: Diagnosis not present

## 2020-01-04 DIAGNOSIS — C3490 Malignant neoplasm of unspecified part of unspecified bronchus or lung: Secondary | ICD-10-CM | POA: Diagnosis not present

## 2020-01-04 DIAGNOSIS — E785 Hyperlipidemia, unspecified: Secondary | ICD-10-CM | POA: Diagnosis not present

## 2020-01-04 LAB — CBC WITH DIFFERENTIAL (CANCER CENTER ONLY)
Abs Immature Granulocytes: 0.02 10*3/uL (ref 0.00–0.07)
Basophils Absolute: 0 10*3/uL (ref 0.0–0.1)
Basophils Relative: 1 %
Eosinophils Absolute: 0.2 10*3/uL (ref 0.0–0.5)
Eosinophils Relative: 6 %
HCT: 34.2 % — ABNORMAL LOW (ref 39.0–52.0)
Hemoglobin: 11.4 g/dL — ABNORMAL LOW (ref 13.0–17.0)
Immature Granulocytes: 1 %
Lymphocytes Relative: 17 %
Lymphs Abs: 0.6 10*3/uL — ABNORMAL LOW (ref 0.7–4.0)
MCH: 30.7 pg (ref 26.0–34.0)
MCHC: 33.3 g/dL (ref 30.0–36.0)
MCV: 92.2 fL (ref 80.0–100.0)
Monocytes Absolute: 0.4 10*3/uL (ref 0.1–1.0)
Monocytes Relative: 11 %
Neutro Abs: 2.2 10*3/uL (ref 1.7–7.7)
Neutrophils Relative %: 64 %
Platelet Count: 193 10*3/uL (ref 150–400)
RBC: 3.71 MIL/uL — ABNORMAL LOW (ref 4.22–5.81)
RDW: 13.8 % (ref 11.5–15.5)
WBC Count: 3.5 10*3/uL — ABNORMAL LOW (ref 4.0–10.5)
nRBC: 0 % (ref 0.0–0.2)

## 2020-01-04 LAB — CMP (CANCER CENTER ONLY)
ALT: 16 U/L (ref 0–44)
AST: 17 U/L (ref 15–41)
Albumin: 3.6 g/dL (ref 3.5–5.0)
Alkaline Phosphatase: 56 U/L (ref 38–126)
Anion gap: 11 (ref 5–15)
BUN: 36 mg/dL — ABNORMAL HIGH (ref 8–23)
CO2: 28 mmol/L (ref 22–32)
Calcium: 9.3 mg/dL (ref 8.9–10.3)
Chloride: 98 mmol/L (ref 98–111)
Creatinine: 2.03 mg/dL — ABNORMAL HIGH (ref 0.61–1.24)
GFR, Est AFR Am: 37 mL/min — ABNORMAL LOW (ref >60)
GFR, Estimated: 32 mL/min — ABNORMAL LOW (ref >60)
Glucose, Bld: 258 mg/dL — ABNORMAL HIGH (ref 70–99)
Potassium: 3.4 mmol/L — ABNORMAL LOW (ref 3.5–5.1)
Sodium: 137 mmol/L (ref 135–145)
Total Bilirubin: 0.7 mg/dL (ref 0.3–1.2)
Total Protein: 7 g/dL (ref 6.5–8.1)

## 2020-01-07 ENCOUNTER — Inpatient Hospital Stay: Payer: Medicare Other | Admitting: Internal Medicine

## 2020-01-07 ENCOUNTER — Telehealth: Payer: Self-pay | Admitting: Internal Medicine

## 2020-01-07 ENCOUNTER — Other Ambulatory Visit: Payer: Self-pay

## 2020-01-07 ENCOUNTER — Encounter: Payer: Self-pay | Admitting: Internal Medicine

## 2020-01-07 VITALS — BP 102/67 | HR 78 | Temp 97.6°F | Resp 18 | Ht 68.0 in | Wt 245.4 lb

## 2020-01-07 DIAGNOSIS — C3411 Malignant neoplasm of upper lobe, right bronchus or lung: Secondary | ICD-10-CM

## 2020-01-07 DIAGNOSIS — C3490 Malignant neoplasm of unspecified part of unspecified bronchus or lung: Secondary | ICD-10-CM

## 2020-01-07 NOTE — Progress Notes (Signed)
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 First dose 02/27/2018.Status post 5 cycles. 2) Consolidationimmunotherapy with Imfinzi 10 mg/kg every 2 weeks.First dose given on 05/17/2018.Status post 18 cycles discontinued secondary to intolerance.  CURRENT THERAPY:Observation.  INTERVAL HISTORY: Darrell [name] 74 y.o. male returns to the clinic today for follow-up visit.  The patient is feeling fine today with no concerning complaints except for fatigue and shortness of breath with exertion.  He gained around 5 pounds since his last visit.  He denied having any chest pain, cough or hemoptysis.  He denied having any fever or chills.  He has no nausea, vomiting, diarrhea or constipation.  He denied having any headache or visual changes.  He has no recent weight loss or night sweats.  The patient had repeat CT scan of the chest performed recently and he is here for evaluation and discussion of his scan results.  MEDICAL HISTORY: Past Medical History:  Diagnosis Date  . Aortic atherosclerosis ([place]) 07/18/2018  . Cellulitis of right lower extremity   . Chronic kidney disease   . Diabetes mellitus without complication ([place])   . Dyslipidemia   . Hypertension   . Prostate CA ([place]) dx'd 2012  . rt lung ca dx'd 01/2018  . Thoracic aortic aneurysm without rupture ([place]) 07/18/2018    ALLERGIES:  is allergic to percocet [oxycodone-acetaminophen] and lisinopril.  MEDICATIONS:  Current Outpatient Medications  Medication Sig Dispense Refill  . albuterol (PROVENTIL HFA;VENTOLIN HFA) 108 (90 Base)  MCG/ACT inhaler Inhale 2 puffs into the lungs every 4 (four) hours as needed for wheezing or shortness of breath. 1 Inhaler 5  . fenofibrate 160 MG tablet Take 160 mg by mouth daily.     . furosemide (LASIX) 40 MG tablet Take 1 tablet (40 mg total) by mouth 2 (two) times daily. Take additional 40 mg tablet/day if weight is up more than 3 lbs in a day or 5 lbs in 2 day. 60 tablet 0  . hydrOXYzine (ATARAX/VISTARIL) 10 MG tablet Take 1 tablet (10 mg total) by mouth 3 (three) times daily as needed. 30 tablet 0  . Insulin Detemir (LEVEMIR FLEXTOUCH) 100 UNIT/ML Pen Inject 10 Units into the skin daily.     . Insulin Pen Needle (FIFTY50 PEN NEEDLES) 31G X 8 MM MISC USE AS DIRECTED    . lidocaine-prilocaine (EMLA) cream Apply 1 application topically as needed. 30 g 0  . Multiple Vitamin (MULTI-VITAMINS) TABS Take 1 tablet by mouth daily.     . Omega-3 1000 MG CAPS Take 1,200 mg by mouth 2 (two) times daily.     . polyethylene glycol (MIRALAX / GLYCOLAX) 17 g packet Take 17 g by mouth daily. 14 each 0  . Potassium Chloride ER 20 MEQ TBCR Take 20 mEq by mouth daily for 7 days. 7 tablet 0  . pravastatin (PRAVACHOL) 40 MG tablet Take 40 mg by mouth daily.     Marland Kitchen senna-docusate (SENOKOT-S) 8.6-50 MG tablet Take 2 tablets by mouth 2 (two) times daily.     No current facility-administered medications for this visit.   Facility-Administered Medications Ordered in Other  Visits  Medication Dose Route Frequency Provider Last Rate Last Admin  . sodium chloride flush (NS) 0.9 % injection 10 mL  10 mL Intracatheter PRN Curt Bears, MD   10 mL at 06/28/18 1501    SURGICAL HISTORY:  Past Surgical History:  Procedure Laterality Date  . IR GUIDED DRAIN W CATHETER PLACEMENT  11/15/2018  . IR GUIDED DRAIN W CATHETER PLACEMENT  01/31/2019  . IR INSTILL VIA CHEST TUBE AGENT FOR FIBRINOLYSIS INI DAY  01/23/2019  . IR REMOVAL OF PLURAL CATH W/CUFF  01/31/2019  . IR REMOVAL OF PLURAL CATH W/CUFF  05/08/2019  . IR  THORACENTESIS ASP PLEURAL SPACE W/IMG GUIDE  09/05/2018  . IR THORACENTESIS ASP PLEURAL SPACE W/IMG GUIDE  10/11/2018  . IR THORACENTESIS ASP PLEURAL SPACE W/IMG GUIDE  10/30/2018  . PORTACATH PLACEMENT Left 02/21/2018   Procedure: INSERTION PORT-A-CATH;  Surgeon: Grace Isaac, MD;  Location: [facility] OR;  Service: Thoracic;  Laterality: Left;    REVIEW OF SYSTEMS:  A comprehensive review of systems was negative except for: Constitutional: positive for fatigue Respiratory: positive for dyspnea on exertion   PHYSICAL EXAMINATION: General appearance: alert, cooperative, fatigued and no distress Head: Normocephalic, without obvious abnormality, atraumatic Neck: no adenopathy, no JVD, supple, symmetrical, trachea midline and thyroid not enlarged, symmetric, no tenderness/mass/nodules Lymph nodes: Cervical, supraclavicular, and axillary nodes normal. Resp: clear to auscultation bilaterally Back: symmetric, no curvature. ROM normal. No CVA tenderness. Cardio: regular rate and rhythm, S1, S2 normal, no murmur, click, rub or gallop GI: soft, non-tender; bowel sounds normal; no masses,  no organomegaly Extremities: extremities normal, atraumatic, no cyanosis or edema  ECOG PERFORMANCE STATUS: 1 - Symptomatic but completely ambulatory  Blood pressure 102/67, pulse 78, temperature 97.6 F (36.4 C), temperature source Temporal, resp. rate 18, height 5\' 8"  (1.727 m), weight 245 lb 6.4 oz (111.3 kg), SpO2 97 %.  LABORATORY DATA: Lab Results  Component Value Date   WBC 3.5 (L) 01/04/2020   HGB 11.4 (L) 01/04/2020   HCT 34.2 (L) 01/04/2020   MCV 92.2 01/04/2020   PLT 193 01/04/2020      Chemistry      Component Value Date/Time   NA 137 01/04/2020 0856   K 3.4 (L) 01/04/2020 0856   CL 98 01/04/2020 0856   CO2 28 01/04/2020 0856   BUN 36 (H) 01/04/2020 0856   CREATININE 2.03 (H) 01/04/2020 0856      Component Value Date/Time   CALCIUM 9.3 01/04/2020 0856   ALKPHOS 56 01/04/2020 0856   AST 17  01/04/2020 0856   ALT 16 01/04/2020 0856   BILITOT 0.7 01/04/2020 0856       RADIOGRAPHIC STUDIES: CT Chest Wo Contrast  Result Date: 01/04/2020 CLINICAL DATA:  Primary Cancer Type: Lung Imaging Indication: Routine surveillance Interval therapy since last imaging? No Initial Cancer Diagnosis Date: 01/31/2018; Established by: Biopsy-proven Detailed Pathology: Stage IIb/IIIa non-small cell lung cancer, squamous cell carcinoma. Primary Tumor location: Right hilum. Chemotherapy: Yes; Ongoing?  No; Most recent administration: 04/2018 Immunotherapy?  Yes; Type: Imfinziz; Ongoing? No Radiation therapy? Yes; Date Range: 02/27/2018-04/13/2018; Target: Right lung EXAM: CT CHEST WITHOUT CONTRAST TECHNIQUE: Multidetector CT imaging of the chest was performed following the standard protocol without IV contrast. COMPARISON:  Most recent CT chest 10/05/2019.  10/13/2018 PET-CT. FINDINGS: Cardiovascular: Normal heart size. Aortic atherosclerosis. Unchanged mild aneurysmal dilatation of the ascending thoracic aorta measuring 4.2 cm, image 61/2. No pericardial effusion. Three vessel coronary artery calcifications. Mediastinum/Nodes: Normal appearance of the thyroid gland.  The trachea appears patent and is midline. Normal appearance of the esophagus. No supraclavicular, axillary, or hilar adenopathy. Hilar structures are suboptimally evaluated due to lack of IV contrast. Lungs/Pleura: Small loculated right pleural effusion overlies the posterior and lateral right lung base. A second, separate area of loculated pleural fluid overlies the posterior and medial right apex. Within the right lung base there is mild asymmetric ground-glass attenuation and subpleural nodularity which is stable from the previous exam and favored to represent postinflammatory changes, image 71/7 and image 82/7. Perihilar and paramediastinal fibrosis and masslike architectural distortion is noted in the right lung compatible with changes due to  external beam radiation. There is associated asymmetric volume loss of the right hemithorax. 4 mm left upper lobe ground-glass nodule is unchanged, image 62/7. Upper Abdomen: No acute abnormality. Musculoskeletal: The bones appear diffusely osteopenic. No acute or suspicious osseous findings. Chronic deformity of the proximal left humerus is again identified. IMPRESSION: 1. Stable appearance of extensive post treatment change within the right lung. No specific findings identified to suggest residual/recurrent tumor or metastatic disease. 2. Chronic areas of loculated pleural fluid are again noted within the right hemithorax. Unchanged in volume. Previously noted gas within the more inferior loculated fluid collection is decreased in the interval. 3. Aortic atherosclerosis and 3 vessel coronary artery calcifications. 4. 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 Recommend annual imaging followup by CTA or MRA. This recommendation follows 2010 ACCF/AHA/AATS/ACR/ASA/SCA/SCAI/SIR/STS/SVM Guidelines for the Diagnosis and Management of Patients with Thoracic Aortic Disease. Circulation. 2010; 121: P794-I016. Aortic aneurysm NOS (ICD10-I71.9) 5. Aortic atherosclerosis. Aortic Atherosclerosis (ICD10-I70.0). Electronically Signed   By: Kerby Moors M.D.   On: 01/04/2020 10:28    ASSESSMENT AND PLAN: This is a very pleasant 74 years old white male with a stage IIb/IIIa non-small cell lung cancer, squamous cell carcinoma diagnosed in July 2019. The patient is currently undergoing a course of concurrent chemoradiation with weekly carboplatin and paclitaxel status post 5 cycles.  He had partial response after the initial induction treatment. The patient was started on treatment with consolidation Imfinzi status post 18 cycles.  The patient has been tolerating his treatment well except for the skin rash and itching.  He reaches the point that he could not sleep from the  itching. I had a lengthy discussion with the patient and his wife today about his condition.  He completed 18 cycles of this treatment.  The patient has a lot of other comorbidities especially the recent diagnosis with congestive heart failure. The patient has been in observation since that time and he is feeling fine. He had repeat CT scan of the chest performed recently.  I personally and independently reviewed the scan with the patient and his wife. His scan showed no concerning findings for disease progression. I recommended for him to continue on observation with repeat CT scan of the chest in 4 months. For the coronary atherosclerosis, he was advised to discuss with his primary care physician any need for adjustment of his medication or evaluation by cardiology. For the renal insufficiency he is followed by his primary care physician. For the congestive heart failure he has aggressive diuresis and he will continue his current treatment by cardiology. The patient was advised to call immediately if he has any concerning symptoms in the interval. The patient voices understanding of current disease status and treatment options and is in agreement with the current care plan. All questions were answered. The patient knows to call  the clinic with any problems, questions or concerns. We can certainly see the patient much sooner if necessary.  Disclaimer: This note was dictated with voice recognition software. Similar sounding words can inadvertently be transcribed and may not be corrected upon review.

## 2020-01-07 NOTE — Telephone Encounter (Signed)
Scheduled per 6/28 los. Printed avs and calendar for pt.

## 2020-05-06 ENCOUNTER — Inpatient Hospital Stay: Payer: Medicare Other | Attending: Internal Medicine

## 2020-05-06 ENCOUNTER — Encounter (HOSPITAL_COMMUNITY): Payer: Self-pay

## 2020-05-06 ENCOUNTER — Other Ambulatory Visit: Payer: Self-pay

## 2020-05-06 ENCOUNTER — Ambulatory Visit (HOSPITAL_COMMUNITY)
Admission: RE | Admit: 2020-05-06 | Discharge: 2020-05-06 | Disposition: A | Discharge status: Home / Self Care | Payer: Medicare Other | Source: Ambulatory Visit | Attending: Internal Medicine | Admitting: Internal Medicine

## 2020-05-06 DIAGNOSIS — C3490 Malignant neoplasm of unspecified part of unspecified bronchus or lung: Secondary | ICD-10-CM | POA: Diagnosis not present

## 2020-05-06 DIAGNOSIS — R21 Rash and other nonspecific skin eruption: Secondary | ICD-10-CM | POA: Diagnosis not present

## 2020-05-06 DIAGNOSIS — Z79899 Other long term (current) drug therapy: Secondary | ICD-10-CM | POA: Insufficient documentation

## 2020-05-06 DIAGNOSIS — I10 Essential (primary) hypertension: Secondary | ICD-10-CM | POA: Insufficient documentation

## 2020-05-06 DIAGNOSIS — R0609 Other forms of dyspnea: Secondary | ICD-10-CM | POA: Diagnosis not present

## 2020-05-06 DIAGNOSIS — R5383 Other fatigue: Secondary | ICD-10-CM | POA: Diagnosis not present

## 2020-05-06 DIAGNOSIS — Z8546 Personal history of malignant neoplasm of prostate: Secondary | ICD-10-CM | POA: Insufficient documentation

## 2020-05-06 DIAGNOSIS — I700 Atherosclerosis of aorta: Secondary | ICD-10-CM | POA: Diagnosis not present

## 2020-05-06 DIAGNOSIS — I2510 Atherosclerotic heart disease of native coronary artery without angina pectoris: Secondary | ICD-10-CM | POA: Diagnosis not present

## 2020-05-06 DIAGNOSIS — J90 Pleural effusion, not elsewhere classified: Secondary | ICD-10-CM | POA: Diagnosis not present

## 2020-05-06 DIAGNOSIS — Z888 Allergy status to other drugs, medicaments and biological substances status: Secondary | ICD-10-CM | POA: Diagnosis not present

## 2020-05-06 DIAGNOSIS — C3411 Malignant neoplasm of upper lobe, right bronchus or lung: Secondary | ICD-10-CM | POA: Diagnosis not present

## 2020-05-06 DIAGNOSIS — Z885 Allergy status to narcotic agent status: Secondary | ICD-10-CM | POA: Insufficient documentation

## 2020-05-06 LAB — CMP (CANCER CENTER ONLY)
ALT: 32 U/L (ref 0–44)
AST: 37 U/L (ref 15–41)
Albumin: 3.6 g/dL (ref 3.5–5.0)
Alkaline Phosphatase: 53 U/L (ref 38–126)
Anion gap: 14 (ref 5–15)
BUN: 60 mg/dL — ABNORMAL HIGH (ref 8–23)
CO2: 26 mmol/L (ref 22–32)
Calcium: 9.8 mg/dL (ref 8.9–10.3)
Chloride: 96 mmol/L — ABNORMAL LOW (ref 98–111)
Creatinine: 2.27 mg/dL — ABNORMAL HIGH (ref 0.61–1.24)
GFR, Estimated: 30 mL/min — ABNORMAL LOW (ref >60)
Glucose, Bld: 127 mg/dL — ABNORMAL HIGH (ref 70–99)
Potassium: 3.5 mmol/L (ref 3.5–5.1)
Sodium: 136 mmol/L (ref 135–145)
Total Bilirubin: 1.1 mg/dL (ref 0.3–1.2)
Total Protein: 7.2 g/dL (ref 6.5–8.1)

## 2020-05-06 LAB — CBC WITH DIFFERENTIAL (CANCER CENTER ONLY)
Abs Immature Granulocytes: 0.02 10*3/uL (ref 0.00–0.07)
Basophils Absolute: 0.1 10*3/uL (ref 0.0–0.1)
Basophils Relative: 1 %
Eosinophils Absolute: 0.1 10*3/uL (ref 0.0–0.5)
Eosinophils Relative: 2 %
HCT: 36.6 % — ABNORMAL LOW (ref 39.0–52.0)
Hemoglobin: 12.3 g/dL — ABNORMAL LOW (ref 13.0–17.0)
Immature Granulocytes: 0 %
Lymphocytes Relative: 11 %
Lymphs Abs: 0.6 10*3/uL — ABNORMAL LOW (ref 0.7–4.0)
MCH: 32.1 pg (ref 26.0–34.0)
MCHC: 33.6 g/dL (ref 30.0–36.0)
MCV: 95.6 fL (ref 80.0–100.0)
Monocytes Absolute: 0.4 10*3/uL (ref 0.1–1.0)
Monocytes Relative: 8 %
Neutro Abs: 4.1 10*3/uL (ref 1.7–7.7)
Neutrophils Relative %: 78 %
Platelet Count: 191 10*3/uL (ref 150–400)
RBC: 3.83 MIL/uL — ABNORMAL LOW (ref 4.22–5.81)
RDW: 14.6 % (ref 11.5–15.5)
WBC Count: 5.2 10*3/uL (ref 4.0–10.5)
nRBC: 0 % (ref 0.0–0.2)

## 2020-05-08 ENCOUNTER — Encounter: Payer: Self-pay | Admitting: Internal Medicine

## 2020-05-08 ENCOUNTER — Inpatient Hospital Stay: Payer: Medicare Other | Admitting: Internal Medicine

## 2020-05-08 ENCOUNTER — Other Ambulatory Visit: Payer: Self-pay

## 2020-05-08 VITALS — BP 121/68 | HR 75 | Temp 97.1°F | Resp 18 | Wt 221.9 lb

## 2020-05-08 DIAGNOSIS — C3411 Malignant neoplasm of upper lobe, right bronchus or lung: Secondary | ICD-10-CM

## 2020-05-08 DIAGNOSIS — Z95828 Presence of other vascular implants and grafts: Secondary | ICD-10-CM | POA: Diagnosis not present

## 2020-05-08 DIAGNOSIS — C3490 Malignant neoplasm of unspecified part of unspecified bronchus or lung: Secondary | ICD-10-CM | POA: Diagnosis not present

## 2020-05-08 MED ORDER — HEPARIN SOD (PORK) LOCK FLUSH 100 UNIT/ML IV SOLN
500.0000 [IU] | Freq: Once | INTRAVENOUS | Status: AC | PRN
  Administered 2020-05-08: 500 [IU]
  Filled 2020-05-08: qty 5

## 2020-05-08 MED ORDER — ALTEPLASE 2 MG IJ SOLR
INTRAMUSCULAR | Status: AC
  Filled 2020-05-08: qty 2

## 2020-05-08 MED ORDER — ALTEPLASE 2 MG IJ SOLR
2.0000 mg | Freq: Once | INTRAMUSCULAR | Status: AC | PRN
  Administered 2020-05-08: 2 mg
  Filled 2020-05-08: qty 2

## 2020-05-08 MED ORDER — SODIUM CHLORIDE 0.9% FLUSH
10.0000 mL | INTRAVENOUS | Status: DC | PRN
  Administered 2020-05-08: 10 mL
  Filled 2020-05-08: qty 10

## 2020-05-08 NOTE — Addendum Note (Signed)
Addended by: Ardeen Garland on: 05/08/2020 10:04 AM   Modules accepted: Orders

## 2020-05-08 NOTE — Progress Notes (Signed)
[place] Telephone:(336) [phone]   Fax:(336) [phone]  OFFICE PROGRESS NOTE  [name], MD No address on file  DIAGNOSIS: Stage IIb/IIIa (T2b, N0/N2, M0), non-small cell lung cancer, squamous cell carcinoma diagnosed in July 2019 and presented with large right hilar mass with questionable mediastinal invasion  PRIOR THERAPY:  1) Concurrent chemoradiation with weekly carboplatin for AUC of 2 and paclitaxel 45 mg/M2. First dose 02/27/2018.Status post 5 cycles. 2) Consolidationimmunotherapy with Imfinzi 10 mg/kg every 2 weeks.First dose given on 05/17/2018.Status post 18 cycles discontinued secondary to intolerance.  CURRENT THERAPY:Observation.  INTERVAL HISTORY: Darrell [name] 74 y.o. male returns to the clinic today for follow-up visit.  His wife Darrell [name] was available by phone during the visit.  The patient is feeling fine with no concerning complaints.  He intentionally lost around 20 pounds in the last few months.  He exercises at regular basis.  He denied having any chest pain but has shortness of breath with exertion with no cough or hemoptysis.  He denied having any fever or chills.  He has no nausea, vomiting, diarrhea or constipation.  He has no headache or visual changes.  He is here today for evaluation with repeat CT scan of the chest for restaging of his disease.   MEDICAL HISTORY: Past Medical History:  Diagnosis Date  . Aortic atherosclerosis ([place]) 07/18/2018  . Cellulitis of right lower extremity   . Chronic kidney disease   . Diabetes mellitus without complication ([place])   . Dyslipidemia   . Hypertension   . Prostate CA ([place]) dx'd 2012  . rt lung ca dx'd 01/2018  . Thoracic aortic aneurysm without rupture ([place]) 07/18/2018    ALLERGIES:  is allergic to percocet [oxycodone-acetaminophen] and lisinopril.  MEDICATIONS:  Current Outpatient Medications  Medication Sig Dispense Refill  . albuterol (PROVENTIL HFA;VENTOLIN HFA) 108 (90 Base)  MCG/ACT inhaler Inhale 2 puffs into the lungs every 4 (four) hours as needed for wheezing or shortness of breath. 1 Inhaler 5  . fenofibrate 160 MG tablet Take 160 mg by mouth daily.     . furosemide (LASIX) 40 MG tablet Take 1 tablet (40 mg total) by mouth 2 (two) times daily. Take additional 40 mg tablet/day if weight is up more than 3 lbs in a day or 5 lbs in 2 day. 60 tablet 0  . hydrOXYzine (ATARAX/VISTARIL) 10 MG tablet Take 1 tablet (10 mg total) by mouth 3 (three) times daily as needed. 30 tablet 0  . Insulin Detemir (LEVEMIR FLEXTOUCH) 100 UNIT/ML Pen Inject 10 Units into the skin daily.     . Insulin Pen Needle (FIFTY50 PEN NEEDLES) 31G X 8 MM MISC USE AS DIRECTED    . lidocaine-prilocaine (EMLA) cream Apply 1 application topically as needed. 30 g 0  . Multiple Vitamin (MULTI-VITAMINS) TABS Take 1 tablet by mouth daily.     . Omega-3 1000 MG CAPS Take 1,200 mg by mouth 2 (two) times daily.     . polyethylene glycol (MIRALAX / GLYCOLAX) 17 g packet Take 17 g by mouth daily. 14 each 0  . Potassium Chloride ER 20 MEQ TBCR Take 20 mEq by mouth daily for 7 days. 7 tablet 0  . pravastatin (PRAVACHOL) 40 MG tablet Take 40 mg by mouth daily.     Marland Kitchen senna-docusate (SENOKOT-S) 8.6-50 MG tablet Take 2 tablets by mouth 2 (two) times daily.     No current facility-administered medications for this visit.   Facility-Administered Medications  Ordered in Other Visits  Medication Dose Route Frequency Provider Last Rate Last Admin  . sodium chloride flush (NS) 0.9 % injection 10 mL  10 mL Intracatheter PRN Curt Bears, MD   10 mL at 06/28/18 1501    SURGICAL HISTORY:  Past Surgical History:  Procedure Laterality Date  . IR GUIDED DRAIN W CATHETER PLACEMENT  11/15/2018  . IR GUIDED DRAIN W CATHETER PLACEMENT  01/31/2019  . IR INSTILL VIA CHEST TUBE AGENT FOR FIBRINOLYSIS INI DAY  01/23/2019  . IR REMOVAL OF PLURAL CATH W/CUFF  01/31/2019  . IR REMOVAL OF PLURAL CATH W/CUFF  05/08/2019  . IR  THORACENTESIS ASP PLEURAL SPACE W/IMG GUIDE  09/05/2018  . IR THORACENTESIS ASP PLEURAL SPACE W/IMG GUIDE  10/11/2018  . IR THORACENTESIS ASP PLEURAL SPACE W/IMG GUIDE  10/30/2018  . PORTACATH PLACEMENT Left 02/21/2018   Procedure: INSERTION PORT-A-CATH;  Surgeon: Grace Isaac, MD;  Location: [facility] OR;  Service: Thoracic;  Laterality: Left;    REVIEW OF SYSTEMS:  A comprehensive review of systems was negative except for: Constitutional: positive for fatigue Respiratory: positive for dyspnea on exertion   PHYSICAL EXAMINATION: General appearance: alert, cooperative, fatigued and no distress Head: Normocephalic, without obvious abnormality, atraumatic Neck: no adenopathy, no JVD, supple, symmetrical, trachea midline and thyroid not enlarged, symmetric, no tenderness/mass/nodules Lymph nodes: Cervical, supraclavicular, and axillary nodes normal. Resp: clear to auscultation bilaterally Back: symmetric, no curvature. ROM normal. No CVA tenderness. Cardio: regular rate and rhythm, S1, S2 normal, no murmur, click, rub or gallop GI: soft, non-tender; bowel sounds normal; no masses,  no organomegaly Extremities: extremities normal, atraumatic, no cyanosis or edema  ECOG PERFORMANCE STATUS: 1 - Symptomatic but completely ambulatory  Blood pressure 121/68, pulse 75, temperature (!) 97.1 F (36.2 C), temperature source Tympanic, resp. rate 18, weight 221 lb 14.4 oz (100.7 kg), SpO2 99 %.  LABORATORY DATA: Lab Results  Component Value Date   WBC 5.2 05/06/2020   HGB 12.3 (L) 05/06/2020   HCT 36.6 (L) 05/06/2020   MCV 95.6 05/06/2020   PLT 191 05/06/2020      Chemistry      Component Value Date/Time   NA 136 05/06/2020 0840   K 3.5 05/06/2020 0840   CL 96 (L) 05/06/2020 0840   CO2 26 05/06/2020 0840   BUN 60 (H) 05/06/2020 0840   CREATININE 2.27 (H) 05/06/2020 0840      Component Value Date/Time   CALCIUM 9.8 05/06/2020 0840   ALKPHOS 53 05/06/2020 0840   AST 37 05/06/2020 0840   ALT  32 05/06/2020 0840   BILITOT 1.1 05/06/2020 0840       RADIOGRAPHIC STUDIES: CT Chest Wo Contrast  Result Date: 05/06/2020 CLINICAL DATA:  Primary Cancer Type: Lung Imaging Indication: Routine surveillance Interval therapy since last imaging? No Initial Cancer Diagnosis Date: 01/31/2018; Established by: Biopsy-proven Detailed Pathology: Stage IIb/IIIa non-small cell lung cancer, squamous cell carcinoma. Primary Tumor location:  Right hilum. Surgeries: No. Chemotherapy: Yes; Ongoing?  No; Most recent administration: 04/2018 Immunotherapy?  Yes; Type: Imfinzi; Ongoing? No Radiation therapy? Yes; Date Range: 02/27/2018-04/13/2018; Target: Right lung Other Cancers: Prostate cancer 2012. EXAM: CT CHEST WITHOUT CONTRAST TECHNIQUE: Multidetector CT imaging of the chest was performed following the standard protocol without IV contrast. COMPARISON:  Most recent CT chest 01/04/2020.  10/13/2018 PET-CT. FINDINGS: Cardiovascular: Left chest port catheter. Aortic atherosclerosis. Unchanged enlargement of the tubular ascending thoracic aorta, measuring up to 4.3 x 4.2 cm. Normal heart size. Three-vessel coronary artery calcifications.  No pericardial effusion. Mediastinum/Nodes: Unchanged post treatment appearance of soft tissue about the right hilum. No discretely enlarged mediastinal or hilar lymph nodes. Thyroid gland, trachea, and esophagus demonstrate no significant findings. Lungs/Pleura: Unchanged post treatment appearance of the perihilar and suprahilar right lung, with dense fibrotic consolidation. Unchanged small, loculated right pleural effusion. Upper Abdomen: No acute abnormality. Musculoskeletal: No chest wall mass or suspicious bone lesions identified. IMPRESSION: 1. Unchanged post treatment appearance of the right hemithorax. No evidence of malignant recurrence. 2.  Unchanged small, loculated right pleural effusion. 3. 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 Attention on follow-up. Recommend annual imaging followup by CTA or MRA. This recommendation follows 2010 ACCF/AHA/AATS/ACR/ASA/SCA/SCAI/SIR/STS/SVM Guidelines for the Diagnosis and Management of Patients with Thoracic Aortic Disease. Circulation. 2010; 121: H606-V703. Aortic aneurysm NOS (ICD10-I71.9) 4.  Coronary artery disease.  Aortic Atherosclerosis (ICD10-I70.0). Electronically Signed   By: Eddie Candle M.D.   On: 05/06/2020 10:42    ASSESSMENT AND PLAN: This is a very pleasant 74 years old white male with a stage IIb/IIIa non-small cell lung cancer, squamous cell carcinoma diagnosed in July 2019. The patient is currently undergoing a course of concurrent chemoradiation with weekly carboplatin and paclitaxel status post 5 cycles.  He had partial response after the initial induction treatment. The patient was started on treatment with consolidation Imfinzi status post 18 cycles.  The patient has been tolerating his treatment well except for the skin rash and itching.  He reaches the point that he could not sleep from the itching. I had a lengthy discussion with the patient and his wife today about his condition.  He completed 18 cycles of this treatment.  The patient has a lot of other comorbidities especially the recent diagnosis with congestive heart failure. He is current on observation and feeling fine with no concerning complaints. He had repeat CT scan of the chest performed recently.  I personally and independently reviewed the scans and discussed the results with the patient and his wife today. His scan showed no concerning findings for disease progression.  I recommended for him to continue on observation with repeat CT scan of the chest in 6 months. I will also arrange for the patient to have Port-A-Cath flush every 2 months. For the coronary atherosclerosis, he was advised to discuss with his primary care physician any need for adjustment of his medication or evaluation by  cardiology. For the renal insufficiency he is followed by his primary care physician. For the congestive heart failure he has aggressive diuresis and he will continue his current treatment by cardiology. He was advised to call immediately if he has any concerning symptoms in the interval. The patient voices understanding of current disease status and treatment options and is in agreement with the current care plan. All questions were answered. The patient knows to call the clinic with any problems, questions or concerns. We can certainly see the patient much sooner if necessary.  Disclaimer: This note was dictated with voice recognition software. Similar sounding words can inadvertently be transcribed and may not be corrected upon review.

## 2020-05-12 ENCOUNTER — Telehealth: Payer: Self-pay | Admitting: Internal Medicine

## 2020-05-12 NOTE — Telephone Encounter (Signed)
Scheduled appt per 10/28 los - mailed letter with appt date and time

## 2020-07-08 ENCOUNTER — Inpatient Hospital Stay: Payer: Medicare Other | Attending: Internal Medicine

## 2020-07-08 ENCOUNTER — Other Ambulatory Visit: Payer: Self-pay

## 2020-07-08 DIAGNOSIS — C3411 Malignant neoplasm of upper lobe, right bronchus or lung: Secondary | ICD-10-CM | POA: Insufficient documentation

## 2020-07-08 DIAGNOSIS — Z95828 Presence of other vascular implants and grafts: Secondary | ICD-10-CM

## 2020-07-08 DIAGNOSIS — Z452 Encounter for adjustment and management of vascular access device: Secondary | ICD-10-CM | POA: Diagnosis not present

## 2020-07-08 MED ORDER — SODIUM CHLORIDE 0.9% FLUSH
10.0000 mL | INTRAVENOUS | Status: DC | PRN
  Administered 2020-07-08: 10 mL
  Filled 2020-07-08: qty 10

## 2020-07-08 MED ORDER — HEPARIN SOD (PORK) LOCK FLUSH 100 UNIT/ML IV SOLN
500.0000 [IU] | Freq: Once | INTRAVENOUS | Status: AC | PRN
Start: 2020-07-08 — End: 2020-07-08
  Administered 2020-07-08: 500 [IU]
  Filled 2020-07-08: qty 5

## 2020-07-08 NOTE — Patient Instructions (Signed)

## 2020-08-13 ENCOUNTER — Other Ambulatory Visit: Payer: Self-pay

## 2020-08-13 ENCOUNTER — Ambulatory Visit: Payer: Self-pay

## 2020-08-13 ENCOUNTER — Ambulatory Visit: Payer: Medicare Other | Admitting: Orthopaedic Surgery

## 2020-08-13 ENCOUNTER — Encounter: Payer: Self-pay | Admitting: Orthopaedic Surgery

## 2020-08-13 VITALS — BP 155/89 | HR 78 | Ht 68.5 in | Wt 223.0 lb

## 2020-08-13 DIAGNOSIS — M25512 Pain in left shoulder: Secondary | ICD-10-CM | POA: Diagnosis not present

## 2020-08-13 DIAGNOSIS — G8929 Other chronic pain: Secondary | ICD-10-CM

## 2020-08-13 NOTE — Progress Notes (Signed)
Office Visit Note   Patient: Darrell [name]           Date of Birth: September 14, 1945           MRN: [id number] Visit Date: 08/13/2020              Requested by: [name], MD No address on file PCP: [name], MD   Assessment & Plan: Visit Diagnoses:  1. Chronic left shoulder pain     Plan: We discussed with the remodeling of the proximal humerus from his comminuted fracture he is getting some impingement of the greater tuberosity against acromium with abduction.  We discussed that despite all efforts he is not can be able to prove his range of motion less he had total shoulder arthroplasty or reverse shoulder arthroplasty.  As long as he avoids efforts at outstretched overhead activities he is not having any problems.  I recommend he just modify his workout and continue with all the exercises he is doing except for those that involve extremes of range of motion on the shoulder that is aggravating his left shoulder symptoms.  He states during the day as long as his hot hand is down low he has no pain with activities of daily living otherwise.  If he has increased symptoms he can return.  Follow-Up Instructions: Return if symptoms worsen or fail to improve.   Orders:  Orders Placed This Encounter  Procedures  . XR Shoulder Left   No orders of the defined types were placed in this encounter.     Procedures: No procedures performed   Clinical Data: No additional findings.   Subjective: Chief Complaint  Patient presents with  . Left Shoulder - Pain    HPI 75 year old male returns he had left proximal humerus fracture 01/30/2019 with decreased range of motion of his shoulder.  He has been treated for squamous cell cancer currently.  Patient did PT on his shoulder on his own for 6 months he is lost 50 pounds and states he has been going to the gym doing weights but notes that he does not have full range of motion of his left shoulder.  He is wondering if some of his  symptoms in his shoulder may be aggravated by the machines.  He has difficulty abducting more than 90 degrees.  When he quit doing some of the machines he states his pain got better.  Review of Systems positive history of stage III kidney disease, COPD, weight loss with exercise and diet.  Previous closed fracture left proximal humerus.   Objective: Vital Signs: BP (!) 155/89   Pulse 78   Ht 5' 8.5" (1.74 m)   Wt 223 lb (101.2 kg)   BMI 33.41 kg/m   Physical Exam Constitutional:      Appearance: He is well-developed and well-nourished.  HENT:     Head: Normocephalic and atraumatic.  Eyes:     Extraocular Movements: EOM normal.     Pupils: Pupils are equal, round, and reactive to light.  Neck:     Thyroid: No thyromegaly.     Trachea: No tracheal deviation.  Cardiovascular:     Rate and Rhythm: Normal rate.  Pulmonary:     Effort: Pulmonary effort is normal.     Breath sounds: No wheezing.  Abdominal:     General: Bowel sounds are normal.     Palpations: Abdomen is soft.  Skin:    General: Skin is warm and dry.  Capillary Refill: Capillary refill takes less than 2 seconds.  Neurological:     Mental Status: He is alert and oriented to person, place, and time.  Psychiatric:        Mood and Affect: Mood and affect normal.        Behavior: Behavior normal.        Thought Content: Thought content normal.        Judgment: Judgment normal.     Ortho Exam patient can flex and abduct to 90 degrees.  Negative drop arm test.  Opposite left arm can go over his head easily he is right-hand dominant.  Station the hand is intact.  Specialty Comments:  No specialty comments available.  Imaging: No results found.   PMFS History: Patient Active Problem List   Diagnosis Date Noted  . Anemia 03/07/2019  . Acute upper GI bleed 03/07/2019  . Pain and swelling of right upper extremity   . Acute diastolic CHF (congestive heart failure) ([place]) 02/22/2019  . Closed fracture of  left proximal humerus 02/02/2019  . Hypokalemia 09/18/2018  . Dyspnea 08/29/2018  . COPD (chronic obstructive pulmonary disease) ([place]) 08/03/2018  . Hemoptysis 07/18/2018  . CKD (chronic kidney disease) stage 3, GFR 30-59 ml/min (H[facility]) 07/18/2018  . Lobar pneumonia ([place]) 07/18/2018  . Thoracic aortic aneurysm without rupture ([place]) 07/18/2018  . Aortic atherosclerosis ([place]) 07/18/2018  . Encounter for antineoplastic immunotherapy 05/17/2018  . Port-A-Cath in place 03/27/2018  . Encounter for antineoplastic chemotherapy 02/16/2018  . 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 Goals of care, counseling/discussion 02/07/2018  . ANGIOKERATOMA, BLEEDING 10/06/2006  . DM2 (diabetes mellitus, type 2) ([place]) 10/06/2006  . HYPERLIPIDEMIA 10/06/2006  . HYPERTENSION, BENIGN 10/06/2006  . LATERAL MENISCUS TEAR, RIGHT 10/06/2006  . CHOLECYSTECTOMY, HX OF 10/06/2006   Past Medical History:  Diagnosis Date  . Aortic atherosclerosis ([place]) 07/18/2018  . Cellulitis of right lower extremity   . Chronic kidney disease   . Diabetes mellitus without complication ([place])   . Dyslipidemia   . Hypertension   . Prostate CA ([place]) dx'd 2012  . rt lung ca dx'd 01/2018  . Thoracic aortic aneurysm without rupture ([place]) 07/18/2018    Family History  Problem Relation Age of Onset  . Breast cancer Mother   . CAD Father     Past Surgical History:  Procedure Laterality Date  . IR GUIDED DRAIN W CATHETER PLACEMENT  11/15/2018  . IR GUIDED DRAIN W CATHETER PLACEMENT  01/31/2019  . IR INSTILL VIA CHEST TUBE AGENT FOR FIBRINOLYSIS INI DAY  01/23/2019  . IR REMOVAL OF PLURAL CATH W/CUFF  01/31/2019  . IR REMOVAL OF PLURAL CATH W/CUFF  05/08/2019  . IR THORACENTESIS ASP PLEURAL SPACE W/IMG GUIDE  09/05/2018  . IR THORACENTESIS ASP PLEURAL SPACE W/IMG GUIDE  10/11/2018  . IR THORACENTESIS ASP PLEURAL SPACE W/IMG GUIDE  10/30/2018  . PORTACATH PLACEMENT Left 02/21/2018   Procedure: INSERTION PORT-A-CATH;  Surgeon: Grace Isaac, MD;  Location: [facility];  Service: Thoracic;  Laterality: Left;   Social History   Occupational History  . Not on file  Tobacco Use  . Smoking status: Former Smoker    Packs/day: 2.00    Years: 35.00    Pack years: 70.00    Types: Cigarettes    Quit date: 07/12/1996    Years since quitting: 24.1  . Smokeless tobacco: Never Used  Vaping Use  . Vaping Use: Never used  Substance and Sexual Activity  . Alcohol  use: Yes    Comment: occasionally  . Drug use: Never  . Sexual activity: Not Currently

## 2020-09-08 ENCOUNTER — Inpatient Hospital Stay: Payer: Medicare Other | Attending: Internal Medicine

## 2020-09-08 ENCOUNTER — Other Ambulatory Visit: Payer: Self-pay

## 2020-09-08 DIAGNOSIS — Z452 Encounter for adjustment and management of vascular access device: Secondary | ICD-10-CM | POA: Insufficient documentation

## 2020-09-08 DIAGNOSIS — Z95828 Presence of other vascular implants and grafts: Secondary | ICD-10-CM

## 2020-09-08 DIAGNOSIS — C3411 Malignant neoplasm of upper lobe, right bronchus or lung: Secondary | ICD-10-CM | POA: Insufficient documentation

## 2020-09-08 MED ORDER — HEPARIN SOD (PORK) LOCK FLUSH 100 UNIT/ML IV SOLN
500.0000 [IU] | Freq: Once | INTRAVENOUS | Status: AC | PRN
  Administered 2020-09-08: 500 [IU]
  Filled 2020-09-08: qty 5

## 2020-09-08 MED ORDER — SODIUM CHLORIDE 0.9% FLUSH
10.0000 mL | INTRAVENOUS | Status: DC | PRN
  Administered 2020-09-08: 10 mL
  Filled 2020-09-08: qty 10

## 2020-09-08 NOTE — Patient Instructions (Signed)

## 2020-11-04 ENCOUNTER — Encounter (HOSPITAL_COMMUNITY): Payer: Self-pay

## 2020-11-04 ENCOUNTER — Ambulatory Visit (HOSPITAL_COMMUNITY)
Admission: RE | Admit: 2020-11-04 | Discharge: 2020-11-04 | Disposition: A | Discharge status: Home / Self Care | Payer: Medicare Other | Source: Ambulatory Visit | Attending: Internal Medicine | Admitting: Internal Medicine

## 2020-11-04 ENCOUNTER — Other Ambulatory Visit: Payer: Self-pay

## 2020-11-04 ENCOUNTER — Telehealth: Payer: Self-pay

## 2020-11-04 DIAGNOSIS — C3490 Malignant neoplasm of unspecified part of unspecified bronchus or lung: Secondary | ICD-10-CM | POA: Diagnosis present

## 2020-11-04 NOTE — Telephone Encounter (Signed)
Spoke with pt as requested to clarify his upcoming appts with [facility]. Pt verbalizes understanding.

## 2020-11-06 ENCOUNTER — Other Ambulatory Visit: Payer: Medicare Other

## 2020-11-06 ENCOUNTER — Ambulatory Visit: Payer: Medicare Other | Admitting: Internal Medicine

## 2020-11-11 ENCOUNTER — Encounter: Payer: Self-pay | Admitting: Internal Medicine

## 2020-11-11 ENCOUNTER — Other Ambulatory Visit: Payer: Self-pay

## 2020-11-11 ENCOUNTER — Encounter: Payer: Self-pay | Admitting: *Deleted

## 2020-11-11 ENCOUNTER — Inpatient Hospital Stay: Payer: Medicare Other

## 2020-11-11 ENCOUNTER — Inpatient Hospital Stay: Payer: Medicare Other | Attending: Internal Medicine | Admitting: Internal Medicine

## 2020-11-11 VITALS — BP 126/63 | HR 74 | Temp 97.0°F | Resp 18 | Ht 68.0 in | Wt 225.7 lb

## 2020-11-11 DIAGNOSIS — Z95828 Presence of other vascular implants and grafts: Secondary | ICD-10-CM

## 2020-11-11 DIAGNOSIS — I129 Hypertensive chronic kidney disease with stage 1 through stage 4 chronic kidney disease, or unspecified chronic kidney disease: Secondary | ICD-10-CM | POA: Insufficient documentation

## 2020-11-11 DIAGNOSIS — C3490 Malignant neoplasm of unspecified part of unspecified bronchus or lung: Secondary | ICD-10-CM | POA: Diagnosis not present

## 2020-11-11 DIAGNOSIS — M19012 Primary osteoarthritis, left shoulder: Secondary | ICD-10-CM | POA: Diagnosis not present

## 2020-11-11 DIAGNOSIS — I509 Heart failure, unspecified: Secondary | ICD-10-CM | POA: Diagnosis not present

## 2020-11-11 DIAGNOSIS — R059 Cough, unspecified: Secondary | ICD-10-CM | POA: Insufficient documentation

## 2020-11-11 DIAGNOSIS — E785 Hyperlipidemia, unspecified: Secondary | ICD-10-CM | POA: Diagnosis not present

## 2020-11-11 DIAGNOSIS — Z8546 Personal history of malignant neoplasm of prostate: Secondary | ICD-10-CM | POA: Insufficient documentation

## 2020-11-11 DIAGNOSIS — E119 Type 2 diabetes mellitus without complications: Secondary | ICD-10-CM | POA: Insufficient documentation

## 2020-11-11 DIAGNOSIS — N189 Chronic kidney disease, unspecified: Secondary | ICD-10-CM | POA: Insufficient documentation

## 2020-11-11 DIAGNOSIS — M19011 Primary osteoarthritis, right shoulder: Secondary | ICD-10-CM | POA: Diagnosis not present

## 2020-11-11 DIAGNOSIS — I712 Thoracic aortic aneurysm, without rupture: Secondary | ICD-10-CM | POA: Diagnosis not present

## 2020-11-11 DIAGNOSIS — Z79899 Other long term (current) drug therapy: Secondary | ICD-10-CM | POA: Insufficient documentation

## 2020-11-11 DIAGNOSIS — I700 Atherosclerosis of aorta: Secondary | ICD-10-CM | POA: Diagnosis not present

## 2020-11-11 DIAGNOSIS — C3411 Malignant neoplasm of upper lobe, right bronchus or lung: Secondary | ICD-10-CM | POA: Diagnosis present

## 2020-11-11 LAB — CBC WITH DIFFERENTIAL (CANCER CENTER ONLY)
Abs Immature Granulocytes: 0.01 10*3/uL (ref 0.00–0.07)
Basophils Absolute: 0.1 10*3/uL (ref 0.0–0.1)
Basophils Relative: 1 %
Eosinophils Absolute: 0.2 10*3/uL (ref 0.0–0.5)
Eosinophils Relative: 4 %
HCT: 40.2 % (ref 39.0–52.0)
Hemoglobin: 13.3 g/dL (ref 13.0–17.0)
Immature Granulocytes: 0 %
Lymphocytes Relative: 16 %
Lymphs Abs: 0.7 10*3/uL (ref 0.7–4.0)
MCH: 29.8 pg (ref 26.0–34.0)
MCHC: 33.1 g/dL (ref 30.0–36.0)
MCV: 89.9 fL (ref 80.0–100.0)
Monocytes Absolute: 0.5 10*3/uL (ref 0.1–1.0)
Monocytes Relative: 11 %
Neutro Abs: 3 10*3/uL (ref 1.7–7.7)
Neutrophils Relative %: 68 %
Platelet Count: 201 10*3/uL (ref 150–400)
RBC: 4.47 MIL/uL (ref 4.22–5.81)
RDW: 14.7 % (ref 11.5–15.5)
WBC Count: 4.5 10*3/uL (ref 4.0–10.5)
nRBC: 0 % (ref 0.0–0.2)

## 2020-11-11 LAB — CMP (CANCER CENTER ONLY)
ALT: 14 U/L (ref 0–44)
AST: 21 U/L (ref 15–41)
Albumin: 4 g/dL (ref 3.5–5.0)
Alkaline Phosphatase: 41 U/L (ref 38–126)
Anion gap: 12 (ref 5–15)
BUN: 56 mg/dL — ABNORMAL HIGH (ref 8–23)
CO2: 28 mmol/L (ref 22–32)
Calcium: 9.6 mg/dL (ref 8.9–10.3)
Chloride: 97 mmol/L — ABNORMAL LOW (ref 98–111)
Creatinine: 2.06 mg/dL — ABNORMAL HIGH (ref 0.61–1.24)
GFR, Estimated: 34 mL/min — ABNORMAL LOW (ref >60)
Glucose, Bld: 175 mg/dL — ABNORMAL HIGH (ref 70–99)
Potassium: 3.7 mmol/L (ref 3.5–5.1)
Sodium: 137 mmol/L (ref 135–145)
Total Bilirubin: 0.8 mg/dL (ref 0.3–1.2)
Total Protein: 7.6 g/dL (ref 6.5–8.1)

## 2020-11-11 MED ORDER — SODIUM CHLORIDE 0.9% FLUSH
10.0000 mL | INTRAVENOUS | Status: DC | PRN
  Administered 2020-11-11: 10 mL
  Filled 2020-11-11: qty 10

## 2020-11-11 MED ORDER — HEPARIN SOD (PORK) LOCK FLUSH 100 UNIT/ML IV SOLN
500.0000 [IU] | Freq: Once | INTRAVENOUS | Status: AC | PRN
  Administered 2020-11-11: 500 [IU]
  Filled 2020-11-11: qty 5

## 2020-11-11 NOTE — Patient Instructions (Signed)
Implanted Port Insertion, Care After This sheet gives you information about how to care for yourself after your procedure. Your health care provider may also give you more specific instructions. If you have problems or questions, contact your health care provider. What can I expect after the procedure? After the procedure, it is common to have:  Discomfort at the port insertion site.  Bruising on the skin over the port. This should improve over 3-4 days. Follow these instructions at home: Port care  After your port is placed, you will get a manufacturer's information card. The card has information about your port. Keep this card with you at all times.  Take care of the port as told by your health care provider. Ask your health care provider if you or a family member can get training for taking care of the port at home. A home health care nurse may also take care of the port.  Make sure to remember what type of port you have. Incision care  Follow instructions from your health care provider about how to take care of your port insertion site. Make sure you: ? Wash your hands with soap and water before and after you change your bandage (dressing). If soap and water are not available, use hand sanitizer. ? Change your dressing as told by your health care provider. ? Leave stitches (sutures), skin glue, or adhesive strips in place. These skin closures may need to stay in place for 2 weeks or longer. If adhesive strip edges start to loosen and curl up, you may trim the loose edges. Do not remove adhesive strips completely unless your health care provider tells you to do that.  Check your port insertion site every day for signs of infection. Check for: ? Redness, swelling, or pain. ? Fluid or blood. ? Warmth. ? Pus or a bad smell.      Activity  Return to your normal activities as told by your health care provider. Ask your health care provider what activities are safe for you.  Do not  lift anything that is heavier than 10 lb (4.5 kg), or the limit that you are told, until your health care provider says that it is safe. General instructions  Take over-the-counter and prescription medicines only as told by your health care provider.  Do not take baths, swim, or use a hot tub until your health care provider approves. Ask your health care provider if you may take showers. You may only be allowed to take sponge baths.  Do not drive for 24 hours if you were given a sedative during your procedure.  Wear a medical alert bracelet in case of an emergency. This will tell any health care providers that you have a port.  Keep all follow-up visits as told by your health care provider. This is important. Contact a health care provider if:  You cannot flush your port with saline as directed, or you cannot draw blood from the port.  You have a fever or chills.  You have redness, swelling, or pain around your port insertion site.  You have fluid or blood coming from your port insertion site.  Your port insertion site feels warm to the touch.  You have pus or a bad smell coming from the port insertion site. Get help right away if:  You have chest pain or shortness of breath.  You have bleeding from your port that you cannot control. Summary  Take care of the port as told by your   health care provider. Keep the manufacturer's information card with you at all times.  Change your dressing as told by your health care provider.  Contact a health care provider if you have a fever or chills or if you have redness, swelling, or pain around your port insertion site.  Keep all follow-up visits as told by your health care provider. This information is not intended to replace advice given to you by your health care provider. Make sure you discuss any questions you have with your health care provider. Document Revised: 01/24/2018 Document Reviewed: 01/24/2018 Elsevier Patient Education   2021 Elsevier Inc.  

## 2020-11-11 NOTE — Progress Notes (Signed)
[place] [phone]   [phone]  OFFICE PROGRESS NOTE  [name], MD No address on file  DIAGNOSIS: Stage IIb/IIIa (T2b, N0/N2, M0), non-small cell lung cancer, squamous cell carcinoma diagnosed in July 2019 and presented with large right hilar mass with questionable mediastinal invasion  PRIOR THERAPY:  1) Concurrent chemoradiation with weekly carboplatin for AUC of 2 and paclitaxel 45 mg/M2. First dose 02/27/2018.Status post 5 cycles. 2) Consolidationimmunotherapy with Imfinzi 10 mg/kg every 2 weeks.First dose given on 05/17/2018.Status post 18 cycles discontinued secondary to intolerance.  CURRENT THERAPY:Observation.  INTERVAL HISTORY: Darrell [name] 75 y.o. male returns to the clinic today for 6 months follow-up visit accompanied by his wife.  The patient is feeling fine today with no concerning complaints except for mild cough.  He denied having any chest pain, shortness of breath or hemoptysis.  He denied having any weight loss or night sweats.  He has no nausea, vomiting, diarrhea or constipation.  He has no headache or visual changes.  He had repeat CT scan of the chest performed recently and he is here for evaluation and discussion of his scan results.   MEDICAL HISTORY: Past Medical History:  Diagnosis Date  . Aortic atherosclerosis ([place]) 07/18/2018  . Cellulitis of right lower extremity   . Chronic kidney disease   . Diabetes mellitus without complication ([place])   . Dyslipidemia   . Hypertension   . Prostate CA ([place]) dx'd 2012  . rt lung ca dx'd 01/2018  . Thoracic aortic aneurysm without rupture ([place]) 07/18/2018    ALLERGIES:  is allergic to percocet [oxycodone-acetaminophen] and lisinopril.  MEDICATIONS:  Current Outpatient Medications  Medication Sig Dispense Refill  . albuterol (PROVENTIL HFA;VENTOLIN HFA) 108 (90 Base) MCG/ACT inhaler Inhale 2 puffs into the lungs every 4 (four) hours as needed for wheezing  or shortness of breath. 1 Inhaler 5  . Ascorbic Acid (VITAMIN C) 100 MG tablet Take 1 tablet by mouth daily.    . ASPIRIN LOW DOSE 81 MG EC tablet Take 81 mg by mouth daily.    . carvedilol (COREG) 3.125 MG tablet Take 3.125 mg by mouth 2 (two) times daily.    . fenofibrate 160 MG tablet Take 160 mg by mouth daily.     . furosemide (LASIX) 40 MG tablet Take 1 tablet (40 mg total) by mouth 2 (two) times daily. Take additional 40 mg tablet/day if weight is up more than 3 lbs in a day or 5 lbs in 2 day. 60 tablet 0  . hydrOXYzine (ATARAX/VISTARIL) 10 MG tablet Take 1 tablet (10 mg total) by mouth 3 (three) times daily as needed. 30 tablet 0  . insulin detemir (LEVEMIR) 100 UNIT/ML FlexPen Inject 10 Units into the skin daily.     . Insulin Pen Needle 31G X 8 MM MISC USE AS DIRECTED    . lidocaine-prilocaine (EMLA) cream Apply 1 application topically as needed. 30 g 0  . losartan (COZAAR) 25 MG tablet Take 2 mg by mouth in the morning and at bedtime.    . MULTIPLE VITAMINS ESSENTIAL PO Take 1 tablet by mouth daily. "With zinc"    . Omega-3 1000 MG CAPS Take 1,200 mg by mouth 2 (two) times daily.     Marland Kitchen POLY-IRON 150 150 MG capsule Take by mouth.    . polyethylene glycol (MIRALAX / GLYCOLAX) 17 g packet Take 17 g by mouth daily. 14 each 0  . Potassium Chloride ER  20 MEQ TBCR Take 20 mEq by mouth daily for 7 days. 7 tablet 0  . pravastatin (PRAVACHOL) 40 MG tablet Take 40 mg by mouth daily.     Marland Kitchen senna-docusate (SENOKOT-S) 8.6-50 MG tablet Take 2 tablets by mouth 2 (two) times daily.    . sildenafil (VIAGRA) 50 MG tablet Take by mouth.     No current facility-administered medications for this visit.   Facility-Administered Medications Ordered in Other Visits  Medication Dose Route Frequency Provider Last Rate Last Admin  . sodium chloride flush (NS) 0.9 % injection 10 mL  10 mL Intracatheter PRN Curt Bears, MD   10 mL at 06/28/18 1501  . sodium chloride flush (NS) 0.9 % injection 10 mL  10 mL  Intracatheter PRN Curt Bears, MD   10 mL at 11/11/20 8185    SURGICAL HISTORY:  Past Surgical History:  Procedure Laterality Date  . IR GUIDED DRAIN W CATHETER PLACEMENT  11/15/2018  . IR GUIDED DRAIN W CATHETER PLACEMENT  01/31/2019  . IR INSTILL VIA CHEST TUBE AGENT FOR FIBRINOLYSIS INI DAY  01/23/2019  . IR REMOVAL OF PLURAL CATH W/CUFF  01/31/2019  . IR REMOVAL OF PLURAL CATH W/CUFF  05/08/2019  . IR THORACENTESIS ASP PLEURAL SPACE W/IMG GUIDE  09/05/2018  . IR THORACENTESIS ASP PLEURAL SPACE W/IMG GUIDE  10/11/2018  . IR THORACENTESIS ASP PLEURAL SPACE W/IMG GUIDE  10/30/2018  . PORTACATH PLACEMENT Left 02/21/2018   Procedure: INSERTION PORT-A-CATH;  Surgeon: Grace Isaac, MD;  Location: [facility] OR;  Service: Thoracic;  Laterality: Left;    REVIEW OF SYSTEMS:  A comprehensive review of systems was negative except for: Respiratory: positive for cough   PHYSICAL EXAMINATION: General appearance: alert, cooperative and no distress Head: Normocephalic, without obvious abnormality, atraumatic Neck: no adenopathy, no JVD, supple, symmetrical, trachea midline and thyroid not enlarged, symmetric, no tenderness/mass/nodules Lymph nodes: Cervical, supraclavicular, and axillary nodes normal. Resp: clear to auscultation bilaterally Back: symmetric, no curvature. ROM normal. No CVA tenderness. Cardio: regular rate and rhythm, S1, S2 normal, no murmur, click, rub or gallop GI: soft, non-tender; bowel sounds normal; no masses,  no organomegaly Extremities: extremities normal, atraumatic, no cyanosis or edema  ECOG PERFORMANCE STATUS: 1 - Symptomatic but completely ambulatory  Blood pressure 126/63, pulse 74, temperature (!) 97 F (36.1 C), temperature source Tympanic, resp. rate 18, height 5\' 8"  (1.727 m), weight 225 lb 11.2 oz (102.4 kg), SpO2 99 %.  LABORATORY DATA: Lab Results  Component Value Date   WBC 5.2 05/06/2020   HGB 12.3 (L) 05/06/2020   HCT 36.6 (L) 05/06/2020   MCV 95.6  05/06/2020   PLT 191 05/06/2020      Chemistry      Component Value Date/Time   NA 136 05/06/2020 0840   K 3.5 05/06/2020 0840   CL 96 (L) 05/06/2020 0840   CO2 26 05/06/2020 0840   BUN 60 (H) 05/06/2020 0840   CREATININE 2.27 (H) 05/06/2020 0840      Component Value Date/Time   CALCIUM 9.8 05/06/2020 0840   ALKPHOS 53 05/06/2020 0840   AST 37 05/06/2020 0840   ALT 32 05/06/2020 0840   BILITOT 1.1 05/06/2020 0840       RADIOGRAPHIC STUDIES: CT Chest Wo Contrast  Result Date: 11/04/2020 CLINICAL DATA:  Primary Cancer Type: Lung Imaging Indication: Routine surveillance Interval therapy since last imaging? No Initial Cancer Diagnosis Date: 01/31/2018; Established by: Biopsy-proven Detailed Pathology: Stage IIb/IIIa non-small cell lung cancer, squamous cell carcinoma. Primary  Tumor location:  Right hilum. Surgeries: No. Chemotherapy: Yes; Ongoing?  No; Most recent administration: 04/2018 Immunotherapy?  Yes; Type: Imfinzi; Ongoing? No Radiation therapy? Yes; Date Range: 02/27/2018 - 04/13/2018; Target: Right lung Other Cancers: Prostate cancer 2012. EXAM: CT CHEST WITHOUT CONTRAST TECHNIQUE: Multidetector CT imaging of the chest was performed following the standard protocol without IV contrast. COMPARISON:  Most recent CT chest 05/06/2020.  10/13/2018 PET-CT. FINDINGS: Cardiovascular: Normal heart size. No pericardial effusion identified. Aortic atherosclerosis. Coronary artery atherosclerotic calcifications. Stable aneurysmal dilatation of the ascending thoracic aorta measuring 4.2 cm Mediastinum/Nodes: Normal appearance of the thyroid gland. The trachea appears patent and is midline. Normal appearance of the esophagus. No enlarged axillary, supraclavicular, mediastinal or hilar lymph nodes. Lungs/Pleura: Right lung perihilar masslike architectural distortion and fibrosis is unchanged from the previous exam compatible with changes due to external beam radiation. This is unchanged when  compared with the prior examination. Small loculated right pleural effusion is again identified and appears stable from the previous exam. No specific findings identified to suggest locally recurrent tumor. Stable perifissural nodule within the left midlung compatible with intra pulmonary lymph node. No suspicious nodules or masses identified. Upper Abdomen: No acute abnormality. Aortic atherosclerosis. Previous cholecystectomy. Musculoskeletal: Degenerative disc disease identified within the thoracic spine. Marked bilateral glenohumeral joint osteoarthritis noted. No acute or suspicious osseous findings. IMPRESSION: 1. Stable CT of the chest. No findings to suggest locally recurrent tumor or metastatic disease. 2. Stable appearance of right perihilar masslike architectural distortion and fibrosis compatible with changes due to external beam radiation. 3. Aortic atherosclerosis. Coronary artery calcifications noted. 4. 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 Recommend annual imaging followup by CTA or MRA. This recommendation follows 2010 ACCF/AHA/AATS/ACR/ASA/SCA/SCAI/SIR/STS/SVM Guidelines for the Diagnosis and Management of Patients with Thoracic Aortic Disease. Circulation. 2010; 121: W431-V400. Aortic aneurysm NOS (ICD10-I71.9) Aortic Atherosclerosis (ICD10-I70.0). Electronically Signed   By: Kerby Moors M.D.   On: 11/04/2020 10:16    ASSESSMENT AND PLAN: This is a very pleasant 75 years old white male with a stage IIb/IIIa non-small cell lung cancer, squamous cell carcinoma diagnosed in July 2019. The patient is currently undergoing a course of concurrent chemoradiation with weekly carboplatin and paclitaxel status post 5 cycles.  He had partial response after the initial induction treatment. The patient was started on treatment with consolidation Imfinzi status post 18 cycles.  The patient has been tolerating his treatment well except for the skin rash and  itching.  He reaches the point that he could not sleep from the itching. He completed 18 cycles of this treatment.  The patient has a lot of other comorbidities especially the recent diagnosis with congestive heart failure. The patient has been in observation since that time and he is feeling fine. He had repeat CT scan of the chest performed recently.  I personally and independently reviewed the scans and discussed the results with the patient and his wife. His scan showed no concerning findings for disease progression. I recommended for him to continue on observation with repeat CT scan of the chest in 6 months. The patient was advised to call immediately if he has any other concerning symptoms in the interval. The patient voices understanding of current disease status and treatment options and is in agreement with the current care plan. All questions were answered. The patient knows to call the clinic with any problems, questions or concerns. We can certainly see the patient much sooner if necessary.  Disclaimer: This note was  dictated with voice recognition software. Similar sounding words can inadvertently be transcribed and may not be corrected upon review.

## 2020-11-11 NOTE — Progress Notes (Signed)
Spoke with Darrell [name] and his wife today.  He is doing well and his plan of care is follow up in 6 months with scan.  It was great seeing him today.

## 2021-04-19 IMAGING — US ABSCESS DRAINAGE
1 series · 2 of 2 positions shown · non-contrast
Comparison: none

INDICATION: 72-year-old male with a history of malignant right-sided pleural
effusion with nonfunctioning existing PleurX catheter which was
placed November 15, 2018

[Series 1: abscess drainage · 2 of 2 slices shown]
[im 1/2]
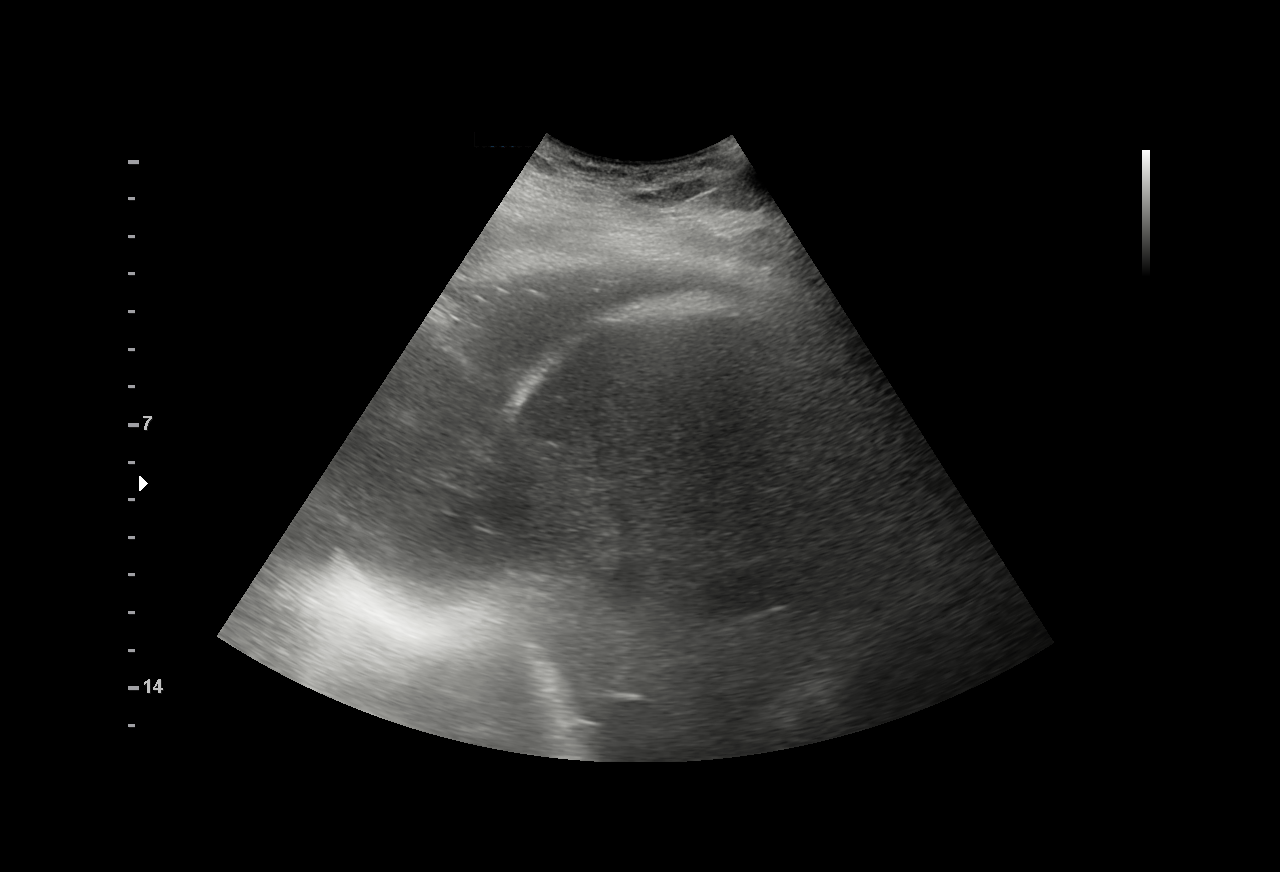
[im 2/2]
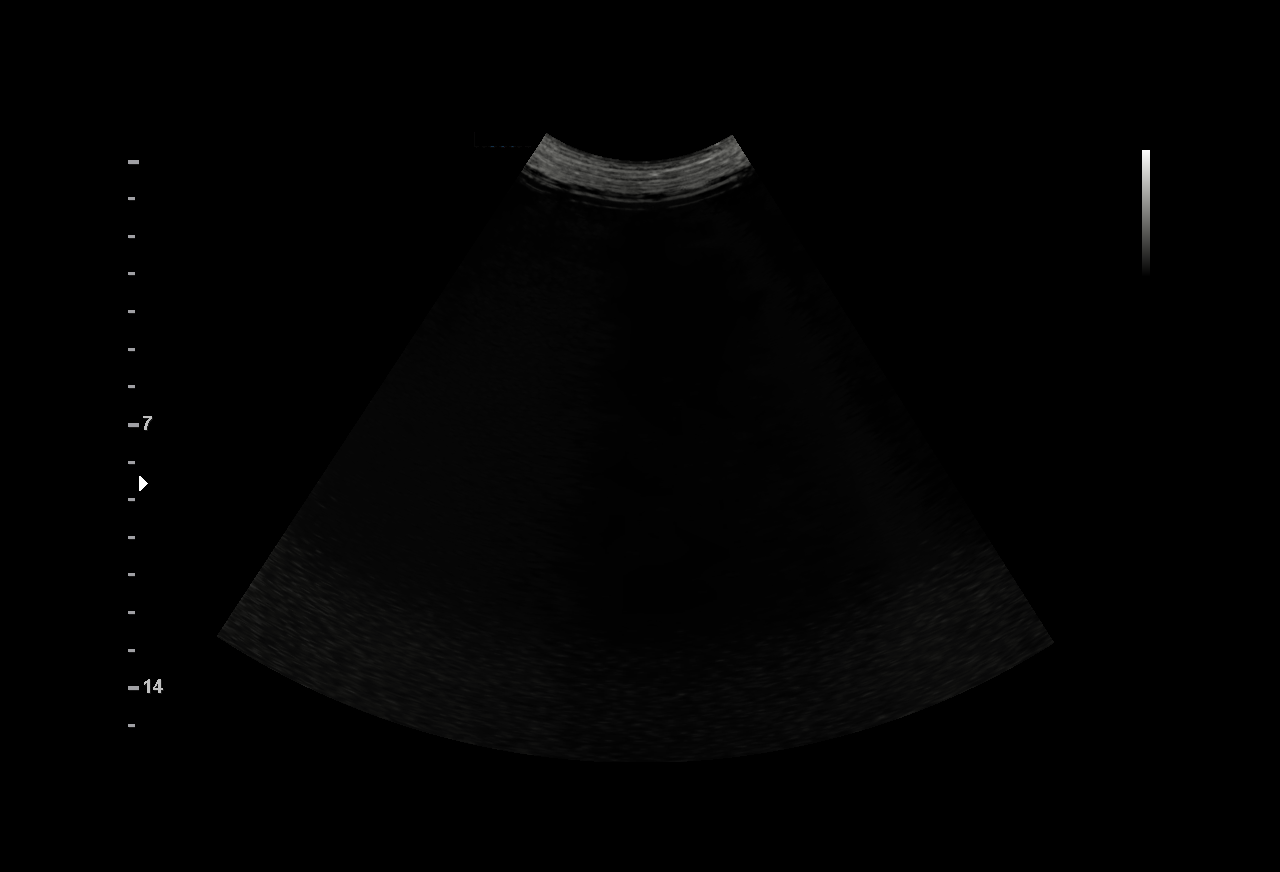

[2 of 2 positions shown; findings below may reference images not displayed]

EXAM:
IMAGE GUIDED PLACEMENT OF A NEW RIGHT-SIDED PLEURAL TUNNELED
CATHETER AND REMOVAL OF THE PRIOR CATHETER

MEDICATIONS:
2 G ANCEF the antibiotics were administered within an appropriate
time frame prior to the initiation of the procedure.

ANESTHESIA/SEDATION:
Fentanyl 50 mcg IV; Versed 1.0 mg IV

Moderate Sedation Time:  20 MINUTES

The patient was continuously monitored during the procedure by the
interventional radiology nurse under my direct supervision.

COMPLICATIONS:
None

PROCEDURE:
The procedure, risks, benefits, and alternatives were explained to
the patient and the patient's family. Specific risks that were
addressed included bleeding, infection, pneumothorax, need for
further procedure, chance of delayed pneumothorax or hemorrhage,
hemoptysis, cardiopulmonary collapse, death. Questions regarding the
procedure were encouraged and answered. The patient understands and
consents to the procedure.

The right chest wall and the indwelling catheter was prepped with
chlorhexidine in a sterile fashion, and a sterile drape was applied
covering the operative field. A sterile gown and sterile gloves were
used for the procedure. Local anesthesia was provided with 1%
Lidocaine. Ultrasound image documentation was performed.

After creating a small skin incision, a 19 gauge needle was advanced
into the pleural cavity/pleural fluid under ultrasound guidance. A
guide wire was then advanced under fluoroscopy into the pleural
space. Pleural access was dilated serially and a 16-French peel-away
sheath placed.

The skin and subcutaneous tissues were generously infiltrated with
1% lidocaine from the puncture site over the pleura along the
intercostal margin anteriorly. A small stab incision was made with
11 blade scalpel at the insertion site of the catheter, and the
catheter was back tunneled to the site at the pleural puncture.

A tunneled CareFusion Pleurex catheter was placed. This was tunneled
from the incision 5 cm anterior to the pleural access to the access
site. The catheter was advanced through the peel-away sheath. The
sheath was then removed.

At this point the prior tunneled catheter was removed from the
pleural space.

Final catheter positioning was confirmed with a fluoroscopic spot
image.

Both access incision sites were closed with Derma bond after
chlorhexidine wash was again applied. Dermabond was applied to the
catheterization incision. Approximately 400 cc of fluid performed
via thoracentesis through the new catheter utilizing vacuum bottle

The patient tolerated the procedure well and remained
hemodynamically stable throughout.

No complications were encountered and no significant blood loss was
encountered.
FINDINGS: Ultrasound demonstrates complex fluid within the dependent aspects
of the right pleural space indicating proteinaceous/loculated fluid.

Inspissated blood and debris found within the remote tunneled
catheter occupying approximately 50% of the length of the lumen of
the catheter

400 cc of amber fluid removed through the current catheter with the
final image demonstrating residual scarring/complex fluid/loculated
fluid at the right lung base. The new tunneled pleural catheter
appears subpleural.
IMPRESSION: Status post placement of a new PleurX catheter/tunneled catheter
into the right residual pleural fluid, with removal of the prior
tunneled PleurX catheter.

## 2021-04-19 IMAGING — DX PORTABLE CHEST - 1 VIEW
1 series · 1 of 1 positions shown · non-contrast
Comparison: January 06, 2019

CLINICAL DATA: Preop film for drainage of lung.

EXAM:
PORTABLE CHEST 1 VIEW

[chest pa]
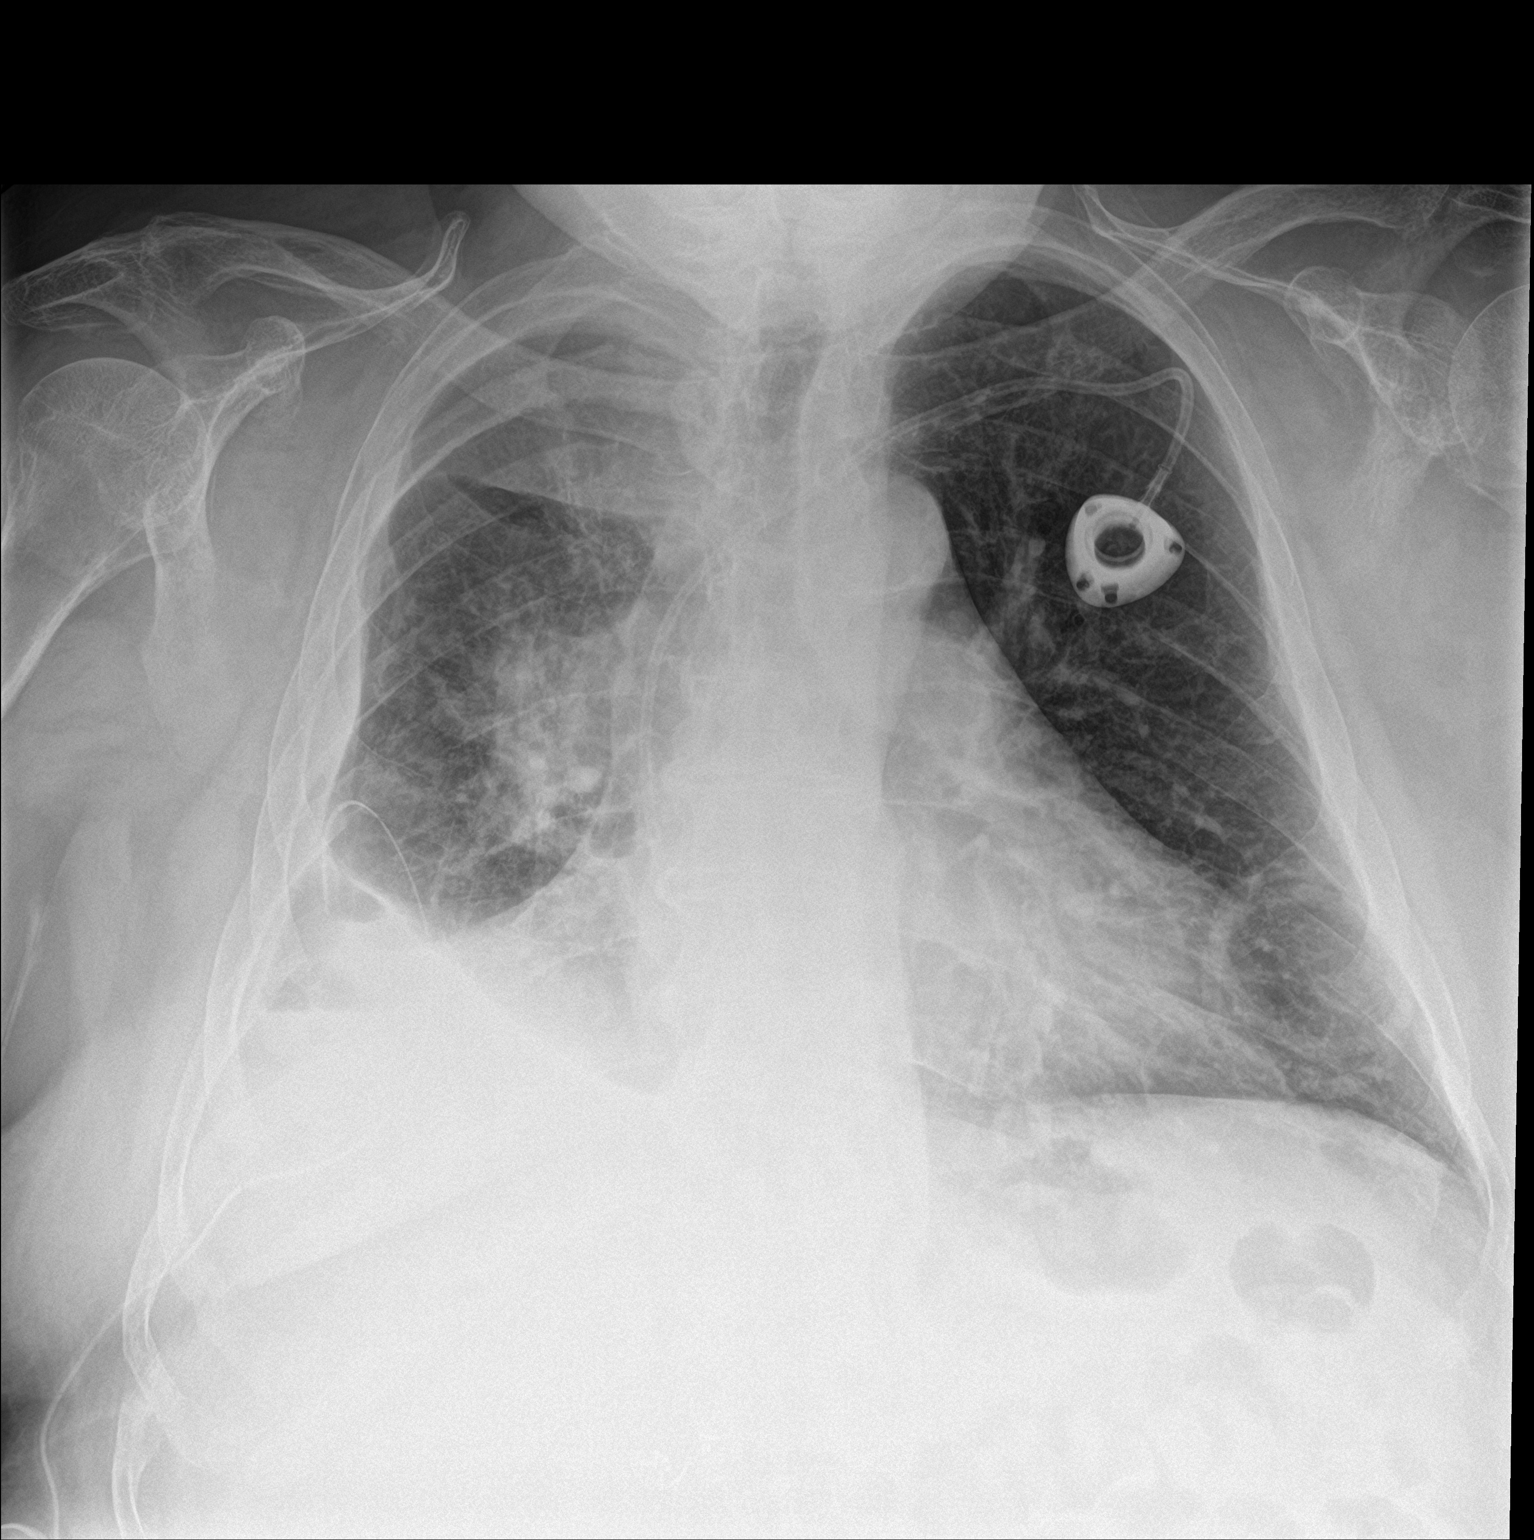

[1 of 1 positions shown; findings below may reference images not displayed]

FINDINGS: The heart size and mediastinal contours are stable. Moderate right
pleural effusion with consolidation of the right mid and lower lung
are unchanged. Consolidation of the right perihilar lung is
identified unchanged. Right chest tube is unchanged. The visualized
skeletal structures are stable.
IMPRESSION: Moderate right pleural effusion with consolidation of the right mid
and lower lung are unchanged. Consolidation of the right perihilar
lung is unchanged.

## 2021-05-12 ENCOUNTER — Other Ambulatory Visit: Payer: Medicare Other

## 2021-05-13 ENCOUNTER — Inpatient Hospital Stay: Payer: Medicare Other | Attending: Internal Medicine

## 2021-05-13 ENCOUNTER — Ambulatory Visit (HOSPITAL_COMMUNITY)
Admission: RE | Admit: 2021-05-13 | Discharge: 2021-05-13 | Disposition: A | Discharge status: Home / Self Care | Payer: Medicare Other | Source: Ambulatory Visit | Attending: Internal Medicine | Admitting: Internal Medicine

## 2021-05-13 ENCOUNTER — Other Ambulatory Visit: Payer: Self-pay

## 2021-05-13 DIAGNOSIS — Z8546 Personal history of malignant neoplasm of prostate: Secondary | ICD-10-CM | POA: Insufficient documentation

## 2021-05-13 DIAGNOSIS — I2510 Atherosclerotic heart disease of native coronary artery without angina pectoris: Secondary | ICD-10-CM | POA: Diagnosis not present

## 2021-05-13 DIAGNOSIS — J90 Pleural effusion, not elsewhere classified: Secondary | ICD-10-CM | POA: Insufficient documentation

## 2021-05-13 DIAGNOSIS — N189 Chronic kidney disease, unspecified: Secondary | ICD-10-CM | POA: Insufficient documentation

## 2021-05-13 DIAGNOSIS — Z7982 Long term (current) use of aspirin: Secondary | ICD-10-CM | POA: Diagnosis not present

## 2021-05-13 DIAGNOSIS — Z95828 Presence of other vascular implants and grafts: Secondary | ICD-10-CM

## 2021-05-13 DIAGNOSIS — I700 Atherosclerosis of aorta: Secondary | ICD-10-CM | POA: Insufficient documentation

## 2021-05-13 DIAGNOSIS — C3490 Malignant neoplasm of unspecified part of unspecified bronchus or lung: Secondary | ICD-10-CM | POA: Diagnosis not present

## 2021-05-13 DIAGNOSIS — I130 Hypertensive heart and chronic kidney disease with heart failure and stage 1 through stage 4 chronic kidney disease, or unspecified chronic kidney disease: Secondary | ICD-10-CM | POA: Insufficient documentation

## 2021-05-13 DIAGNOSIS — Z794 Long term (current) use of insulin: Secondary | ICD-10-CM | POA: Diagnosis not present

## 2021-05-13 DIAGNOSIS — E1122 Type 2 diabetes mellitus with diabetic chronic kidney disease: Secondary | ICD-10-CM | POA: Diagnosis not present

## 2021-05-13 DIAGNOSIS — Z9221 Personal history of antineoplastic chemotherapy: Secondary | ICD-10-CM | POA: Diagnosis not present

## 2021-05-13 DIAGNOSIS — C3411 Malignant neoplasm of upper lobe, right bronchus or lung: Secondary | ICD-10-CM | POA: Insufficient documentation

## 2021-05-13 DIAGNOSIS — R21 Rash and other nonspecific skin eruption: Secondary | ICD-10-CM | POA: Insufficient documentation

## 2021-05-13 DIAGNOSIS — Z79899 Other long term (current) drug therapy: Secondary | ICD-10-CM | POA: Diagnosis not present

## 2021-05-13 DIAGNOSIS — E785 Hyperlipidemia, unspecified: Secondary | ICD-10-CM | POA: Diagnosis not present

## 2021-05-13 LAB — CBC WITH DIFFERENTIAL (CANCER CENTER ONLY)
Abs Immature Granulocytes: 0.01 10*3/uL (ref 0.00–0.07)
Basophils Absolute: 0.1 10*3/uL (ref 0.0–0.1)
Basophils Relative: 2 %
Eosinophils Absolute: 0.2 10*3/uL (ref 0.0–0.5)
Eosinophils Relative: 4 %
HCT: 41.2 % (ref 39.0–52.0)
Hemoglobin: 13.4 g/dL (ref 13.0–17.0)
Immature Granulocytes: 0 %
Lymphocytes Relative: 11 %
Lymphs Abs: 0.7 10*3/uL (ref 0.7–4.0)
MCH: 29.5 pg (ref 26.0–34.0)
MCHC: 32.5 g/dL (ref 30.0–36.0)
MCV: 90.5 fL (ref 80.0–100.0)
Monocytes Absolute: 0.5 10*3/uL (ref 0.1–1.0)
Monocytes Relative: 9 %
Neutro Abs: 4.3 10*3/uL (ref 1.7–7.7)
Neutrophils Relative %: 74 %
Platelet Count: 182 10*3/uL (ref 150–400)
RBC: 4.55 MIL/uL (ref 4.22–5.81)
RDW: 15.5 % (ref 11.5–15.5)
WBC Count: 5.8 10*3/uL (ref 4.0–10.5)
nRBC: 0 % (ref 0.0–0.2)

## 2021-05-13 LAB — CMP (CANCER CENTER ONLY)
ALT: 18 U/L (ref 0–44)
AST: 24 U/L (ref 15–41)
Albumin: 3.8 g/dL (ref 3.5–5.0)
Alkaline Phosphatase: 64 U/L (ref 38–126)
Anion gap: 9 (ref 5–15)
BUN: 48 mg/dL — ABNORMAL HIGH (ref 8–23)
CO2: 24 mmol/L (ref 22–32)
Calcium: 9.2 mg/dL (ref 8.9–10.3)
Chloride: 104 mmol/L (ref 98–111)
Creatinine: 1.65 mg/dL — ABNORMAL HIGH (ref 0.61–1.24)
GFR, Estimated: 44 mL/min — ABNORMAL LOW (ref >60)
Glucose, Bld: 149 mg/dL — ABNORMAL HIGH (ref 70–99)
Potassium: 4.5 mmol/L (ref 3.5–5.1)
Sodium: 137 mmol/L (ref 135–145)
Total Bilirubin: 0.5 mg/dL (ref 0.3–1.2)
Total Protein: 7.4 g/dL (ref 6.5–8.1)

## 2021-05-13 MED ORDER — HEPARIN SOD (PORK) LOCK FLUSH 100 UNIT/ML IV SOLN
500.0000 [IU] | Freq: Once | INTRAVENOUS | Status: AC | PRN
Start: 2021-05-13 — End: 2021-05-13
  Administered 2021-05-13: 500 [IU]

## 2021-05-13 MED ORDER — SODIUM CHLORIDE 0.9% FLUSH
10.0000 mL | INTRAVENOUS | Status: DC | PRN
  Administered 2021-05-13: 10 mL

## 2021-05-14 ENCOUNTER — Inpatient Hospital Stay (HOSPITAL_BASED_OUTPATIENT_CLINIC_OR_DEPARTMENT_OTHER): Payer: Medicare Other | Admitting: Internal Medicine

## 2021-05-14 ENCOUNTER — Encounter: Payer: Self-pay | Admitting: Internal Medicine

## 2021-05-14 VITALS — BP 149/92 | HR 74 | Temp 97.2°F | Resp 19 | Ht 68.0 in | Wt 224.1 lb

## 2021-05-14 DIAGNOSIS — C3490 Malignant neoplasm of unspecified part of unspecified bronchus or lung: Secondary | ICD-10-CM | POA: Diagnosis not present

## 2021-05-14 DIAGNOSIS — C3411 Malignant neoplasm of upper lobe, right bronchus or lung: Secondary | ICD-10-CM | POA: Diagnosis not present

## 2021-05-14 NOTE — Progress Notes (Signed)
[place] [phone]   [phone]  OFFICE PROGRESS NOTE  [name], MD 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  First dose 02/27/2018.  Status post 5 cycles. 2) Consolidation immunotherapy with Imfinzi 10 mg/kg every 2 weeks.  First dose given on 05/17/2018.  Status post 18 cycles discontinued secondary to intolerance.   CURRENT THERAPY: Observation.  INTERVAL HISTORY: Darrell [name] 75 y.o. male returns to the clinic today for follow-up visit accompanied by his wife.  The patient is feeling fine today with no concerning complaints.  He exercises at regular basis.  He denied having any current chest pain, shortness of breath, cough or hemoptysis.  He denied having any fever or chills.  He has no nausea, vomiting, diarrhea or constipation.  He has no headache or visual changes.  He is here today for evaluation with repeat CT scan of the chest for restaging of his disease.   MEDICAL HISTORY: Past Medical History:  Diagnosis Date   Aortic atherosclerosis ([place]) 07/18/2018   Cellulitis of right lower extremity    Chronic kidney disease    Diabetes mellitus without complication ([place])    Dyslipidemia    Hypertension    Prostate CA ([place]) dx'd 2012   rt lung ca dx'd 01/2018   Thoracic aortic aneurysm without rupture ([place]) 07/18/2018    ALLERGIES:  is allergic to percocet [oxycodone-acetaminophen] and lisinopril.  MEDICATIONS:  Current Outpatient Medications  Medication Sig Dispense Refill   albuterol (PROVENTIL HFA;VENTOLIN HFA) 108 (90 Base) MCG/ACT inhaler Inhale 2 puffs into the lungs every 4 (four) hours as needed for  wheezing or shortness of breath. 1 Inhaler 5   Ascorbic Acid (VITAMIN C) 100 MG tablet Take 1 tablet by mouth daily.     ASPIRIN LOW DOSE 81 MG EC tablet Take 81 mg by mouth daily.     carvedilol (COREG) 3.125 MG tablet Take 3.125 mg by mouth 2 (two) times daily.     fenofibrate 160 MG tablet Take 160 mg by mouth daily.      furosemide (LASIX) 40 MG tablet Take 1 tablet (40 mg total) by mouth 2 (two) times daily. Take additional 40 mg tablet/day if weight is up more than 3 lbs in a day or 5 lbs in 2 day. 60 tablet 0   hydrOXYzine (ATARAX/VISTARIL) 10 MG tablet Take 1 tablet (10 mg total) by mouth 3 (three) times daily as needed. 30 tablet 0   insulin detemir (LEVEMIR) 100 UNIT/ML FlexPen Inject 10 Units into the skin daily.      Insulin Pen Needle 31G X 8 MM MISC USE AS DIRECTED     lidocaine-prilocaine (EMLA) cream Apply 1 application topically as needed. 30 g 0   losartan (COZAAR) 25 MG tablet Take 2 mg by mouth in the morning and at bedtime.     MULTIPLE VITAMINS ESSENTIAL PO Take 1 tablet by mouth daily. "With zinc"     Omega-3 1000 MG CAPS Take 1,200 mg by mouth 2 (two) times daily.      POLY-IRON 150 150 MG capsule Take by mouth.     polyethylene glycol (MIRALAX / GLYCOLAX) 17 g packet Take 17 g  by mouth daily. 14 each 0   Potassium Chloride ER 20 MEQ TBCR Take 20 mEq by mouth daily for 7 days. 7 tablet 0   pravastatin (PRAVACHOL) 40 MG tablet Take 40 mg by mouth daily.      senna-docusate (SENOKOT-S) 8.6-50 MG tablet Take 2 tablets by mouth 2 (two) times daily.     sildenafil (VIAGRA) 50 MG tablet Take by mouth.     No current facility-administered medications for this visit.   Facility-Administered Medications Ordered in Other Visits  Medication Dose Route Frequency Provider Last Rate Last Admin   sodium chloride flush (NS) 0.9 % injection 10 mL  10 mL Intracatheter PRN Curt Bears, MD   10 mL at 06/28/18 1501    SURGICAL HISTORY:  Past Surgical History:  Procedure  Laterality Date   IR GUIDED DRAIN W CATHETER PLACEMENT  11/15/2018   IR GUIDED DRAIN W CATHETER PLACEMENT  01/31/2019   IR INSTILL VIA CHEST TUBE AGENT FOR FIBRINOLYSIS INI DAY  01/23/2019   IR REMOVAL OF PLURAL CATH W/CUFF  01/31/2019   IR REMOVAL OF PLURAL CATH W/CUFF  05/08/2019   IR THORACENTESIS ASP PLEURAL SPACE W/IMG GUIDE  09/05/2018   IR THORACENTESIS ASP PLEURAL SPACE W/IMG GUIDE  10/11/2018   IR THORACENTESIS ASP PLEURAL SPACE W/IMG GUIDE  10/30/2018   PORTACATH PLACEMENT Left 02/21/2018   Procedure: INSERTION PORT-A-CATH;  Surgeon: Grace Isaac, MD;  Location: [facility] OR;  Service: Thoracic;  Laterality: Left;    REVIEW OF SYSTEMS:  A comprehensive review of systems was negative.   PHYSICAL EXAMINATION: General appearance: alert, cooperative, and no distress Head: Normocephalic, without obvious abnormality, atraumatic Neck: no adenopathy, no JVD, supple, symmetrical, trachea midline, and thyroid not enlarged, symmetric, no tenderness/mass/nodules Lymph nodes: Cervical, supraclavicular, and axillary nodes normal. Resp: clear to auscultation bilaterally Back: symmetric, no curvature. ROM normal. No CVA tenderness. Cardio: regular rate and rhythm, S1, S2 normal, no murmur, click, rub or gallop GI: soft, non-tender; bowel sounds normal; no masses,  no organomegaly Extremities: extremities normal, atraumatic, no cyanosis or edema  ECOG PERFORMANCE STATUS: 1 - Symptomatic but completely ambulatory  Blood pressure (!) 149/92, pulse 74, temperature (!) 97.2 F (36.2 C), temperature source Tympanic, resp. rate 19, height 5\' 8"  (1.727 m), weight 224 lb 1.6 oz (101.7 kg), SpO2 98 %.  LABORATORY DATA: Lab Results  Component Value Date   WBC 5.8 05/13/2021   HGB 13.4 05/13/2021   HCT 41.2 05/13/2021   MCV 90.5 05/13/2021   PLT 182 05/13/2021      Chemistry      Component Value Date/Time   NA 137 05/13/2021 0940   K 4.5 05/13/2021 0940   CL 104 05/13/2021 0940   CO2 24 05/13/2021  0940   BUN 48 (H) 05/13/2021 0940   CREATININE 1.65 (H) 05/13/2021 0940      Component Value Date/Time   CALCIUM 9.2 05/13/2021 0940   ALKPHOS 64 05/13/2021 0940   AST 24 05/13/2021 0940   ALT 18 05/13/2021 0940   BILITOT 0.5 05/13/2021 0940       RADIOGRAPHIC STUDIES: CT Chest Wo Contrast  Result Date: 05/13/2021 CLINICAL DATA:  Primary Cancer Type: Lung Imaging Indication: Routine surveillance Interval therapy since last imaging? No Initial Cancer Diagnosis Date: 01/31/2018; Established by: Biopsy-proven Detailed Pathology: Stage IIb/IIIa non-small cell lung cancer, squamous cell carcinoma. Primary Tumor location:  Right hilum. Surgeries: No. Chemotherapy: Yes; Ongoing?  No; Most recent administration: 04/2018 Immunotherapy?  Yes; Type: Imfinzi; Ongoing? No  Radiation therapy? Yes; Date Range: 02/27/2018 - 04/13/2018; Target: Right lung Other Cancer Therapies: Prostate cancer 2012. EXAM: CT CHEST WITHOUT CONTRAST TECHNIQUE: Multidetector CT imaging of the chest was performed following the standard protocol without IV contrast. COMPARISON:  Most recent CT chest 11/04/2020.  10/13/2018 PET-CT. FINDINGS: Cardiovascular: Calcified atherosclerosis and calcified coronary artery disease as before. Heart size normal. Normal caliber of central pulmonary vessels. Ascending thoracic aorta at 4.3 cm. Limited assessment of cardiovascular structures given lack of intravenous contrast. Mediastinum/Nodes: No adenopathy in the chest. Fullness about RIGHT hilum compatible with post treatment changes. Findings are unchanged. Lungs/Pleura: Perihilar consolidative changes, parenchymal distortion and bronchiectasis without change. Small RIGHT-sided pleural effusion may be slightly diminished since previous imaging, in the RIGHT lower chest measuring approximately 18 mm greatest thickness as compared to 29 mm, some of this could change with lung volumes. No new or suspicious finding. Stable Peri fissural nodule in the  LEFT chest likely small subpleural lymph node. Upper Abdomen: No acute upper abdominal process. Post cholecystectomy. Musculoskeletal: No acute bone finding. No destructive bone process. Spinal degenerative changes. Marked posttraumatic degenerative changes of the glenohumeral joint on the LEFT. IMPRESSION: Stable post treatment changes in the RIGHT chest. No new or progressive findings. Small RIGHT-sided pleural effusion may be slightly diminished since previous imaging. Ascending thoracic aorta at 4.3 cm. Within 1 mm of previous measurement. Recommend annual imaging followup by CTA or MRA. This recommendation follows 2010 ACCF/AHA/AATS/ACR/ASA/SCA/SCAI/SIR/STS/SVM Guidelines for the Diagnosis and Management of Patients with Thoracic Aortic Disease. Circulation. 2010; 121: X448-J856. Aortic aneurysm NOS (ICD10-I71.9) 5 Calcified atherosclerosis and calcified coronary artery disease. Aortic Atherosclerosis (ICD10-I70.0). Electronically Signed   By: Zetta Bills M.D.   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 The patient is currently undergoing a course of concurrent chemoradiation with weekly carboplatin and paclitaxel status post 5 cycles.  He had partial response after the initial induction treatment. The patient was started on treatment with consolidation Imfinzi status post 18 cycles.  The patient has been tolerating his treatment well except for the skin rash and itching.  He reaches the point that he could not sleep from the itching. He completed 18 cycles of this treatment.  The patient has a lot of other comorbidities especially the recent diagnosis with congestive heart failure. The patient has been in observation since that time and he is feeling fine with no concerning complaints. He had repeat CT scan of the chest performed recently.  I  personally and independently reviewed the scan images and discussed the results with the patient and his wife. His scan showed no concerning findings for disease recurrence or metastasis. I recommended for him to continue on observation with repeat CT scan of the chest in 6 months. The patient was advised to call immediately if he has any concerning symptoms in the interval. The patient voices understanding of current disease status and treatment options and is in agreement with the current care plan. All questions were answered. The patient knows to call the clinic with any problems, questions or concerns. We can certainly see the patient much sooner if necessary.  Disclaimer: This note was dictated with voice recognition software. Similar sounding words can inadvertently be transcribed and may not be corrected upon review.

## 2021-05-18 ENCOUNTER — Telehealth: Payer: Self-pay | Admitting: Internal Medicine

## 2021-05-18 NOTE — Telephone Encounter (Signed)
Scheduled follow-up appointments per 11/3 los. Patient is aware.

## 2021-06-19 ENCOUNTER — Telehealth: Payer: Self-pay | Admitting: Orthopaedic Surgery

## 2021-06-19 NOTE — Telephone Encounter (Signed)
CD placed at front desk for pickup.

## 2021-06-19 NOTE — Telephone Encounter (Signed)
Please copy xray to CD. Patient will pick up

## 2021-11-02 ENCOUNTER — Encounter: Payer: Self-pay | Admitting: Internal Medicine

## 2021-11-05 ENCOUNTER — Encounter: Payer: Self-pay | Admitting: Internal Medicine

## 2021-11-09 ENCOUNTER — Other Ambulatory Visit: Payer: Self-pay

## 2021-11-09 ENCOUNTER — Inpatient Hospital Stay: Payer: HMO | Attending: Internal Medicine

## 2021-11-09 ENCOUNTER — Ambulatory Visit (HOSPITAL_COMMUNITY)
Admission: RE | Admit: 2021-11-09 | Discharge: 2021-11-09 | Disposition: A | Discharge status: Home / Self Care | Payer: HMO | Source: Ambulatory Visit | Attending: Internal Medicine | Admitting: Internal Medicine

## 2021-11-09 ENCOUNTER — Other Ambulatory Visit: Payer: Medicare Other

## 2021-11-09 DIAGNOSIS — C3490 Malignant neoplasm of unspecified part of unspecified bronchus or lung: Secondary | ICD-10-CM | POA: Diagnosis present

## 2021-11-09 DIAGNOSIS — C3491 Malignant neoplasm of unspecified part of right bronchus or lung: Secondary | ICD-10-CM | POA: Insufficient documentation

## 2021-11-09 DIAGNOSIS — I700 Atherosclerosis of aorta: Secondary | ICD-10-CM | POA: Insufficient documentation

## 2021-11-09 DIAGNOSIS — E1122 Type 2 diabetes mellitus with diabetic chronic kidney disease: Secondary | ICD-10-CM | POA: Insufficient documentation

## 2021-11-09 DIAGNOSIS — I509 Heart failure, unspecified: Secondary | ICD-10-CM | POA: Insufficient documentation

## 2021-11-09 DIAGNOSIS — Z923 Personal history of irradiation: Secondary | ICD-10-CM | POA: Insufficient documentation

## 2021-11-09 DIAGNOSIS — Z95828 Presence of other vascular implants and grafts: Secondary | ICD-10-CM

## 2021-11-09 DIAGNOSIS — I130 Hypertensive heart and chronic kidney disease with heart failure and stage 1 through stage 4 chronic kidney disease, or unspecified chronic kidney disease: Secondary | ICD-10-CM | POA: Insufficient documentation

## 2021-11-09 LAB — CBC WITH DIFFERENTIAL (CANCER CENTER ONLY)
Abs Immature Granulocytes: 0 10*3/uL (ref 0.00–0.07)
Basophils Absolute: 0.1 10*3/uL (ref 0.0–0.1)
Basophils Relative: 1 %
Eosinophils Absolute: 0.1 10*3/uL (ref 0.0–0.5)
Eosinophils Relative: 3 %
HCT: 37.6 % — ABNORMAL LOW (ref 39.0–52.0)
Hemoglobin: 12.4 g/dL — ABNORMAL LOW (ref 13.0–17.0)
Immature Granulocytes: 0 %
Lymphocytes Relative: 14 %
Lymphs Abs: 0.5 10*3/uL — ABNORMAL LOW (ref 0.7–4.0)
MCH: 29.6 pg (ref 26.0–34.0)
MCHC: 33 g/dL (ref 30.0–36.0)
MCV: 89.7 fL (ref 80.0–100.0)
Monocytes Absolute: 0.4 10*3/uL (ref 0.1–1.0)
Monocytes Relative: 9 %
Neutro Abs: 2.9 10*3/uL (ref 1.7–7.7)
Neutrophils Relative %: 73 %
Platelet Count: 158 10*3/uL (ref 150–400)
RBC: 4.19 MIL/uL — ABNORMAL LOW (ref 4.22–5.81)
RDW: 15 % (ref 11.5–15.5)
WBC Count: 4 10*3/uL (ref 4.0–10.5)
nRBC: 0 % (ref 0.0–0.2)

## 2021-11-09 LAB — CMP (CANCER CENTER ONLY)
ALT: 12 U/L (ref 0–44)
AST: 18 U/L (ref 15–41)
Albumin: 4.2 g/dL (ref 3.5–5.0)
Alkaline Phosphatase: 52 U/L (ref 38–126)
Anion gap: 6 (ref 5–15)
BUN: 39 mg/dL — ABNORMAL HIGH (ref 8–23)
CO2: 27 mmol/L (ref 22–32)
Calcium: 9.4 mg/dL (ref 8.9–10.3)
Chloride: 103 mmol/L (ref 98–111)
Creatinine: 1.69 mg/dL — ABNORMAL HIGH (ref 0.61–1.24)
GFR, Estimated: 42 mL/min — ABNORMAL LOW (ref >60)
Glucose, Bld: 145 mg/dL — ABNORMAL HIGH (ref 70–99)
Potassium: 3.7 mmol/L (ref 3.5–5.1)
Sodium: 136 mmol/L (ref 135–145)
Total Bilirubin: 0.8 mg/dL (ref 0.3–1.2)
Total Protein: 7.1 g/dL (ref 6.5–8.1)

## 2021-11-09 MED ORDER — HEPARIN SOD (PORK) LOCK FLUSH 100 UNIT/ML IV SOLN
INTRAVENOUS | Status: AC
  Filled 2021-11-09: qty 5

## 2021-11-09 MED ORDER — SODIUM CHLORIDE 0.9% FLUSH
10.0000 mL | INTRAVENOUS | Status: DC | PRN
  Administered 2021-11-09: 10 mL

## 2021-11-09 MED ORDER — HEPARIN SOD (PORK) LOCK FLUSH 100 UNIT/ML IV SOLN
500.0000 [IU] | Freq: Once | INTRAVENOUS | Status: AC
  Administered 2021-11-09: 500 [IU] via INTRAVENOUS

## 2021-11-11 ENCOUNTER — Other Ambulatory Visit: Payer: Self-pay

## 2021-11-11 ENCOUNTER — Encounter: Payer: Self-pay | Admitting: Internal Medicine

## 2021-11-11 ENCOUNTER — Inpatient Hospital Stay (HOSPITAL_BASED_OUTPATIENT_CLINIC_OR_DEPARTMENT_OTHER): Payer: HMO | Admitting: Internal Medicine

## 2021-11-11 VITALS — BP 156/91 | HR 73 | Temp 97.6°F | Resp 18 | Wt 216.4 lb

## 2021-11-11 DIAGNOSIS — C3490 Malignant neoplasm of unspecified part of unspecified bronchus or lung: Secondary | ICD-10-CM

## 2021-11-11 DIAGNOSIS — C3411 Malignant neoplasm of upper lobe, right bronchus or lung: Secondary | ICD-10-CM

## 2021-11-11 DIAGNOSIS — Z923 Personal history of irradiation: Secondary | ICD-10-CM | POA: Diagnosis not present

## 2021-11-11 DIAGNOSIS — I130 Hypertensive heart and chronic kidney disease with heart failure and stage 1 through stage 4 chronic kidney disease, or unspecified chronic kidney disease: Secondary | ICD-10-CM | POA: Diagnosis not present

## 2021-11-11 DIAGNOSIS — I700 Atherosclerosis of aorta: Secondary | ICD-10-CM | POA: Diagnosis not present

## 2021-11-11 DIAGNOSIS — C3491 Malignant neoplasm of unspecified part of right bronchus or lung: Secondary | ICD-10-CM | POA: Diagnosis present

## 2021-11-11 DIAGNOSIS — E1122 Type 2 diabetes mellitus with diabetic chronic kidney disease: Secondary | ICD-10-CM | POA: Diagnosis not present

## 2021-11-11 DIAGNOSIS — I509 Heart failure, unspecified: Secondary | ICD-10-CM | POA: Diagnosis not present

## 2021-11-11 NOTE — Progress Notes (Signed)
?    [place] ?Telephone:(336) [phone]   [phone] ? ?OFFICE PROGRESS NOTE ? ?[name], MD ?[address] ?[place] [address] ? 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 ?PRIOR THERAPY:  ?1) Concurrent chemoradiation with weekly carboplatin for AUC of 2 and paclitaxel 45 mg/M2.  First dose 02/27/2018.  Status post 5 cycles. ?2) Consolidation immunotherapy with Imfinzi 10 mg/kg every 2 weeks.  First dose given on 05/17/2018.  Status post 18 cycles discontinued secondary to intolerance. ?  ?CURRENT THERAPY: Observation. ? ?INTERVAL HISTORY: ?Darrell [name] 76 y.o. male returns to the clinic today for 6 months follow-up visit accompanied by his wife.  The patient is feeling fine today with no concerning complaints.  He continues to exercise at regular basis.  He denied having any current chest pain, shortness of breath, cough or hemoptysis.  He has no nausea, vomiting, diarrhea or constipation.  He has no headache or visual changes.  He is here today for evaluation with repeat CT scan of the chest for restaging of his disease. ? ? ?MEDICAL HISTORY: ?Past Medical History:  ?Diagnosis Date  ? Aortic atherosclerosis ([place]) 07/18/2018  ? Cellulitis of right lower extremity   ? Chronic kidney disease   ? Diabetes mellitus without complication ([place])   ? Dyslipidemia   ? Hypertension   ? Prostate CA [facility]) dx'd 2012  ? rt lung ca dx'd 01/2018  ? Thoracic aortic aneurysm without rupture ([place]) 07/18/2018  ? ? ?ALLERGIES:  is allergic to lisinopril. ? ?MEDICATIONS:  ?Current Outpatient Medications  ?Medication Sig Dispense Refill  ? albuterol (PROVENTIL HFA;VENTOLIN HFA) 108 (90 Base) MCG/ACT inhaler Inhale 2 puffs into the lungs every 4 (four) hours as needed for wheezing or shortness of breath. 1 Inhaler 5  ? Ascorbic Acid  (VITAMIN C) 100 MG tablet Take 1 tablet by mouth daily.    ? ASPIRIN LOW DOSE 81 MG EC tablet Take 81 mg by mouth daily.    ? carvedilol (COREG) 3.125 MG tablet Take 3.125 mg by mouth 2 (two) times daily.    ? Cholecalciferol (VITAMIN D3) 10 MCG (400 UNIT) tablet Take by mouth.    ? fenofibrate 160 MG tablet Take 160 mg by mouth daily.     ? ferrous sulfate 325 (65 FE) MG tablet Take by mouth.    ? furosemide (LASIX) 40 MG tablet Take 1 tablet (40 mg total) by mouth 2 (two) times daily. Take additional 40 mg tablet/day if weight is up more than 3 lbs in a day or 5 lbs in 2 day. 60 tablet 0  ? hydrOXYzine (ATARAX/VISTARIL) 10 MG tablet Take 1 tablet (10 mg total) by mouth 3 (three) times daily as needed. 30 tablet 0  ? insulin detemir (LEVEMIR) 100 UNIT/ML FlexPen Inject 10 Units into the skin daily.     ? Insulin Pen Needle 31G X 8 MM MISC USE AS DIRECTED    ? lidocaine-prilocaine (EMLA) cream Apply 1 application topically as needed. 30 g 0  ? losartan (COZAAR) 50 MG tablet Take by mouth.    ? MULTIPLE VITAMINS ESSENTIAL PO Take 1 tablet by mouth daily. "With zinc"    ? Omega-3 1000 MG CAPS Take 1,200 mg by mouth 2 (two) times daily.     ? omeprazole (PRILOSEC) 40 MG capsule Take 40 mg by  mouth 2 (two) times daily.    ? ONETOUCH ULTRA test strip daily.    ? polyethylene glycol (MIRALAX / GLYCOLAX) 17 g packet Take 17 g by mouth daily. 14 each 0  ? pravastatin (PRAVACHOL) 40 MG tablet Take 40 mg by mouth daily.     ? senna-docusate (SENOKOT-S) 8.6-50 MG tablet Take 2 tablets by mouth 2 (two) times daily.    ? sildenafil (VIAGRA) 50 MG tablet Take by mouth.    ? TRADJENTA 5 MG TABS tablet Take 5 mg by mouth daily.    ? zinc gluconate 50 MG tablet Take by mouth.    ? ?No current facility-administered medications for this visit.  ? ?Facility-Administered Medications Ordered in Other Visits  ?Medication Dose Route Frequency Provider Last Rate Last Admin  ? sodium chloride flush (NS) 0.9 % injection 10 mL  10 mL  Intracatheter PRN Curt Bears, MD   10 mL at 06/28/18 1501  ? ? ?SURGICAL HISTORY:  ?Past Surgical History:  ?Procedure Laterality Date  ? IR GUIDED DRAIN W CATHETER PLACEMENT  11/15/2018  ? IR GUIDED DRAIN W CATHETER PLACEMENT  01/31/2019  ? IR INSTILL VIA CHEST TUBE AGENT FOR FIBRINOLYSIS INI DAY  01/23/2019  ? IR REMOVAL OF PLURAL CATH W/CUFF  01/31/2019  ? IR REMOVAL OF PLURAL CATH W/CUFF  05/08/2019  ? IR THORACENTESIS ASP PLEURAL SPACE W/IMG GUIDE  09/05/2018  ? IR THORACENTESIS ASP PLEURAL SPACE W/IMG GUIDE  10/11/2018  ? IR THORACENTESIS ASP PLEURAL SPACE W/IMG GUIDE  10/30/2018  ? PORTACATH PLACEMENT Left 02/21/2018  ? Procedure: INSERTION PORT-A-CATH;  Surgeon: Grace Isaac, MD;  Location[facility] OR;  Service: Thoracic;  Laterality: Left;  ? ? ?REVIEW OF SYSTEMS:  A comprehensive review of systems was negative.  ? ?PHYSICAL EXAMINATION: General appearance: alert, cooperative, and no distress ?Head: Normocephalic, without obvious abnormality, atraumatic ?Neck: no adenopathy, no JVD, supple, symmetrical, trachea midline, and thyroid not enlarged, symmetric, no tenderness/mass/nodules ?Lymph nodes: Cervical, supraclavicular, and axillary nodes normal. ?Resp: clear to auscultation bilaterally ?Back: symmetric, no curvature. ROM normal. No CVA tenderness. ?Cardio: regular rate and rhythm, S1, S2 normal, no murmur, click, rub or gallop ?GI: soft, non-tender; bowel sounds normal; no masses,  no organomegaly ?Extremities: extremities normal, atraumatic, no cyanosis or edema ? ?ECOG PERFORMANCE STATUS: 1 - Symptomatic but completely ambulatory ? ?Blood pressure (!) 156/91, pulse 73, temperature 97.6 ?F (36.4 ?C), temperature source Tympanic, resp. rate 18, weight 216 lb 6 oz (98.1 kg), SpO2 97 %. ? ?LABORATORY DATA: ?Lab Results  ?Component Value Date  ? WBC 4.0 11/09/2021  ? HGB 12.4 (L) 11/09/2021  ? HCT 37.6 (L) 11/09/2021  ? MCV 89.7 11/09/2021  ? PLT 158 11/09/2021  ? ? ?  Chemistry   ?   ?Component Value  Date/Time  ? NA 136 11/09/2021 0917  ? K 3.7 11/09/2021 0917  ? CL 103 11/09/2021 0917  ? CO2 27 11/09/2021 0917  ? BUN 39 (H) 11/09/2021 0917  ? CREATININE 1.69 (H) 11/09/2021 8182  ?    ?Component Value Date/Time  ? CALCIUM 9.4 11/09/2021 0917  ? ALKPHOS 52 11/09/2021 0917  ? AST 18 11/09/2021 0917  ? ALT 12 11/09/2021 0917  ? BILITOT 0.8 11/09/2021 0917  ?  ? ? ? ?RADIOGRAPHIC STUDIES: ?CT Chest Wo Contrast ? ?Result Date: 11/09/2021 ?CLINICAL DATA:  History of lung cancer, six-month follow-up. Completed treatment 2 years ago. Current no complaints. * Tracking Code: BO * Interval therapy since last imaging? No  Initial Cancer Diagnosis Date: 01/31/2018; Established by: Biopsy-proven Detailed Pathology: Stage IIb/IIIa non-small cell lung cancer, squamous cell carcinoma. Primary Tumor location: Right hilum. Surgeries: No. Chemotherapy: Yes; Ongoing? No; Most recent administration: 04/2018 Immunotherapy? Yes; Type: Imfinzi; Ongoing? No Radiation therapy? Yes; Date Range: 02/27/2018 - 04/13/2018; Target: Right lung Other Cancer Therapies: Prostate cancer 2012. EXAM: CT CHEST WITHOUT CONTRAST TECHNIQUE: Multidetector CT imaging of the chest was performed following the standard protocol without IV contrast. RADIATION DOSE REDUCTION: This exam was performed according to the departmental dose-optimization program which includes automated exposure control, adjustment of the mA and/or kV according to patient size and/or use of iterative reconstruction technique. COMPARISON:  Multiple priors including most recent CT May 13, 2021 FINDINGS: Cardiovascular: Accessed left chest Port-A-Cath with tip near the superior cavoatrial junction. Calcified aortic atherosclerosis and coronary artery calcifications. Similar aneurysmal dilation of the ascending aorta measuring 4.3 cm. Normal size heart. No significant pericardial effusion/thickening. Mediastinum/Nodes: Similar nodular enlargement of the right thyroid gland measuring up to  18 mm on image 25/3. No pathologically enlarged mediastinal, hilar or axillary lymph nodes, noting limited sensitivity for the detection of hilar adenopathy on this noncontrast study. Similar fullness of the right h

## 2022-02-01 ENCOUNTER — Other Ambulatory Visit: Payer: Self-pay

## 2022-02-09 ENCOUNTER — Other Ambulatory Visit: Payer: Self-pay

## 2022-04-26 ENCOUNTER — Other Ambulatory Visit: Payer: Self-pay

## 2022-04-29 ENCOUNTER — Telehealth: Payer: Self-pay | Admitting: Internal Medicine

## 2022-04-29 NOTE — Telephone Encounter (Signed)
Rescheduled 11/09 appointment due to provider pal, called patient regarding rescheduled appointment. Patient is notified.

## 2022-05-18 ENCOUNTER — Encounter (HOSPITAL_COMMUNITY): Payer: Self-pay

## 2022-05-18 ENCOUNTER — Inpatient Hospital Stay: Payer: HMO | Attending: Internal Medicine

## 2022-05-18 ENCOUNTER — Ambulatory Visit (HOSPITAL_COMMUNITY)
Admission: RE | Admit: 2022-05-18 | Discharge: 2022-05-18 | Disposition: A | Discharge status: Home / Self Care | Payer: HMO | Source: Ambulatory Visit | Attending: Internal Medicine | Admitting: Internal Medicine

## 2022-05-18 DIAGNOSIS — J432 Centrilobular emphysema: Secondary | ICD-10-CM | POA: Diagnosis not present

## 2022-05-18 DIAGNOSIS — C3490 Malignant neoplasm of unspecified part of unspecified bronchus or lung: Secondary | ICD-10-CM

## 2022-05-18 DIAGNOSIS — Z79899 Other long term (current) drug therapy: Secondary | ICD-10-CM | POA: Diagnosis not present

## 2022-05-18 DIAGNOSIS — E1122 Type 2 diabetes mellitus with diabetic chronic kidney disease: Secondary | ICD-10-CM | POA: Insufficient documentation

## 2022-05-18 DIAGNOSIS — C3411 Malignant neoplasm of upper lobe, right bronchus or lung: Secondary | ICD-10-CM | POA: Insufficient documentation

## 2022-05-18 DIAGNOSIS — Z7984 Long term (current) use of oral hypoglycemic drugs: Secondary | ICD-10-CM | POA: Insufficient documentation

## 2022-05-18 DIAGNOSIS — I129 Hypertensive chronic kidney disease with stage 1 through stage 4 chronic kidney disease, or unspecified chronic kidney disease: Secondary | ICD-10-CM | POA: Insufficient documentation

## 2022-05-18 DIAGNOSIS — I700 Atherosclerosis of aorta: Secondary | ICD-10-CM | POA: Diagnosis not present

## 2022-05-18 DIAGNOSIS — E041 Nontoxic single thyroid nodule: Secondary | ICD-10-CM | POA: Insufficient documentation

## 2022-05-18 DIAGNOSIS — J90 Pleural effusion, not elsewhere classified: Secondary | ICD-10-CM | POA: Insufficient documentation

## 2022-05-18 DIAGNOSIS — E785 Hyperlipidemia, unspecified: Secondary | ICD-10-CM | POA: Insufficient documentation

## 2022-05-18 DIAGNOSIS — Z7982 Long term (current) use of aspirin: Secondary | ICD-10-CM | POA: Insufficient documentation

## 2022-05-18 DIAGNOSIS — Z794 Long term (current) use of insulin: Secondary | ICD-10-CM | POA: Insufficient documentation

## 2022-05-18 LAB — CBC WITH DIFFERENTIAL (CANCER CENTER ONLY)
Abs Immature Granulocytes: 0.01 10*3/uL (ref 0.00–0.07)
Basophils Absolute: 0.1 10*3/uL (ref 0.0–0.1)
Basophils Relative: 2 %
Eosinophils Absolute: 0.2 10*3/uL (ref 0.0–0.5)
Eosinophils Relative: 4 %
HCT: 41.5 % (ref 39.0–52.0)
Hemoglobin: 13.8 g/dL (ref 13.0–17.0)
Immature Granulocytes: 0 %
Lymphocytes Relative: 17 %
Lymphs Abs: 0.8 10*3/uL (ref 0.7–4.0)
MCH: 30.7 pg (ref 26.0–34.0)
MCHC: 33.3 g/dL (ref 30.0–36.0)
MCV: 92.4 fL (ref 80.0–100.0)
Monocytes Absolute: 0.4 10*3/uL (ref 0.1–1.0)
Monocytes Relative: 8 %
Neutro Abs: 3.3 10*3/uL (ref 1.7–7.7)
Neutrophils Relative %: 69 %
Platelet Count: 180 10*3/uL (ref 150–400)
RBC: 4.49 MIL/uL (ref 4.22–5.81)
RDW: 14.5 % (ref 11.5–15.5)
WBC Count: 4.8 10*3/uL (ref 4.0–10.5)
nRBC: 0 % (ref 0.0–0.2)

## 2022-05-18 LAB — CMP (CANCER CENTER ONLY)
ALT: 35 U/L (ref 0–44)
AST: 48 U/L — ABNORMAL HIGH (ref 15–41)
Albumin: 4.2 g/dL (ref 3.5–5.0)
Alkaline Phosphatase: 67 U/L (ref 38–126)
Anion gap: 8 (ref 5–15)
BUN: 53 mg/dL — ABNORMAL HIGH (ref 8–23)
CO2: 27 mmol/L (ref 22–32)
Calcium: 9.9 mg/dL (ref 8.9–10.3)
Chloride: 102 mmol/L (ref 98–111)
Creatinine: 1.61 mg/dL — ABNORMAL HIGH (ref 0.61–1.24)
GFR, Estimated: 45 mL/min — ABNORMAL LOW (ref >60)
Glucose, Bld: 120 mg/dL — ABNORMAL HIGH (ref 70–99)
Potassium: 4 mmol/L (ref 3.5–5.1)
Sodium: 137 mmol/L (ref 135–145)
Total Bilirubin: 0.7 mg/dL (ref 0.3–1.2)
Total Protein: 7.5 g/dL (ref 6.5–8.1)

## 2022-05-20 ENCOUNTER — Ambulatory Visit: Payer: HMO | Admitting: Internal Medicine

## 2022-05-25 ENCOUNTER — Inpatient Hospital Stay (HOSPITAL_BASED_OUTPATIENT_CLINIC_OR_DEPARTMENT_OTHER): Payer: HMO | Admitting: Internal Medicine

## 2022-05-25 VITALS — BP 188/106 | HR 87 | Temp 97.9°F | Resp 16 | Wt 188.1 lb

## 2022-05-25 DIAGNOSIS — C3411 Malignant neoplasm of upper lobe, right bronchus or lung: Secondary | ICD-10-CM | POA: Diagnosis not present

## 2022-05-25 NOTE — Progress Notes (Signed)
[place] [phone]   [phone]) [phone]  OFFICE PROGRESS NOTE  [name], MD 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  First dose 02/27/2018.  Status post 5 cycles. 2) Consolidation immunotherapy with Imfinzi 10 mg/kg every 2 weeks.  First dose given on 05/17/2018.  Status post 18 cycles discontinued secondary to intolerance.   CURRENT THERAPY: Observation.  INTERVAL HISTORY: Darrell [name] 76 y.o. male returns to the clinic today for 12-month follow-up visit.  The patient is feeling fine today with no concerning complaints.  He denied having any significant chest pain, shortness of breath, cough or hemoptysis.  He has no nausea, vomiting, diarrhea or constipation.  He has no headache or visual changes.  He lost a lot of weight recently and he is currently of his blood pressure medication as well as his diabetic medications.  The patient is here today for evaluation and repeat CT scan of the chest for restaging of his disease.  He also has an appointment with his primary care physician immediately after his visit today.   MEDICAL HISTORY: Past Medical History:  Diagnosis Date   Aortic atherosclerosis ([place]) 07/18/2018   Cellulitis of right lower extremity    Chronic kidney disease    Diabetes mellitus without complication ([place])    Dyslipidemia    Hypertension    Prostate CA ([place]) dx'd 2012   rt lung ca dx'd 01/2018   Thoracic aortic aneurysm without rupture ([place]) 07/18/2018    ALLERGIES:  is allergic to lisinopril.  MEDICATIONS:  Current Outpatient Medications  Medication Sig Dispense Refill   albuterol (PROVENTIL HFA;VENTOLIN  HFA) 108 (90 Base) MCG/ACT inhaler Inhale 2 puffs into the lungs every 4 (four) hours as needed for wheezing or shortness of breath. 1 Inhaler 5   Ascorbic Acid (VITAMIN C) 100 MG tablet Take 1 tablet by mouth daily.     ASPIRIN LOW DOSE 81 MG EC tablet Take 81 mg by mouth daily.     carvedilol (COREG) 3.125 MG tablet Take 3.125 mg by mouth 2 (two) times daily.     Cholecalciferol (VITAMIN D3) 10 MCG (400 UNIT) tablet Take by mouth.     fenofibrate 160 MG tablet Take 160 mg by mouth daily.      ferrous sulfate 325 (65 FE) MG tablet Take by mouth.     furosemide (LASIX) 40 MG tablet Take 1 tablet (40 mg total) by mouth 2 (two) times daily. Take additional 40 mg tablet/day if weight is up more than 3 lbs in a day or 5 lbs in 2 day. 60 tablet 0   hydrOXYzine (ATARAX/VISTARIL) 10 MG tablet Take 1 tablet (10 mg total) by mouth 3 (three) times daily as needed. 30 tablet 0   insulin detemir (LEVEMIR) 100 UNIT/ML FlexPen Inject 10 Units into the skin daily.      Insulin Pen Needle 31G X 8 MM MISC USE AS DIRECTED     lidocaine-prilocaine (EMLA) cream Apply 1 application topically as needed. 30 g 0   losartan (COZAAR) 50 MG tablet Take by mouth.     MULTIPLE VITAMINS ESSENTIAL PO Take 1 tablet by mouth daily. "With zinc"  Omega-3 1000 MG CAPS Take 1,200 mg by mouth 2 (two) times daily.      omeprazole (PRILOSEC) 40 MG capsule Take 40 mg by mouth 2 (two) times daily.     ONETOUCH ULTRA test strip daily.     polyethylene glycol (MIRALAX / GLYCOLAX) 17 g packet Take 17 g by mouth daily. 14 each 0   pravastatin (PRAVACHOL) 40 MG tablet Take 40 mg by mouth daily.      senna-docusate (SENOKOT-S) 8.6-50 MG tablet Take 2 tablets by mouth 2 (two) times daily.     sildenafil (VIAGRA) 50 MG tablet Take by mouth.     TRADJENTA 5 MG TABS tablet Take 5 mg by mouth daily.     zinc gluconate 50 MG tablet Take by mouth.     No current facility-administered medications for this visit.   Facility-Administered  Medications Ordered in Other Visits  Medication Dose Route Frequency Provider Last Rate Last Admin   sodium chloride flush (NS) 0.9 % injection 10 mL  10 mL Intracatheter PRN Curt Bears, MD   10 mL at 06/28/18 1501    SURGICAL HISTORY:  Past Surgical History:  Procedure Laterality Date   IR GUIDED DRAIN W CATHETER PLACEMENT  11/15/2018   IR GUIDED DRAIN W CATHETER PLACEMENT  01/31/2019   IR INSTILL VIA CHEST TUBE AGENT FOR FIBRINOLYSIS INI DAY  01/23/2019   IR REMOVAL OF PLURAL CATH W/CUFF  01/31/2019   IR REMOVAL OF PLURAL CATH W/CUFF  05/08/2019   IR THORACENTESIS ASP PLEURAL SPACE W/IMG GUIDE  09/05/2018   IR THORACENTESIS ASP PLEURAL SPACE W/IMG GUIDE  10/11/2018   IR THORACENTESIS ASP PLEURAL SPACE W/IMG GUIDE  10/30/2018   PORTACATH PLACEMENT Left 02/21/2018   Procedure: INSERTION PORT-A-CATH;  Surgeon: Grace Isaac, MD;  Location: [facility] OR;  Service: Thoracic;  Laterality: Left;    REVIEW OF SYSTEMS:  A comprehensive review of systems was negative.   PHYSICAL EXAMINATION: General appearance: alert, cooperative, and no distress Head: Normocephalic, without obvious abnormality, atraumatic Neck: no adenopathy, no JVD, supple, symmetrical, trachea midline, and thyroid not enlarged, symmetric, no tenderness/mass/nodules Lymph nodes: Cervical, supraclavicular, and axillary nodes normal. Resp: clear to auscultation bilaterally Back: symmetric, no curvature. ROM normal. No CVA tenderness. Cardio: regular rate and rhythm, S1, S2 normal, no murmur, click, rub or gallop GI: soft, non-tender; bowel sounds normal; no masses,  no organomegaly Extremities: extremities normal, atraumatic, no cyanosis or edema  ECOG PERFORMANCE STATUS: 1 - Symptomatic but completely ambulatory  Blood pressure (!) 188/106, pulse 87, temperature 97.9 F (36.6 C), temperature source Oral, resp. rate 16, weight 188 lb 1 oz (85.3 kg), SpO2 100 %.  LABORATORY DATA: Lab Results  Component Value Date   WBC 4.8  05/18/2022   HGB 13.8 05/18/2022   HCT 41.5 05/18/2022   MCV 92.4 05/18/2022   PLT 180 05/18/2022      Chemistry      Component Value Date/Time   NA 137 05/18/2022 0905   K 4.0 05/18/2022 0905   CL 102 05/18/2022 0905   CO2 27 05/18/2022 0905   BUN 53 (H) 05/18/2022 0905   CREATININE 1.61 (H) 05/18/2022 0905      Component Value Date/Time   CALCIUM 9.9 05/18/2022 0905   ALKPHOS 67 05/18/2022 0905   AST 48 (H) 05/18/2022 0905   ALT 35 05/18/2022 0905   BILITOT 0.7 05/18/2022 0905       RADIOGRAPHIC STUDIES: CT Chest Wo Contrast  Result Date: 05/20/2022 CLINICAL DATA:  Stage IIIa non-small cell right lung cancer status post chemoradiation therapy and consolidation immunotherapy. Interval observation. Restaging. * Tracking Code: BO * EXAM: CT CHEST WITHOUT CONTRAST TECHNIQUE: Multidetector CT imaging of the chest was performed following the standard protocol without IV contrast. RADIATION DOSE REDUCTION: This exam was performed according to the departmental dose-optimization program which includes automated exposure control, adjustment of the mA and/or kV according to patient size and/or use of iterative reconstruction technique. COMPARISON:  11/09/2021 chest CT. FINDINGS: Cardiovascular: Normal heart size. No significant pericardial effusion/thickening. Three-vessel coronary atherosclerosis. Atherosclerotic thoracic aorta with dilated 4.4 cm ascending thoracic aorta. Left subclavian Port-A-Cath terminates in the lower third of the SVC. Dilated main pulmonary artery (3.6 matter diameter). Mediastinum/Nodes: Stable hypodense 1.7 cm right thyroid nodule. Unremarkable esophagus. No pathologically enlarged axillary, mediastinal or hilar lymph nodes, noting limited sensitivity for the detection of hilar adenopathy on this noncontrast study. Lungs/Pleura: No pneumothorax. Small loculated posterior apical and posterolateral basilar pleural effusions with associated smooth pleural thickening,  unchanged. No left pleural effusion. Mild centrilobular emphysema. Sharply marginated right perihilar lung consolidation with associated prominent volume loss, distortion and bronchiectasis, unchanged, compatible with radiation fibrosis. No acute consolidative airspace disease or significant pulmonary nodules. Upper abdomen: Cholecystectomy. Subcentimeter hypodense left liver dome lesion is too small to characterize and is unchanged, presumably benign. Simple 1.4 cm posterior upper left renal cyst, for which no follow-up imaging is recommended. Musculoskeletal: No aggressive appearing focal osseous lesions. Marked thoracic spondylosis. IMPRESSION: 1. 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 2. No evidence of metastatic disease in the chest. 3. Stable small loculated right pleural effusion with associated smooth pleural thickening. 4. Stable hypodense 1.7 cm right thyroid nodule. Recommend thyroid US (ref: J Am Coll Radiol. 2015 Feb;12(2): 143-50). 5. Chronic findings include: Three-vessel coronary atherosclerosis. Dilated main pulmonary artery, suggesting pulmonary arterial hypertension. Dilated 4.4 cm ascending thoracic aorta, unchanged. Recommend annual imaging followup by CTA or MRA. This recommendation follows 2010 ACCF/AHA/AATS/ACR/ASA/SCA/SCAI/SIR/STS/SVM Guidelines for the Diagnosis and Management of Patients with Thoracic Aortic Disease. Circulation. 2010; 121: E563-J497. Aortic aneurysm NOS (ICD10-I71.9) Aortic Atherosclerosis (ICD10-I70.0) and Emphysema (ICD10-J43.9). Electronically Signed   By: Ilona Sorrel M.D.   On: 05/20/2022 12:20     ASSESSMENT AND PLAN: This is a very pleasant 76 years old white male with a stage IIb/IIIa non-small cell lung cancer, squamous cell carcinoma diagnosed in July 2019. The patient is currently undergoing a course of concurrent chemoradiation with weekly carboplatin and paclitaxel status post 5 cycles.  He had  partial response after the initial induction treatment. The patient was started on treatment with consolidation Imfinzi status post 18 cycles.  The patient has been tolerating his treatment well except for the skin rash and itching.  He reaches the point that he could not sleep from the itching. He completed 18 cycles of this treatment.  The patient has a lot of other comorbidities especially the recent diagnosis with congestive heart failure. The patient has been in observation now for almost 4 years and he is doing fine. He had repeat CT scan of the chest performed recently.  I personally and independently reviewed the scan and discussed the results with the patient today. His scan showed no concerning findings for disease progression. I recommended for him to continue on observation with repeat CT scan of the chest in 6 months. For the hypertension, the patient was very anxious about his next appointment that coming very soon with his primary  care physician.  He is not currently on any blood pressure medication.  I strongly recommend for the patient to discuss with his primary care physician resuming his blood pressure medication again.  He has an appointment with him in less than 1 hour. The patient was advised to call immediately if he has any other concerning symptoms in the interval. The patient voices understanding of current disease status and treatment options and is in agreement with the current care plan. All questions were answered. The patient knows to call the clinic with any problems, questions or concerns. We can certainly see the patient much sooner if necessary.  Disclaimer: This note was dictated with voice recognition software. Similar sounding words can inadvertently be transcribed and may not be corrected upon review.

## 2022-12-22 ENCOUNTER — Encounter: Payer: Self-pay | Admitting: Internal Medicine

## 2022-12-23 ENCOUNTER — Other Ambulatory Visit: Payer: Self-pay | Admitting: Physician Assistant

## 2022-12-23 DIAGNOSIS — C3411 Malignant neoplasm of upper lobe, right bronchus or lung: Secondary | ICD-10-CM

## 2022-12-24 ENCOUNTER — Telehealth: Payer: Self-pay | Admitting: Internal Medicine

## 2022-12-24 NOTE — Telephone Encounter (Signed)
Scheduled per 06/13 scheduling message, patient has been called and notified. 

## 2023-01-10 ENCOUNTER — Encounter: Payer: Self-pay | Admitting: Internal Medicine

## 2023-01-11 ENCOUNTER — Encounter (HOSPITAL_COMMUNITY): Payer: Self-pay

## 2023-01-11 ENCOUNTER — Ambulatory Visit (HOSPITAL_COMMUNITY)
Admission: RE | Admit: 2023-01-11 | Discharge: 2023-01-11 | Disposition: A | Discharge status: Home / Self Care | Payer: HMO | Source: Ambulatory Visit | Attending: Physician Assistant | Admitting: Physician Assistant

## 2023-01-11 DIAGNOSIS — C3411 Malignant neoplasm of upper lobe, right bronchus or lung: Secondary | ICD-10-CM | POA: Diagnosis present

## 2023-01-19 ENCOUNTER — Other Ambulatory Visit: Payer: Self-pay | Admitting: *Deleted

## 2023-01-19 DIAGNOSIS — C3411 Malignant neoplasm of upper lobe, right bronchus or lung: Secondary | ICD-10-CM

## 2023-01-20 ENCOUNTER — Other Ambulatory Visit: Payer: Self-pay

## 2023-01-20 ENCOUNTER — Inpatient Hospital Stay: Payer: PPO | Attending: Internal Medicine

## 2023-01-20 ENCOUNTER — Inpatient Hospital Stay (HOSPITAL_BASED_OUTPATIENT_CLINIC_OR_DEPARTMENT_OTHER): Payer: PPO | Admitting: Internal Medicine

## 2023-01-20 VITALS — BP 139/78 | HR 66 | Temp 97.6°F | Resp 16 | Ht 68.0 in | Wt 178.6 lb

## 2023-01-20 DIAGNOSIS — I509 Heart failure, unspecified: Secondary | ICD-10-CM | POA: Insufficient documentation

## 2023-01-20 DIAGNOSIS — I700 Atherosclerosis of aorta: Secondary | ICD-10-CM | POA: Insufficient documentation

## 2023-01-20 DIAGNOSIS — C3490 Malignant neoplasm of unspecified part of unspecified bronchus or lung: Secondary | ICD-10-CM | POA: Diagnosis not present

## 2023-01-20 DIAGNOSIS — C3411 Malignant neoplasm of upper lobe, right bronchus or lung: Secondary | ICD-10-CM

## 2023-01-20 DIAGNOSIS — Z85118 Personal history of other malignant neoplasm of bronchus and lung: Secondary | ICD-10-CM | POA: Diagnosis not present

## 2023-01-20 DIAGNOSIS — Z923 Personal history of irradiation: Secondary | ICD-10-CM | POA: Diagnosis not present

## 2023-01-20 DIAGNOSIS — Z9221 Personal history of antineoplastic chemotherapy: Secondary | ICD-10-CM | POA: Insufficient documentation

## 2023-01-20 LAB — CBC WITH DIFFERENTIAL (CANCER CENTER ONLY)
Abs Immature Granulocytes: 0.01 10*3/uL (ref 0.00–0.07)
Basophils Absolute: 0.1 10*3/uL (ref 0.0–0.1)
Basophils Relative: 2 %
Eosinophils Absolute: 0.2 10*3/uL (ref 0.0–0.5)
Eosinophils Relative: 4 %
HCT: 41.5 % (ref 39.0–52.0)
Hemoglobin: 14 g/dL (ref 13.0–17.0)
Immature Granulocytes: 0 %
Lymphocytes Relative: 19 %
Lymphs Abs: 0.8 10*3/uL (ref 0.7–4.0)
MCH: 30.8 pg (ref 26.0–34.0)
MCHC: 33.7 g/dL (ref 30.0–36.0)
MCV: 91.2 fL (ref 80.0–100.0)
Monocytes Absolute: 0.4 10*3/uL (ref 0.1–1.0)
Monocytes Relative: 10 %
Neutro Abs: 2.6 10*3/uL (ref 1.7–7.7)
Neutrophils Relative %: 65 %
Platelet Count: 155 10*3/uL (ref 150–400)
RBC: 4.55 MIL/uL (ref 4.22–5.81)
RDW: 14.7 % (ref 11.5–15.5)
WBC Count: 4.1 10*3/uL (ref 4.0–10.5)
nRBC: 0 % (ref 0.0–0.2)

## 2023-01-20 LAB — CMP (CANCER CENTER ONLY)
ALT: 19 U/L (ref 0–44)
AST: 28 U/L (ref 15–41)
Albumin: 4 g/dL (ref 3.5–5.0)
Alkaline Phosphatase: 46 U/L (ref 38–126)
Anion gap: 7 (ref 5–15)
BUN: 56 mg/dL — ABNORMAL HIGH (ref 8–23)
CO2: 28 mmol/L (ref 22–32)
Calcium: 10 mg/dL (ref 8.9–10.3)
Chloride: 102 mmol/L (ref 98–111)
Creatinine: 1.58 mg/dL — ABNORMAL HIGH (ref 0.61–1.24)
GFR, Estimated: 46 mL/min — ABNORMAL LOW (ref >60)
Glucose, Bld: 126 mg/dL — ABNORMAL HIGH (ref 70–99)
Potassium: 3.8 mmol/L (ref 3.5–5.1)
Sodium: 137 mmol/L (ref 135–145)
Total Bilirubin: 0.9 mg/dL (ref 0.3–1.2)
Total Protein: 7.3 g/dL (ref 6.5–8.1)

## 2023-01-20 NOTE — Progress Notes (Signed)
[facility] Telephone:([phone]   Fax:(336) [phone]  OFFICE PROGRESS NOTE  Darrell [name] 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  First dose 02/27/2018.  Status post 5 cycles. 2) Consolidation immunotherapy with Imfinzi 10 mg/kg every 2 weeks.  First dose given on 05/17/2018.  Status post 18 cycles discontinued secondary to intolerance.   CURRENT THERAPY: Observation.  INTERVAL HISTORY: Darrell [name] 77 y.o. male returns to the clinic today for 6 months follow-up visit.  The patient is feeling fine today with no concerning complaints.  He exercises at regular basis at least 5 days a week.  He denied having any current chest pain, shortness of breath, cough or hemoptysis.  He has no nausea, vomiting, diarrhea or constipation.  He has no headache or visual changes.  He intentionally lost few pounds since his last visit.  The patient is here today for evaluation with repeat CT scan of the chest for restaging of his disease.  MEDICAL HISTORY: Past Medical History:  Diagnosis Date   Aortic atherosclerosis ([facility]) 07/18/2018   Cellulitis of right lower extremity    Chronic kidney disease    Diabetes mellitus without complication ([facility])    Dyslipidemia    Hypertension    Prostate CA ([facility]) dx'd 2012   rt lung ca dx'd 01/2018   Thoracic aortic aneurysm without rupture ([facility]) 07/18/2018    ALLERGIES:  is allergic to lisinopril.  MEDICATIONS:  Current Outpatient Medications  Medication Sig Dispense Refill   albuterol (PROVENTIL HFA;VENTOLIN HFA) 108 (90 Base) MCG/ACT inhaler Inhale 2 puffs into the lungs every 4 (four) hours as needed for wheezing  or shortness of breath. 1 Inhaler 5   Ascorbic Acid (VITAMIN C) 100 MG tablet Take 1 tablet by mouth daily.     ASPIRIN LOW DOSE 81 MG EC tablet Take 81 mg by mouth daily.     carvedilol (COREG) 3.125 MG tablet Take 3.125 mg by mouth 2 (two) times daily.     Cholecalciferol (VITAMIN D3) 10 MCG (400 UNIT) tablet Take by mouth.     fenofibrate 160 MG tablet Take 160 mg by mouth daily.      ferrous sulfate 325 (65 FE) MG tablet Take by mouth.     furosemide (LASIX) 40 MG tablet Take 1 tablet (40 mg total) by mouth 2 (two) times daily. Take additional 40 mg tablet/day if weight is up more than 3 lbs in a day or 5 lbs in 2 day. 60 tablet 0   hydrOXYzine (ATARAX/VISTARIL) 10 MG tablet Take 1 tablet (10 mg total) by mouth 3 (three) times daily as needed. 30 tablet 0   insulin detemir (LEVEMIR) 100 UNIT/ML FlexPen Inject 10 Units into the skin daily.      Insulin Pen Needle 31G X 8 MM MISC USE AS DIRECTED     lidocaine-prilocaine (EMLA) cream Apply 1 application topically as needed. 30 g 0   losartan (COZAAR) 50 MG tablet Take by mouth.     MULTIPLE VITAMINS ESSENTIAL PO Take 1 tablet by mouth daily. "With zinc"     Omega-3 1000 MG CAPS Take 1,200 mg by mouth 2 (two) times daily.  omeprazole (PRILOSEC) 40 MG capsule Take 40 mg by mouth 2 (two) times daily.     ONETOUCH ULTRA test strip daily.     polyethylene glycol (MIRALAX / GLYCOLAX) 17 g packet Take 17 g by mouth daily. 14 each 0   pravastatin (PRAVACHOL) 40 MG tablet Take 40 mg by mouth daily.      senna-docusate (SENOKOT-S) 8.6-50 MG tablet Take 2 tablets by mouth 2 (two) times daily.     sildenafil (VIAGRA) 50 MG tablet Take by mouth.     TRADJENTA 5 MG TABS tablet Take 5 mg by mouth daily.     zinc gluconate 50 MG tablet Take by mouth.     No current facility-administered medications for this visit.   Facility-Administered Medications Ordered in Other Visits  Medication Dose Route Frequency Provider Last Rate Last Admin   sodium  chloride flush (NS) 0.9 % injection 10 mL  10 mL Intracatheter PRN Si Gaul, MD   10 mL at 06/28/18 1501    SURGICAL HISTORY:  Past Surgical History:  Procedure Laterality Date   IR GUIDED DRAIN W CATHETER PLACEMENT  11/15/2018   IR GUIDED DRAIN W CATHETER PLACEMENT  01/31/2019   IR INSTILL VIA CHEST TUBE AGENT FOR FIBRINOLYSIS INI DAY  01/23/2019   IR REMOVAL OF PLURAL CATH W/CUFF  01/31/2019   IR REMOVAL OF PLURAL CATH W/CUFF  05/08/2019   IR THORACENTESIS ASP PLEURAL SPACE W/IMG GUIDE  09/05/2018   IR THORACENTESIS ASP PLEURAL SPACE W/IMG GUIDE  10/11/2018   IR THORACENTESIS ASP PLEURAL SPACE W/IMG GUIDE  10/30/2018   PORTACATH PLACEMENT Left 02/21/2018   Procedure: INSERTION PORT-A-CATH;  Surgeon: Delight Ovens, MD;  Location: [facility] OR;  Service: Thoracic;  Laterality: Left;    REVIEW OF SYSTEMS:  A comprehensive review of systems was negative.   PHYSICAL EXAMINATION: General appearance: alert, cooperative, and no distress Head: Normocephalic, without obvious abnormality, atraumatic Neck: no adenopathy, no JVD, supple, symmetrical, trachea midline, and thyroid not enlarged, symmetric, no tenderness/mass/nodules Lymph nodes: Cervical, supraclavicular, and axillary nodes normal. Resp: clear to auscultation bilaterally Back: symmetric, no curvature. ROM normal. No CVA tenderness. Cardio: regular rate and rhythm, S1, S2 normal, no murmur, click, rub or gallop GI: soft, non-tender; bowel sounds normal; no masses,  no organomegaly Extremities: extremities normal, atraumatic, no cyanosis or edema  ECOG PERFORMANCE STATUS: 1 - Symptomatic but completely ambulatory  Blood pressure 139/78, pulse 66, temperature 97.6 F (36.4 C), temperature source Oral, resp. rate 16, height 5\' 8"  (1.727 m), weight 178 lb 9.6 oz (81 kg), SpO2 100%.  LABORATORY DATA: Lab Results  Component Value Date   WBC 4.1 01/20/2023   HGB 14.0 01/20/2023   HCT 41.5 01/20/2023   MCV 91.2 01/20/2023   PLT 155  01/20/2023      Chemistry      Component Value Date/Time   NA 137 01/20/2023 0823   K 3.8 01/20/2023 0823   CL 102 01/20/2023 0823   CO2 28 01/20/2023 0823   BUN 56 (H) 01/20/2023 0823   CREATININE 1.58 (H) 01/20/2023 0823      Component Value Date/Time   CALCIUM 10.0 01/20/2023 0823   ALKPHOS 46 01/20/2023 0823   AST 28 01/20/2023 0823   ALT 19 01/20/2023 0823   BILITOT 0.9 01/20/2023 0823       RADIOGRAPHIC STUDIES: CT Chest Wo Contrast  Result Date: 01/14/2023 CLINICAL DATA:  Non-small cell right lung cancer status post chemoradiation therapy and consolidation immunotherapy. Restaging. Additional history  of prostate cancer. * Tracking Code: BO * EXAM: CT CHEST WITHOUT CONTRAST TECHNIQUE: Multidetector CT imaging of the chest was performed following the standard protocol without IV contrast. RADIATION DOSE REDUCTION: This exam was performed according to the departmental dose-optimization program which includes automated exposure control, adjustment of the mA and/or kV according to patient size and/or use of iterative reconstruction technique. COMPARISON:  05/18/2022 chest CT. FINDINGS: Cardiovascular: Normal heart size. No significant pericardial effusion/thickening. Three-vessel coronary atherosclerosis. Left subclavian Port-A-Cath terminates at the cavoatrial junction. Atherosclerotic thoracic aorta with stable dilated 4.4 cm ascending thoracic aorta. Stable dilated main pulmonary artery (3.6 cm diameter). Mediastinum/Nodes: Hypodense 1.8 cm inferior right thyroid nodule, not appreciably changed. Unremarkable esophagus. No pathologically enlarged axillary, mediastinal or hilar lymph nodes, noting limited sensitivity for the detection of hilar adenopathy on this noncontrast study. Lungs/Pleura: No pneumothorax. Loculated small posterior right pleural effusion with smooth right pleural thickening, unchanged. No left pleural effusion. Sharply marginated right perihilar lung consolidation  with associated bronchiectasis, volume loss and distortion, unchanged and compatible with radiation fibrosis. No acute consolidative airspace disease or lung masses. Small anterior left lower lobe perifissural pulmonary nodules are stable and considered benign. Stable tiny calcified 2 mm medial right middle lobe granuloma. No new significant pulmonary nodules. Upper abdomen: Cholecystectomy. Subcentimeter hypodense liver dome lesion is too small to characterize and unchanged, considered benign. Simple 1.8 cm posterior upper left renal cyst, for which no follow-up imaging is recommended. Musculoskeletal: No aggressive appearing focal osseous lesions. Healed posterior left sixth through eighth rib deformities. Moderate thoracic spondylosis with mildly exaggerated thoracic kyphosis. IMPRESSION: 1. 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 2. No evidence of metastatic disease in the chest. 3. Stable loculated small posterior right pleural effusion with smooth right pleural thickening. 4. Stable dilated main pulmonary artery, suggesting chronic pulmonary arterial hypertension. 5. Three-vessel coronary atherosclerosis. 6. Stable dilated 4.4 cm ascending thoracic aorta. Recommend annual imaging followup by CTA or MRA. This recommendation follows 2010 ACCF/AHA/AATS/ACR/ASA/SCA/SCAI/SIR/STS/SVM Guidelines for the Diagnosis and Management of Patients with Thoracic Aortic Disease. Circulation. 2010; 121: Z610-R604. Aortic aneurysm NOS (ICD10-I71.9). 7. Hypodense 1.8 cm inferior right thyroid nodule, not appreciably changed. Recommend thyroid US (ref: J Am Coll Radiol. 2015 Feb;12(2): 143-50). 8.  Aortic Atherosclerosis (ICD10-I70.0). Electronically Signed   By: Delbert Phenix M.D.   On: 01/14/2023 12:00     ASSESSMENT AND PLAN: This is a very pleasant 77 years old white male with a stage IIb/IIIa non-small cell lung cancer, squamous cell carcinoma diagnosed in July  2019. The patient is currently undergoing a course of concurrent chemoradiation with weekly carboplatin and paclitaxel status post 5 cycles.  He had partial response after the initial induction treatment. The patient was started on treatment with consolidation Imfinzi status post 18 cycles.  The patient has been tolerating his treatment well except for the skin rash and itching.  He reaches the point that he could not sleep from the itching. He completed 18 cycles of this treatment.  The patient has a lot of other comorbidities especially the recent diagnosis with congestive heart failure. The patient is currently on observation and he is feeling fine with no concerning complaints. He had repeat CT scan of the chest performed recently.  I personally and independently reviewed the scan and discussed the result with the patient today. His scan showed no concerning findings for disease recurrence or metastasis.  He continues to have coronary atherosclerosis and he is followed by  his primary care physician and cardiology. I will see the patient back for follow-up visit in 9 months with repeat CT scan of the chest. He would like to have the Port-A-Cath removed and I will refer him to interventional radiology for this procedure. The patient was advised to call immediately if he has any other concerning symptoms in the interval. The patient voices understanding of current disease status and treatment options and is in agreement with the current care plan. All questions were answered. The patient knows to call the clinic with any problems, questions or concerns. We can certainly see the patient much sooner if necessary.  Disclaimer: This note was dictated with voice recognition software. Similar sounding words can inadvertently be transcribed and may not be corrected upon review.

## 2023-01-31 ENCOUNTER — Other Ambulatory Visit: Payer: Self-pay | Admitting: Internal Medicine

## 2023-01-31 ENCOUNTER — Ambulatory Visit (HOSPITAL_COMMUNITY)
Admission: RE | Admit: 2023-01-31 | Discharge: 2023-01-31 | Disposition: A | Discharge status: Home / Self Care | Payer: HMO | Source: Ambulatory Visit | Attending: Internal Medicine | Admitting: Internal Medicine

## 2023-01-31 ENCOUNTER — Encounter: Payer: Self-pay | Admitting: Internal Medicine

## 2023-01-31 DIAGNOSIS — C3490 Malignant neoplasm of unspecified part of unspecified bronchus or lung: Secondary | ICD-10-CM | POA: Diagnosis not present

## 2023-01-31 DIAGNOSIS — Z452 Encounter for adjustment and management of vascular access device: Secondary | ICD-10-CM | POA: Insufficient documentation

## 2023-01-31 HISTORY — PX: IR REMOVAL TUN ACCESS W/ PORT W/O FL MOD SED: IMG2290

## 2023-01-31 HISTORY — PX: IR CHEST FLUORO: IMG2383

## 2023-01-31 MED ORDER — LIDOCAINE-EPINEPHRINE 1 %-1:100000 IJ SOLN
INTRAMUSCULAR | Status: AC
  Filled 2023-01-31: qty 1

## 2023-01-31 MED ORDER — LIDOCAINE-EPINEPHRINE 1 %-1:100000 IJ SOLN
10.0000 mL | Freq: Once | INTRAMUSCULAR | Status: AC
  Administered 2023-01-31: 10 mL via INTRADERMAL

## 2023-10-13 ENCOUNTER — Ambulatory Visit (HOSPITAL_COMMUNITY)
Admission: RE | Admit: 2023-10-13 | Discharge: 2023-10-13 | Disposition: A | Discharge status: Home / Self Care | Source: Ambulatory Visit | Attending: Internal Medicine | Admitting: Internal Medicine

## 2023-10-13 ENCOUNTER — Inpatient Hospital Stay: Payer: HMO | Attending: Internal Medicine

## 2023-10-13 ENCOUNTER — Other Ambulatory Visit: Payer: Self-pay

## 2023-10-13 DIAGNOSIS — C3490 Malignant neoplasm of unspecified part of unspecified bronchus or lung: Secondary | ICD-10-CM

## 2023-10-13 DIAGNOSIS — Z79899 Other long term (current) drug therapy: Secondary | ICD-10-CM | POA: Insufficient documentation

## 2023-10-13 DIAGNOSIS — Z9221 Personal history of antineoplastic chemotherapy: Secondary | ICD-10-CM | POA: Insufficient documentation

## 2023-10-13 DIAGNOSIS — E119 Type 2 diabetes mellitus without complications: Secondary | ICD-10-CM | POA: Insufficient documentation

## 2023-10-13 DIAGNOSIS — Z7984 Long term (current) use of oral hypoglycemic drugs: Secondary | ICD-10-CM | POA: Insufficient documentation

## 2023-10-13 DIAGNOSIS — N189 Chronic kidney disease, unspecified: Secondary | ICD-10-CM | POA: Insufficient documentation

## 2023-10-13 DIAGNOSIS — Z7982 Long term (current) use of aspirin: Secondary | ICD-10-CM | POA: Insufficient documentation

## 2023-10-13 DIAGNOSIS — Z8546 Personal history of malignant neoplasm of prostate: Secondary | ICD-10-CM | POA: Insufficient documentation

## 2023-10-13 DIAGNOSIS — C3411 Malignant neoplasm of upper lobe, right bronchus or lung: Secondary | ICD-10-CM | POA: Insufficient documentation

## 2023-10-13 DIAGNOSIS — I700 Atherosclerosis of aorta: Secondary | ICD-10-CM | POA: Insufficient documentation

## 2023-10-13 DIAGNOSIS — I712 Thoracic aortic aneurysm, without rupture, unspecified: Secondary | ICD-10-CM | POA: Insufficient documentation

## 2023-10-13 DIAGNOSIS — Z923 Personal history of irradiation: Secondary | ICD-10-CM | POA: Insufficient documentation

## 2023-10-13 DIAGNOSIS — Z794 Long term (current) use of insulin: Secondary | ICD-10-CM | POA: Insufficient documentation

## 2023-10-13 DIAGNOSIS — R059 Cough, unspecified: Secondary | ICD-10-CM | POA: Insufficient documentation

## 2023-10-13 DIAGNOSIS — E785 Hyperlipidemia, unspecified: Secondary | ICD-10-CM | POA: Insufficient documentation

## 2023-10-13 DIAGNOSIS — I129 Hypertensive chronic kidney disease with stage 1 through stage 4 chronic kidney disease, or unspecified chronic kidney disease: Secondary | ICD-10-CM | POA: Insufficient documentation

## 2023-10-13 LAB — CBC WITH DIFFERENTIAL (CANCER CENTER ONLY)
Abs Immature Granulocytes: 0.02 10*3/uL (ref 0.00–0.07)
Basophils Absolute: 0.1 10*3/uL (ref 0.0–0.1)
Basophils Relative: 2 %
Eosinophils Absolute: 0.1 10*3/uL (ref 0.0–0.5)
Eosinophils Relative: 3 %
HCT: 37.9 % — ABNORMAL LOW (ref 39.0–52.0)
Hemoglobin: 12.8 g/dL — ABNORMAL LOW (ref 13.0–17.0)
Immature Granulocytes: 0 %
Lymphocytes Relative: 14 %
Lymphs Abs: 0.7 10*3/uL (ref 0.7–4.0)
MCH: 29.8 pg (ref 26.0–34.0)
MCHC: 33.8 g/dL (ref 30.0–36.0)
MCV: 88.1 fL (ref 80.0–100.0)
Monocytes Absolute: 0.4 10*3/uL (ref 0.1–1.0)
Monocytes Relative: 9 %
Neutro Abs: 3.7 10*3/uL (ref 1.7–7.7)
Neutrophils Relative %: 72 %
Platelet Count: 198 10*3/uL (ref 150–400)
RBC: 4.3 MIL/uL (ref 4.22–5.81)
RDW: 13.9 % (ref 11.5–15.5)
WBC Count: 5 10*3/uL (ref 4.0–10.5)
nRBC: 0 % (ref 0.0–0.2)

## 2023-10-13 LAB — CMP (CANCER CENTER ONLY)
ALT: 9 U/L (ref 0–44)
AST: 17 U/L (ref 15–41)
Albumin: 3.9 g/dL (ref 3.5–5.0)
Alkaline Phosphatase: 46 U/L (ref 38–126)
Anion gap: 6 (ref 5–15)
BUN: 56 mg/dL — ABNORMAL HIGH (ref 8–23)
CO2: 27 mmol/L (ref 22–32)
Calcium: 9.6 mg/dL (ref 8.9–10.3)
Chloride: 102 mmol/L (ref 98–111)
Creatinine: 1.98 mg/dL — ABNORMAL HIGH (ref 0.61–1.24)
GFR, Estimated: 35 mL/min — ABNORMAL LOW (ref >60)
Glucose, Bld: 139 mg/dL — ABNORMAL HIGH (ref 70–99)
Potassium: 3.9 mmol/L (ref 3.5–5.1)
Sodium: 135 mmol/L (ref 135–145)
Total Bilirubin: 0.7 mg/dL (ref 0.0–1.2)
Total Protein: 7.4 g/dL (ref 6.5–8.1)

## 2023-10-17 ENCOUNTER — Inpatient Hospital Stay (HOSPITAL_BASED_OUTPATIENT_CLINIC_OR_DEPARTMENT_OTHER): Payer: HMO | Admitting: Internal Medicine

## 2023-10-17 VITALS — BP 168/84 | HR 72 | Temp 98.0°F | Resp 17 | Ht 68.0 in | Wt 185.8 lb

## 2023-10-17 DIAGNOSIS — Z794 Long term (current) use of insulin: Secondary | ICD-10-CM | POA: Diagnosis not present

## 2023-10-17 DIAGNOSIS — N189 Chronic kidney disease, unspecified: Secondary | ICD-10-CM | POA: Diagnosis not present

## 2023-10-17 DIAGNOSIS — Z7984 Long term (current) use of oral hypoglycemic drugs: Secondary | ICD-10-CM | POA: Diagnosis not present

## 2023-10-17 DIAGNOSIS — E119 Type 2 diabetes mellitus without complications: Secondary | ICD-10-CM | POA: Diagnosis not present

## 2023-10-17 DIAGNOSIS — I129 Hypertensive chronic kidney disease with stage 1 through stage 4 chronic kidney disease, or unspecified chronic kidney disease: Secondary | ICD-10-CM | POA: Diagnosis not present

## 2023-10-17 DIAGNOSIS — Z7982 Long term (current) use of aspirin: Secondary | ICD-10-CM | POA: Diagnosis not present

## 2023-10-17 DIAGNOSIS — R059 Cough, unspecified: Secondary | ICD-10-CM | POA: Diagnosis not present

## 2023-10-17 DIAGNOSIS — C3490 Malignant neoplasm of unspecified part of unspecified bronchus or lung: Secondary | ICD-10-CM | POA: Diagnosis not present

## 2023-10-17 DIAGNOSIS — Z79899 Other long term (current) drug therapy: Secondary | ICD-10-CM | POA: Diagnosis not present

## 2023-10-17 DIAGNOSIS — E785 Hyperlipidemia, unspecified: Secondary | ICD-10-CM | POA: Diagnosis not present

## 2023-10-17 DIAGNOSIS — C3411 Malignant neoplasm of upper lobe, right bronchus or lung: Secondary | ICD-10-CM | POA: Diagnosis present

## 2023-10-17 DIAGNOSIS — I712 Thoracic aortic aneurysm, without rupture, unspecified: Secondary | ICD-10-CM | POA: Diagnosis not present

## 2023-10-17 DIAGNOSIS — Z923 Personal history of irradiation: Secondary | ICD-10-CM | POA: Diagnosis not present

## 2023-10-17 DIAGNOSIS — I700 Atherosclerosis of aorta: Secondary | ICD-10-CM | POA: Diagnosis not present

## 2023-10-17 DIAGNOSIS — Z9221 Personal history of antineoplastic chemotherapy: Secondary | ICD-10-CM | POA: Diagnosis not present

## 2023-10-17 DIAGNOSIS — Z8546 Personal history of malignant neoplasm of prostate: Secondary | ICD-10-CM | POA: Diagnosis not present

## 2023-10-17 MED ORDER — DOXYCYCLINE HYCLATE 100 MG PO TABS: 100.0000 mg | ORAL_TABLET | Freq: Two times a day (BID) | ORAL | 0 refills | Status: AC

## 2023-10-17 NOTE — Progress Notes (Signed)
 [facility] [phone]   [phone]  OFFICE PROGRESS NOTE  Darrell [name] 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  First dose 02/27/2018.  Status post 5 cycles. 2) Consolidation immunotherapy with Imfinzi 10 mg/kg every 2 weeks.  First dose given on 05/17/2018.  Status post 18 cycles discontinued secondary to intolerance.   CURRENT THERAPY: Observation.  INTERVAL HISTORY: Darrell [name] 78 y.o. male returns to the clinic today for follow-up visit accompanied by his wife.Discussed the use of AI scribe software for clinical note transcription with the patient, who gave verbal consent to proceed.  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 He is accompanied by his wife.  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 He underwent concurrent chemoradiation followed by 18 cycles of durvalumab every two weeks. He has been under observation for several years.  In January, he experienced symptoms suggestive of pneumonia and was prescribed a course of amoxicillin and azithromycin. Despite completing the antibiotics, he continues to experience congestion in the right side of his chest and has had an intermittent cough since then. He also used cough drops, which did not alleviate the symptoms.  No current fever or chills. His Port A Cath has been removed. He uses [facility] on [address] in [place] for his pharmacy needs.        MEDICAL  HISTORY: Past Medical History:  Diagnosis Date   Aortic atherosclerosis (HCC) 07/18/2018   Cellulitis of right lower extremity    Chronic kidney disease    Diabetes mellitus without complication (HCC)    Dyslipidemia    Hypertension    Prostate CA (HCC) dx'd 2012   rt lung ca dx'd 01/2018   Thoracic aortic aneurysm without rupture (HCC) 07/18/2018    ALLERGIES:  is allergic to lisinopril.  MEDICATIONS:  Current Outpatient Medications  Medication Sig Dispense Refill   albuterol (PROVENTIL HFA;VENTOLIN HFA) 108 (90 Base) MCG/ACT inhaler Inhale 2 puffs into the lungs every 4 (four) hours as needed for wheezing or shortness of breath. 1 Inhaler 5   Ascorbic Acid (VITAMIN C) 100 MG tablet Take 1 tablet by mouth daily.     ASPIRIN LOW DOSE 81 MG EC tablet Take 81 mg by mouth daily.     carvedilol (COREG) 3.125 MG tablet Take 3.125 mg by mouth 2 (two) times daily.     Cholecalciferol (VITAMIN D3) 10 MCG (400 UNIT) tablet Take by mouth.     fenofibrate 160 MG tablet Take 160 mg by mouth daily.      ferrous sulfate 325 (65 FE) MG tablet Take by mouth.     furosemide (LASIX) 20 MG tablet Take 20 mg by mouth daily. Take an additional tablet if weight is  up more than 3 lbs in a day or 5 lbs in 2 days.     hydrOXYzine (ATARAX/VISTARIL) 10 MG tablet Take 1 tablet (10 mg total) by mouth 3 (three) times daily as needed. 30 tablet  0   insulin detemir (LEVEMIR) 100 UNIT/ML FlexPen Inject 10 Units into the skin daily.      Insulin Pen Needle 31G X 8 MM MISC USE AS DIRECTED     lidocaine-prilocaine (EMLA) cream Apply 1 application topically as needed. 30 g 0   losartan (COZAAR) 50 MG tablet Take by mouth.     MULTIPLE VITAMINS ESSENTIAL PO Take 1 tablet by mouth daily. "With zinc"     Omega-3 1000 MG CAPS Take 1,200 mg by mouth 2 (two) times daily.      omeprazole (PRILOSEC) 40 MG capsule Take 40 mg by mouth 2 (two) times daily.     ONETOUCH ULTRA test strip daily.     polyethylene glycol (MIRALAX /  GLYCOLAX) 17 g packet Take 17 g by mouth daily. 14 each 0   pravastatin (PRAVACHOL) 40 MG tablet Take 40 mg by mouth daily.      senna-docusate (SENOKOT-S) 8.6-50 MG tablet Take 2 tablets by mouth 2 (two) times daily.     sildenafil (VIAGRA) 50 MG tablet Take by mouth.     TRADJENTA 5 MG TABS tablet Take 5 mg by mouth daily.     zinc gluconate 50 MG tablet Take by mouth.     No current facility-administered medications for this visit.   Facility-Administered Medications Ordered in Other Visits  Medication Dose Route Frequency Provider Last Rate Last Admin   sodium chloride flush (NS) 0.9 % injection 10 mL  10 mL Intracatheter PRN Si Gaul, MD   10 mL at 06/28/18 1501    SURGICAL HISTORY:  Past Surgical History:  Procedure Laterality Date   IR CHEST FLUORO  01/31/2023   IR GUIDED DRAIN W CATHETER PLACEMENT  11/15/2018   IR GUIDED DRAIN W CATHETER PLACEMENT  01/31/2019   IR INSTILL VIA CHEST TUBE AGENT FOR FIBRINOLYSIS INI DAY  01/23/2019   IR REMOVAL OF PLURAL CATH W/CUFF  01/31/2019   IR REMOVAL OF PLURAL CATH W/CUFF  05/08/2019   IR REMOVAL TUN ACCESS W/ PORT W/O FL MOD SED  01/31/2023   IR THORACENTESIS ASP PLEURAL SPACE W/IMG GUIDE  09/05/2018   IR THORACENTESIS ASP PLEURAL SPACE W/IMG GUIDE  10/11/2018   IR THORACENTESIS ASP PLEURAL SPACE W/IMG GUIDE  10/30/2018   PORTACATH PLACEMENT Left 02/21/2018   Procedure: INSERTION PORT-A-CATH;  Surgeon: Delight Ovens, MD;  Location: [facility] OR;  Service: Thoracic;  Laterality: Left;    REVIEW OF SYSTEMS:  Constitutional: negative Eyes: negative Ears, nose, mouth, throat, and face: negative Respiratory: positive for cough and sputum Cardiovascular: negative Gastrointestinal: negative Genitourinary:negative Integument/breast: negative Hematologic/lymphatic: negative Musculoskeletal:negative Neurological: negative Behavioral/Psych: negative Endocrine: negative Allergic/Immunologic: negative   PHYSICAL EXAMINATION: General  appearance: alert, cooperative, and no distress Head: Normocephalic, without obvious abnormality, atraumatic Neck: no adenopathy, no JVD, supple, symmetrical, trachea midline, and thyroid not enlarged, symmetric, no tenderness/mass/nodules Lymph nodes: Cervical, supraclavicular, and axillary nodes normal. Resp: clear to auscultation bilaterally Back: symmetric, no curvature. ROM normal. No CVA tenderness. Cardio: regular rate and rhythm, S1, S2 normal, no murmur, click, rub or gallop GI: soft, non-tender; bowel sounds normal; no masses,  no organomegaly Extremities: extremities normal, atraumatic, no cyanosis or edema Neurologic: Alert and oriented X 3, normal strength and tone. Normal symmetric reflexes. Normal coordination and gait  ECOG PERFORMANCE STATUS: 1 - Symptomatic but completely ambulatory  Blood pressure (!) 168/84, pulse 72, temperature 98 F (36.7 C), temperature source Temporal, resp. rate 17, height 5\' 8"  (1.727 m), weight 185 lb 12.8  oz (84.3 kg), SpO2 98%.  LABORATORY DATA: Lab Results  Component Value Date   WBC 5.0 10/13/2023   HGB 12.8 (L) 10/13/2023   HCT 37.9 (L) 10/13/2023   MCV 88.1 10/13/2023   PLT 198 10/13/2023      Chemistry      Component Value Date/Time   NA 135 10/13/2023 0942   K 3.9 10/13/2023 0942   CL 102 10/13/2023 0942   CO2 27 10/13/2023 0942   BUN 56 (H) 10/13/2023 0942   CREATININE 1.98 (H) 10/13/2023 0942      Component Value Date/Time   CALCIUM 9.6 10/13/2023 0942   ALKPHOS 46 10/13/2023 0942   AST 17 10/13/2023 0942   ALT 9 10/13/2023 0942   BILITOT 0.7 10/13/2023 0942       RADIOGRAPHIC STUDIES: CT Chest Wo Contrast Result Date: 10/15/2023 CLINICAL DATA:  Non-small-cell lung cancer. Restaging. * Tracking Code: BO * EXAM: CT CHEST WITHOUT CONTRAST TECHNIQUE: Multidetector CT imaging of the chest was performed following the standard protocol without IV contrast. RADIATION DOSE REDUCTION: This exam was performed according to the  departmental dose-optimization program which includes automated exposure control, adjustment of the mA and/or kV according to patient size and/or use of iterative reconstruction technique. COMPARISON:  01/11/2023 FINDINGS: Cardiovascular: The heart size is normal. No substantial pericardial effusion. Coronary artery calcification is evident. Moderate atherosclerotic calcification is noted in the wall of the thoracic aorta. Ascending thoracic aorta measures 4.3 cm diameter. Mediastinum/Nodes: No mediastinal lymphadenopathy. No evidence for gross hilar lymphadenopathy although assessment is limited by the lack of intravenous contrast on the current study. The esophagus has normal imaging features. There is no axillary lymphadenopathy. Lungs/Pleura: Volume loss and scarring in the suprahilar right lung and parahilar right lung is similar to prior and compatible with scarring from radiation therapy. Since the prior study, there is new patchy airspace disease in the posterior right lung (see image 84/series 7). Loculated pleural fluid in the posterior right apex is similar to prior as is the chronic fluid/pleural thickening in the posterior right lower hemithorax. No new suspicious pulmonary nodule or mass in the left lung. Upper Abdomen: A stable stable small cyst upper pole left kidney. No followup imaging is recommended. Tiny hypodensity in the liver parenchyma is too small to characterize but is statistically most likely benign. No followup imaging is recommended. Musculoskeletal: No worrisome lytic or sclerotic osseous abnormality. Advanced degenerative changes are seen in the left shoulder. IMPRESSION: 1. New patchy airspace disease in the posterior right lung, likely infectious/inflammatory. Close attention on follow-up recommended. 2. Stable volume loss and scarring in the suprahilar right lung and parahilar right lung compatible with scarring from radiation therapy. 3. Stable loculated pleural fluid in the  posterior right apex and stable chronic fluid/pleural thickening in the posterior right lower hemithorax. 4. 4.3 cm diameter ascending thoracic aorta. Recommend annual imaging followup by CTA or MRA. This recommendation follows 2010 ACCF/AHA/AATS/ACR/ASA/SCA/SCAI/SIR/STS/SVM Guidelines for the Diagnosis and Management of Patients with Thoracic Aortic Disease. Circulation. 2010; 121: W098-J191. Aortic aneurysm NOS (ICD10-I71.9) 5. Aortic Atherosclerois (ICD10-170.0) Electronically Signed   By: Kennith Center M.D.   On: 10/15/2023 06:07     ASSESSMENT AND PLAN: This is a very pleasant 78 years old white male with a stage IIb/IIIa non-small cell lung cancer, squamous cell carcinoma diagnosed in July 2019. The patient is currently undergoing a course of concurrent chemoradiation with weekly carboplatin and paclitaxel status post 5 cycles.  He had partial response after the initial induction  treatment. The patient was started on treatment with consolidation Imfinzi status post 18 cycles.  The patient has been tolerating his treatment well except for the skin rash and itching.  He reaches the point that he could not sleep from the itching. He completed 18 cycles of this treatment.  The patient has a lot of other comorbidities especially the recent diagnosis with congestive heart failure. The patient is currently on observation and he is feeling fine. He had repeat CT scan of the chest performed recently.  I personally and independently reviewed the scan images and discussed the result with the patient and his wife.  His scan showed no concerning findings for disease recurrence or metastasis but there was new patchy airspace disease in the right lung likely infectious/inflammatory.    Stage IIIA non-small cell lung cancer, squamous cell carcinoma Diagnosed in July 2019, status post concurrent chemoradiation and 18 cycles of durvalumab. Currently under observation with no new concerning findings on recent CT  scan, aside from right lung inflammation. Continued vigilance is necessary due to the potential for recurrence.  Right lung inflammation Persistent inflammation in the right lung noted on CT scan, following suspected pneumonia in January. Completed a course of amoxicillin and azithromycin, but symptoms of congestion and cough persist. No fever or chills reported. Concern for recurrence or other complications due to stage IIIA non-small cell lung cancer, squamous cell carcinoma, necessitating close monitoring. A week of doxycycline is prescribed to address the inflammation, with plans to reassess via CT scan in three months to ensure resolution and rule out malignancy. - Prescribe doxycycline for 1 week, to be taken twice daily. - Repeat CT scan in 3 months to assess resolution of inflammation.   The patient was advised to call immediately if she has any other concerning symptoms in the interval. The patient voices understanding of current disease status and treatment options and is in agreement with the current care plan. All questions were answered. The patient knows to call the clinic with any problems, questions or concerns. We can certainly see the patient much sooner if necessary.  Disclaimer: This note was dictated with voice recognition software. Similar sounding words can inadvertently be transcribed and may not be corrected upon review.

## 2023-10-24 ENCOUNTER — Encounter: Payer: Self-pay | Admitting: Internal Medicine

## 2024-01-09 ENCOUNTER — Ambulatory Visit (HOSPITAL_COMMUNITY)
Admission: RE | Admit: 2024-01-09 | Discharge: 2024-01-09 | Disposition: A | Discharge status: Home / Self Care | Source: Ambulatory Visit | Attending: Internal Medicine | Admitting: Internal Medicine

## 2024-01-09 ENCOUNTER — Inpatient Hospital Stay: Payer: Self-pay | Attending: Internal Medicine

## 2024-01-09 DIAGNOSIS — C3411 Malignant neoplasm of upper lobe, right bronchus or lung: Secondary | ICD-10-CM | POA: Insufficient documentation

## 2024-01-09 DIAGNOSIS — C3490 Malignant neoplasm of unspecified part of unspecified bronchus or lung: Secondary | ICD-10-CM | POA: Insufficient documentation

## 2024-01-09 LAB — CMP (CANCER CENTER ONLY)
ALT: 10 U/L (ref 0–44)
AST: 18 U/L (ref 15–41)
Albumin: 3.9 g/dL (ref 3.5–5.0)
Alkaline Phosphatase: 43 U/L (ref 38–126)
Anion gap: 7 (ref 5–15)
BUN: 42 mg/dL — ABNORMAL HIGH (ref 8–23)
CO2: 25 mmol/L (ref 22–32)
Calcium: 9.4 mg/dL (ref 8.9–10.3)
Chloride: 105 mmol/L (ref 98–111)
Creatinine: 1.44 mg/dL — ABNORMAL HIGH (ref 0.61–1.24)
GFR, Estimated: 51 mL/min — ABNORMAL LOW (ref >60)
Glucose, Bld: 135 mg/dL — ABNORMAL HIGH (ref 70–99)
Potassium: 3.9 mmol/L (ref 3.5–5.1)
Sodium: 137 mmol/L (ref 135–145)
Total Bilirubin: 0.6 mg/dL (ref 0.0–1.2)
Total Protein: 7 g/dL (ref 6.5–8.1)

## 2024-01-09 LAB — CBC WITH DIFFERENTIAL (CANCER CENTER ONLY)
Abs Immature Granulocytes: 0.01 10*3/uL (ref 0.00–0.07)
Basophils Absolute: 0.1 10*3/uL (ref 0.0–0.1)
Basophils Relative: 2 %
Eosinophils Absolute: 0.2 10*3/uL (ref 0.0–0.5)
Eosinophils Relative: 4 %
HCT: 38.9 % — ABNORMAL LOW (ref 39.0–52.0)
Hemoglobin: 13.1 g/dL (ref 13.0–17.0)
Immature Granulocytes: 0 %
Lymphocytes Relative: 18 %
Lymphs Abs: 0.7 10*3/uL (ref 0.7–4.0)
MCH: 30 pg (ref 26.0–34.0)
MCHC: 33.7 g/dL (ref 30.0–36.0)
MCV: 89 fL (ref 80.0–100.0)
Monocytes Absolute: 0.4 10*3/uL (ref 0.1–1.0)
Monocytes Relative: 9 %
Neutro Abs: 2.8 10*3/uL (ref 1.7–7.7)
Neutrophils Relative %: 67 %
Platelet Count: 189 10*3/uL (ref 150–400)
RBC: 4.37 MIL/uL (ref 4.22–5.81)
RDW: 14.6 % (ref 11.5–15.5)
WBC Count: 4.1 10*3/uL (ref 4.0–10.5)
nRBC: 0 % (ref 0.0–0.2)

## 2024-01-15 NOTE — Assessment & Plan Note (Signed)
 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  First dose given on 05/17/2018.  Status post 18 cycles discontinued secondary to intolerance.  -currently on surveillance

## 2024-01-16 ENCOUNTER — Encounter: Payer: Self-pay | Admitting: Internal Medicine

## 2024-01-16 ENCOUNTER — Encounter: Payer: Self-pay | Admitting: Hematology

## 2024-01-16 ENCOUNTER — Inpatient Hospital Stay: Attending: Internal Medicine | Admitting: Hematology

## 2024-01-16 VITALS — BP 159/88 | HR 73 | Temp 97.4°F | Resp 17 | Ht 68.0 in | Wt 186.0 lb

## 2024-01-16 DIAGNOSIS — Z85118 Personal history of other malignant neoplasm of bronchus and lung: Secondary | ICD-10-CM | POA: Diagnosis present

## 2024-01-16 DIAGNOSIS — R053 Chronic cough: Secondary | ICD-10-CM | POA: Insufficient documentation

## 2024-01-16 DIAGNOSIS — Z923 Personal history of irradiation: Secondary | ICD-10-CM | POA: Insufficient documentation

## 2024-01-16 DIAGNOSIS — N189 Chronic kidney disease, unspecified: Secondary | ICD-10-CM | POA: Insufficient documentation

## 2024-01-16 DIAGNOSIS — Z9221 Personal history of antineoplastic chemotherapy: Secondary | ICD-10-CM | POA: Diagnosis not present

## 2024-01-16 DIAGNOSIS — C3411 Malignant neoplasm of upper lobe, right bronchus or lung: Secondary | ICD-10-CM | POA: Diagnosis not present

## 2024-01-16 NOTE — Progress Notes (Signed)
 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  First dose given on 05/17/2018.  Status post 18 cycles discontinued secondary to intolerance.  -currently on surveillance    Assessment & Plan Non-small cell lung cancer Diagnosed in 2019, treated with chemotherapy, radiation, and immunotherapy. Treatment was stopped early due to tolerance issues. Recent CT scan shows improvement in inflammatory changes in the right lung, with no evidence of cancer recurrence.  -He is 6 years out from initial diagnosis, risk of recurrence is very small now - Schedule follow-up with Dr. Deatrice in one year - Advise to contact the clinic if any breathing-related issues arise or if a CT scan at the emergency room shows concerning findings  Chronic cough Intermittent cough with congestion, likely related to post-radiation inflammation and previous COPD. Sputum occasionally light pink, green, or yellow. No fever reported. Cough may be chronic due to previous lung cancer treatment. - Continue Mucinex and cough syrup as needed - Follow up with pulmonologist as needed  Chronic kidney disease, unspecified Chronic kidney disease present but improving, likely due to resolved diabetes mellitus. Kidney function is better compared to previous assessments.  Diabetes mellitus,  resolved Diabetes mellitus resolved over a year ago following significant weight loss of 100 pounds. No longer on insulin . - Maintain current lifestyle changes, including diet and exercise   Plan - I personally reviewed his CT scan images with patient and his wife, previous to right lower lobe inflammation has resolved, no other new changes. - Continue cancer surveillance, follow-up with Dr. Sherrod in 1 year.  SUMMARY OF ONCOLOGIC HISTORY: Oncology History Overview Note  Patient presented with hemoptysis to ED while working in [facility].  Work up showed right lung mass.    Stage III squamous cell cancer  01/31/2018 Imaging   CXR/CT Chest/Bronchoscopy in [facility]   01/31/2018 Pathology Results   NSCLC Squamous cell    02/07/2018 Initial Diagnosis   Stage III squamous cell carcinoma of right lung (HCC)   02/16/2018 Imaging   PET IMPRESSION: 1. There is intense FDG uptake associated with the right hilar lung mass which obstructs the right upper lobe bronchus. This results in mild postobstructive pneumonitis in the right upper lobe. 2. Small right paratracheal lymph node demonstrates mild FDG uptake with an SUV max of 3.48, equivocal for metastatic disease. No evidence for distant metastatic disease.   02/27/2018 - 04/10/2018 Chemotherapy   The patient had palonosetron  (ALOXI ) injection 0.25 mg, 0.25 mg, Intravenous,  Once, 6 of 6 cycles Administration: 0.25 mg (02/27/2018), 0.25 mg (03/27/2018), 0.25 mg (03/06/2018), 0.25 mg (03/14/2018), 0.25 mg (03/20/2018) CARBOplatin  (PARAPLATIN ) 180 mg in sodium chloride  0.9 % 250 mL chemo infusion, 180 mg (100 % of original dose 182 mg), Intravenous,  Once, 6 of 6 cycles Dose modification: 182 mg (original dose 182 mg, Cycle 1), 210 mg (original dose 182 mg, Cycle 6, Reason:  Change in SCr/CrCl) Administration: 180 mg (02/27/2018), 180 mg (03/27/2018), 180 mg (03/06/2018), 180 mg (03/14/2018), 180 mg (03/20/2018) PACLitaxel  (TAXOL ) 108 mg in sodium chloride  0.9 % 250 mL  chemo infusion (</= 80mg /m2), 45 mg/m2 = 108 mg, Intravenous,  Once, 6 of 6 cycles Administration: 108 mg (02/27/2018), 108 mg (03/27/2018), 108 mg (03/06/2018), 108 mg (03/14/2018), 108 mg (03/20/2018)  for chemotherapy treatment.    05/17/2018 - 02/19/2019 Chemotherapy   Patient is on Treatment Plan : LUNG DURVALUMAB  [id number]        Discussed the use of AI scribe software for clinical note transcription with the patient, who gave verbal consent to proceed.  History of Present Illness The patient is a 78 year old with non-small cell lung cancer who presents for follow-up.  He was diagnosed with non-small cell lung cancer in July 2019 and received chemotherapy, radiation, and immunotherapy. Immunotherapy was discontinued due to tolerance issues, but the treatment was effective. Recent imaging showed inflammatory changes in the right lung, initially concerning for pneumonia. He was treated with antibiotics several months prior to April 2025. A recent CT scan from seven days ago shows improvement with a smaller area of inflammation.  He experiences a persistent cough with congestion that fluctuates in severity. The cough occasionally produces sputum, which is light pink, green, or yellow. No fever is present. He manages the cough with Mucinex and ribotoxin cough syrup as needed.  He has a history of smoking but quit approximately 35 years ago. He maintains an active lifestyle, going to the gym daily.     All other systems were reviewed with the patient and are negative.  MEDICAL HISTORY:  Past Medical History:  Diagnosis Date   Aortic atherosclerosis (HCC) 07/18/2018   Cellulitis of right lower extremity    Chronic kidney disease    Diabetes mellitus without complication (HCC)    Dyslipidemia    Hypertension    Prostate CA (HCC) dx'd 2012   rt lung ca dx'd 01/2018   Thoracic aortic aneurysm without rupture (HCC) 07/18/2018    SURGICAL HISTORY: Past Surgical History:  Procedure Laterality Date    IR CHEST FLUORO  01/31/2023   IR GUIDED DRAIN W CATHETER PLACEMENT  11/15/2018   IR GUIDED DRAIN W CATHETER PLACEMENT  01/31/2019   IR INSTILL VIA CHEST TUBE AGENT FOR FIBRINOLYSIS INI DAY  01/23/2019   IR REMOVAL OF PLURAL CATH W/CUFF  01/31/2019   IR REMOVAL OF PLURAL CATH W/CUFF  05/08/2019   IR REMOVAL TUN ACCESS W/ PORT W/O FL MOD SED  01/31/2023   IR THORACENTESIS ASP PLEURAL SPACE W/IMG GUIDE  09/05/2018   IR THORACENTESIS ASP PLEURAL SPACE W/IMG GUIDE  10/11/2018   IR THORACENTESIS ASP PLEURAL SPACE W/IMG GUIDE  10/30/2018   PORTACATH PLACEMENT Left 02/21/2018   Procedure: INSERTION PORT-A-CATH;  Surgeon: Army Dallas NOVAK, MD;  Location: [facility] OR;  Service: Thoracic;  Laterality: Left;    I have reviewed the social history and family history with the patient and they are unchanged from previous note.  ALLERGIES:  is allergic to lisinopril.  MEDICATIONS:  Current Outpatient Medications  Medication Sig Dispense Refill   albuterol  (PROVENTIL  HFA;VENTOLIN  HFA) 108 (90 Base) MCG/ACT inhaler Inhale 2 puffs into the lungs every 4 (four) hours as needed for wheezing or shortness of breath. 1 Inhaler 5   Ascorbic Acid (VITAMIN C) 100 MG tablet Take 1 tablet by mouth daily.     ASPIRIN LOW DOSE 81 MG EC tablet Take 81 mg by  mouth daily.     carvedilol  (COREG ) 3.125 MG tablet Take 3.125 mg by mouth 2 (two) times daily.     Cholecalciferol (VITAMIN D3) 10 MCG (400 UNIT) tablet Take by mouth.     doxycycline  (VIBRA -TABS) 100 MG tablet Take 1 tablet (100 mg total) by mouth 2 (two) times daily. 14 tablet 0   fenofibrate  160 MG tablet Take 160 mg by mouth daily.      ferrous sulfate 325 (65 FE) MG tablet Take by mouth.     furosemide  (LASIX ) 20 MG tablet Take 20 mg by mouth daily. Take an additional tablet if weight is  up more than 3 lbs in a day or 5 lbs in 2 days.     hydrOXYzine  (ATARAX /VISTARIL ) 10 MG tablet Take 1 tablet (10 mg total) by mouth 3 (three) times daily as needed. 30 tablet 0   insulin   detemir (LEVEMIR ) 100 UNIT/ML FlexPen Inject 10 Units into the skin daily.      Insulin  Pen Needle 31G X 8 MM MISC USE AS DIRECTED     lidocaine -prilocaine  (EMLA ) cream Apply 1 application topically as needed. 30 g 0   losartan  (COZAAR ) 50 MG tablet Take by mouth.     MULTIPLE VITAMINS ESSENTIAL PO Take 1 tablet by mouth daily. With zinc     Omega-3 1000 MG CAPS Take 1,200 mg by mouth 2 (two) times daily.      omeprazole (PRILOSEC) 40 MG capsule Take 40 mg by mouth 2 (two) times daily.     ONETOUCH ULTRA test strip daily.     polyethylene glycol (MIRALAX  / GLYCOLAX ) 17 g packet Take 17 g by mouth daily. 14 each 0   pravastatin  (PRAVACHOL ) 40 MG tablet Take 40 mg by mouth daily.      senna-docusate (SENOKOT-S) 8.6-50 MG tablet Take 2 tablets by mouth 2 (two) times daily.     sildenafil (VIAGRA) 50 MG tablet Take by mouth.     TRADJENTA 5 MG TABS tablet Take 5 mg by mouth daily.     zinc gluconate 50 MG tablet Take by mouth.     No current facility-administered medications for this visit.   Facility-Administered Medications Ordered in Other Visits  Medication Dose Route Frequency Provider Last Rate Last Admin   sodium chloride  flush (NS) 0.9 % injection 10 mL  10 mL Intracatheter PRN Sherrod Sherrod, MD   10 mL at 06/28/18 1501    PHYSICAL EXAMINATION: ECOG PERFORMANCE STATUS: 0 - Asymptomatic  Vitals:   01/16/24 0951 01/16/24 0953  BP: (!) 171/97 (!) 159/88  Pulse: 73   Resp: 17   Temp: (!) 97.4 F (36.3 C)   SpO2: 100%    Wt Readings from Last 3 Encounters:  01/16/24 186 lb (84.4 kg)  10/17/23 185 lb 12.8 oz (84.3 kg)  01/20/23 178 lb 9.6 oz (81 kg)     GENERAL:alert, no distress and comfortable SKIN: skin color, texture, turgor are normal, no rashes or significant lesions EYES: normal, Conjunctiva are pink and non-injected, sclera clear Musculoskeletal:no cyanosis of digits and no clubbing  NEURO: alert & oriented x 3 with fluent speech, no focal motor/sensory  deficits  Physical Exam    LABORATORY DATA:  I have reviewed the data as listed    Latest Ref Rng & Units 01/09/2024    8:11 AM 10/13/2023    9:42 AM 01/20/2023    8:23 AM  CBC  WBC 4.0 - 10.5 K/uL 4.1  5.0  4.1   Hemoglobin  13.0 - 17.0 g/dL 86.8  87.1  85.9   Hematocrit 39.0 - 52.0 % 38.9  37.9  41.5   Platelets 150 - 400 K/uL 189  198  155         Latest Ref Rng & Units 01/09/2024    8:11 AM 10/13/2023    9:42 AM 01/20/2023    8:23 AM  CMP  Glucose 70 - 99 mg/dL 864  860  873   BUN 8 - 23 mg/dL 42  56  56   Creatinine 0.61 - 1.24 mg/dL 8.55  8.01  8.41   Sodium 135 - 145 mmol/L 137  135  137   Potassium 3.5 - 5.1 mmol/L 3.9  3.9  3.8   Chloride 98 - 111 mmol/L 105  102  102   CO2 22 - 32 mmol/L 25  27  28    Calcium 8.9 - 10.3 mg/dL 9.4  9.6  89.9   Total Protein 6.5 - 8.1 g/dL 7.0  7.4  7.3   Total Bilirubin 0.0 - 1.2 mg/dL 0.6  0.7  0.9   Alkaline Phos 38 - 126 U/L 43  46  46   AST 15 - 41 U/L 18  17  28    ALT 0 - 44 U/L 10  9  19        RADIOGRAPHIC STUDIES: I have personally reviewed the radiological images as listed and agreed with the findings in the report. No results found.    No orders of the defined types were placed in this encounter.  All questions were answered. The patient knows to call the clinic with any problems, questions or concerns. No barriers to learning was detected. The total time spent in the appointment was 25 minutes, including review of chart and various tests results, discussions about plan of care and coordination of care plan     Onita Mattock, MD 01/16/2024

## 2024-01-19 ENCOUNTER — Telehealth: Payer: Self-pay | Admitting: Internal Medicine

## 2024-01-19 NOTE — Telephone Encounter (Signed)
 Scheduled appointments per 7/7 los. Talked with the patient and he is aware of the made appointments.

## 2024-02-03 ENCOUNTER — Telehealth: Payer: Self-pay

## 2024-02-03 NOTE — Telephone Encounter (Signed)
 Patient called and left a voicemail stating that he needs a renewal for his handicap parking sticker. Message sent to Dr. Sherrod for review.

## 2024-02-07 ENCOUNTER — Encounter: Payer: Self-pay | Admitting: Internal Medicine

## 2024-02-07 NOTE — Telephone Encounter (Signed)
 Spoke with patient regarding the handicap parking sticker form. Patient requested that the form be mailed to his home. Informed him that I would place it in the mail today. He voiced thanks.

## 2025-01-09 ENCOUNTER — Other Ambulatory Visit

## 2025-01-09 ENCOUNTER — Ambulatory Visit: Admitting: Internal Medicine
